# Patient Record
Sex: Female | Born: 1935 | Race: Black or African American | Hispanic: No | Marital: Married | Smoking: Former smoker
Health system: Southern US, Community
[De-identification: ages and names within clinical notes are randomized; demographics above are authoritative.]

## PROBLEM LIST (undated history)

## (undated) DIAGNOSIS — C259 Malignant neoplasm of pancreas, unspecified: Secondary | ICD-10-CM

## (undated) DIAGNOSIS — K831 Obstruction of bile duct: Secondary | ICD-10-CM

## (undated) DIAGNOSIS — E079 Disorder of thyroid, unspecified: Secondary | ICD-10-CM

## (undated) DIAGNOSIS — E785 Hyperlipidemia, unspecified: Secondary | ICD-10-CM

## (undated) DIAGNOSIS — C801 Malignant (primary) neoplasm, unspecified: Secondary | ICD-10-CM

## (undated) DIAGNOSIS — M1990 Unspecified osteoarthritis, unspecified site: Secondary | ICD-10-CM

## (undated) DIAGNOSIS — K859 Acute pancreatitis without necrosis or infection, unspecified: Secondary | ICD-10-CM

## (undated) DIAGNOSIS — R011 Cardiac murmur, unspecified: Secondary | ICD-10-CM

## (undated) DIAGNOSIS — J45909 Unspecified asthma, uncomplicated: Secondary | ICD-10-CM

## (undated) DIAGNOSIS — I10 Essential (primary) hypertension: Secondary | ICD-10-CM

## (undated) DIAGNOSIS — K219 Gastro-esophageal reflux disease without esophagitis: Secondary | ICD-10-CM

## (undated) HISTORY — DX: Cardiac murmur, unspecified: R01.1

## (undated) HISTORY — DX: Essential (primary) hypertension: I10

## (undated) HISTORY — DX: Unspecified osteoarthritis, unspecified site: M19.90

## (undated) HISTORY — PX: CATARACT EXTRACTION: SUR2

## (undated) HISTORY — DX: Gastro-esophageal reflux disease without esophagitis: K21.9

## (undated) HISTORY — DX: Disorder of thyroid, unspecified: E07.9

## (undated) HISTORY — PX: OTHER SURGICAL HISTORY: SHX169

## (undated) HISTORY — DX: Hyperlipidemia, unspecified: E78.5

## (undated) HISTORY — DX: Malignant (primary) neoplasm, unspecified: C80.1

## (undated) HISTORY — PX: CHOLECYSTECTOMY: SHX55

## (undated) HISTORY — PX: ABDOMINAL HYSTERECTOMY: SHX81

## (undated) HISTORY — PX: NECK SURGERY: SHX720

---

## 1999-04-13 ENCOUNTER — Emergency Department (HOSPITAL_COMMUNITY): Admission: EM | Admit: 1999-04-13 | Discharge: 1999-04-13 | Payer: Self-pay | Admitting: Emergency Medicine

## 2000-11-04 ENCOUNTER — Ambulatory Visit (HOSPITAL_COMMUNITY): Admission: RE | Admit: 2000-11-04 | Discharge: 2000-11-04 | Payer: Self-pay | Admitting: Gastroenterology

## 2000-11-04 ENCOUNTER — Encounter: Payer: Self-pay | Admitting: Gastroenterology

## 2000-11-08 ENCOUNTER — Other Ambulatory Visit: Admission: RE | Admit: 2000-11-08 | Discharge: 2000-11-08 | Payer: Self-pay | Admitting: Gastroenterology

## 2000-11-08 ENCOUNTER — Encounter (INDEPENDENT_AMBULATORY_CARE_PROVIDER_SITE_OTHER): Payer: Self-pay

## 2000-11-09 ENCOUNTER — Encounter: Payer: Self-pay | Admitting: Gastroenterology

## 2000-11-09 ENCOUNTER — Ambulatory Visit (HOSPITAL_COMMUNITY): Admission: RE | Admit: 2000-11-09 | Discharge: 2000-11-09 | Payer: Self-pay | Admitting: Gastroenterology

## 2000-12-17 ENCOUNTER — Emergency Department (HOSPITAL_COMMUNITY): Admission: EM | Admit: 2000-12-17 | Discharge: 2000-12-17 | Payer: Self-pay | Admitting: Emergency Medicine

## 2000-12-17 ENCOUNTER — Encounter: Payer: Self-pay | Admitting: Family Medicine

## 2001-12-02 ENCOUNTER — Ambulatory Visit (HOSPITAL_COMMUNITY): Admission: RE | Admit: 2001-12-02 | Discharge: 2001-12-02 | Payer: Self-pay | Admitting: Gastroenterology

## 2001-12-09 ENCOUNTER — Ambulatory Visit (HOSPITAL_COMMUNITY): Admission: RE | Admit: 2001-12-09 | Discharge: 2001-12-09 | Payer: Self-pay | Admitting: Gastroenterology

## 2001-12-09 ENCOUNTER — Encounter: Payer: Self-pay | Admitting: Gastroenterology

## 2003-02-21 ENCOUNTER — Ambulatory Visit: Admission: RE | Admit: 2003-02-21 | Discharge: 2003-02-21 | Payer: Self-pay | Admitting: Family Medicine

## 2003-07-23 ENCOUNTER — Encounter: Payer: Self-pay | Admitting: Neurosurgery

## 2003-07-23 ENCOUNTER — Encounter: Admission: RE | Admit: 2003-07-23 | Discharge: 2003-07-23 | Payer: Self-pay | Admitting: Neurosurgery

## 2003-08-02 ENCOUNTER — Inpatient Hospital Stay (HOSPITAL_COMMUNITY): Admission: RE | Admit: 2003-08-02 | Discharge: 2003-08-04 | Payer: Self-pay | Admitting: Neurosurgery

## 2003-09-10 ENCOUNTER — Encounter (INDEPENDENT_AMBULATORY_CARE_PROVIDER_SITE_OTHER): Payer: Self-pay | Admitting: *Deleted

## 2003-09-10 ENCOUNTER — Ambulatory Visit (HOSPITAL_COMMUNITY): Admission: RE | Admit: 2003-09-10 | Discharge: 2003-09-10 | Payer: Self-pay | Admitting: Internal Medicine

## 2004-01-07 ENCOUNTER — Encounter: Admission: RE | Admit: 2004-01-07 | Discharge: 2004-01-07 | Payer: Self-pay | Admitting: Internal Medicine

## 2005-02-27 ENCOUNTER — Encounter: Admission: RE | Admit: 2005-02-27 | Discharge: 2005-02-27 | Payer: Self-pay | Admitting: Endocrinology

## 2005-09-24 ENCOUNTER — Encounter: Admission: RE | Admit: 2005-09-24 | Discharge: 2005-09-24 | Payer: Self-pay | Admitting: Internal Medicine

## 2005-11-30 ENCOUNTER — Emergency Department (HOSPITAL_COMMUNITY): Admission: EM | Admit: 2005-11-30 | Discharge: 2005-11-30 | Payer: Self-pay | Admitting: Emergency Medicine

## 2006-05-13 ENCOUNTER — Inpatient Hospital Stay (HOSPITAL_COMMUNITY): Admission: RE | Admit: 2006-05-13 | Discharge: 2006-05-17 | Payer: Self-pay | Admitting: Neurosurgery

## 2007-01-07 ENCOUNTER — Inpatient Hospital Stay (HOSPITAL_COMMUNITY): Admission: AD | Admit: 2007-01-07 | Discharge: 2007-01-10 | Payer: Self-pay | Admitting: Internal Medicine

## 2007-01-08 ENCOUNTER — Encounter (INDEPENDENT_AMBULATORY_CARE_PROVIDER_SITE_OTHER): Payer: Self-pay | Admitting: Specialist

## 2007-03-03 ENCOUNTER — Encounter: Admission: RE | Admit: 2007-03-03 | Discharge: 2007-03-03 | Payer: Self-pay | Admitting: Internal Medicine

## 2007-03-09 ENCOUNTER — Encounter: Admission: RE | Admit: 2007-03-09 | Discharge: 2007-03-09 | Payer: Self-pay | Admitting: Internal Medicine

## 2007-07-26 ENCOUNTER — Encounter: Admission: RE | Admit: 2007-07-26 | Discharge: 2007-10-24 | Payer: Self-pay | Admitting: Internal Medicine

## 2008-01-03 ENCOUNTER — Emergency Department (HOSPITAL_COMMUNITY): Admission: EM | Admit: 2008-01-03 | Discharge: 2008-01-03 | Payer: Self-pay | Admitting: Emergency Medicine

## 2008-03-25 ENCOUNTER — Encounter: Admission: RE | Admit: 2008-03-25 | Discharge: 2008-03-25 | Payer: Self-pay | Admitting: Neurosurgery

## 2008-07-05 ENCOUNTER — Encounter: Admission: RE | Admit: 2008-07-05 | Discharge: 2008-07-05 | Payer: Self-pay | Admitting: Internal Medicine

## 2008-10-05 HISTORY — PX: HAND SURGERY: SHX662

## 2009-03-07 ENCOUNTER — Ambulatory Visit: Payer: Self-pay | Admitting: Surgery

## 2009-05-16 ENCOUNTER — Ambulatory Visit: Payer: Self-pay | Admitting: Surgery

## 2009-05-26 ENCOUNTER — Encounter: Admission: RE | Admit: 2009-05-26 | Discharge: 2009-05-26 | Payer: Self-pay | Admitting: Orthopedic Surgery

## 2009-06-06 ENCOUNTER — Ambulatory Visit (HOSPITAL_BASED_OUTPATIENT_CLINIC_OR_DEPARTMENT_OTHER): Admission: RE | Admit: 2009-06-06 | Discharge: 2009-06-06 | Payer: Self-pay | Admitting: Orthopedic Surgery

## 2009-08-01 ENCOUNTER — Encounter: Admission: RE | Admit: 2009-08-01 | Discharge: 2009-08-01 | Payer: Self-pay | Admitting: Internal Medicine

## 2009-11-14 ENCOUNTER — Encounter: Admission: RE | Admit: 2009-11-14 | Discharge: 2009-11-14 | Payer: Self-pay | Admitting: Surgery

## 2009-11-14 ENCOUNTER — Other Ambulatory Visit: Admission: RE | Admit: 2009-11-14 | Discharge: 2009-11-14 | Payer: Self-pay | Admitting: Interventional Radiology

## 2010-03-06 ENCOUNTER — Emergency Department (HOSPITAL_COMMUNITY): Admission: EM | Admit: 2010-03-06 | Discharge: 2010-03-06 | Payer: Self-pay | Admitting: Emergency Medicine

## 2010-06-02 ENCOUNTER — Encounter: Admission: RE | Admit: 2010-06-02 | Discharge: 2010-06-02 | Payer: Self-pay | Admitting: Surgery

## 2010-06-13 ENCOUNTER — Encounter: Admission: RE | Admit: 2010-06-13 | Discharge: 2010-06-13 | Payer: Self-pay | Admitting: Gastroenterology

## 2010-08-04 ENCOUNTER — Encounter: Admission: RE | Admit: 2010-08-04 | Discharge: 2010-08-04 | Payer: Self-pay | Admitting: Internal Medicine

## 2010-10-26 ENCOUNTER — Encounter: Payer: Self-pay | Admitting: Internal Medicine

## 2011-01-09 LAB — POCT HEMOGLOBIN-HEMACUE: Hemoglobin: 12.5 g/dL (ref 12.0–15.0)

## 2011-01-10 LAB — BASIC METABOLIC PANEL
BUN: 17 mg/dL (ref 6–23)
CO2: 28 mEq/L (ref 19–32)
Calcium: 9.1 mg/dL (ref 8.4–10.5)
Chloride: 106 mEq/L (ref 96–112)
Creatinine, Ser: 1.04 mg/dL (ref 0.4–1.2)
GFR calc Af Amer: >60 mL/min (ref >60)
GFR calc non Af Amer: 52 mL/min — ABNORMAL LOW (ref >60)
Glucose, Bld: 99 mg/dL (ref 70–99)
Potassium: 3.6 mEq/L (ref 3.5–5.1)
Sodium: 141 mEq/L (ref 135–145)

## 2011-02-17 NOTE — Procedures (Signed)
DUPLEX DEEP VENOUS EXAM - LOWER EXTREMITY   INDICATION:  Left lower extremity pain with localized lump.   HISTORY:  Edema:  Yes.  Trauma/Surgery:  No.  Pain:  Yes.  PE:  No.  Previous DVT:  No.  Anticoagulants:  Other:   DUPLEX EXAM:                CFV   SFV   PopV  PTV    GSV                R  L  R  L  R  L  R   L  R  L  Thrombosis    0  0     0     0      0     0  Spontaneous   +  +  +  +  +  +  +   +  +  +  Phasic        +  +  +  +  +  +  +   +  +  +  Augmentation  +  +  +  +  +  +  +   +  +  +  Compressible  +  +  +  +  +  +  +   +  +  +  Competent     +  +  +  +  +  +  +   +  +  +   Legend:  + - yes  o - no  p - partial  D - decreased    IMPRESSION:  1. No evidence of DVT noted in the left leg.  2. Questionable cyst that measured 1.68 cm x 2.24 cm noted in the      posterior aspect of the left medial malleolus.  3. Notify nurse with results.       _____________________________  V. Charlena Cross, MD   MG/MEDQ  D:  03/07/2009  T:  03/07/2009  Job:  [id number]

## 2011-02-17 NOTE — Op Note (Signed)
NAME:  Carolyn [name], Carolyn [name]                ACCOUNT NO.:  [id number]   MEDICAL RECORD NO.:  [id number]          PATIENT TYPE:  AMB   LOCATION:  DSC                          FACILITY:  [facility]   PHYSICIAN:  Carolyn [name], M.D. DATE OF BIRTH:  [birth year]   DATE OF PROCEDURE:  06/06/2009  DATE OF DISCHARGE:                               OPERATIVE REPORT   PREOPERATIVE DIAGNOSES:  Chronic entrapment neuropathy, left median  nerve carpal tunnel.   POSTOPERATIVE DIAGNOSES:  Chronic entrapment neuropathy, left median  nerve carpal tunnel.   OPERATION:  Release of left transverse carpal ligament.   OPERATIONS:  Carolyn Fitch. Sypher, MD   ASSISTANT:  Carolyn Reeks Dasnoit, PA-C   ANESTHESIA:  General by LMA.   SUPERVISING ANESTHESIOLOGIST:  Carolyn Hora. Gelene Mink, MD   INDICATIONS:  Carolyn [name] is a 75 year old woman referred through the  courtesy of Dr. Creola [name] for evaluation of left arm pain, swelling,  and numbness.  Clinical examination revealed signs of a rotator cuff  syndrome, chronic edema of the arm, and probable carpal tunnel syndrome.   She has been thoroughly evaluated by Dr. Timothy [name] and has multiple medical  problems that are under his able management.  She was sent for a Doppler  study of her left upper extremity to rule out possible axillary vein  thrombosis.  This study was negative for signs of venous outlet  obstruction.  Dr. Timothy [name] and I had a lengthy discussion regarding her  continued arm swelling and today we have as yet not identified a clear  syndrome to explain this other than possible chronic carpal tunnel  syndrome with a neuropathic response leading to edema.  She was studied  by Dr. Floreen [name] with electrodiagnostic studies which did confirm  significant left carpal tunnel syndrome.   Due to failed respond to nonoperative measures, she is brought to the  operating at this time for release of her left transverse carpal  ligament.   PROCEDURE:  Carolyn [name] was brought to  the operating room and placed in  the supine position upon the operating table.   Following the induction of general anesthesia by LMA technique, the left  arm was prepped with Betadine soap and solution, sterilely draped.  A  pneumatic tourniquet was applied to proximal left brachium.  Following  exsanguination, the left arm with Esmarch bandage, arterial tourniquet  was inflated to 250 mmHg.  The procedure commenced with a short incision  in the line of the ring finger in the palm.  Subcutaneous tissues were  carefully divided revealing the palmar fascia.  This was split  longitudinally to reveal the common sensory branch of the median nerve.  These were followed back to the median nerve proper which was gently  isolated from the transverse carpal ligament with a Biochemist, clinical.  The transcarpal ligament was then released along its ulnar  border extending into the distal forearm.  This widely opened the carpal  canal.  No mass or other predicaments were noted.   Bleeding points along the margin of the released ligament were  electrocauterized with bipolar current  followed by repair of the skin  with intradermal 3-0 Prolene.   A compressive dressing was applied with a volar plaster splint  maintaining the wrist in 5 degrees of dorsiflexion.   For aftercare, Carolyn [name] is provided a prescription for Percocet 5 mg 1  p.o. q.4-6 h. p.r.n. pain 20 tablets without refill.  She will return to  see [place] in the office for followup in roughly 8 days for suture removal  and advancement to a therapy program.      Carolyn [name], M.D.  Electronically Signed     RVS/MEDQ  D:  06/06/2009  T:  06/06/2009  Job:  [id number]   cc:   Carolyn Pounds, MD

## 2011-02-17 NOTE — Procedures (Signed)
DUPLEX DEEP VENOUS EXAM - UPPER EXTREMITY   INDICATION:  Left arm pain and swelling.   HISTORY:  Edema:  Yes.  Trauma/Surgery:  No.  Pain:  Yes.  PE:  No.  Previous DVT:  No.  Anticoagulants:  None.  Other:   DUPLEX EXAM:                                             Bas/                IJV   SCV     AXV    BrachV  Ceph V                R  L  R   L   R  L   R   L   R  L  Thrombosis    0  0      0      0       0      0  Spontaneous      +      +      +       +      +  Phasic           +      +      +       +      +  Augmentation     +      +      +       +      +  Compressible     +      +      +       +      +  Competent        +      +      +       +      +  Legend:  + - yes  o - no  p - partial  D - decreased   IMPRESSION:  1. No evidence of DVT noted in the left arm.  2. Notified of Dr. Timothy Lasso with results.   ___________________________________________  V. Charlena Cross, MD   MG/MEDQ  D:  05/16/2009  T:  05/16/2009  Job:  [id number]

## 2011-02-20 NOTE — Discharge Summary (Signed)
NAME:  Carolyn [name], Carolyn [name]                ACCOUNT NO.:  [id number]   MEDICAL RECORD NO.:  [id number]          PATIENT TYPE:  INP   LOCATION:  3010                         FACILITY:  [facility]   PHYSICIAN:  Payton Doughty, M.D.      DATE OF BIRTH:  Feb 12, 1936   DATE OF ADMISSION:  05/13/2006  DATE OF DISCHARGE:  05/17/2006                                 DISCHARGE SUMMARY   ADMITTING DIAGNOSIS:  Cervical spondylosis.   DISCHARGE DIAGNOSIS:  Cervical spondylosis.   PROCEDURE:  C3 to C7 posterior cervical laminectomy and fusion.   DICTATING DOCTOR:  Dr. Channing Mutters.   HOSPITAL COURSE:  This is a 75 year old right-hand black lady whose history  and physical is recounted in the chart by Dr. Venetia Maxon.  She had an anterior  decompression done C4/5, had degenerative disease and progressive  myelopathy.  She was admitted after ascertaining normal laboratory values  and underwent a posterior cervical decompression and fusion C3 through C7.  Postoperatively,  she did reasonably well.  She had a drain placed and was  kept down in an ACU.  Her strength has been forcing a little bit of numbness  in her hands which I believe will resolve.  There is no evidence of a  Hoffman's.  The second postoperative day her drain was removed.  Third  postoperative day she was transferred to 3000.  She is up, eating and  voiding normally, independent in her activities of daily living.  She is  being discharged home in the care of her family with Percocet for pain,  Valium for spasm.  Her follow up will be in the [facility] office in a week.           ______________________________  Payton Doughty, M.D.     MWR/MEDQ  D:  05/17/2006  T:  05/17/2006  Job:  [id number]

## 2011-02-20 NOTE — Op Note (Signed)
NAME:  Carolyn [name], Carolyn [name]                ACCOUNT NO.:  [id number]   MEDICAL RECORD NO.:  [id number]          PATIENT TYPE:  INP   LOCATION:  5007                         FACILITY:  [facility]   PHYSICIAN:  John C. Madilyn Fireman, M.D.    DATE OF BIRTH:  [birth year]   DATE OF PROCEDURE:  01/08/2007  DATE OF DISCHARGE:                               OPERATIVE REPORT   PROCEDURE:  Esophagogastroduodenoscopy with biopsy.   INDICATIONS FOR PROCEDURE:  Epigastric abdominal pain, etiology unclear;  nonspecific elevation of amylase and lipase in a patient with previous  history of cholecystectomy and no abnormalities on abdominal CT scan.   PROCEDURE:  The patient was placed in the left lateral decubitus  position and placed on the pulse monitor with continuous low-flow oxygen  delivered by nasal cannula.  She was sedated 50 mcg IV fentanyl and 5 mg  IV Versed.  The Olympus video endoscope was advanced under direct vision  into the oropharynx and esophagus.  The esophagus was straight and of  normal caliber, with the squamocolumnar line at 38 cm.  There was 1 to 2-  cm small hiatal hernia without any visible inflammation, stricture, or  ring.  The stomach was entered, and a small amount of liquid secretions  were suctioned from the fundus.  Retroflexed view of the cardia  confirmed a hiatal hernia but was otherwise unremarkable.  The fundus  appeared normal.  In the proximal body along the greater curve, there  was seen an oblong, ovoid, 8-mm polypoid nodule that was cold biopsied.  The remainder of the stomach appeared normal.  The pylorus was  nondeformed and easily allowed passage of the endoscope tip in the  duodenum.  In the distal bulb there was an approximately 6-mm protruding  polypoid structure, relatively smooth-surfaced, which was biopsied and  sent in a separate specimen container.  This was felt possibly to  represent hyperplastic or adenomatous polyp or possibly ectopic pancreas  or Brunner's gland  hyperplasia.  The postbulbar duodenum appeared  normal.  The scope was then withdrawn.   The patient was returned to the recovery room in stable condition.  She  tolerated the procedure well.  There were no immediate complications.   IMPRESSION:  1. Gastric and duodenal nodule.  2. Hiatal hernia.  3. No evidence of peptic ulcer disease.   PLAN:  Will obtain a fasting lipid profile to rule out  hypertriglyceridemia as a possible cause of low-grade pancreatitis.           ______________________________  Everardo All. Madilyn Fireman, M.D.     JCH/MEDQ  D:  01/08/2007  T:  01/08/2007  Job:  [id number]   cc:   Gwen Pounds, MD

## 2011-02-20 NOTE — Consult Note (Signed)
NAME:  Carolyn [name], Carolyn [name]                ACCOUNT NO.:  [id number]   MEDICAL RECORD NO.:  [id number]          PATIENT TYPE:  INP   LOCATION:  5007                         FACILITY:  [facility]   PHYSICIAN:  John C. Madilyn Fireman, M.D.    DATE OF BIRTH:  [birth year]   DATE OF CONSULTATION:  01/07/2007  DATE OF DISCHARGE:                                 CONSULTATION   GASTROENTEROLOGY CONSULTATION   REASON FOR CONSULTATION:  Abdominal pain.   HISTORY OF ILLNESS:  The patient is a 75 year old, black female, who  presents with a three-week history of gradually worsening epigastric,  burning, abdominal pain despite being on Protonix and unrelieved by  taking more Zantac and antacids.  It is worse after eating, and she has  lost about 5 pounds of weight during this time.  She denies any nausea  or vomiting, black stools, fever, dysphagia, or heartburn, and she  denies using nonsteroidal antiinflammatory drugs.  She has a remote  history of cholecystectomy in the 1980s.  She does not drink alcohol.  She has not yet had any lab work or radiologic studies.  She describes  having an EGD done at sometime in the past by the [facility], but I  do not have the results.   PAST MEDICAL HISTORY:  1. Gastroesophageal reflux.  2. History of hypertension.  3. Cervical degenerative joint disease.  4. History of gallstones.  5. History of hyperlipidemia.  6. Multinodular goiter.  7. History of colon polyps.   MEDICATIONS:  Protonix, Vytorin, Lisinopril, Verapamil.   PAST SURGICAL HISTORY:  1. Pilonidal cyst removal.  2. Hysterectomy.  3. Cholecystectomy.  4. Cervical spinal fusion x2.   ALLERGIES:  None.   PHYSICAL EXAMINATION:  GENERAL:  A well-developed, well-nourished, black  female in no acute distress.  HEENT:  Unremarkable.  No obvious scleral icterus.  HEART:  Regular rate and rhythm without murmur.  LUNGS:  Clear.  ABDOMEN:  Soft and nondistended with normoactive bowel sounds.  No  hepatosplenomegaly, masses, or guarding.  There is mild epigastric  abdominal tenderness.   IMPRESSION:  Epigastric abdominal pain.  Unclear whether pancreatitis,  peptic ulcer disease, possible common bile duct stone, or other  hepatobiliary etiology.   PLAN:  I will begin workup with routine lab work including liver  function tests, amylase, lipase, as well as abdominal CT scan.  If these  are unrevealing, the next step would be EGD.           ______________________________  Everardo All. Madilyn Fireman, M.D.     JCH/MEDQ  D:  01/07/2007  T:  01/07/2007  Job:  [id number]

## 2011-02-20 NOTE — Discharge Summary (Signed)
NAME:  Carolyn [name], Carolyn [name] NO.:  [id number]   MEDICAL RECORD NO.:  [id number]          PATIENT TYPE:  INP   LOCATION:  5007                         FACILITY:  [facility]   PHYSICIAN:  Gwen Pounds, MD       DATE OF BIRTH:  07/23/1936   DATE OF ADMISSION:  01/07/2007  DATE OF DISCHARGE:  01/10/2007                               DISCHARGE SUMMARY   PRIMARY CARE [facility]:  Gwen Pounds, M.D.   GASTROENTEROLOGY:  Everardo All. Madilyn Fireman, M.D.   DISCHARGE DIAGNOSES:  1. Mild pancreatitis.  2. Severe abdominal pain.  3. Weight loss and anorexia.  4. Gastroesophageal reflux disease.  5. History of multinodular goiter.  6. Known carotid bruit with 41-59% left internal carotid artery      stenosis.  7. History of pilonidal cyst removal.  8. Hysterectomy secondary to fibroids.  9. History of bilateral carpal tunnel syndrome surgery.  10.Cholecystectomy in 1990 secondary to stones; this was done      laparoscopically.  11.Cervical C4-5 laminectomy in October 2004; C3-7 fusion in August      2007.  12.Hypertension.  13.Hyperlipidemia.  14.History of colon polypectomy.   DISCHARGE MEDICATION LIST:  1. Protonix 40 mg p.o. daily.  2. Vytorin 10/40 daily.  3. Lisinopril/hydrochlorothiazide 20/25 b.i.d.  4. Verapamil SR 120 daily.  5. Vicodin 5/500 one to two tablets p.o. q.6h. p.r.n. #40.   DISCHARGE DIET:  Small meals.  Can increase to three to five small meals  per day until she is feeling better.  If she gets constipated she is to  add stool softeners, MiraLax or Milk of Magnesia at this point.  Otherwise, maintain a low-fat diet, heart healthy into the future.   DISCHARGE PROCEDURES:  1. Esophagogastroduodenoscopy with biopsy revealing gastric and      duodenal nodule, hiatal hernia, and no evidence of peptic ulcer      disease.  Biopsy reports reveal hyperplastic pyloric-type mucosa      and benign fundic gland polyp.  2. Medical management and GI consultation.   HISTORY OF  PRESENT ILLNESS:  Briefly, [name] is a 75 year old  female with known history of GERD, history of polypectomy, and history  of laparoscopic cholecystectomy who presented to my office on January 07, 2007, with 3 weeks of severe epigastric and right upper quadrant pains.  When I went in to evaluate her, she was rolled up in a ball to relieve  the pain and the pressure.  The pain and pressure were so great that she  had not been sleeping, she was in tears.  She had been taking extra  doses of Protonix, Zantac, and Pepcid trying to alleviate some of the GI  issues that she had been having.  She denied any nausea, vomiting or  diarrhea but was anorexic and was not eating due to lack of hunger.  She  had the feeling that she had to belch but could not.  She did not have  any bloody bowel movements or melena.  On exam, she had 10/10 pain.  She  had lost  5 pounds in the last month to two months.  Her abdominal exam  was remarkably unremarkable.  She was soft, nontender, nondistended but  she did have some decreased bowel sounds and had some mild rebound  without any guarding.  Her laparoscopic scars were noted.   HOSPITAL COURSE:  Overall, this was 75 year old female with epigastric  and right upper quadrant pain admitted from my office to rule out  pancreatitis, choledocholithiasis, esophagitis versus gastritis versus  peptic ulcer disease versus other abdominal pathology.  It was unclear  as to what the problem was when she was in the office but it was clear  that she needed inpatient admission based on her level of pain.  The  plan from my office was to admit, give IV fluids, consult GI, get a full  set of labs, get an abdominal and pelvic CT, hold her home medications,  place her on squeezers for deep venous prophylaxis, and gain pain  control.  I consulted Dr. Madilyn Fireman from GI for he had seen her in the past  for her endoscopies.  Dr. Madilyn Fireman saw her on January 07, 2007, and felt that  her  epigastric abdominal pain was unclear.  He felt that the  differential diagnoses include pancreatitis, peptic ulcer disease,  common bile duct stone, or some other hepatobiliary etiology as I had  already said.  He agreed with checking liver function tests, amylase,  lipase, abdominal CT scan.  If these were unrevealing, the next step  would be EGD.  The following day, she was seen and evaluated by internal  medicine, one of my partners, Dr. Wylene Simmer.  Her amylase was 200, her  lipase was 56, so she was ruled in for mild pancreatitis.  Her IV fluids  were continued and nothing by mouth as far as food was also continued,  and GI had scheduled her for later in the day for an endoscopy.  Endoscopy showed a nodule.  This was biopsied and later turned out to be  nothing and she had a small hiatal hernia and nothing else that was  seen.  CT scan was eventually obtained which showed no acute findings in  the abdomen, multiple hepatic cysts, mild aneurysmal diltation of the  distal descending thoracic aorta at 2.8 cm to 3.3. cm.  There were no  acute findings in the pelvis.  On followup on January 09, 2007, her amylase  and lipase started drifting down.  Her amylase was 149, her lipase was  32, the rest of her labs were unremarkable.  Her blood pressure was  controlled and she was starting to feel a lot better.  GI notes that she  had a low-grade pancreatitis of unclear etiology and appears to be  improving, and recommended to start gradually advancing her diet and  follow up on the biopsy of her nodule.  I saw her the following morning  and reviewed everything including her negative workup including the CT  scans.  Her triglyceride was normal at 89 and endoscopy was as listed  above.  Her biopsies came back negative.  She has been walking, she was  eating, her vital signs were stable, and because she was tolerating her meals, although small, it was felt okay to discharge on narcotics with  close  followup.  GI saw her later that day and said that they agreed  with discharge if the patient continues to tolerate her diet and follow  up accordingly.  She was allowed to go home  later that afternoon.  She  did tolerate her diet and she was told to follow up with me in 1-2 weeks  as an outpatient.  She was discharged home in stable condition.      Gwen Pounds, MD  Electronically Signed     JMR/MEDQ  D:  02/15/2007  T:  02/15/2007  Job:  [id number]

## 2011-02-20 NOTE — H&P (Signed)
NAME:  Carolyn [name], Carolyn [name] NO.:  [id number]   MEDICAL RECORD NO.:  [id number]          PATIENT TYPE:  INP   LOCATION:  5007                         FACILITY:  [facility]   PHYSICIAN:  Gwen Pounds, MD       DATE OF BIRTH:  [birth year]   DATE OF ADMISSION:  01/07/2007  DATE OF DISCHARGE:                              HISTORY & PHYSICAL   CHIEF COMPLAINT:  Abdominal pain, severe x3 weeks, weight loss,  anorexia.   HISTORY OF PRESENT ILLNESS:  This is a 75 year old female with GERD,  history of polypectomy, and history of laparoscopic cholecystectomy, who  presents with 3 weeks of severe epigastric and right upper quadrant  pains.  Patient rolled up in a ball to relieve the pain and pressure.  She has not been sleeping.  She has been in tears and she has been  pushing Protonix, Zantac, and Pepcid.  She denies nausea or vomiting or  diarrhea but has not been able to eat although hungry.  She wants to  belch but cannot.  She denies any melena.  She has had some changes in  her bowel movements.  Will admit for further evaluation and treatment.   PAST MEDICAL HISTORY:  Cervical C4,5 laminectomy in October of 04, C3,7  effusion in August of 2007, hypertension, hyperlipidemia, GERD,  multinodular goiter, carotid bruit with a left ICA 41-59% stenosis,  pilonidal cyst removal, hysterectomy secondary to fibroids, bilateral  carpal tunnel syndrome, cholecystectomy around 1990, laparoscopically  secondary to stones and history of polypectomy.   ALLERGIES:  ALKA-SELTZER ALTHOUGH SHE CAN TOLERATE ASPIRIN.   MEDICATIONS:  1. Protonix 40 q. day.  2. Vytorin 10/40 q. day.  3. Lisinopril 20/25 b.i.d.  4. Verapamil SR 120 q. day.   SOCIAL HISTORY:  She is married, has 2 children, she quit tobacco in  October of 2004, does not drink.   FAMILY HISTORY:  Coronary disease, congestive heart failure,  unfortunately her mother died of burn injuries.   REVIEW OF SYSTEMS:  CONSTITUTIONAL:   She denies any fevers, chills,  headaches, sore throat.  CARDIOVASCULAR:  Chest pain, shortness of  breath, cough, weakness.  ABDOMEN:  But has been having GERD-type  symptoms without any nausea, vomiting, diarrhea, blood in her stool,  melena, hematemesis, etcetera.  She has been anorexic and has lost about  5 pounds.  She said this is some of the worst pain that she has ever  had.   PHYSICAL EXAM:  VITAL SIGNS:  Temperature 98.1, heart rate 58,  respiratory rate is 16, blood pressure 148/62 with a weight of 207 which  is down 5 pounds since February and she has 10/10 pain.  GENERAL:  She is alert and oriented x3.  She is rocking and in a ball  trying to alleviate pain, holding tissues and near tears.  EAR, NOSE, AND THROAT:  PERRL.  EOMI.  Oropharynx:  Dry.  NECK:  There is no JVD.  CARDIAC:  Regular, no murmurs.  PULMONARY:  Clear to auscultation bilaterally.  ABDOMEN EXAM:  Soft, nontender, nondistended, decreased bowel sounds,  mild right upper quadrant rebound but no guarding.  Laparoscopic scars  noted, hysterectomy scars noted.  Overall, her abdominal exam is not as  bad as I would expect based on the level of pain that she is citing.  EXTREMITIES:  mild edema.   ASSESSMENT:  A 75 year old female with epigastric and right upper  quadrant pain.  Will admit to rule out pancreatitis, rule out  choledocholithiasis, rule out esophagitis versus gastritis versus peptic  ulcer disease or other abdominal pathology.   PLAN:  1. Admit.  2. Intravenous fluids.  3. Consult gastrointestinal.  4. Check labs.  5. Check abdominal and pelvic CT with oral and IV contrast.  6. Restart hypertensive meds in the morning.  7. We will hold on statins.  8. We will place squeezers for deep vein thrombosis prophylaxis.  9. We will attempt pain control with Dilaudid.  10.Dr. Madilyn Fireman has been called for a consult and been talked to on the      phone and is aware of the patient's admission.       Gwen Pounds, MD  Electronically Signed     JMR/MEDQ  D:  01/07/2007  T:  01/09/2007  Job:  [id number]   cc:   Everardo All. Madilyn Fireman, M.D.

## 2011-02-20 NOTE — Op Note (Signed)
NAME:  Carolyn [name], Carolyn [name]                          ACCOUNT NO.:  [id number]   MEDICAL RECORD NO.:  [id number]                   PATIENT TYPE:  INP   LOCATION:  5705                                 FACILITY:  [facility]   PHYSICIAN:  Danae Orleans. Venetia Maxon, M.D.               DATE OF BIRTH:  [birth year]   DATE OF PROCEDURE:  08/02/2003  DATE OF DISCHARGE:                                 OPERATIVE REPORT   PREOPERATIVE DIAGNOSES:  Cervical stenosis with herniated cervical disk,  cervical myelopathy and spondylosis with cervical radiculopathy C4-5 level.   POSTOPERATIVE DIAGNOSES:  Cervical stenosis with herniated cervical disk,  cervical radiculopathy and spondylosis with cervical radiculopathy, C4-5  level.   PROCEDURE:  Anterior cervical decompression and fusion C4-5 with allograft  and anterior cervical plate.   SURGEON:  Danae Orleans. Venetia Maxon, M.D.   ANESTHESIA:  General endotracheal anesthesia.   ESTIMATED BLOOD LOSS:  Minimal.   COMPLICATIONS:  None.   DISPOSITION:  Recovery.   INDICATIONS FOR PROCEDURE:  Carolyn [name] is a 75 year old woman with  cervical myelopathy and severe spinal stenosis at C4-5 with cord signal in  the cervical spinal cord from 5 mm canal diameter at this level.  She has  deltoid weakness on the right.  It was elected to take her to surgery for  anterior cervical decompression and fusion at the affected level.   DESCRIPTION OF PROCEDURE:  Carolyn [name] is brought to the operating room.  Following the satisfactory and uncomplicated induction of general  endotracheal anesthesia and placement of intravenous lines, the patient was  placed in supine position on the operating table.  Her neck was placed in  neutral alignment.  Her anterior neck was then prepped and draped in the  usual sterile fashion.  The area of planned excision was infiltrated with  0.25% Marcaine and 0.5% lidocaine 1:200,000 epinephrine.  Incision was made  from the midline to the anterior border of the  sternocleidomastoid muscle  and what was felt to be the C4-5 level.  This incision was carried to the  platysmal layer.  Subplatysmal dissection was performed exposing the  anterior border of the sternocleidomastoid muscle.  Using blunt dissection  the carotid sheath was kept lateral, the trachea and esophagus kept medial  exposing the anterior cervical spine.  A bent spinal needle was placed at  what was felt to be the C4-5 level but because of the patient's large body  habitus it was not possible to visualize this level and consequently an  additional needle was placed through the C3-4 level and using pull down  technique with intraoperative x-ray it was possible to visualize this level.  The C4-5 level was then further cleared of tissue and the longus coli  muscles were taken down from the anterior cervical spine from C4-C5 levels  bilaterally using electrocautery and elevator.  A self-retaining Shadowline  retractor was placed to facilitate exposure.  The interspace was highly  degenerated.  There was a large ventral osteophyte and this was removed with  a Leksell rongeur.  The interspace was then incised with a 15 blade and disk  material was removed in a piecemeal fashion.  The disk space spreader was  placed and the end plates were decorticated and drilled down using Anspach  drill and A2 pulley #1 bur under direct microscopic visualization.  The  uncinate spurs were drilled down, and there was a central disk herniation  which was removed with pituitary rongeur.  The posterior longitudinal  ligament was then incised and removed in a piecemeal fashion resulting in  significant decompression of the central spinal cord dura.  Subsequently  after trial sizers were utilized, an 8 mm corticocancellous bone graft was  inserted, countersunk appropriately after this had been rehydrated.  A 24 mm  Trinica anterior cervical plate was then affixed to the anterior cervical  spine using 14 mm  variable angle screws, two at C4, two at C5.  All screws  had excellent purchase.  Locking mechanisms were engaged.  Final x-ray could  confirm the position of bone graft and anterior plate at this level.  It was  not possible to visualize the lower aspect of the construct again because to  the patient's large body habitus.  The wound was then copiously irrigated  with Bacitracin and saline.  Soft tissues were inspected and found to be in  good repair.  The platysmal layer was reapproximated with 3-0 Vicryl  sutures.  Subcutaneous tissues were reapproximated with 4-0 running Vicryl  subcuticular stitch.  The wound was dressed with Dermabond.  The patient was  extubated in the operating room and taken to the recovery room in stable and  satisfactory condition having tolerated the operation well.  Counts were  correct at the end of the case.                                               Danae Orleans. Venetia Maxon, M.D.    JDS/MEDQ  D:  08/02/2003  T:  08/03/2003  Job:  [id number]

## 2011-02-20 NOTE — Op Note (Signed)
NAME:  Carolyn [name], Carolyn [name]                ACCOUNT NO.:  [id number]   MEDICAL RECORD NO.:  [id number]          PATIENT TYPE:  INP   LOCATION:  3172                         FACILITY:  [facility]   PHYSICIAN:  Danae Orleans. Venetia Maxon, M.D.  DATE OF BIRTH:  21-Oct-1935   DATE OF PROCEDURE:  05/13/2006  DATE OF DISCHARGE:                                 OPERATIVE REPORT   PREOPERATIVE DIAGNOSIS:  Cervical stenosis, spondylosis, myelopathy and  herniated disk, C3 through C7 levels.   POSTOPERATIVE DIAGNOSIS:  Cervical stenosis, spondylosis, myelopathy and  herniated disk, C3 through C7 levels.   OPERATION PERFORMED:  Posterior cervical laminectomy and lateral mass  fusion, C3 through C7 levels with posterolateral arthrodesis utilizing  morcellized bone autograft and Osteocel.   SURGEON:  Danae Orleans. Venetia Maxon, M.D.   ASSISTANT:  Clydene Fake, M.D.   ANESTHESIA:  General endotracheal.   ESTIMATED BLOOD LOSS:  100 mL.   COMPLICATIONS:  None.   DISPOSITION:  Recovery.   INDICATIONS FOR PROCEDURE:  Carolyn [name] is a 75 year old woman with  cervical myelopathy.  She had previously undergone anterior cervical  decompression and fusion at the C4-5 and did well with this but had  significant degenerative disease at other levels and subsequently some time  later developed symptoms of progressive myelopathy.  It was therefore  elected to take her to surgery for posterior decompression and fusion of the  C3 through C7 levels.   DESCRIPTION OF PROCEDURE:  Carolyn [name] was brought to the operating room.  Following satisfactory and uncomplicated induction of general endotracheal  anesthesia and placement of intravenous lines, the patient was placed in 3-  pin head fixation and placed in the Aspen collar and turned carefully into  prone position and head was fixated in neutral alignment and intraoperative  x-ray demonstrated well positioned, well oriented neck.  Her shoulders were  taped down as were posterior  adipose folds to expose her upper cervical  spine.  Her posterior occiput was then shaved and her posterior occiput and  posterior neck were then prepped and draped in the usual sterile fashion.  The area of planned incision was infiltrated with 0.25% Marcaine and 0.5%  lidocaine 1:200,000 epinephrine.  Incision was made and carried through to  the C2 to C7 levels through copious adipose tissue.  Subperiosteal  dissection was performed exposing the spinous processes of C3,  C4, C5, C6  and C7 and the lateral masses of each of these levels.  Intraoperative x-ray  demonstrated marker probes at C3 and C4, subsequently lateral mass screws  were placed using Alphatec posterior screw and rod system with 10 mm screws  at C3, C4, C5, C6 and C7 bilaterally.  Screws were placed according to the  standard trajectories.  Laminectomy of C3 through C7 was then performed.  Prior to doing so, rods were lordosed and fit appropriately and anchored  with screw caps.  Prior to doing so, the facet joints were decorticated.  There appeared to be solid arthrodesis across the C4-5 facets.  The  posterior cortex was perforated along with facet joint capsules for later  bone grafting.  The midline bone which was removed for laminectomy and also  removed from spinous processes was then used for later bone grafting.  A  total laminectomy of C3 through C7 was performed with removal of  hypertrophied ligamentous tissue.  This resulted in significant  decompression of the central spinal cord dura and the spinal cord.  A  morcellized bone autograft and 5 cc of Osteocel were then placed in the  posterolateral region bilaterally, C3 through C7 levels and hemostasis was  assured.  The dura was seen to be pulsatile and significantly decompressed  with this surgery.  Both the cephalad and caudad extent of the laminectomy  demonstrated excellent decompression.  A #10 Jackson-Pratt drain was then  inserted although there  appeared to be excellent hemostasis.  The posterior  cervical fascia was then closed with 0 Vicryl sutures.  Subcutaneous tissues  were reapproximated with 2-0 Vicryl interrupted inverted sutures and skin  edges were reapproximated with interrupted 3-0 Vicryl subcuticular stitch.  The wound was dressed with benzoin and Steri-Strips, Telfa gauze and tape.  The patient was extubated in the operating room and taken to the recovery  room in stable and satisfactory condition having tolerated the operation  well.  Counts were correct at the end of the case.  She was placed in Aspen  collar at the end of the surgery.      Danae Orleans. Venetia Maxon, M.D.  Electronically Signed     JDS/MEDQ  D:  05/13/2006  T:  05/13/2006  Job:  [id number]

## 2011-03-12 ENCOUNTER — Encounter (INDEPENDENT_AMBULATORY_CARE_PROVIDER_SITE_OTHER): Payer: Self-pay | Admitting: Surgery

## 2011-05-22 ENCOUNTER — Other Ambulatory Visit: Payer: Self-pay | Admitting: Gastroenterology

## 2011-06-29 LAB — URINALYSIS, ROUTINE W REFLEX MICROSCOPIC
Glucose, UA: NEGATIVE
Nitrite: NEGATIVE
Protein, ur: NEGATIVE
Urobilinogen, UA: 1

## 2011-06-29 LAB — DIFFERENTIAL
Basophils Absolute: 0.1
Basophils Relative: 1
Monocytes Relative: 9
Neutro Abs: 6.9
Neutrophils Relative %: 55

## 2011-06-29 LAB — COMPREHENSIVE METABOLIC PANEL
Alkaline Phosphatase: 93
BUN: 7
Glucose, Bld: 84
Potassium: 3.5
Total Protein: 7.6

## 2011-06-29 LAB — CBC
HCT: 43.5
Hemoglobin: 14.3
MCHC: 32.8
MCV: 84.1
RBC: 5.18 — ABNORMAL HIGH
RDW: 14.4

## 2011-06-29 LAB — URINE CULTURE

## 2011-07-01 ENCOUNTER — Other Ambulatory Visit: Payer: Self-pay | Admitting: Internal Medicine

## 2011-07-01 DIAGNOSIS — Z1231 Encounter for screening mammogram for malignant neoplasm of breast: Secondary | ICD-10-CM

## 2011-08-06 ENCOUNTER — Ambulatory Visit
Admission: RE | Admit: 2011-08-06 | Discharge: 2011-08-06 | Disposition: A | Discharge status: Home / Self Care | Payer: Medicare Other | Source: Ambulatory Visit | Attending: Internal Medicine | Admitting: Internal Medicine

## 2011-08-06 DIAGNOSIS — Z1231 Encounter for screening mammogram for malignant neoplasm of breast: Secondary | ICD-10-CM

## 2012-04-22 ENCOUNTER — Telehealth (INDEPENDENT_AMBULATORY_CARE_PROVIDER_SITE_OTHER): Payer: Self-pay

## 2012-04-22 ENCOUNTER — Other Ambulatory Visit (INDEPENDENT_AMBULATORY_CARE_PROVIDER_SITE_OTHER): Payer: Self-pay

## 2012-04-22 ENCOUNTER — Encounter (INDEPENDENT_AMBULATORY_CARE_PROVIDER_SITE_OTHER): Payer: Self-pay

## 2012-04-22 DIAGNOSIS — E041 Nontoxic single thyroid nodule: Secondary | ICD-10-CM

## 2012-04-22 NOTE — Telephone Encounter (Signed)
Recall letter mailed to pt for ov and U/S at [facility] imaging prior to appt.

## 2012-06-14 ENCOUNTER — Ambulatory Visit
Admission: RE | Admit: 2012-06-14 | Discharge: 2012-06-14 | Disposition: A | Discharge status: Home / Self Care | Payer: Medicare Other | Source: Ambulatory Visit | Attending: Surgery | Admitting: Surgery

## 2012-06-14 DIAGNOSIS — E041 Nontoxic single thyroid nodule: Secondary | ICD-10-CM

## 2012-06-22 ENCOUNTER — Ambulatory Visit (INDEPENDENT_AMBULATORY_CARE_PROVIDER_SITE_OTHER): Payer: Medicare Other | Admitting: Surgery

## 2012-06-22 ENCOUNTER — Encounter (INDEPENDENT_AMBULATORY_CARE_PROVIDER_SITE_OTHER): Payer: Self-pay | Admitting: Surgery

## 2012-06-22 VITALS — BP 138/76 | HR 68 | Temp 97.6°F | Resp 16 | Ht 63.0 in | Wt 215.5 lb

## 2012-06-22 DIAGNOSIS — E042 Nontoxic multinodular goiter: Secondary | ICD-10-CM

## 2012-06-22 NOTE — Progress Notes (Signed)
General Surgery [facility]  Chief Complaint  Patient presents with  . Follow-up    multinodular goiter    HISTORY: Patient is a 76 year old black female followed in my practice since early 2011 with multinodular thyroid goiter. Clinically she has remained stable. She has undergone bilateral fine-needle aspiration biopsies in 2011, all of which were benign on final pathologic results. Patient was last evaluated in October 2011. At my request she underwent a followup thyroid ultrasound performed on 06/14/2012. This shows a moderate-sized thyroid goiter measuring 5.4 cm on the right and 7.1 cm on the left. There are multiple large bilateral nodules which have remained stable by ultrasound criteria over the past 2 years.  Patient denies any significant compressive symptoms. She has undergone previous anterior spine fusion and has had problems with changes in voice quality since that procedure.  She denies dysphagia. She denies any new palpable abnormalities.  Past Medical History  Diagnosis Date  . Hypertension   . Hyperlipidemia   . GERD (gastroesophageal reflux disease)      Current Outpatient Prescriptions  Medication Sig Dispense Refill  . ezetimibe-simvastatin (VYTORIN) 10-40 MG per tablet Take 1 tablet by mouth at bedtime.        . hydrochlorothiazide (HYDRODIURIL) 12.5 MG tablet Take 12.5 mg by mouth daily.        Marland Kitchen lisinopril (PRINIVIL,ZESTRIL) 40 MG tablet Take 40 mg by mouth daily.        . pantoprazole (PROTONIX) 40 MG tablet Take 40 mg by mouth daily.        . verapamil (CALAN-SR) 120 MG CR tablet Take 120 mg by mouth at bedtime.           Allergies  Allergen Reactions  . Other     ALKA     History reviewed. No pertinent family history.   History   Social History  . Marital Status: Married    Spouse Name: N/A    Number of Children: N/A  . Years of Education: N/A   Social History Main Topics  . Smoking status: Current Every Day Smoker  .  Smokeless tobacco: None   Comment: PACK A WEEK  . Alcohol Use: Yes     SOMETIMES  . Drug Use: None  . Sexually Active: None   Other Topics Concern  . None   Social History Narrative  . None     REVIEW OF SYSTEMS - PERTINENT POSITIVES ONLY: Denies tremors. Denies palpitations. Denies new masses. Denies pain.  EXAM: Filed Vitals:   06/22/12 1013  BP: 138/76  Pulse: 68  Temp: 97.6 F (36.4 C)  Resp: 16    HEENT: normocephalic; pupils equal and reactive; sclerae clear; dentition good; mucous membranes moist NECK:  Enlarged, soft thyroid gland with multiple bilateral nodules and thickening of the thyroid isthmus; relatively symmetric on extension; no palpable anterior or posterior cervical lymphadenopathy; no supraclavicular masses; no tenderness CHEST: clear to auscultation bilaterally without rales, rhonchi, or wheezes CARDIAC: regular rate and rhythm without significant murmur; peripheral pulses are full EXT:  non-tender without edema; no deformity NEURO: no gross focal deficits; no sign of tremor   LABORATORY RESULTS: See [facility] (CHL-Epic) for most recent results   RADIOLOGY RESULTS: See [facility] (CHL-Epic) for most recent results   IMPRESSION: Multinodular thyroid goiter, clinically stable  PLAN: Patient does not require surgical intervention at this time. I think it is quite safe to continue to monitor her thyroid goiter. We will repeat a thyroid ultrasound  and see her back for physical examination in 2 years. Her primary physician will continue to monitor her TSH level.  Velora Heckler, MD, FACS General & Endocrine Surgery [facility]   Visit Diagnoses: 1. Multinodular goiter (nontoxic)     Primary Care Physician: Gwen Pounds, MD

## 2012-06-22 NOTE — Patient Instructions (Signed)
Thyroid Diseases Your thyroid is a butterfly-shaped gland in your neck. It is located just above your collarbone. It is one of your endocrine glands, which make hormones. The thyroid helps set your metabolism. Metabolism is how your body gets energy from the foods you eat.  Millions of people have thyroid diseases. Women experience thyroid problems more often than men. In fact, overactive thyroid problems (hyperthyroidism) occur in 1% of all women. If you have a thyroid disease, your body may use energy more slowly or quickly than it should.  Thyroid problems also include an immune disease where your body reacts against your thyroid gland (called thyroiditis). A different problem involves lumps and bumps (called nodules) that develop in the gland. The nodules are usually, but not always, noncancerous. THE MOST COMMON THYROID PROBLEMS AND CAUSES ARE DISCUSSED BELOW There are many causes for thyroid problems. Treatment depends upon the exact diagnosis and includes trying to reset your body's metabolism to a normal rate. Hyperthyroidism Too much thyroid hormone from an overactive thyroid gland is called hyperthyroidism. In hyperthyroidism, the body's metabolism speeds up. One of the most frequent forms of hyperthyroidism is known as Graves' disease. Graves' disease tends to run in families. Although Graves' is thought to be caused by a problem with the immune system, the exact nature of the genetic problem is unknown. Hypothyroidism Too little thyroid hormone from an underactive thyroid gland is called hypothyroidism. In hypothyroidism, the body's metabolism is slowed. Several things can cause this condition. Most causes affect the thyroid gland directly and hurt its ability to make enough hormone.  Rarely, there may be a pituitary gland tumor (located near the base of the brain). The tumor can block the pituitary from producing thyroid-stimulating hormone (TSH). Your body makes TSH to stimulate the thyroid  to work properly. If the pituitary does not make enough TSH, the thyroid fails to make enough hormones needed for good health. Whether the problem is caused by thyroid conditions or by the pituitary gland, the result is that the thyroid is not making enough hormones. Hypothyroidism causes many physical and mental processes to become sluggish. The body consumes less oxygen and produces less body heat. Thyroid Nodules A thyroid nodule is a small swelling or lump in the thyroid gland. They are common. These nodules represent either a growth of thyroid tissue or a fluid-filled cyst. Both form a lump in the thyroid gland. Almost half of all people will have tiny thyroid nodules at some point in their lives. Typically, these are not noticeable until they become large and affect normal thyroid size. Larger nodules that are greater than a half inch across (about 1 centimeter) occur in about 5 percent of people. Although most nodules are not cancerous, people who have them should seek medical care to rule out cancer. Also, some thyroid nodules may produce too much thyroid hormone or become too large. Large nodules or a large gland can interfere with breathing or swallowing or may cause neck discomfort. Other problems Other thyroid problems include cancer and thyroiditis. Thyroiditis is a malfunction of the body's immune system. Normally, the immune system works to defend the body against infection and other problems. When the immune system is not working properly, it may mistakenly attack normal cells, tissues, and organs. Examples of autoimmune diseases are Hashimoto's thyroiditis (which causes low thyroid function) and Graves' disease (which causes excess thyroid function). SYMPTOMS  Symptoms vary greatly depending upon the exact type of problem with the thyroid. Hyperthyroidism-is when your thyroid is too   active and makes more thyroid hormone than your body needs. The most common cause is Graves' Disease. Too  much thyroid hormone can cause some or all of the following symptoms:  Anxiety.   Irritability.   Difficulty sleeping.   Fatigue.   A rapid or irregular heartbeat.   A fine tremor of your hands or fingers.   An increase in perspiration.   Sensitivity to heat.   Weight loss, despite normal food intake.   Brittle hair.   Enlargement of your thyroid gland (goiter).   Light menstrual periods.   Frequent bowel movements.  Graves' disease can specifically cause eye and skin problems. The skin problems involve reddening and swelling of the skin, often on your shins and on the top of your feet. Eye problems can include the following:  Excess tearing and sensation of grit or sand in either or both eyes.   Reddened or inflamed eyes.   Widening of the space between your eyelids.   Swelling of the lids and tissues around the eyes.   Light sensitivity.   Ulcers on the cornea.   Double vision.   Limited eye movements.   Blurred or reduced vision.  Hypothyroidism- is when your thyroid gland is not active enough. This is more common than hyperthyroidism. Symptoms can vary a lot depending of the severity of the hormone deficiency. Symptoms may develop over a long period of time and can include several of the following:  Fatigue.   Sluggishness.   Increased sensitivity to cold.   Constipation.   Pale, dry skin.   A puffy face.   Hoarse voice.   High blood cholesterol level.   Unexplained weight gain.   Muscle aches, tenderness and stiffness.   Pain, stiffness or swelling in your joints.   Muscle weakness.   Heavier than normal menstrual periods.   Brittle fingernails and hair.   Depression.  Thyroid Nodules - most do not cause signs or symptoms. Occasionally, some may become so large that you can feel or even see the swelling at the base of your neck. You may realize a lump or swelling is there when you are shaving or putting on makeup. Men might become  aware of a nodule when shirt collars suddenly feel too tight. Some nodules produce too much thyroid hormone. This can produce the same symptoms as hyperthyroidism (see above). Thyroid nodules are seldom cancerous. However, a nodule is more likely to be malignant (cancerous) if it:  Grows quickly or feels hard.   Causes you to become hoarse or to have trouble swallowing or breathing.   Causes enlarged lymph nodes under your jaw or in your neck.  DIAGNOSIS  Because there are so many possible thyroid conditions, your caregiver may ask for a number of tests. They will do this in order to narrow down the exact diagnosis. These tests can include:  Blood and antibody tests.   Special thyroid scans using small, safe amounts of radioactive iodine.   Ultrasound of the thyroid gland (particularly if there is a nodule or lump).   Biopsy. This is usually done with a special needle. A needle biopsy is a procedure to obtain a sample of cells from the thyroid. The tissue will be tested in a lab and examined under a microscope.  TREATMENT  Treatment depends on the exact diagnosis. Hyperthyroidism  Beta-blockers help relieve many of the symptoms.   Anti-thyroid medications prevent the thyroid from making excess hormones.   Radioactive iodine treatment can destroy overactive thyroid   cells. The iodine can permanently decrease the amount of hormone produced.   Surgery to remove the thyroid gland.   Treatments for eye problems that come from Graves' disease also include medications and special eye surgery, if felt to be appropriate.  Hypothyroidism Thyroid replacement with levothyroxine is the mainstay of treatment. Treatment with thyroid replacement is usually lifelong and will require monitoring and adjustment from time to time. Thyroid Nodules  Watchful waiting. If a small nodule causes no symptoms or signs of cancer on biopsy, then no treatment may be chosen at first. Re-exam and re-checking blood  tests would be the recommended follow-up.   Anti-thyroid medications or radioactive iodine treatment may be recommended if the nodules produce too much thyroid hormone (see Treatment for Hyperthyroidism above).   Alcohol ablation. Injections of small amounts of ethyl alcohol (ethanol) can cause a non-cancerous nodule to shrink in size.   Surgery (see Treatment for Hyperthyroidism above).  HOME CARE INSTRUCTIONS   Take medications as instructed.   Follow through on recommended testing.  SEEK MEDICAL CARE IF:   You feel that you are developing symptoms of Hyperthyroidism or Hypothyroidism as described above.   You develop a new lump/nodule in the neck/thyroid area that you had not noticed before.   You feel that you are having side effects from medicines prescribed.   You develop trouble breathing or swallowing.  SEEK IMMEDIATE MEDICAL CARE IF:   You develop a fever of 102 F (38.9 C) or higher.   You develop severe sweating.   You develop palpitations and/or rapid heart beat.   You develop shortness of breath.   You develop nausea and vomiting.   You develop extreme shakiness.   You develop agitation.   You develop lightheadedness or have a fainting episode.  Document Released: 07/19/2007 Document Revised: 09/10/2011 Document Reviewed: 07/19/2007 [employer] Patient Information 2012 [employer]. 

## 2012-07-11 ENCOUNTER — Other Ambulatory Visit: Payer: Self-pay | Admitting: Internal Medicine

## 2012-07-11 DIAGNOSIS — Z1231 Encounter for screening mammogram for malignant neoplasm of breast: Secondary | ICD-10-CM

## 2012-08-08 ENCOUNTER — Ambulatory Visit
Admission: RE | Admit: 2012-08-08 | Discharge: 2012-08-08 | Disposition: A | Discharge status: Home / Self Care | Payer: Medicare Other | Source: Ambulatory Visit | Attending: Internal Medicine | Admitting: Internal Medicine

## 2012-08-08 DIAGNOSIS — Z1231 Encounter for screening mammogram for malignant neoplasm of breast: Secondary | ICD-10-CM

## 2013-03-30 ENCOUNTER — Other Ambulatory Visit (HOSPITAL_COMMUNITY): Payer: Self-pay | Admitting: Podiatry

## 2013-03-30 DIAGNOSIS — R0989 Other specified symptoms and signs involving the circulatory and respiratory systems: Secondary | ICD-10-CM

## 2013-04-05 ENCOUNTER — Ambulatory Visit (HOSPITAL_COMMUNITY)
Admission: RE | Admit: 2013-04-05 | Discharge: 2013-04-05 | Disposition: A | Discharge status: Home / Self Care | Payer: Medicare Other | Source: Ambulatory Visit | Attending: Internal Medicine | Admitting: Internal Medicine

## 2013-04-05 DIAGNOSIS — I739 Peripheral vascular disease, unspecified: Secondary | ICD-10-CM

## 2013-04-05 DIAGNOSIS — R0989 Other specified symptoms and signs involving the circulatory and respiratory systems: Secondary | ICD-10-CM

## 2013-04-05 NOTE — Progress Notes (Signed)
Arterial Duplex Lower Ext. Completed. Suhas Estis, RDMS, RVT  

## 2013-05-04 ENCOUNTER — Encounter: Payer: Self-pay | Admitting: Cardiovascular Disease

## 2013-05-04 ENCOUNTER — Ambulatory Visit (INDEPENDENT_AMBULATORY_CARE_PROVIDER_SITE_OTHER): Payer: Medicare Other | Admitting: Cardiovascular Disease

## 2013-05-04 VITALS — BP 126/66 | HR 60 | Ht 63.0 in | Wt 215.0 lb

## 2013-05-04 DIAGNOSIS — R011 Cardiac murmur, unspecified: Secondary | ICD-10-CM

## 2013-05-04 DIAGNOSIS — I10 Essential (primary) hypertension: Secondary | ICD-10-CM

## 2013-05-04 DIAGNOSIS — E785 Hyperlipidemia, unspecified: Secondary | ICD-10-CM | POA: Insufficient documentation

## 2013-05-04 DIAGNOSIS — R0989 Other specified symptoms and signs involving the circulatory and respiratory systems: Secondary | ICD-10-CM

## 2013-05-04 NOTE — Assessment & Plan Note (Signed)
Under good control on current medications 

## 2013-05-04 NOTE — Assessment & Plan Note (Signed)
On statin therapy followed by her PCP 

## 2013-05-04 NOTE — Progress Notes (Signed)
   05/04/2013 Carolyn [name]   01/26/1936  [id number]  Primary Physician Gwen Pounds, MD Primary Cardiologist: .kb   HPI:  Ms. Whinery is a 77 year old moderately overweight married African American female mother of 2, grandmother to 3 grandchildren who is a retired Psychologist, clinical in [place]. She was referred for the courtesy of Dr. Durel Salts for peripheral vascular evaluation because of poorly palpable pedal pulses prior to anticipated surgery.protractors include tobacco abuse smoking one pack per week for 25 years, 2 hypertension and hyperlipidemia. She's never had a heart attack or stroke. She denies chest pain, shortness of breath or claudication. She had worsening Dopplers performed in our office 04/05/13 which were entirely normal.   Current Outpatient Prescriptions  Medication Sig Dispense Refill  . ezetimibe-simvastatin (VYTORIN) 10-40 MG per tablet Take 1 tablet by mouth at bedtime.        . hydrochlorothiazide (HYDRODIURIL) 25 MG tablet Take 25 mg by mouth daily.      Marland Kitchen lisinopril (PRINIVIL,ZESTRIL) 40 MG tablet Take 40 mg by mouth 2 (two) times daily.       . pantoprazole (PROTONIX) 40 MG tablet Take 40 mg by mouth daily.        . verapamil (CALAN-SR) 120 MG CR tablet Take 120 mg by mouth at bedtime.         No current facility-administered medications for this visit.    Allergies  Allergen Reactions  . Other     ALKA    History   Social History  . Marital Status: Married    Spouse Name: N/A    Number of Children: N/A  . Years of Education: N/A   Occupational History  . Not on file.   Social History Main Topics  . Smoking status: Current Some Day Smoker  . Smokeless tobacco: Not on file     Comment: PACK A WEEK  . Alcohol Use: Yes     Comment: SOMETIMES  . Drug Use: Not on file  . Sexually Active: Not on file   Other Topics Concern  . Not on file   Social History Narrative  . No narrative on file     Review of Systems: General: negative for  chills, fever, night sweats or weight changes.  Cardiovascular: negative for chest pain, dyspnea on exertion, edema, orthopnea, palpitations, paroxysmal nocturnal dyspnea or shortness of breath Dermatological: negative for rash Respiratory: negative for cough or wheezing Urologic: negative for hematuria Abdominal: negative for nausea, vomiting, diarrhea, bright red blood per rectum, melena, or hematemesis Neurologic: negative for visual changes, syncope, or dizziness All other systems reviewed and are otherwise negative except as noted above.    Blood pressure 126/66, pulse 60, height 5\' 3"  (1.6 m), weight 215 lb (97.523 kg).  General appearance: alert and no distress Neck: no adenopathy, no JVD, supple, symmetrical, trachea midline, thyroid not enlarged, symmetric, no tenderness/mass/nodules and soft bruits bilaterally Lungs: clear to auscultation bilaterally Heart: soft outflow tract murmur consistent with aortic sclerosis Abdomen: soft, non-tender; bowel sounds normal; no masses,  no organomegaly Extremities: extremities normal, atraumatic, no cyanosis or edema Pulses: 2+ and symmetric  EKG not performed today  ASSESSMENT AND PLAN:   Essential hypertension Under good control on current medications  Hyperlipidemia On statin therapy followed by her PCP      Runell Gess MD [facility], [facility] 05/04/2013 10:47 AM

## 2013-05-04 NOTE — Patient Instructions (Addendum)
  We will see you back in follow up only as needed  Dr Allyson Sabal has ordered a carotid doppler and an echocardiogram.  We will call you with those test results.  Your physician has requested that you have an echocardiogram. Echocardiography is a painless test that uses sound waves to create images of your heart. It provides your doctor with information about the size and shape of your heart and how well your heart's chambers and valves are working. This procedure takes approximately one hour. There are no restrictions for this procedure.  Your physician has requested that you have a carotid duplex. This test is an ultrasound of the carotid arteries in your neck. It looks at blood flow through these arteries that supply the brain with blood. Allow one hour for this exam. There are no restrictions or special instructions.

## 2013-05-15 ENCOUNTER — Ambulatory Visit (HOSPITAL_COMMUNITY)
Admission: RE | Admit: 2013-05-15 | Discharge: 2013-05-15 | Disposition: A | Discharge status: Home / Self Care | Payer: Medicare Other | Source: Ambulatory Visit | Attending: Cardiovascular Disease | Admitting: Cardiovascular Disease

## 2013-05-15 DIAGNOSIS — R0989 Other specified symptoms and signs involving the circulatory and respiratory systems: Secondary | ICD-10-CM | POA: Insufficient documentation

## 2013-05-15 NOTE — Progress Notes (Signed)
Carotid Duplex completed. Ndrew Creason, BS, RDMS, RVT  

## 2013-05-25 ENCOUNTER — Encounter: Payer: Self-pay | Admitting: *Deleted

## 2013-05-26 ENCOUNTER — Ambulatory Visit (HOSPITAL_COMMUNITY)
Admission: RE | Admit: 2013-05-26 | Discharge: 2013-05-26 | Disposition: A | Discharge status: Home / Self Care | Payer: Medicare Other | Source: Ambulatory Visit | Attending: Cardiovascular Disease | Admitting: Cardiovascular Disease

## 2013-05-26 DIAGNOSIS — R011 Cardiac murmur, unspecified: Secondary | ICD-10-CM

## 2013-05-26 DIAGNOSIS — I10 Essential (primary) hypertension: Secondary | ICD-10-CM | POA: Insufficient documentation

## 2013-05-26 DIAGNOSIS — E785 Hyperlipidemia, unspecified: Secondary | ICD-10-CM | POA: Insufficient documentation

## 2013-05-26 DIAGNOSIS — F17200 Nicotine dependence, unspecified, uncomplicated: Secondary | ICD-10-CM | POA: Insufficient documentation

## 2013-05-26 DIAGNOSIS — I079 Rheumatic tricuspid valve disease, unspecified: Secondary | ICD-10-CM | POA: Insufficient documentation

## 2013-05-26 NOTE — Progress Notes (Signed)
[facility]   2D echo completed 05/26/2013.   Cindy Arlynn Stare, RDCS  

## 2013-05-30 ENCOUNTER — Encounter: Payer: Self-pay | Admitting: *Deleted

## 2013-06-05 DIAGNOSIS — K831 Obstruction of bile duct: Secondary | ICD-10-CM

## 2013-06-05 HISTORY — DX: Obstruction of bile duct: K83.1

## 2013-06-13 ENCOUNTER — Encounter (HOSPITAL_COMMUNITY): Payer: Self-pay | Admitting: Emergency Medicine

## 2013-06-13 ENCOUNTER — Emergency Department (HOSPITAL_COMMUNITY)
Admission: EM | Admit: 2013-06-13 | Discharge: 2013-06-14 | Disposition: A | Discharge status: Home / Self Care | Payer: Medicare Other | Attending: Emergency Medicine | Admitting: Emergency Medicine

## 2013-06-13 DIAGNOSIS — J45909 Unspecified asthma, uncomplicated: Secondary | ICD-10-CM | POA: Insufficient documentation

## 2013-06-13 DIAGNOSIS — L539 Erythematous condition, unspecified: Secondary | ICD-10-CM | POA: Insufficient documentation

## 2013-06-13 DIAGNOSIS — I10 Essential (primary) hypertension: Secondary | ICD-10-CM | POA: Insufficient documentation

## 2013-06-13 DIAGNOSIS — K219 Gastro-esophageal reflux disease without esophagitis: Secondary | ICD-10-CM | POA: Insufficient documentation

## 2013-06-13 DIAGNOSIS — IMO0002 Reserved for concepts with insufficient information to code with codable children: Secondary | ICD-10-CM | POA: Insufficient documentation

## 2013-06-13 DIAGNOSIS — Z79899 Other long term (current) drug therapy: Secondary | ICD-10-CM | POA: Insufficient documentation

## 2013-06-13 DIAGNOSIS — F17200 Nicotine dependence, unspecified, uncomplicated: Secondary | ICD-10-CM | POA: Insufficient documentation

## 2013-06-13 DIAGNOSIS — E785 Hyperlipidemia, unspecified: Secondary | ICD-10-CM | POA: Insufficient documentation

## 2013-06-13 DIAGNOSIS — L259 Unspecified contact dermatitis, unspecified cause: Secondary | ICD-10-CM | POA: Insufficient documentation

## 2013-06-13 HISTORY — DX: Unspecified asthma, uncomplicated: J45.909

## 2013-06-13 MED ORDER — PREDNISONE 20 MG PO TABS
60.0000 mg | ORAL_TABLET | Freq: Once | ORAL | Status: AC
  Administered 2013-06-14: 60 mg via ORAL
  Filled 2013-06-13: qty 3

## 2013-06-13 MED ORDER — DIPHENHYDRAMINE HCL 25 MG PO CAPS
25.0000 mg | ORAL_CAPSULE | Freq: Once | ORAL | Status: AC
  Administered 2013-06-14: 25 mg via ORAL
  Filled 2013-06-13: qty 1

## 2013-06-13 NOTE — ED Notes (Signed)
Pt. reports itchy rashes at arms .upper chest and neck onset this morning . Respirations unlabored / airway intact.

## 2013-06-13 NOTE — ED Provider Notes (Signed)
CSN: [id number]     Arrival date & time 06/13/13  2040 History   First MD Initiated Contact with Patient 06/13/13 2345     Chief Complaint  Patient presents with  . Rash   (Consider location/radiation/quality/duration/timing/severity/associated sxs/prior Treatment) HPI Comments: Patient noticed this morning.  She had a gradual onset of a pruritic rash on her arms, abdomen, anterior chest, face and neck.  She has tried topical steroids, alcohol, and a bath, without relief.  She has not called her primary care physician.  She has not taken any by mouth over the counter medication. She denies use of new soaps, cosmetics, new clothing.  She, states she was gardening and putting in some plants yesterday  Patient is a 77 y.o. female presenting with rash. The history is provided by the patient.  Rash Location:  Head/neck and face (Chest and abdomen) Facial rash location:  Face, L cheek and R cheek Quality: burning, dryness, itchiness and redness   Severity:  Moderate Onset quality:  Gradual Duration:  8 hours Timing:  Constant Progression:  Worsening Chronicity:  New Context: food, plant contact and sun exposure   Context: not animal contact, not chemical exposure, not eggs, not exposure to similar rash, not hot tub use, not insect bite/sting, not medications, not new detergent/soap, not nuts, not pollen, not pregnancy and not sick contacts   Relieved by:  None tried Worsened by:  Nothing tried Ineffective treatments:  Topical steroids Associated symptoms: no abdominal pain, no fever, no headaches, no hoarse voice, no induration, no joint pain, no myalgias, no nausea, no periorbital edema, no shortness of breath, no sore throat, no throat swelling, no tongue swelling, not vomiting and not wheezing     Past Medical History  Diagnosis Date  . Hypertension   . Hyperlipidemia   . GERD (gastroesophageal reflux disease)   . Asthma    Past Surgical History  Procedure Laterality Date  . Neck  surgery  2005/2007  . Hand surgery  2010  . Cataract extraction    . Abdominal hysterectomy     No family history on file. History  Substance Use Topics  . Smoking status: Current Some Day Smoker  . Smokeless tobacco: Not on file     Comment: PACK A WEEK  . Alcohol Use: Yes     Comment: SOMETIMES   OB History   Grav Para Term Preterm Abortions TAB SAB Ect Mult Living                 Review of Systems  Constitutional: Negative for fever and chills.  HENT: Negative for congestion, sore throat, hoarse voice and rhinorrhea.   Eyes: Negative for pain and visual disturbance.  Respiratory: Negative for shortness of breath and wheezing.   Gastrointestinal: Negative for nausea, vomiting and abdominal pain.  Musculoskeletal: Negative for myalgias and arthralgias.  Skin: Positive for rash.  Neurological: Negative for dizziness, weakness, numbness and headaches.  All other systems reviewed and are negative.    Allergies  Other  Home Medications   Current Outpatient Rx  Name  Route  Sig  Dispense  Refill  . cholecalciferol (VITAMIN D) 1000 UNITS tablet   Oral   Take 1,000 Units by mouth daily.         . Esomeprazole Magnesium (NEXIUM PO)   Oral   Take 1 capsule by mouth daily.         Marland Kitchen ezetimibe-simvastatin (VYTORIN) 10-40 MG per tablet   Oral   Take 1 tablet  by mouth daily.          . hydrochlorothiazide (HYDRODIURIL) 25 MG tablet   Oral   Take 25 mg by mouth daily.         Marland Kitchen lisinopril (PRINIVIL,ZESTRIL) 40 MG tablet   Oral   Take 40 mg by mouth 2 (two) times daily.          . naproxen sodium (ANAPROX) 220 MG tablet   Oral   Take 440 mg by mouth daily as needed (pain).         Marland Kitchen OVER THE COUNTER MEDICATION   Both Eyes   Place 1 drop into both eyes at bedtime as needed (dry eyes). Over the counter eye drops for dry eyes         . verapamil (CALAN-SR) 120 MG CR tablet   Oral   Take 120 mg by mouth daily.          . predniSONE (DELTASONE) 20  MG tablet   Oral   Take 2 tablets (40 mg total) by mouth daily.   10 tablet   0    BP 143/103  Pulse 77  Temp(Src) 98.5 F (36.9 C) (Oral)  Resp 14  SpO2 98% Physical Exam  Nursing note and vitals reviewed. Constitutional: She is oriented to person, place, and time. She appears well-developed and well-nourished. She appears distressed.  HENT:  Head: Normocephalic.  Mouth/Throat: Oropharynx is clear and moist.  Eyes: Pupils are equal, round, and reactive to light.  Neck: Normal range of motion.  Cardiovascular: Normal rate and regular rhythm.   Pulmonary/Chest: Effort normal and breath sounds normal. No respiratory distress. She has no rales.  Abdominal: Soft. She exhibits no distension. There is no tenderness.  Musculoskeletal: Normal range of motion. She exhibits no edema and no tenderness.  Lymphadenopathy:    She has no cervical adenopathy.  Neurological: She is alert and oriented to person, place, and time.  Skin: Skin is warm. There is erythema.  No discrete rash noted.  The skin is red, and irritated and patient keeps scratching at it under the eye is slightly swollen, with some erythema, lips appear chapped.  There is no rash within the oral cavity    ED Course  Procedures (including critical care time) Labs Review Labs Reviewed - No data to display Imaging Review No results found.  MDM   1. Contact dermatitis     I've asked the nurse to give the patient.  Steroids, and Benadryl.  Will observe Patient exam is very emergency department, steady in her feet and awake.  She feels comfortable driving herself home   Arman Filter, NP 06/14/13 0514  Arman Filter, NP 06/14/13 1610  Arman Filter, NP 06/14/13 0515  Arman Filter, NP 06/14/13 [phone]

## 2013-06-14 MED ORDER — PREDNISONE 20 MG PO TABS: 40.0000 mg | ORAL_TABLET | Freq: Every day | ORAL | Status: AC

## 2013-06-14 NOTE — ED Notes (Signed)
Pt. Denies drowsiness at this time. States "I feel fine to drive". Pt. Alert and oriented x4. Ambulates without problem.

## 2013-06-14 NOTE — ED Notes (Signed)
Pt. Ambulated to bathroom with no problems. States "I do not feel drowsy. I'm just still itchy".

## 2013-06-14 NOTE — ED Notes (Signed)
Pt transported to room C25 via wheelchair. Awake at this time. Assisted to bed.

## 2013-06-14 NOTE — ED Provider Notes (Signed)
Medical screening examination/treatment/procedure(s) were performed by non-physician practitioner and as supervising physician I was immediately available for consultation/collaboration.   Olivia Mackie, MD 06/14/13 [phone]

## 2013-07-02 ENCOUNTER — Inpatient Hospital Stay (HOSPITAL_COMMUNITY)
Admission: EM | Admit: 2013-07-02 | Discharge: 2013-07-08 | DRG: 405 | Disposition: A | Discharge status: Home Health | Payer: Medicare Other | Attending: Internal Medicine | Admitting: Internal Medicine

## 2013-07-02 ENCOUNTER — Emergency Department (HOSPITAL_COMMUNITY): Payer: Medicare Other

## 2013-07-02 ENCOUNTER — Encounter (HOSPITAL_COMMUNITY): Payer: Self-pay | Admitting: *Deleted

## 2013-07-02 DIAGNOSIS — E785 Hyperlipidemia, unspecified: Secondary | ICD-10-CM | POA: Diagnosis present

## 2013-07-02 DIAGNOSIS — R109 Unspecified abdominal pain: Secondary | ICD-10-CM | POA: Diagnosis present

## 2013-07-02 DIAGNOSIS — R17 Unspecified jaundice: Secondary | ICD-10-CM | POA: Diagnosis present

## 2013-07-02 DIAGNOSIS — K219 Gastro-esophageal reflux disease without esophagitis: Secondary | ICD-10-CM | POA: Diagnosis present

## 2013-07-02 DIAGNOSIS — D649 Anemia, unspecified: Secondary | ICD-10-CM | POA: Diagnosis present

## 2013-07-02 DIAGNOSIS — L299 Pruritus, unspecified: Secondary | ICD-10-CM | POA: Diagnosis present

## 2013-07-02 DIAGNOSIS — C250 Malignant neoplasm of head of pancreas: Secondary | ICD-10-CM | POA: Diagnosis present

## 2013-07-02 DIAGNOSIS — C801 Malignant (primary) neoplasm, unspecified: Secondary | ICD-10-CM

## 2013-07-02 DIAGNOSIS — I2510 Atherosclerotic heart disease of native coronary artery without angina pectoris: Secondary | ICD-10-CM | POA: Diagnosis present

## 2013-07-02 DIAGNOSIS — K838 Other specified diseases of biliary tract: Secondary | ICD-10-CM | POA: Diagnosis present

## 2013-07-02 DIAGNOSIS — L259 Unspecified contact dermatitis, unspecified cause: Secondary | ICD-10-CM | POA: Diagnosis present

## 2013-07-02 DIAGNOSIS — E43 Unspecified severe protein-calorie malnutrition: Secondary | ICD-10-CM | POA: Insufficient documentation

## 2013-07-02 DIAGNOSIS — K8689 Other specified diseases of pancreas: Secondary | ICD-10-CM | POA: Diagnosis present

## 2013-07-02 DIAGNOSIS — I10 Essential (primary) hypertension: Secondary | ICD-10-CM

## 2013-07-02 DIAGNOSIS — E042 Nontoxic multinodular goiter: Secondary | ICD-10-CM | POA: Diagnosis present

## 2013-07-02 DIAGNOSIS — Z87891 Personal history of nicotine dependence: Secondary | ICD-10-CM

## 2013-07-02 DIAGNOSIS — K831 Obstruction of bile duct: Secondary | ICD-10-CM | POA: Diagnosis present

## 2013-07-02 DIAGNOSIS — K5730 Diverticulosis of large intestine without perforation or abscess without bleeding: Secondary | ICD-10-CM | POA: Diagnosis present

## 2013-07-02 DIAGNOSIS — K861 Other chronic pancreatitis: Secondary | ICD-10-CM | POA: Diagnosis present

## 2013-07-02 DIAGNOSIS — K859 Acute pancreatitis without necrosis or infection, unspecified: Principal | ICD-10-CM | POA: Diagnosis present

## 2013-07-02 DIAGNOSIS — K5900 Constipation, unspecified: Secondary | ICD-10-CM | POA: Diagnosis present

## 2013-07-02 HISTORY — DX: Acute pancreatitis without necrosis or infection, unspecified: K85.90

## 2013-07-02 HISTORY — DX: Obstruction of bile duct: K83.1

## 2013-07-02 LAB — CBC WITH DIFFERENTIAL/PLATELET
Basophils Relative: 0 % (ref 0–1)
Eosinophils Absolute: 0.2 10*3/uL (ref 0.0–0.7)
HCT: 36.8 % (ref 36.0–46.0)
Hemoglobin: 13.4 g/dL (ref 12.0–15.0)
MCH: 28.6 pg (ref 26.0–34.0)
MCHC: 36.4 g/dL — ABNORMAL HIGH (ref 30.0–36.0)
MCV: 78.5 fL (ref 78.0–100.0)
Monocytes Absolute: 1.1 10*3/uL — ABNORMAL HIGH (ref 0.1–1.0)
Monocytes Relative: 9 % (ref 3–12)

## 2013-07-02 LAB — URINE MICROSCOPIC-ADD ON

## 2013-07-02 LAB — PROTIME-INR: Prothrombin Time: 12.4 seconds (ref 11.6–15.2)

## 2013-07-02 LAB — COMPREHENSIVE METABOLIC PANEL
Albumin: 2.9 g/dL — ABNORMAL LOW (ref 3.5–5.2)
BUN: 41 mg/dL — ABNORMAL HIGH (ref 6–23)
Creatinine, Ser: 1.28 mg/dL — ABNORMAL HIGH (ref 0.50–1.10)
GFR calc Af Amer: 46 mL/min — ABNORMAL LOW (ref >90)
Glucose, Bld: 144 mg/dL — ABNORMAL HIGH (ref 70–99)
Total Bilirubin: 13.9 mg/dL — ABNORMAL HIGH (ref 0.3–1.2)
Total Protein: 6.9 g/dL (ref 6.0–8.3)

## 2013-07-02 LAB — URINALYSIS, ROUTINE W REFLEX MICROSCOPIC
Ketones, ur: 15 mg/dL — AB
Nitrite: POSITIVE — AB
Urobilinogen, UA: 1 mg/dL (ref 0.0–1.0)
pH: 5 (ref 5.0–8.0)

## 2013-07-02 LAB — LIPASE, BLOOD: Lipase: 754 U/L — ABNORMAL HIGH (ref 11–59)

## 2013-07-02 MED ORDER — SODIUM CHLORIDE 0.9 % IV SOLN
INTRAVENOUS | Status: DC
  Administered 2013-07-02 – 2013-07-07 (×6): via INTRAVENOUS

## 2013-07-02 MED ORDER — DOCUSATE SODIUM 100 MG PO CAPS
100.0000 mg | ORAL_CAPSULE | Freq: Two times a day (BID) | ORAL | Status: DC
  Administered 2013-07-02 – 2013-07-08 (×9): 100 mg via ORAL
  Filled 2013-07-02 (×10): qty 1

## 2013-07-02 MED ORDER — ONDANSETRON HCL 4 MG/2ML IJ SOLN
4.0000 mg | Freq: Four times a day (QID) | INTRAMUSCULAR | Status: DC | PRN
  Administered 2013-07-02: 4 mg via INTRAVENOUS
  Filled 2013-07-02: qty 2

## 2013-07-02 MED ORDER — IOHEXOL 300 MG/ML  SOLN
25.0000 mL | INTRAMUSCULAR | Status: AC
  Administered 2013-07-02: 25 mL via ORAL

## 2013-07-02 MED ORDER — LISINOPRIL 40 MG PO TABS
40.0000 mg | ORAL_TABLET | Freq: Two times a day (BID) | ORAL | Status: DC
  Administered 2013-07-03 – 2013-07-08 (×8): 40 mg via ORAL
  Filled 2013-07-02 (×11): qty 1

## 2013-07-02 MED ORDER — SODIUM CHLORIDE 0.9 % IV BOLUS (SEPSIS)
500.0000 mL | Freq: Once | INTRAVENOUS | Status: AC
  Administered 2013-07-02: 500 mL via INTRAVENOUS

## 2013-07-02 MED ORDER — PANTOPRAZOLE SODIUM 40 MG PO TBEC
40.0000 mg | DELAYED_RELEASE_TABLET | Freq: Every day | ORAL | Status: DC
  Administered 2013-07-03 – 2013-07-08 (×3): 40 mg via ORAL
  Filled 2013-07-02 (×3): qty 1

## 2013-07-02 MED ORDER — PANTOPRAZOLE SODIUM 40 MG PO TBEC: 40.0000 mg | DELAYED_RELEASE_TABLET | Freq: Every day | ORAL | Status: DC

## 2013-07-02 MED ORDER — VERAPAMIL HCL ER 120 MG PO TBCR
120.0000 mg | EXTENDED_RELEASE_TABLET | Freq: Every day | ORAL | Status: DC
Start: 2013-07-02 — End: 2013-07-02

## 2013-07-02 MED ORDER — ONDANSETRON HCL 4 MG PO TABS
4.0000 mg | ORAL_TABLET | Freq: Four times a day (QID) | ORAL | Status: DC | PRN
  Administered 2013-07-07: 4 mg via ORAL
  Filled 2013-07-02: qty 1

## 2013-07-02 MED ORDER — DIPHENHYDRAMINE HCL 25 MG PO CAPS
25.0000 mg | ORAL_CAPSULE | Freq: Every day | ORAL | Status: DC | PRN
  Administered 2013-07-02 – 2013-07-08 (×6): 25 mg via ORAL
  Filled 2013-07-02 (×6): qty 1

## 2013-07-02 MED ORDER — VERAPAMIL HCL ER 120 MG PO TBCR
120.0000 mg | EXTENDED_RELEASE_TABLET | Freq: Every day | ORAL | Status: DC
  Administered 2013-07-02 – 2013-07-08 (×6): 120 mg via ORAL
  Filled 2013-07-02 (×7): qty 1

## 2013-07-02 MED ORDER — POLYETHYLENE GLYCOL 3350 17 G PO PACK
17.0000 g | PACK | Freq: Every day | ORAL | Status: DC | PRN
  Administered 2013-07-07: 17 g via ORAL
  Filled 2013-07-02: qty 1

## 2013-07-02 MED ORDER — HYDROXYZINE HCL 25 MG PO TABS
25.0000 mg | ORAL_TABLET | Freq: Three times a day (TID) | ORAL | Status: DC | PRN
  Administered 2013-07-03: 25 mg via ORAL
  Filled 2013-07-02: qty 1

## 2013-07-02 MED ORDER — FENTANYL CITRATE 0.05 MG/ML IJ SOLN
50.0000 ug | Freq: Once | INTRAMUSCULAR | Status: AC
  Administered 2013-07-02: 50 ug via INTRAVENOUS
  Filled 2013-07-02: qty 2

## 2013-07-02 MED ORDER — ZOLPIDEM TARTRATE 5 MG PO TABS
5.0000 mg | ORAL_TABLET | Freq: Every evening | ORAL | Status: DC | PRN
  Administered 2013-07-02 – 2013-07-05 (×3): 5 mg via ORAL
  Filled 2013-07-02 (×4): qty 1

## 2013-07-02 MED ORDER — ALUM & MAG HYDROXIDE-SIMETH 200-200-20 MG/5ML PO SUSP: 30.0000 mL | Freq: Four times a day (QID) | ORAL | Status: DC | PRN

## 2013-07-02 MED ORDER — MORPHINE SULFATE 2 MG/ML IJ SOLN
2.0000 mg | INTRAMUSCULAR | Status: DC | PRN
  Administered 2013-07-02 – 2013-07-07 (×9): 2 mg via INTRAVENOUS
  Filled 2013-07-02 (×9): qty 1

## 2013-07-02 MED ORDER — VITAMIN D3 25 MCG (1000 UNIT) PO TABS
1000.0000 [IU] | ORAL_TABLET | Freq: Every day | ORAL | Status: DC
  Administered 2013-07-03 – 2013-07-08 (×4): 1000 [IU] via ORAL
  Filled 2013-07-02 (×6): qty 1

## 2013-07-02 MED ORDER — ONDANSETRON HCL 4 MG/2ML IJ SOLN
4.0000 mg | Freq: Once | INTRAMUSCULAR | Status: AC
  Administered 2013-07-02: 4 mg via INTRAVENOUS
  Filled 2013-07-02: qty 2

## 2013-07-02 MED ORDER — IOHEXOL 300 MG/ML  SOLN
80.0000 mL | Freq: Once | INTRAMUSCULAR | Status: AC | PRN
  Administered 2013-07-02: 80 mL via INTRAVENOUS

## 2013-07-02 NOTE — ED Notes (Signed)
Pt has multiple complaints. Reports being seen here on 9/9 for rash, reports that the rash is coming back. Now having pain to entire body, abd pain, bloating, vomiting, constipation and dark urine.

## 2013-07-02 NOTE — ED Notes (Signed)
Patient returned from X-ray 

## 2013-07-02 NOTE — ED Provider Notes (Signed)
CSN: [id number]     Arrival date & time 07/02/13  1052 History   First MD Initiated Contact with Patient 07/02/13 1109     Chief Complaint  Patient presents with  . Pain  . Emesis   (Consider location/radiation/quality/duration/timing/severity/associated sxs/prior Treatment) Patient is a 77 y.o. female presenting with vomiting. The history is provided by the patient.  Emesis Associated symptoms: abdominal pain and diarrhea   Associated symptoms: no headaches    patient is complaining of pain everywhere. She states it is worse in her abdomen. It is dull and constant. It is worse with eating. She states she's been eating less because it hurts if she eats. She states it also hurts her she does not eat. She's had some vomiting. No fevers. No cough. She states she's had some dysuria. She states she's had occasional diarrhea. She states she hurts from her head to her feet. She states her urine has changed color. Patient states she feels as if the rash he had before starting to come back. She states it is going to be on her  Past Medical History  Diagnosis Date  . Hypertension   . Hyperlipidemia   . GERD (gastroesophageal reflux disease)   . Asthma    Past Surgical History  Procedure Laterality Date  . Neck surgery  2005/2007  . Hand surgery  2010  . Cataract extraction    . Abdominal hysterectomy     History reviewed. No pertinent family history. History  Substance Use Topics  . Smoking status: Current Some Day Smoker  . Smokeless tobacco: Not on file     Comment: PACK A WEEK  . Alcohol Use: Yes     Comment: SOMETIMES   OB History   Grav Para Term Preterm Abortions TAB SAB Ect Mult Living                 Review of Systems  Constitutional: Negative for activity change and appetite change.  HENT: Negative for neck stiffness.   Eyes: Negative for pain.  Respiratory: Negative for chest tightness and shortness of breath.   Cardiovascular: Negative for chest pain and leg swelling.   Gastrointestinal: Positive for nausea, vomiting, abdominal pain and diarrhea.  Genitourinary: Positive for dysuria. Negative for flank pain.  Musculoskeletal: Negative for back pain.  Skin: Positive for rash.  Neurological: Negative for weakness, numbness and headaches.  Psychiatric/Behavioral: Negative for behavioral problems.    Allergies  Other  Home Medications   No current outpatient prescriptions on file. BP 169/45  Pulse 62  Temp(Src) 98.1 F (36.7 C) (Oral)  Resp 18  Ht 5\' 3"  (1.6 m)  Wt 187 lb (84.823 kg)  BMI 33.13 kg/m2  SpO2 94% Physical Exam  Nursing note and vitals reviewed. Constitutional: She is oriented to person, place, and time. She appears well-developed and well-nourished.  HENT:  Head: Normocephalic and atraumatic.  Eyes: EOM are normal. Pupils are equal, round, and reactive to light. Scleral icterus is present.  Neck: Normal range of motion. Neck supple. Thyromegaly present.  Cardiovascular: Normal rate, regular rhythm and normal heart sounds.   No murmur heard. Pulmonary/Chest: Effort normal and breath sounds normal. No respiratory distress. She has no wheezes. She has no rales.  Abdominal: Soft. Bowel sounds are normal. She exhibits no distension. There is tenderness. There is no rebound and no guarding.  Mild upper abdominal tenderness without rebound or guarding.  Musculoskeletal: Normal range of motion.  Neurological: She is alert and oriented to person, place, and  time. No cranial nerve deficit.  Skin: Skin is warm and dry.  Psychiatric: She has a normal mood and affect. Her speech is normal.    ED Course  Procedures (including critical care time) Labs Review Labs Reviewed  CBC WITH DIFFERENTIAL - Abnormal; Notable for the following:    WBC 12.3 (*)    MCHC 36.4 (*)    RDW 15.8 (*)    Neutro Abs 8.8 (*)    Monocytes Absolute 1.1 (*)    All other components within normal limits  COMPREHENSIVE METABOLIC PANEL - Abnormal; Notable for the  following:    Sodium 133 (*)    Glucose, Bld 144 (*)    BUN 41 (*)    Creatinine, Ser 1.28 (*)    Albumin 2.9 (*)    AST 345 (*)    ALT 533 (*)    Alkaline Phosphatase 879 (*)    Total Bilirubin 13.9 (*)    GFR calc non Af Amer 40 (*)    GFR calc Af Amer 46 (*)    All other components within normal limits  LIPASE, BLOOD - Abnormal; Notable for the following:    Lipase 754 (*)    All other components within normal limits  URINALYSIS, ROUTINE W REFLEX MICROSCOPIC - Abnormal; Notable for the following:    Color, Urine ORANGE (*)    APPearance CLOUDY (*)    Bilirubin Urine LARGE (*)    Ketones, ur 15 (*)    Protein, ur 30 (*)    Nitrite POSITIVE (*)    Leukocytes, UA SMALL (*)    All other components within normal limits  URINE MICROSCOPIC-ADD ON - Abnormal; Notable for the following:    Squamous Epithelial / LPF FEW (*)    Casts HYALINE CASTS (*)    All other components within normal limits  PROTIME-INR  CANCER ANTIGEN 19-9  COMPREHENSIVE METABOLIC PANEL  CBC   Imaging Review Dg Chest 2 View  07/02/2013   CLINICAL DATA:  Fifteen. Hypertension.  Abdominal pain.  EXAM: CHEST  2 VIEW  COMPARISON:  03/06/2010  FINDINGS: The heart size and mediastinal contours are within normal limits. Both lungs are clear. Atherosclerotic calcification and mild tortuosity of thoracic aorta are stable. The visualized skeletal structures are unremarkable.  IMPRESSION: No active cardiopulmonary disease.   Electronically Signed   By: Myles Rosenthal   On: 07/02/2013 12:13   Ct Abdomen Pelvis W Contrast  07/02/2013   CLINICAL DATA:  Jaundice. Abdominal pain and vomiting.  Dark urine.  EXAM: CT ABDOMEN AND PELVIS WITH CONTRAST  TECHNIQUE: Multidetector CT imaging of the abdomen and pelvis was performed using the standard protocol following bolus administration of intravenous contrast.  CONTRAST:  80mL OMNIPAQUE IOHEXOL 300 MG/ML  SOLN  COMPARISON:  06/13/2010  FINDINGS: Prior cholecystectomy again noted as well  as multiple small hepatic cysts, however there is diffuse biliary and pancreatic ductal dilatation which is new since previous study. A heterogeneous mass is seen involving the pancreatic head and uncinate process which measures 4.1 x 3.8 x 3.2 and is highly suspicious for pancreatic carcinoma.  No evidence of peripancreatic lymphadenopathy or inflammatory changes. No soft tissue masses or lymphadenopathy seen elsewhere within the abdomen or pelvis.  Prior hysterectomy noted. Adnexal regions are unremarkable. Diverticulosis is seen mainly involving the sigmoid colon, however there is no evidence of diverticulitis. Normal appendix is visualized. No evidence of dilated bowel loops. No suspicious bone lesions identified.  IMPRESSION: Diffuse biliary and pancreatic ductal dilatation, with  4 cm mass in the pancreatic head and uncinate process, highly suspicious for pancreatic carcinoma. Consider ERCP or MRCP for further evaluation.  No evidence of metastatic disease. Stable hepatic cysts.  Diverticulosis. No radiographic evidence of diverticulitis.   Electronically Signed   By: Myles Rosenthal   On: 07/02/2013 14:32    MDM   1. Pancreatic mass   2. Biliary obstruction due to malignant neoplasm    Patient presents with pain in her abdomen. Found to be jaundiced. Fundi 4 cm pancreatic mass, likely malignant. Has elevated bilirubin. Also has elevated lipase. Will be admitted to patient's PCP.    Juliet Rude. Rubin Payor, MD 07/02/13 2014

## 2013-07-02 NOTE — ED Notes (Signed)
Pickering MD at bedside. 

## 2013-07-02 NOTE — ED Notes (Signed)
Assisted patient to bathroom for urine specimen.

## 2013-07-02 NOTE — ED Notes (Signed)
Notified CT patient is finished with contrast

## 2013-07-02 NOTE — ED Notes (Signed)
Patient transported to CT 

## 2013-07-02 NOTE — ED Notes (Signed)
Internal Medicine, MD at bedside. 

## 2013-07-02 NOTE — ED Notes (Signed)
Patient returned from CT

## 2013-07-02 NOTE — ED Notes (Signed)
Patient transported to X-ray 

## 2013-07-02 NOTE — H&P (Signed)
PCP:   Gwen Pounds, MD   Chief Complaint:  Abdominal pain  HPI: Carolyn [name] is a 77 year old African American female with a history of hypertension, recurrent pancreatitis (4/08 and 9/11), and GERD who presented to the emergency department with the complaint of abdominal pain. Patient states over the past week she's had a significant increase in crampy abdominal pain. The pain has been severe present during the day and at night. It is worse after eating. She has had some constipation. In addition, she noted that her urine was becoming darker, orange in color yesterday. The pain is similar to pain she had 2 years ago when she was evaluated by Dr. Madilyn Fireman. At that time she had an EGD and endoscopy that was unrevealing. The pain was attributed to reflux and has been absent for the past 2 years. In addition, she has had diffuse itching that began about 3 weeks ago. She is anxious seen in the ER on 9/9 for a rash and was diagnosed with contact dermatitis. The itching was initially around her neck and lips but has now spread all over her entire body.    In the emergency department she is found to have a bilirubin of 13.9, lipase 754, AST 345, ALT 533. CT of abdomen and pelvis revealed a 4 cm mass in the head of the pancreas and uncinate process concerning for pancreatic cancer.  She will be admitted for management of abdominal pain/pancreatitis and obstructive jaundice.  Review of Systems:  Review of Systems - She has had about a 7 pound weight loss over the past week. No prior weight loss noted chronic lower extremity swelling.  Past Medical History: HTN,   GERD,   Hyperlipidemia,   Pancreatitis--mild 4/08/06/11/10,   Non Toxic Multinodular goiter/Thyromegaly - Thyroid bx--Follicular Lesion Adenomatous Polyp.,    (L) ICA stenosis,  Pilonidal cyst,  OA,  H/O Superficial Thrombophlebitis,  L Rotator Cuff Issues,  Osteopenia  Past Medical History Thyroid--Follicular Lesion Adenomatous  Polyp. Hysterectomy Cholecystectomy Polypectomy Cervical C4-5 decompression Laminectomy 08/02/03 (B) C3-7 decompression & fusion 05/13/06 (B) CTS surgery Pilonidal cyst removal L cataract Surgery    Medications: Prior to Admission medications   Medication Sig Start Date End Date Taking? Authorizing Provider  cholecalciferol (VITAMIN D) 1000 UNITS tablet Take 1,000 Units by mouth daily.   Yes Historical Provider, MD  diphenhydrAMINE (BENADRYL) 25 mg capsule Take 25 mg by mouth daily as needed for itching.   Yes Historical Provider, MD  esomeprazole (NEXIUM) 40 MG capsule Take 40 mg by mouth daily before breakfast.   Yes Historical Provider, MD  ezetimibe-simvastatin (VYTORIN) 10-40 MG per tablet Take 1 tablet by mouth daily.    Yes Historical Provider, MD  hydrochlorothiazide (HYDRODIURIL) 25 MG tablet Take 25 mg by mouth daily.   Yes Historical Provider, MD  lisinopril (PRINIVIL,ZESTRIL) 40 MG tablet Take 40 mg by mouth 2 (two) times daily.    Yes Historical Provider, MD  naproxen sodium (ANAPROX) 220 MG tablet Take 440 mg by mouth daily as needed (pain).   Yes Historical Provider, MD  verapamil (CALAN-SR) 120 MG CR tablet Take 120 mg by mouth daily.    Yes Historical Provider, MD    Allergies:   Allergies  Allergen Reactions  . Other Other (See Comments)    Alka seltzer caused severe swelling    Social History:  married with 2 children, 3 grandchildren Retired but volunteers Tobacco: quit 10/04 No Alcohol.   Family History: Father died CHF, CAD, HTN, OA Mother died  HTN, burned to death  Physical Exam: Filed Vitals:   07/02/13 1123 07/02/13 1227 07/02/13 1247 07/02/13 1344  BP: 142/89 140/68 144/57 172/62  Pulse: 72 52 52 58  Temp:      TempSrc:      Resp: 16 16 16 16   SpO2: 99% 97% 97% 100%   General appearance: alert and icteric Head: Normocephalic, without obvious abnormality, atraumatic Eyes: Sclera icterus Nose: Nares normal. Septum midline. Mucosa normal.  No drainage or sinus tenderness. Throat: lips, mucosa, and tongue normal; teeth and gums normal: Icterus under the tongue Neck: no adenopathy, no carotid bruit, no JVD and thyroid not enlarged, symmetric, no tenderness/mass/nodules Resp: clear to auscultation bilaterally Cardio: regular rate and rhythm and Grade 3/6 systolic ejection murmur heard throughout the precordium GI: soft, non-tender; bowel sounds normal; no masses,  no organomegaly Extremities: extremities normal, atraumatic, no cyanosis or edema Pulses: 2+ and symmetric Lymph nodes: Cervical adenopathy: no cervical lymphadenopathy Neurologic: Alert and oriented X 3, normal strength and tone. Normal symmetric reflexes.  Skin: Diffuse itching noted   Labs on Admission:   Recent Labs  07/02/13 1145  NA 133*  K 4.0  CL 96  CO2 23  GLUCOSE 144*  BUN 41*  CREATININE 1.28*  CALCIUM 9.2    Recent Labs  07/02/13 1145  AST 345*  ALT 533*  ALKPHOS 879*  BILITOT 13.9*  PROT 6.9  ALBUMIN 2.9*    Recent Labs  07/02/13 1145  LIPASE 754*    Recent Labs  07/02/13 1145  WBC 12.3*  NEUTROABS 8.8*  HGB 13.4  HCT 36.8  MCV 78.5  PLT 294    Radiological Exams on Admission: Dg Chest 2 View  07/02/2013   CLINICAL DATA:  Fifteen. Hypertension.  Abdominal pain.  EXAM: CHEST  2 VIEW  COMPARISON:  03/06/2010  FINDINGS: The heart size and mediastinal contours are within normal limits. Both lungs are clear. Atherosclerotic calcification and mild tortuosity of thoracic aorta are stable. The visualized skeletal structures are unremarkable.  IMPRESSION: No active cardiopulmonary disease.   Electronically Signed   By: Myles Rosenthal   On: 07/02/2013 12:13   Ct Abdomen Pelvis W Contrast  07/02/2013   CLINICAL DATA:  Jaundice. Abdominal pain and vomiting.  Dark urine.  EXAM: CT ABDOMEN AND PELVIS WITH CONTRAST  TECHNIQUE: Multidetector CT imaging of the abdomen and pelvis was performed using the standard protocol following bolus  administration of intravenous contrast.  CONTRAST:  80mL OMNIPAQUE IOHEXOL 300 MG/ML  SOLN  COMPARISON:  06/13/2010  FINDINGS: Prior cholecystectomy again noted as well as multiple small hepatic cysts, however there is diffuse biliary and pancreatic ductal dilatation which is new since previous study. A heterogeneous mass is seen involving the pancreatic head and uncinate process which measures 4.1 x 3.8 x 3.2 and is highly suspicious for pancreatic carcinoma.  No evidence of peripancreatic lymphadenopathy or inflammatory changes. No soft tissue masses or lymphadenopathy seen elsewhere within the abdomen or pelvis.  Prior hysterectomy noted. Adnexal regions are unremarkable. Diverticulosis is seen mainly involving the sigmoid colon, however there is no evidence of diverticulitis. Normal appendix is visualized. No evidence of dilated bowel loops. No suspicious bone lesions identified.  IMPRESSION: Diffuse biliary and pancreatic ductal dilatation, with 4 cm mass in the pancreatic head and uncinate process, highly suspicious for pancreatic carcinoma. Consider ERCP or MRCP for further evaluation.  No evidence of metastatic disease. Stable hepatic cysts.  Diverticulosis. No radiographic evidence of diverticulitis.   Electronically Signed  By: Myles Rosenthal   On: 07/02/2013 14:32   No orders found for this or any previous visit.  Assessment/Plan Principal Problem: 1. Obstructive jaundice  secondary to Pancreatic mass- I discussed with Dr. Ewing Schlein [facility]) who will see the patient. She will need an ERCP with stent placement to help relieve her pain and obstructive jaundice as well as for diagnosis. May also need an EUS to evaluate for local metastases. No evidence of metastatic disease seen in the abdomen/pelvis. Obtain CA 19-9.  Active Problems: 2. Acute pancreatitis/ Abdominal pain- we'll treat symptomatically with n.p.o. status, IV fluid hydration, and pain medications.   3. Pruritus- secondary to jaundice.  Treat with Atarax as needed. 4. Essential hypertension- we'll hold HCTZ and continue other home blood pressure medications. 5. Hyperlipidemia- hold statin do to elevated liver function tests. 6. Disposition- anticipate discharge to home following diagnostic procedure with outpatient followup for treatment plan formulation based on biopsy results.  Javeria Briski,W DOUGLAS 07/02/2013, 3:07 PM

## 2013-07-03 DIAGNOSIS — E43 Unspecified severe protein-calorie malnutrition: Secondary | ICD-10-CM | POA: Insufficient documentation

## 2013-07-03 LAB — COMPREHENSIVE METABOLIC PANEL
ALT: 384 U/L — ABNORMAL HIGH (ref 0–35)
Alkaline Phosphatase: 697 U/L — ABNORMAL HIGH (ref 39–117)
BUN: 34 mg/dL — ABNORMAL HIGH (ref 6–23)
CO2: 21 mEq/L (ref 19–32)
Calcium: 8.8 mg/dL (ref 8.4–10.5)
Chloride: 100 mEq/L (ref 96–112)
GFR calc Af Amer: 52 mL/min — ABNORMAL LOW (ref >90)
GFR calc non Af Amer: 45 mL/min — ABNORMAL LOW (ref >90)
Glucose, Bld: 80 mg/dL (ref 70–99)
Sodium: 136 mEq/L (ref 135–145)
Total Protein: 5.6 g/dL — ABNORMAL LOW (ref 6.0–8.3)

## 2013-07-03 LAB — CBC
HCT: 30.8 % — ABNORMAL LOW (ref 36.0–46.0)
MCV: 77.6 fL — ABNORMAL LOW (ref 78.0–100.0)
RBC: 3.97 MIL/uL (ref 3.87–5.11)
RDW: 15.6 % — ABNORMAL HIGH (ref 11.5–15.5)
WBC: 10.5 10*3/uL (ref 4.0–10.5)

## 2013-07-03 LAB — TSH: TSH: 0.16 u[IU]/mL — ABNORMAL LOW (ref 0.350–4.500)

## 2013-07-03 MED ORDER — ADULT MULTIVITAMIN W/MINERALS CH
1.0000 | ORAL_TABLET | Freq: Every day | ORAL | Status: DC
  Administered 2013-07-03 – 2013-07-08 (×4): 1 via ORAL
  Filled 2013-07-03 (×6): qty 1

## 2013-07-03 MED ORDER — HYDROCERIN EX CREA
TOPICAL_CREAM | Freq: Four times a day (QID) | CUTANEOUS | Status: DC | PRN
Start: 2013-07-03 — End: 2013-07-03

## 2013-07-03 MED ORDER — ENSURE COMPLETE PO LIQD: 237.0000 mL | Freq: Two times a day (BID) | ORAL | Status: DC | PRN

## 2013-07-03 MED ORDER — DEXTROSE 5 % IV SOLN
1.0000 g | Freq: Once | INTRAVENOUS | Status: AC
  Administered 2013-07-04: 1 g via INTRAVENOUS
  Filled 2013-07-03: qty 10

## 2013-07-03 MED ORDER — NONFORMULARY OR COMPOUNDED ITEM
1.0000 "application " | Freq: Four times a day (QID) | Status: DC | PRN
  Filled 2013-07-03: qty 1

## 2013-07-03 NOTE — Consult Note (Signed)
Referring Provider: Dr. Creola Corn Primary Care Physician:  Gwen Pounds, MD Primary Gastroenterologist:  Dr. Dorena Cookey  Reason for Consultation:  Pancreatic mass with obstructive jaundice  HPI: Carolyn [name] is a 77 y.o. female admitted to the hospital yesterday because of abdominal pain (which is now improved), pruritus, dark urine, and jaundice.  A CT scan in the emergency room showed significant biliary ductal dilatation, both intrahepatic and extrahepatic, as well as a 4 cm mass in the head of the pancreas.  Interestingly, the patient has had a couple of past episodes of upper abdominal pain attributed to pancreatitis. The more recent of these was in 2011, at which time there was a mild elevation of lipase around 84, and a CT scan with pancreatic protocol specifically showed no evidence of pancreatic mass, although slight dilatation of the pancreatic duct was noted at that time.  There has not been much in the way of prodromal GI tract symptomatology, such as any prolonged or significant weight loss or anorexia. On the contrary, the patient indicates she is very hungry today.  Interestingly, her CA 19 9 tumor antigen came back normal this morning.   Past Medical History  Diagnosis Date  . Hypertension   . Hyperlipidemia   . GERD (gastroesophageal reflux disease)   . Asthma     Past Surgical History  Procedure Laterality Date  . Neck surgery  2005/2007  . Hand surgery  2010  . Cataract extraction    . Abdominal hysterectomy      Prior to Admission medications   Medication Sig Start Date End Date Taking? Authorizing Provider  cholecalciferol (VITAMIN D) 1000 UNITS tablet Take 1,000 Units by mouth daily.   Yes Historical Provider, MD  diphenhydrAMINE (BENADRYL) 25 mg capsule Take 25 mg by mouth daily as needed for itching.   Yes Historical Provider, MD  esomeprazole (NEXIUM) 40 MG capsule Take 40 mg by mouth daily before breakfast.   Yes Historical Provider, MD   ezetimibe-simvastatin (VYTORIN) 10-40 MG per tablet Take 1 tablet by mouth daily.    Yes Historical Provider, MD  hydrochlorothiazide (HYDRODIURIL) 25 MG tablet Take 25 mg by mouth daily.   Yes Historical Provider, MD  lisinopril (PRINIVIL,ZESTRIL) 40 MG tablet Take 40 mg by mouth 2 (two) times daily.    Yes Historical Provider, MD  naproxen sodium (ANAPROX) 220 MG tablet Take 440 mg by mouth daily as needed (pain).   Yes Historical Provider, MD  verapamil (CALAN-SR) 120 MG CR tablet Take 120 mg by mouth daily.    Yes Historical Provider, MD    Current Facility-Administered Medications  Medication Dose Route Frequency Provider Last Rate Last Dose  . 0.9 %  sodium chloride infusion   Intravenous Continuous Kari Baars, MD 100 mL/hr at 07/03/13 1322    . alum & mag hydroxide-simeth (MAALOX/MYLANTA) 200-200-20 MG/5ML suspension 30 mL  30 mL Oral Q6H PRN Kari Baars, MD      . Melene Muller ON 07/04/2013] cefTRIAXone (ROCEPHIN) 1 g in dextrose 5 % 50 mL IVPB  1 g Intravenous Once Florencia Reasons, MD      . cholecalciferol (VITAMIN D) tablet 1,000 Units  1,000 Units Oral Daily Kari Baars, MD   1,000 Units at 07/03/13 1003  . diphenhydrAMINE (BENADRYL) capsule 25 mg  25 mg Oral Daily PRN Kari Baars, MD   25 mg at 07/03/13 1144  . docusate sodium (COLACE) capsule 100 mg  100 mg Oral BID Kari Baars, MD  100 mg at 07/03/13 1003  . feeding supplement (ENSURE COMPLETE) liquid 237 mL  237 mL Oral BID BM PRN Lorraine Lax, RD      . hydrOXYzine (ATARAX/VISTARIL) tablet 25 mg  25 mg Oral TID PRN Kari Baars, MD   25 mg at 07/03/13 1330  . lisinopril (PRINIVIL,ZESTRIL) tablet 40 mg  40 mg Oral BID Kari Baars, MD      . morphine 2 MG/ML injection 2 mg  2 mg Intravenous Q2H PRN Kari Baars, MD   2 mg at 07/03/13 2002  . multivitamin with minerals tablet 1 tablet  1 tablet Oral Daily Lorraine Lax, RD   1 tablet at 07/03/13 1714  . NONFORMULARY OR COMPOUNDED ITEM 1 application  1  application Topical QID PRN Florencia Reasons, MD      . ondansetron [facility]) tablet 4 mg  4 mg Oral Q6H PRN Kari Baars, MD       Or  . ondansetron [facility]) injection 4 mg  4 mg Intravenous Q6H PRN Kari Baars, MD   4 mg at 07/02/13 2113  . pantoprazole (PROTONIX) EC tablet 40 mg  40 mg Oral Daily W Buren Kos, MD   40 mg at 07/03/13 1003  . polyethylene glycol (MIRALAX / GLYCOLAX) packet 17 g  17 g Oral Daily PRN W Buren Kos, MD      . verapamil (CALAN-SR) CR tablet 120 mg  120 mg Oral Daily W Buren Kos, MD   120 mg at 07/03/13 1003  . zolpidem (AMBIEN) tablet 5 mg  5 mg Oral QHS PRN Kari Baars, MD   5 mg at 07/02/13 2214    Allergies as of 07/02/2013 - Review Complete 07/02/2013  Allergen Reaction Noted  . Other Other (See Comments) 03/12/2011    History reviewed. No pertinent family history.  History   Social History  . Marital Status: Married    Spouse Name: N/A    Number of Children: N/A  . Years of Education: N/A   Occupational History  . Not on file.   Social History Main Topics  . Smoking status: Current Some Day Smoker  . Smokeless tobacco: Not on file     Comment: PACK A WEEK  . Alcohol Use: Yes     Comment: SOMETIMES  . Drug Use: Not on file  . Sexual Activity: Not on file   Other Topics Concern  . Not on file   Social History Narrative  . No narrative on file    Review of Systems: As per history of present illness  Physical Exam: Vital signs in last 24 hours: Temp:  [97.6 F (36.4 C)-98.5 F (36.9 C)] 98.4 F (36.9 C) (09/29 2130) Pulse Rate:  [55-64] 55 (09/29 2130) Resp:  [18] 18 (09/29 2130) BP: (128-147)/(29-40) 147/38 mmHg (09/29 2130) SpO2:  [93 %-98 %] 98 % (09/29 2130) Last BM Date: 07/01/13 General:   Alert,  Well-developed, well-nourished, pleasant and cooperative in NAD, moderately overweight Head:  Normocephalic and atraumatic. Eyes: No overt scleral icterus despite significant hyperbilirubinemia Lungs:  Clear  throughout to auscultation.   No wheezes, crackles, or rhonchi. No evident respiratory distress. Heart:   Regular rate and rhythm; prominent systolic murmur, but no clicks, rubs,  or gallops. Abdomen:  Soft, nontender, nontympanitic, and nondistended but somewhat obese but. No masses, hepatosplenomegaly or ventral hernias noted.  Msk:   Symmetrical without gross deformities. Neurologic:  Alert and coherent;  grossly normal neurologically. Skin:  Intact without significant lesions or rashes. However, she is scratching because of itching. Psych:   Alert and cooperative. Normal mood and affect.  Intake/Output from previous day: 09/28 0701 - 09/29 0700 In: 1168.3 [I.V.:1168.3] Out: 200 [Urine:200] Intake/Output this shift:    Lab Results:  Recent Labs  07/02/13 1145 07/03/13 0533  WBC 12.3* 10.5  HGB 13.4 10.9*  HCT 36.8 30.8*  PLT 294 251   BMET  Recent Labs  07/02/13 1145 07/03/13 0533  NA 133* 136  K 4.0 3.8  CL 96 100  CO2 23 21  GLUCOSE 144* 80  BUN 41* 34*  CREATININE 1.28* 1.16*  CALCIUM 9.2 8.8   LFT  Recent Labs  07/03/13 0533  PROT 5.6*  ALBUMIN 2.4*  AST 282*  ALT 384*  ALKPHOS 697*  BILITOT 12.4*   PT/INR  Recent Labs  07/02/13 1830  LABPROT 12.4  INR 0.94    Studies/Results: Dg Chest 2 View  07/02/2013   CLINICAL DATA:  Fifteen. Hypertension.  Abdominal pain.  EXAM: CHEST  2 VIEW  COMPARISON:  03/06/2010  FINDINGS: The heart size and mediastinal contours are within normal limits. Both lungs are clear. Atherosclerotic calcification and mild tortuosity of thoracic aorta are stable. The visualized skeletal structures are unremarkable.  IMPRESSION: No active cardiopulmonary disease.   Electronically Signed   By: Myles Rosenthal   On: 07/02/2013 12:13   Ct Abdomen Pelvis W Contrast  07/02/2013   CLINICAL DATA:  Jaundice. Abdominal pain and vomiting.  Dark urine.  EXAM: CT ABDOMEN AND PELVIS WITH CONTRAST  TECHNIQUE: Multidetector CT imaging of the  abdomen and pelvis was performed using the standard protocol following bolus administration of intravenous contrast.  CONTRAST:  80mL OMNIPAQUE IOHEXOL 300 MG/ML  SOLN  COMPARISON:  06/13/2010  FINDINGS: Prior cholecystectomy again noted as well as multiple small hepatic cysts, however there is diffuse biliary and pancreatic ductal dilatation which is new since previous study. A heterogeneous mass is seen involving the pancreatic head and uncinate process which measures 4.1 x 3.8 x 3.2 and is highly suspicious for pancreatic carcinoma.  No evidence of peripancreatic lymphadenopathy or inflammatory changes. No soft tissue masses or lymphadenopathy seen elsewhere within the abdomen or pelvis.  Prior hysterectomy noted. Adnexal regions are unremarkable. Diverticulosis is seen mainly involving the sigmoid colon, however there is no evidence of diverticulitis. Normal appendix is visualized. No evidence of dilated bowel loops. No suspicious bone lesions identified.  IMPRESSION: Diffuse biliary and pancreatic ductal dilatation, with 4 cm mass in the pancreatic head and uncinate process, highly suspicious for pancreatic carcinoma. Consider ERCP or MRCP for further evaluation.  No evidence of metastatic disease. Stable hepatic cysts.  Diverticulosis. No radiographic evidence of diverticulitis.   Electronically Signed   By: Myles Rosenthal   On: 07/02/2013 14:32    Impression: 1. Overall picture suggestive of pancreatic cancer, including pancreatic mass, biliary dilatation, obstructive jaundice. On the other hand, the absence of significant weight loss and the normal tumor in addition would go somewhat against that diagnosis  Plan: ERCP for biliary decompression tomorrow morning. The nature, purpose, and risks of the procedure were described in detail to the patient. She understands that there is a remote chance of mortality from the procedure, and a perhaps 2-3% chance of pancreatitis, in addition to other potential  problems such as bleeding, infection, or perforation. The procedure has also been discussed in general terms with her husband.  Once biliary decompression has been accomplished, it is anticipated  that the patient will need a definitive diagnostic procedure, most likely an endoscopic ultrasound with FNA of the pancreatic mass. However, I will attempt to discuss the case with the pancreatic surgeon to see whether they would feel that such testing is necessary, instead of or prior to exploratory surgery.   LOS: 1 day   Alta Shober V  07/03/2013, 10:10 PM

## 2013-07-03 NOTE — Progress Notes (Signed)
INITIAL NUTRITION ASSESSMENT  DOCUMENTATION CODES Per approved criteria  -Severe malnutrition in the context of acute illness or injury -Obesity Unspecified  Pt meets criteria for SEVERE MALNUTRITION in the context of Acute Illness as evidenced by 11% wt loss in less than one month and energy intake <50% of estimated energy needs for >5 days.  INTERVENTION: Encourage adequate PO intake Provide Ensure Complete BID PRN Provide Multivitamin with minerals daily  NUTRITION DIAGNOSIS: Unintenitional weight loss related to pain/poor appetite as evidenced by 11% wt loss in 3 weeks per pt report.   Goal: Pt to meet >/= 90% of their estimated nutrition needs   Monitor:  PO intake Weight Diagnosis Labs  Reason for Assessment: Malnutrition Screening Tool, score of 3  77 y.o. female  Admitting Dx: Obstructive jaundice  ASSESSMENT: 77 year old African American female with a history of hypertension, recurrent pancreatitis (4/08 and 9/11), and GERD who presented to the emergency department with the complaint of abdominal pain. In the emergency department she is found to have a bilirubin of 13.9, lipase 754, AST 345, ALT 533. CT of abdomen and pelvis revealed a 4 cm mass in the head of the pancreas and uncinate process concerning for pancreatic cancer.  Pt reports that for the past 3 weeks she has primarily been eating saltine crackers; she has been in pain and unable to eat causing her to lose weight. Pt states she weighed 211 lbs 3 weeks ago. Today, she reports her appetite is fair and she ate about 50% of her meals. Encouraged pt to choose low fat foods and to eat a protein-rich food at each meal. Discussed low fat high-protein foods for pt to choose.   Nutrition Focused Physical Exam:  Subcutaneous Fat:  Orbital Region: wnl Upper Arm Region: mild wasting Thoracic and Lumbar Region: NA  Muscle:  Temple Region: wnl Clavicle Bone Region: wnl Clavicle and Acromion Bone Region:  wnl Scapular Bone Region: NA Dorsal Hand: wnl Patellar Region: wnl Anterior Thigh Region: wnl Posterior Calf Region: NA  Edema: none   Height: Ht Readings from Last 1 Encounters:  07/02/13 5\' 3"  (1.6 m)    Weight: Wt Readings from Last 1 Encounters:  07/02/13 187 lb (84.823 kg)    Ideal Body Weight: 115 lbs  % Ideal Body Weight: 163%  Wt Readings from Last 10 Encounters:  07/02/13 187 lb (84.823 kg)  05/04/13 215 lb (97.523 kg)  06/22/12 215 lb 8 oz (97.75 kg)    Usual Body Weight: 215 lbs  % Usual Body Weight: 87%  BMI:  Body mass index is 33.13 kg/(m^2).  Estimated Nutritional Needs: Kcal: 1800-2000 Protein: 100-110 grams Fluid: >/= 2 L  Skin: intact; jaundice  Diet Order: Fat Restricted  EDUCATION NEEDS: -No education needs identified at this time   Intake/Output Summary (Last 24 hours) at 07/03/13 1558 Last data filed at 07/03/13 0500  Gross per 24 hour  Intake 1168.33 ml  Output    200 ml  Net 968.33 ml    Last BM: 9/27   Labs:   Recent Labs Lab 07/02/13 1145 07/03/13 0533  NA 133* 136  K 4.0 3.8  CL 96 100  CO2 23 21  BUN 41* 34*  CREATININE 1.28* 1.16*  CALCIUM 9.2 8.8  GLUCOSE 144* 80    CBG (last 3)  No results found for this basename: GLUCAP,  in the last 72 hours  Scheduled Meds: . [START ON 07/04/2013] cefTRIAXone (ROCEPHIN)  IV  1 g Intravenous Once  . cholecalciferol  1,000 Units Oral Daily  . docusate sodium  100 mg Oral BID  . lisinopril  40 mg Oral BID  . pantoprazole  40 mg Oral Daily  . verapamil  120 mg Oral Daily    Continuous Infusions: . sodium chloride 100 mL/hr at 07/03/13 1322    Past Medical History  Diagnosis Date  . Hypertension   . Hyperlipidemia   . GERD (gastroesophageal reflux disease)   . Asthma     Past Surgical History  Procedure Laterality Date  . Neck surgery  2005/2007  . Hand surgery  2010  . Cataract extraction    . Abdominal hysterectomy      Ian Malkin RD,  LDN Inpatient Clinical Dietitian Pager: [phone] After Hours Pager: [phone]

## 2013-07-03 NOTE — Progress Notes (Signed)
Subjective: Admitted c Ab Pain, Pruritis, Elevated TBili, Elevated LFTs and new Pancreatic Mass concerning for Pancreatic CA. F/Up Obstructive Jaundice Less pain. Some itch. She is in good spirits and understands issues.  Objective: Vital signs in last 24 hours: Temp:  [97.6 F (36.4 C)-98.5 F (36.9 C)] 98.5 F (36.9 C) (09/29 1191) Pulse Rate:  [52-89] 64 (09/29 0638) Resp:  [16-22] 18 (09/29 0638) BP: (128-190)/(26-89) 134/34 mmHg (09/29 0638) SpO2:  [93 %-100 %] 93 % (09/29 4782) Weight:  [84.823 kg (187 lb)] 84.823 kg (187 lb) (09/28 1647) Weight change:  Last BM Date: 07/01/13  CBG (last 3)  No results found for this basename: GLUCAP,  in the last 72 hours  Intake/Output from previous day:  Intake/Output Summary (Last 24 hours) at 07/03/13 0721 Last data filed at 07/03/13 0500  Gross per 24 hour  Intake 1168.33 ml  Output    200 ml  Net 968.33 ml   09/28 0701 - 09/29 0700 In: 1168.3 [I.V.:1168.3] Out: 200 [Urine:200]   Physical Exam General appearance: alert and icteric  Head: Normocephalic, without obvious abnormality, atraumatic  Eyes: Sclera icterus  Neck: no adenopathy, no carotid bruit, no JVD and thyroid not enlarged, symmetric, no tenderness/mass/nodules  Resp: clear to auscultation bilaterally  Cardio: regular rate and rhythm and Grade 3/6 systolic ejection murmur  GI: soft, non-tender; bowel sounds normal; no masses, no organomegaly  Extremities: extremities normal, atraumatic, no cyanosis or edema  Neurologic: Alert and oriented X 3, normal strength and tone. Normal symmetric reflexes.  Skin: Diffuse itching noted   Lab Results:  Recent Labs  07/02/13 1145 07/03/13 0533  NA 133* 136  K 4.0 3.8  CL 96 100  CO2 23 21  GLUCOSE 144* 80  BUN 41* 34*  CREATININE 1.28* 1.16*  CALCIUM 9.2 8.8     Recent Labs  07/02/13 1145 07/03/13 0533  AST 345* 282*  ALT 533* 384*  ALKPHOS 879* 697*  BILITOT 13.9* 12.4*  PROT 6.9 5.6*  ALBUMIN  2.9* 2.4*     Recent Labs  07/02/13 1145 07/03/13 0533  WBC 12.3* 10.5  NEUTROABS 8.8*  --   HGB 13.4 10.9*  HCT 36.8 30.8*  MCV 78.5 77.6*  PLT 294 251    Lab Results  Component Value Date   INR 0.94 07/02/2013    No results found for this basename: CKTOTAL, CKMB, CKMBINDEX, TROPONINI,  in the last 72 hours  No results found for this basename: TSH, T4TOTAL, FREET3, T3FREE, THYROIDAB,  in the last 72 hours  No results found for this basename: VITAMINB12, FOLATE, FERRITIN, TIBC, IRON, RETICCTPCT,  in the last 72 hours  Micro Results: No results found for this or any previous visit (from the past 240 hour(s)).   Studies/Results: Dg Chest 2 View  07/02/2013   CLINICAL DATA:  Fifteen. Hypertension.  Abdominal pain.  EXAM: CHEST  2 VIEW  COMPARISON:  03/06/2010  FINDINGS: The heart size and mediastinal contours are within normal limits. Both lungs are clear. Atherosclerotic calcification and mild tortuosity of thoracic aorta are stable. The visualized skeletal structures are unremarkable.  IMPRESSION: No active cardiopulmonary disease.   Electronically Signed   By: Myles Rosenthal   On: 07/02/2013 12:13   Ct Abdomen Pelvis W Contrast  07/02/2013   CLINICAL DATA:  Jaundice. Abdominal pain and vomiting.  Dark urine.  EXAM: CT ABDOMEN AND PELVIS WITH CONTRAST  TECHNIQUE: Multidetector CT imaging of the abdomen and pelvis was performed using the standard protocol following  bolus administration of intravenous contrast.  CONTRAST:  80mL OMNIPAQUE IOHEXOL 300 MG/ML  SOLN  COMPARISON:  06/13/2010  FINDINGS: Prior cholecystectomy again noted as well as multiple small hepatic cysts, however there is diffuse biliary and pancreatic ductal dilatation which is new since previous study. A heterogeneous mass is seen involving the pancreatic head and uncinate process which measures 4.1 x 3.8 x 3.2 and is highly suspicious for pancreatic carcinoma.  No evidence of peripancreatic lymphadenopathy or  inflammatory changes. No soft tissue masses or lymphadenopathy seen elsewhere within the abdomen or pelvis.  Prior hysterectomy noted. Adnexal regions are unremarkable. Diverticulosis is seen mainly involving the sigmoid colon, however there is no evidence of diverticulitis. Normal appendix is visualized. No evidence of dilated bowel loops. No suspicious bone lesions identified.  IMPRESSION: Diffuse biliary and pancreatic ductal dilatation, with 4 cm mass in the pancreatic head and uncinate process, highly suspicious for pancreatic carcinoma. Consider ERCP or MRCP for further evaluation.  No evidence of metastatic disease. Stable hepatic cysts.  Diverticulosis. No radiographic evidence of diverticulitis.   Electronically Signed   By: Myles Rosenthal   On: 07/02/2013 14:32     Medications: Scheduled: . cholecalciferol  1,000 Units Oral Daily  . docusate sodium  100 mg Oral BID  . lisinopril  40 mg Oral BID  . pantoprazole  40 mg Oral Daily  . verapamil  120 mg Oral Daily   Continuous: . sodium chloride 100 mL/hr at 07/03/13 0300     Assessment/Plan: Principal Problem:   Obstructive jaundice Active Problems:   Essential hypertension   Hyperlipidemia   Pancreatic mass   Abdominal pain   Pruritus   Acute pancreatitis   Obstructive Jaundice c Ab pain and bilirubin of 13.9, lipase 754, AST 345, ALT 533. CT of abdomen and pelvis revealed a 4 cm mass in the head of the pancreas and uncinate process concerning for pancreatic cancer.  CA19-9 is only 2.4 - this is reassuring.  Await GI W/up c ERCP/stenting and Bx.  ?EUS need.  CT from 2011 did not show any tumor.  LFTs already improving c hydration but has a way to go.  WBC better.  She wants to eat.  Pruritus- secondary to jaundice. Treat with Atarax as needed.  HTN - BP currently OK.  HCT held  Recurrent pancreatitis (4/08 and 9/11) c current Lipase 754.  - IVF  GERD - PPI. Nexium at home   Hyperlipidemia- hold statin due to elevated liver  function tests.  Constipation - prns.  Mild anemia - post hydration - Follow.  DVT Prophylaxis  Dispo - Treat today and get GI procedures.  Anticipate Dc home in 24-48 hrs when #s are better and she can eat.   LOS: 1 day   Carolyn [name] M 07/03/2013, 7:21 AM

## 2013-07-04 ENCOUNTER — Encounter (HOSPITAL_COMMUNITY): Admission: EM | Disposition: A | Discharge status: Home Health | Source: Home / Self Care | Attending: Internal Medicine

## 2013-07-04 ENCOUNTER — Encounter (HOSPITAL_COMMUNITY): Payer: Self-pay | Admitting: Anesthesiology

## 2013-07-04 ENCOUNTER — Other Ambulatory Visit: Payer: Self-pay | Admitting: Gastroenterology

## 2013-07-04 ENCOUNTER — Encounter (HOSPITAL_COMMUNITY): Payer: Self-pay | Admitting: Internal Medicine

## 2013-07-04 ENCOUNTER — Inpatient Hospital Stay (HOSPITAL_COMMUNITY): Payer: Medicare Other

## 2013-07-04 ENCOUNTER — Inpatient Hospital Stay (HOSPITAL_COMMUNITY): Payer: Medicare Other | Admitting: Anesthesiology

## 2013-07-04 LAB — CBC
HCT: 30 % — ABNORMAL LOW (ref 36.0–46.0)
Hemoglobin: 10.7 g/dL — ABNORMAL LOW (ref 12.0–15.0)
MCH: 27.9 pg (ref 26.0–34.0)
MCHC: 35.7 g/dL (ref 30.0–36.0)
MCV: 78.1 fL (ref 78.0–100.0)
RDW: 16.1 % — ABNORMAL HIGH (ref 11.5–15.5)

## 2013-07-04 LAB — COMPREHENSIVE METABOLIC PANEL
ALT: 367 U/L — ABNORMAL HIGH (ref 0–35)
AST: 315 U/L — ABNORMAL HIGH (ref 0–37)
Albumin: 2.3 g/dL — ABNORMAL LOW (ref 3.5–5.2)
Alkaline Phosphatase: 716 U/L — ABNORMAL HIGH (ref 39–117)
BUN: 22 mg/dL (ref 6–23)
Chloride: 103 mEq/L (ref 96–112)
GFR calc Af Amer: 64 mL/min — ABNORMAL LOW (ref >90)
Glucose, Bld: 104 mg/dL — ABNORMAL HIGH (ref 70–99)
Potassium: 3.9 mEq/L (ref 3.5–5.1)
Sodium: 136 mEq/L (ref 135–145)
Total Protein: 5.6 g/dL — ABNORMAL LOW (ref 6.0–8.3)

## 2013-07-04 LAB — ABO/RH: ABO/RH(D): O NEG

## 2013-07-04 LAB — LIPASE, BLOOD: Lipase: 723 U/L — ABNORMAL HIGH (ref 11–59)

## 2013-07-04 SURGERY — ERCP, WITH INTERVENTION IF INDICATED
Anesthesia: General

## 2013-07-04 MED ORDER — GLYCOPYRROLATE 0.2 MG/ML IJ SOLN
INTRAMUSCULAR | Status: DC | PRN
  Administered 2013-07-04: .6 mg via INTRAVENOUS

## 2013-07-04 MED ORDER — LIDOCAINE HCL (CARDIAC) 20 MG/ML IV SOLN
INTRAVENOUS | Status: DC | PRN
  Administered 2013-07-04: 50 mg via INTRAVENOUS

## 2013-07-04 MED ORDER — GLUCAGON HCL (RDNA) 1 MG IJ SOLR
INTRAMUSCULAR | Status: DC | PRN
  Administered 2013-07-04: .5 mg via INTRAVENOUS
  Administered 2013-07-04: 1 mg via INTRAVENOUS

## 2013-07-04 MED ORDER — MIDAZOLAM HCL 5 MG/5ML IJ SOLN
INTRAMUSCULAR | Status: DC | PRN
  Administered 2013-07-04 (×2): 1 mg via INTRAVENOUS

## 2013-07-04 MED ORDER — PHENYLEPHRINE HCL 10 MG/ML IJ SOLN
INTRAMUSCULAR | Status: DC | PRN
  Administered 2013-07-04: 80 ug via INTRAVENOUS
  Administered 2013-07-04: 160 ug via INTRAVENOUS

## 2013-07-04 MED ORDER — SODIUM CHLORIDE 0.9 % IV SOLN: INTRAVENOUS | Status: DC

## 2013-07-04 MED ORDER — NEOSTIGMINE METHYLSULFATE 1 MG/ML IJ SOLN
INTRAMUSCULAR | Status: DC | PRN
  Administered 2013-07-04: 5 mg via INTRAVENOUS

## 2013-07-04 MED ORDER — SODIUM CHLORIDE 0.9 % IV SOLN
INTRAVENOUS | Status: DC | PRN
  Administered 2013-07-04: 13:00:00

## 2013-07-04 MED ORDER — ONDANSETRON HCL 4 MG/2ML IJ SOLN
INTRAMUSCULAR | Status: DC | PRN
  Administered 2013-07-04: 4 mg via INTRAVENOUS

## 2013-07-04 MED ORDER — PROPOFOL 10 MG/ML IV BOLUS
INTRAVENOUS | Status: DC | PRN
  Administered 2013-07-04: 150 mg via INTRAVENOUS

## 2013-07-04 MED ORDER — LACTATED RINGERS IV SOLN
INTRAVENOUS | Status: DC
  Administered 2013-07-04 – 2013-07-05 (×3): via INTRAVENOUS

## 2013-07-04 MED ORDER — EPHEDRINE SULFATE 50 MG/ML IJ SOLN
INTRAMUSCULAR | Status: DC | PRN
  Administered 2013-07-04: 10 mg via INTRAVENOUS

## 2013-07-04 MED ORDER — FENTANYL CITRATE 0.05 MG/ML IJ SOLN
INTRAMUSCULAR | Status: DC | PRN
  Administered 2013-07-04: 50 ug via INTRAVENOUS

## 2013-07-04 MED ORDER — PHENOL 1.4 % MT LIQD: 1.0000 | OROMUCOSAL | Status: DC | PRN

## 2013-07-04 MED ORDER — CIPROFLOXACIN IN D5W 400 MG/200ML IV SOLN
INTRAVENOUS | Status: AC
  Filled 2013-07-04: qty 200

## 2013-07-04 MED ORDER — CIPROFLOXACIN IN D5W 400 MG/200ML IV SOLN
INTRAVENOUS | Status: DC | PRN
  Administered 2013-07-04: 400 mg via INTRAVENOUS

## 2013-07-04 MED ORDER — SUCCINYLCHOLINE CHLORIDE 20 MG/ML IJ SOLN
INTRAMUSCULAR | Status: DC | PRN
  Administered 2013-07-04: 100 mg via INTRAVENOUS

## 2013-07-04 MED ORDER — GLUCAGON HCL (RDNA) 1 MG IJ SOLR
INTRAMUSCULAR | Status: AC
  Filled 2013-07-04: qty 1

## 2013-07-04 MED ORDER — ROCURONIUM BROMIDE 100 MG/10ML IV SOLN
INTRAVENOUS | Status: DC | PRN
  Administered 2013-07-04: 10 mg via INTRAVENOUS
  Administered 2013-07-04: 25 mg via INTRAVENOUS
  Administered 2013-07-04: 5 mg via INTRAVENOUS

## 2013-07-04 NOTE — Progress Notes (Signed)
Carolyn [name] 10:29 AM  Subjective: Patient currently asymptomatic and our ERCP was rediscussed including the risks and benefits and success rate And she has no other complaints or question  Objective: Vital signs stable afebrile no acute distress exam please see pre-assessment evaluation labs reviewed CT reviewed  Assessment: CBD obstruction secondary to pancreatic mass  Plan: Okay to proceed with ERCP and anesthesia  Carolyn [name] E

## 2013-07-04 NOTE — Transfer of Care (Signed)
Immediate Anesthesia Transfer of Care Note  Patient: Carolyn [name]  Procedure(s) Performed: Procedure(s) with comments: ENDOSCOPIC RETROGRADE CHOLANGIOPANCREATOGRAPHY (ERCP) (N/A) - Probable stent placement  Patient Location: PACU  Anesthesia Type:General  Level of Consciousness: sedated  Airway & Oxygen Therapy: Patient Spontanous Breathing and Patient connected to face mask oxygen  Post-op Assessment: Report given to PACU RN and Post -op Vital signs reviewed and stable  Post vital signs: Reviewed and stable  Complications: No apparent anesthesia complications

## 2013-07-04 NOTE — Progress Notes (Signed)
Pt states skin cream has helped itching a lot.  No abd pain at this time, following unsuccessful attempt at ERCP.  Plan for EUS, ?repeat attempt at ERCP for tomorrow noted.  Florencia Reasons, M.D. [phone]

## 2013-07-04 NOTE — Anesthesia Preprocedure Evaluation (Signed)
Anesthesia Evaluation  Patient identified by MRN, date of birth, ID band Patient awake    Reviewed: Allergy & Precautions, H&P , NPO status , Patient's Chart, lab work & pertinent test results  Airway Mallampati: II TM Distance: >3 FB Neck ROM: Full    Dental no notable dental hx.    Pulmonary asthma ,  breath sounds clear to auscultation  Pulmonary exam normal       Cardiovascular hypertension, Pt. on medications Rhythm:Regular Rate:Normal     Neuro/Psych negative neurological ROS  negative psych ROS   GI/Hepatic Neg liver ROS, GERD-  Medicated,  Endo/Other  negative endocrine ROS  Renal/GU negative Renal ROS  negative genitourinary   Musculoskeletal negative musculoskeletal ROS (+)   Abdominal   Peds negative pediatric ROS (+)  Hematology negative hematology ROS (+)   Anesthesia Other Findings   Reproductive/Obstetrics negative OB ROS                           Anesthesia Physical Anesthesia Plan  ASA: III  Anesthesia Plan: General   Post-op Pain Management:    Induction: Intravenous  Airway Management Planned: Oral ETT  Additional Equipment:   Intra-op Plan:   Post-operative Plan: Extubation in OR  Informed Consent: I have reviewed the patients History and Physical, chart, labs and discussed the procedure including the risks, benefits and alternatives for the proposed anesthesia with the patient or authorized representative who has indicated his/her understanding and acceptance.   Dental advisory given  Plan Discussed with: CRNA  Anesthesia Plan Comments:         Anesthesia Quick Evaluation

## 2013-07-04 NOTE — Op Note (Signed)
[facility] [address] [place], [address]   ERCP PROCEDURE REPORT  PATIENT: Carolyn [name], Carolyn [name].  MR# :[id number] BIRTHDATE: [birth year]  GENDER: Female ENDOSCOPIST: Vida Rigger, MD REFERRED BY: Kari Baars, M.D. PROCEDURE DATE:  07/04/2013 PROCEDURE:   Endoscopic catheterization of pancreatic duct, ERCP with cannulation of papilla, and ERCP with sphincterotomy/papillotomy ASA CLASS:    2 INDICATIONS: CBD obstruction pancreatic mass MEDICATIONS:    per anesthesia TOPICAL ANESTHETIC:  no  DESCRIPTION OF PROCEDURE:   After the risks benefits and alternatives of the procedure were thoroughly explained, informed consent was obtained.  The Pentax ERCP [id number]  endoscope was introduced through the mouth and advanced to the second portion of the duodenum .a normal appearing ampulla with a long intraduodenal segment was brought into view and using the triple-lumen sphincterotome loaded with the JAGwire we proceeded with multiple attempts at cannulation we did unfortunately get a minimal pancreatic injection one time and did advance the wire one time into the pancreas but were unable to maintain position and place a pancreatic stent we then switched to the smaller sphincterotome loaded with smaller wire but again were unsuccessful in obtaining CBD cannulation and unfortunately formed a false channel and despite a prolonged effort we were unable to cannulate either duct any further. We also proceeded with needle-knife sphincterotomy in the customary fashion along the proximal part of the duodenal segment and did get a trace of bile but were unable to pass the wire and after our prolonged effort we elected to stop the procedure at this point and the patient tolerated the procedure well there was no obvious immediate complication           COMPLICATIONS:  no immediate one ENDOSCOPIC IMPRESSION:1. Normal ampulla 2. 1 minimal pancreatic duct injectionandone  wire advancedment towards the pancreas 3. False channel formed 4 unable to cannulate the CBD despite attempts at needle-knife sphincterotomy RECOMMENDATIONS:observe for delayed complications if not my partner Dr. Dulce Sellar will proceed with a EUS tomorrow and possible a retry ERCP pending findings     _______________________________ eSigned:  Vida Rigger, MD 07/04/2013 12:38 PM   CC:W.  Buren Kos, MD

## 2013-07-04 NOTE — Progress Notes (Signed)
Still a little sleepy in recovery

## 2013-07-04 NOTE — Progress Notes (Signed)
RN was concerned about medicating pt with her Lisinopril 40 mg PO scheduled at 2200 with her DBP consistently running in the 30's-40's range, SBP on the other hand were in the 140's. Dr. Evlyn Kanner, MD on call from [facility] paged and informed of pt's current BP's;  MD stated to go ahead and give her medication. FYI.

## 2013-07-04 NOTE — Progress Notes (Signed)
Subjective:  F/Up Obstructive Jaundice Less pain. Tolerated some diet yesterday Some but less itch. She is in good spirits and understands issues. She is not getting much sleep here in the hospital. Appreciate Dr Buccini's input  Objective: Vital signs in last 24 hours: Temp:  [97.6 F (36.4 C)-98.4 F (36.9 C)] 98.2 F (36.8 C) (09/30 0549) Pulse Rate:  [55-60] 55 (09/30 0549) Resp:  [18] 18 (09/30 0549) BP: (133-147)/(29-43) 133/43 mmHg (09/30 0549) SpO2:  [93 %-98 %] 98 % (09/30 0549) Weight change:  Last BM Date: 07/01/13  CBG (last 3)  No results found for this basename: GLUCAP,  in the last 72 hours  Intake/Output from previous day:  Intake/Output Summary (Last 24 hours) at 07/04/13 0737 Last data filed at 07/03/13 1430  Gross per 24 hour  Intake    950 ml  Output      0 ml  Net    950 ml   09/29 0701 - 09/30 0700 In: 950 [I.V.:950] Out: -    Physical Exam General appearance: alert and less icteric  Head: Normocephalic, without obvious abnormality, atraumatic  Eyes: Less Sclera icterus  Neck: no adenopathy, no carotid bruit, no JVD and thyroid not enlarged, symmetric, no tenderness/mass/nodules  Resp: clear to auscultation bilaterally  Cardio: regular rate and rhythm and Grade 2/6 systolic ejection murmur  GI: soft, non-tender; bowel sounds normal; no masses, no organomegaly  Extremities: extremities normal, atraumatic, no cyanosis or edema  Neurologic: Alert and oriented X 3, normal strength and tone. Normal symmetric reflexes.  Skin: less itching noted   Lab Results:  Recent Labs  07/02/13 1145 07/03/13 0533  NA 133* 136  K 4.0 3.8  CL 96 100  CO2 23 21  GLUCOSE 144* 80  BUN 41* 34*  CREATININE 1.28* 1.16*  CALCIUM 9.2 8.8     Recent Labs  07/02/13 1145 07/03/13 0533  AST 345* 282*  ALT 533* 384*  ALKPHOS 879* 697*  BILITOT 13.9* 12.4*  PROT 6.9 5.6*  ALBUMIN 2.9* 2.4*     Recent Labs  07/02/13 1145 07/03/13 0533  WBC  12.3* 10.5  NEUTROABS 8.8*  --   HGB 13.4 10.9*  HCT 36.8 30.8*  MCV 78.5 77.6*  PLT 294 251    Lab Results  Component Value Date   INR 0.94 07/02/2013    No results found for this basename: CKTOTAL, CKMB, CKMBINDEX, TROPONINI,  in the last 72 hours   Recent Labs  07/02/13 1830  TSH 0.160*    No results found for this basename: VITAMINB12, FOLATE, FERRITIN, TIBC, IRON, RETICCTPCT,  in the last 72 hours  Micro Results: No results found for this or any previous visit (from the past 240 hour(s)).   Studies/Results: Dg Chest 2 View  07/02/2013   CLINICAL DATA:  Fifteen. Hypertension.  Abdominal pain.  EXAM: CHEST  2 VIEW  COMPARISON:  03/06/2010  FINDINGS: The heart size and mediastinal contours are within normal limits. Both lungs are clear. Atherosclerotic calcification and mild tortuosity of thoracic aorta are stable. The visualized skeletal structures are unremarkable.  IMPRESSION: No active cardiopulmonary disease.   Electronically Signed   By: Myles Rosenthal   On: 07/02/2013 12:13   Ct Abdomen Pelvis W Contrast  07/02/2013   CLINICAL DATA:  Jaundice. Abdominal pain and vomiting.  Dark urine.  EXAM: CT ABDOMEN AND PELVIS WITH CONTRAST  TECHNIQUE: Multidetector CT imaging of the abdomen and pelvis was performed using the standard protocol following bolus administration of intravenous  contrast.  CONTRAST:  80mL OMNIPAQUE IOHEXOL 300 MG/ML  SOLN  COMPARISON:  06/13/2010  FINDINGS: Prior cholecystectomy again noted as well as multiple small hepatic cysts, however there is diffuse biliary and pancreatic ductal dilatation which is new since previous study. A heterogeneous mass is seen involving the pancreatic head and uncinate process which measures 4.1 x 3.8 x 3.2 and is highly suspicious for pancreatic carcinoma.  No evidence of peripancreatic lymphadenopathy or inflammatory changes. No soft tissue masses or lymphadenopathy seen elsewhere within the abdomen or pelvis.  Prior hysterectomy  noted. Adnexal regions are unremarkable. Diverticulosis is seen mainly involving the sigmoid colon, however there is no evidence of diverticulitis. Normal appendix is visualized. No evidence of dilated bowel loops. No suspicious bone lesions identified.  IMPRESSION: Diffuse biliary and pancreatic ductal dilatation, with 4 cm mass in the pancreatic head and uncinate process, highly suspicious for pancreatic carcinoma. Consider ERCP or MRCP for further evaluation.  No evidence of metastatic disease. Stable hepatic cysts.  Diverticulosis. No radiographic evidence of diverticulitis.   Electronically Signed   By: Myles Rosenthal   On: 07/02/2013 14:32     Medications: Scheduled: . cholecalciferol  1,000 Units Oral Daily  . docusate sodium  100 mg Oral BID  . lisinopril  40 mg Oral BID  . multivitamin with minerals  1 tablet Oral Daily  . pantoprazole  40 mg Oral Daily  . verapamil  120 mg Oral Daily   Continuous: . sodium chloride 100 mL/hr at 07/03/13 2222     Assessment/Plan: Principal Problem:   Obstructive jaundice Active Problems:   Essential hypertension   Hyperlipidemia   Pancreatic mass   Abdominal pain   Pruritus   Acute pancreatitis   Protein-calorie malnutrition, severe   Obstructive Jaundice c Ab pain and bilirubin of 13.9, lipase 754, AST 345, ALT 533. CT of abdomen and pelvis revealed a 4 cm mass in the head of the pancreas and uncinate process concerning for pancreatic cancer.  CA19-9 is only 2.4 - this is reassuring.  For ERCP/stenting today and possible EUS Bx Wednesday or as outpt..  CT from 2011 did not show any tumor.  LFTs already improving c hydration and labs today are Pending  Pruritus- secondary to jaundice. Treat with Atarax as needed.  HTN - BP currently OK.  HCT held. DBP low but not Sxatic.  Recurrent pancreatitis (4/08 and 9/11) c last Lipase 754.  - IVF  GERD - PPI. Nexium at home   Non Toxic Multinodular goiter/Thyromegaly - Thyroid bx--Follicular Lesion   TSH 0.160 but OK- following    Hyperlipidemia- hold statin due to elevated liver function tests.  Constipation - prns.  Mild anemia - post hydration - Follow.  DVT Prophylaxis  Dispo - Treat today and get GI procedures.  Anticipate Dc home in 24-48 hrs when #s are better and she can eat.  SEVERE MALNUTRITION in the context of Acute Illness - See Nutrition notes.     LOS: 2 days   Koi Yarbro M 07/04/2013, 7:37 AM

## 2013-07-04 NOTE — Anesthesia Postprocedure Evaluation (Signed)
  Anesthesia Post-op Note  Patient: Carolyn [name]  Procedure(s) Performed: Procedure(s) (LRB): ENDOSCOPIC RETROGRADE CHOLANGIOPANCREATOGRAPHY (ERCP) (N/A)  Patient Location: PACU  Anesthesia Type: General  Level of Consciousness: awake and alert   Airway and Oxygen Therapy: Patient Spontanous Breathing  Post-op Pain: mild  Post-op Assessment: Post-op Vital signs reviewed, Patient's Cardiovascular Status Stable, Respiratory Function Stable, Patent Airway and No signs of Nausea or vomiting  Last Vitals:  Filed Vitals:   07/04/13 1417  BP:   Pulse:   Temp:   Resp: 18    Post-op Vital Signs: stable   Complications: No apparent anesthesia complications

## 2013-07-05 ENCOUNTER — Encounter (HOSPITAL_COMMUNITY): Payer: Self-pay | Admitting: Anesthesiology

## 2013-07-05 ENCOUNTER — Inpatient Hospital Stay (HOSPITAL_COMMUNITY): Payer: Medicare Other | Admitting: Anesthesiology

## 2013-07-05 ENCOUNTER — Encounter (HOSPITAL_COMMUNITY)
Admission: EM | Disposition: A | Discharge status: Home Health | Payer: Self-pay | Source: Home / Self Care | Attending: Internal Medicine

## 2013-07-05 ENCOUNTER — Encounter (HOSPITAL_COMMUNITY): Payer: Self-pay | Admitting: *Deleted

## 2013-07-05 ENCOUNTER — Inpatient Hospital Stay (HOSPITAL_COMMUNITY): Payer: Medicare Other

## 2013-07-05 DIAGNOSIS — C259 Malignant neoplasm of pancreas, unspecified: Secondary | ICD-10-CM

## 2013-07-05 HISTORY — DX: Malignant neoplasm of pancreas, unspecified: C25.9

## 2013-07-05 HISTORY — PX: ERCP: SHX5425

## 2013-07-05 HISTORY — PX: EUS: SHX5427

## 2013-07-05 LAB — CBC WITH DIFFERENTIAL/PLATELET
Basophils Relative: 1 % (ref 0–1)
Eosinophils Relative: 4 % (ref 0–5)
HCT: 29.6 % — ABNORMAL LOW (ref 36.0–46.0)
Hemoglobin: 10.1 g/dL — ABNORMAL LOW (ref 12.0–15.0)
Lymphs Abs: 2.2 10*3/uL (ref 0.7–4.0)
MCH: 27.2 pg (ref 26.0–34.0)
MCV: 79.8 fL (ref 78.0–100.0)
Monocytes Relative: 8 % (ref 3–12)
Neutro Abs: 6.8 10*3/uL (ref 1.7–7.7)
RBC: 3.71 MIL/uL — ABNORMAL LOW (ref 3.87–5.11)

## 2013-07-05 LAB — COMPREHENSIVE METABOLIC PANEL
AST: 344 U/L — ABNORMAL HIGH (ref 0–37)
Albumin: 2.2 g/dL — ABNORMAL LOW (ref 3.5–5.2)
BUN: 18 mg/dL (ref 6–23)
CO2: 24 mEq/L (ref 19–32)
Calcium: 8.8 mg/dL (ref 8.4–10.5)
Chloride: 104 mEq/L (ref 96–112)
Creatinine, Ser: 0.98 mg/dL (ref 0.50–1.10)
GFR calc Af Amer: 64 mL/min — ABNORMAL LOW (ref >90)
Potassium: 3.9 mEq/L (ref 3.5–5.1)
Sodium: 139 mEq/L (ref 135–145)
Total Protein: 5.3 g/dL — ABNORMAL LOW (ref 6.0–8.3)

## 2013-07-05 SURGERY — ESOPHAGEAL ENDOSCOPIC ULTRASOUND (EUS) RADIAL
Anesthesia: General

## 2013-07-05 MED ORDER — SUCCINYLCHOLINE CHLORIDE 20 MG/ML IJ SOLN
INTRAMUSCULAR | Status: DC | PRN
  Administered 2013-07-05: 100 mg via INTRAVENOUS

## 2013-07-05 MED ORDER — PROMETHAZINE HCL 25 MG/ML IJ SOLN: 6.2500 mg | INTRAMUSCULAR | Status: DC | PRN

## 2013-07-05 MED ORDER — ALBUTEROL SULFATE HFA 108 (90 BASE) MCG/ACT IN AERS
INHALATION_SPRAY | RESPIRATORY_TRACT | Status: DC | PRN
  Administered 2013-07-05: 5 via RESPIRATORY_TRACT

## 2013-07-05 MED ORDER — METOCLOPRAMIDE HCL 5 MG/ML IJ SOLN
INTRAMUSCULAR | Status: DC | PRN
  Administered 2013-07-05: 10 mg via INTRAVENOUS

## 2013-07-05 MED ORDER — KETAMINE HCL 10 MG/ML IJ SOLN
INTRAMUSCULAR | Status: DC | PRN
  Administered 2013-07-05 (×2): 50 mg via INTRAVENOUS

## 2013-07-05 MED ORDER — MEPERIDINE HCL 25 MG/ML IJ SOLN: 6.2500 mg | INTRAMUSCULAR | Status: DC | PRN

## 2013-07-05 MED ORDER — PROPOFOL 10 MG/ML IV BOLUS
INTRAVENOUS | Status: DC | PRN
  Administered 2013-07-05: 150 mg via INTRAVENOUS

## 2013-07-05 MED ORDER — ONDANSETRON HCL 4 MG/2ML IJ SOLN
INTRAMUSCULAR | Status: DC | PRN
  Administered 2013-07-05: 4 mg via INTRAVENOUS

## 2013-07-05 MED ORDER — MIDAZOLAM HCL 5 MG/5ML IJ SOLN
INTRAMUSCULAR | Status: DC | PRN
  Administered 2013-07-05: 2 mg via INTRAVENOUS

## 2013-07-05 MED ORDER — CIPROFLOXACIN IN D5W 400 MG/200ML IV SOLN
INTRAVENOUS | Status: AC
  Filled 2013-07-05: qty 200

## 2013-07-05 MED ORDER — GLUCAGON HCL (RDNA) 1 MG IJ SOLR
INTRAMUSCULAR | Status: AC
  Filled 2013-07-05: qty 1

## 2013-07-05 MED ORDER — CIPROFLOXACIN IN D5W 400 MG/200ML IV SOLN
400.0000 mg | Freq: Once | INTRAVENOUS | Status: AC
  Administered 2013-07-05: 400 mg via INTRAVENOUS

## 2013-07-05 MED ORDER — SODIUM CHLORIDE 0.9 % IV SOLN: INTRAVENOUS | Status: DC

## 2013-07-05 MED ORDER — FENTANYL CITRATE 0.05 MG/ML IJ SOLN
INTRAMUSCULAR | Status: DC | PRN
  Administered 2013-07-05 (×2): 25 ug via INTRAVENOUS

## 2013-07-05 MED ORDER — GLUCAGON HCL (RDNA) 1 MG IJ SOLR
INTRAMUSCULAR | Status: DC | PRN
  Administered 2013-07-05 (×2): .5 mg via INTRAVENOUS

## 2013-07-05 MED ORDER — FENTANYL CITRATE 0.05 MG/ML IJ SOLN: 25.0000 ug | INTRAMUSCULAR | Status: DC | PRN

## 2013-07-05 MED ORDER — DEXAMETHASONE SODIUM PHOSPHATE 4 MG/ML IJ SOLN
INTRAMUSCULAR | Status: DC | PRN
  Administered 2013-07-05: 10 mg via INTRAVENOUS

## 2013-07-05 NOTE — Transfer of Care (Signed)
Immediate Anesthesia Transfer of Care Note  Patient: Carolyn [name]  Procedure(s) Performed: Procedure(s): ESOPHAGEAL ENDOSCOPIC ULTRASOUND (EUS) RADIAL (N/A) ENDOSCOPIC RETROGRADE CHOLANGIOPANCREATOGRAPHY (ERCP) (N/A)  Patient Location: PACU, Endo  Anesthesia Type:General  Level of Consciousness: Patient easily awoken, sedated, comfortable, cooperative, following commands, responds to stimulation.   Airway & Oxygen Therapy: Patient spontaneously breathing, ventilating well, oxygen via simple oxygen mask.  Post-op Assessment: Report given to PACU RN, vital signs reviewed and stable, moving all extremities.   Post vital signs: Reviewed and stable.  Complications: No apparent anesthesia complications

## 2013-07-05 NOTE — Anesthesia Preprocedure Evaluation (Signed)
Anesthesia Evaluation  Patient identified by MRN, date of birth, ID band Patient awake    Reviewed: Allergy & Precautions, H&P , NPO status , Patient's Chart, lab work & pertinent test results  Airway Mallampati: II TM Distance: >3 FB Neck ROM: Full    Dental no notable dental hx.    Pulmonary asthma ,  breath sounds clear to auscultation  Pulmonary exam normal       Cardiovascular hypertension, Pt. on medications Rhythm:Regular Rate:Normal     Neuro/Psych negative neurological ROS  negative psych ROS   GI/Hepatic Neg liver ROS, GERD-  Medicated,  Endo/Other  negative endocrine ROS  Renal/GU negative Renal ROS  negative genitourinary   Musculoskeletal negative musculoskeletal ROS (+)   Abdominal   Peds negative pediatric ROS (+)  Hematology negative hematology ROS (+)   Anesthesia Other Findings   Reproductive/Obstetrics negative OB ROS                           Anesthesia Physical Anesthesia Plan  ASA: III  Anesthesia Plan: General   Post-op Pain Management:    Induction: Intravenous  Airway Management Planned: Oral ETT  Additional Equipment:   Intra-op Plan:   Post-operative Plan: Extubation in OR  Informed Consent: I have reviewed the patients History and Physical, chart, labs and discussed the procedure including the risks, benefits and alternatives for the proposed anesthesia with the patient or authorized representative who has indicated his/her understanding and acceptance.   Dental advisory given  Plan Discussed with: CRNA  Anesthesia Plan Comments:         Anesthesia Quick Evaluation  

## 2013-07-05 NOTE — Op Note (Signed)
Patient stable, VS at baseline.  Patient transported to [facility] via [facility]. Report called to Tawni Millers RN at [facility].

## 2013-07-05 NOTE — Op Note (Signed)
[facility] [address] [place], [address]   ENDOSCOPIC ULTRASOUND PROCEDURE REPORT  PATIENT: Carolyn [name], Carolyn [name].  MR#: [id number] BIRTHDATE: [birth year]  GENDER: Female ENDOSCOPIST: Willis Modena, MD REFERRED BY:  Roseanne Kaufman, M.D. PROCEDURE DATE:  07/05/2013 PROCEDURE:   Upper EUS w/FNA ASA CLASS:      Class III INDICATIONS:   1.  obstructive jaundice, pancreatic mass. MEDICATIONS: MAC sedation, administered by CRNA  DESCRIPTION OF PROCEDURE:   After the risks benefits and alternatives of the procedure were  explained, informed consent was obtained. The patient was then placed in the left, lateral, decubitus postion and IV sedation was administered. Throughout the procedure, the patients blood pressure, pulse and oxygen saturations were monitored continuously.  Under direct visualization, the     endoscope was introduced through the mouth and advanced to the bulb of duodenum .  Water was used as necessary to provide an acoustic interface.  Upon completion of the imaging, water was removed and the patient was sent to the recovery room in satisfactory condition.    FINDINGS:      Round ill-defined hypoechoic mass, about 25mm  x 25mm in size, seen in head of pancreas.  Lesion appears to closely abut, or even possibly superficially invade, the portal/SMV confluence. No neighboring adenopathy.  No involvement of the celiac artery or SMA.  Bile and pancreatic ducts dilated upstream of the mass.  Low cystic duct take-off, with non-shadowing stone or polyp within the distal portion.  Lesion biopsied x 3 with 25g needle, preliminary cytology, reviewed in my presence by Dr. Dierdre Searles, worrisome for adenocarcinoma.  IMPRESSION:     Pancreatic mass, biopsied as above.  RECOMMENDATIONS:     1.  Watch for potential complications of procedure. 2.  Await cytology results. 3.  Proceed with retry ERCP today.   _______________________________ Rosalie DoctorWillis Modena, MD  07/05/2013 11:51 AM   CC:

## 2013-07-05 NOTE — Op Note (Signed)
[facility] [address] [place], [address]   ERCP PROCEDURE REPORT  PATIENT: [name], Carolyn.  MR# :[id number] BIRTHDATE: 17-Oct-1935  GENDER: Female ENDOSCOPIST: Willis Modena, MD REFERRED BY: Roseanne Kaufman, M.D. PROCEDURE DATE:  07/05/2013 PROCEDURE:   Endoscopy (failed ERCP) ASA CLASS:    ASA-III INDICATIONS: Obstructive jaundice, pancreatic mass MEDICATIONS:    Cipro 400 mg IV, general endotracheal anesthesia  DESCRIPTION OF PROCEDURE:   After the risks benefits and alternatives of the procedure were thoroughly explained, informed consent was obtained.  The Pentax ERCP [id number]  endoscope was introduced through the mouth and advanced to the second portion of the duodenum .      FINDINGS:  Extensive periampullary edema, and evidence of prior needle knife papillotomy.  Extensive efforts were made for biliary cannulation, with patient in both left lateral and prone positions. Unfortunately, despite these efforts, biliary cannulation was not achieved.  ENDOSCOPIC IMPRESSION:  Pancreatic mass with biliary obstruction. Now with two failed ERCP attempts.  RECOMMENDATIONS: 1.  Watch for potential complications of procedure. 2.  Will need percutaneous biliary drain placement by IR, for biliary decompression. 3.  Await final cytology results. 4.  Surgical consultation. 5.  [facility] inpatient team to follow.     _______________________________ Rosalie DoctorWillis Modena, MD 07/05/2013 1:02 PM   CC:

## 2013-07-05 NOTE — Progress Notes (Signed)
Subjective:  F/Up Obstructive Jaundice Less pain. Some but less itch - creams helping. S/P failed Stenting. For EUS and retry today Biggest complaints are getting constipated but now having mild urge and poor sleep.  Objective: Vital signs in last 24 hours: Temp:  [97.7 F (36.5 C)-99.1 F (37.3 C)] 99.1 F (37.3 C) (09/30 2228) Pulse Rate:  [50-75] 74 (09/30 2228) Resp:  [14-21] 18 (09/30 2228) BP: (136-165)/(43-92) 162/60 mmHg (09/30 2228) SpO2:  [92 %-100 %] 97 % (09/30 2228) Weight change:  Last BM Date: 07/01/13  CBG (last 3)  No results found for this basename: GLUCAP,  in the last 72 hours  Intake/Output from previous day:  Intake/Output Summary (Last 24 hours) at 07/05/13 0604 Last data filed at 07/04/13 2200  Gross per 24 hour  Intake   1701 ml  Output      0 ml  Net   1701 ml   09/30 0701 - 10/01 0700 In: 1701 [I.V.:1701] Out: -    Physical Exam General appearance: alert and less icteric  Eyes: Less Sclera icterus  Resp: clear to auscultation bilaterally  Cardio: regular rate and rhythm and Grade 2/6 systolic ejection murmur  GI: soft, non-tender; bowel sounds normal; no masses, no organomegaly  Extremities: extremities normal, atraumatic, no cyanosis or edema  Neurologic: Alert and oriented X 3, normal strength and tone. Normal symmetric reflexes.  Skin: less itching noted   Lab Results:  Recent Labs  07/03/13 0533 07/04/13 0708  NA 136 136  K 3.8 3.9  CL 100 103  CO2 21 24  GLUCOSE 80 104*  BUN 34* 22  CREATININE 1.16* 0.98  CALCIUM 8.8 8.9     Recent Labs  07/03/13 0533 07/04/13 0708  AST 282* 315*  ALT 384* 367*  ALKPHOS 697* 716*  BILITOT 12.4* 12.6*  PROT 5.6* 5.6*  ALBUMIN 2.4* 2.3*     Recent Labs  07/02/13 1145 07/03/13 0533 07/04/13 0708  WBC 12.3* 10.5 10.5  NEUTROABS 8.8*  --   --   HGB 13.4 10.9* 10.7*  HCT 36.8 30.8* 30.0*  MCV 78.5 77.6* 78.1  PLT 294 251 233    Lab Results  Component Value Date   INR 0.94 07/02/2013    No results found for this basename: CKTOTAL, CKMB, CKMBINDEX, TROPONINI,  in the last 72 hours   Recent Labs  07/02/13 1830  TSH 0.160*    No results found for this basename: VITAMINB12, FOLATE, FERRITIN, TIBC, IRON, RETICCTPCT,  in the last 72 hours  Micro Results: No results found for this or any previous visit (from the past 240 hour(s)).   Studies/Results: Dg C-arm 61-120 Min-no Report  07/04/2013   CLINICAL DATA: pancreatic mass   C-ARM 61-120 MINUTES  Fluoroscopy was utilized by the requesting physician.  No radiographic  interpretation.      Medications: Scheduled: . cholecalciferol  1,000 Units Oral Daily  . docusate sodium  100 mg Oral BID  . lisinopril  40 mg Oral BID  . multivitamin with minerals  1 tablet Oral Daily  . pantoprazole  40 mg Oral Daily  . verapamil  120 mg Oral Daily   Continuous: . sodium chloride Stopped (07/04/13 0733)  . lactated ringers       Assessment/Plan: Principal Problem:   Obstructive jaundice Active Problems:   Essential hypertension   Hyperlipidemia   Pancreatic mass   Abdominal pain   Pruritus   Acute pancreatitis   Protein-calorie malnutrition, severe   Obstructive Jaundice c  Ab pain and admitted c bilirubin of 13.9, lipase 754, AST 345, ALT 533. CT of abdomen and pelvis revealed a 4 cm mass in the head of the pancreas and uncinate process concerning for pancreatic cancer.  CA19-9 is only 2.4 - this is reassuring.  S/p multiple failed attempts yesterday for ERCP/stenting - aborted due to pancreas obstruction was too sig.  Re-attempt today c possible EUS Bx.  If this fails what are are options - Surgery? Tertiary care center? Percutaneous Drainage? CT from 2011 did not show any tumor.  LFTs improving slowly c hydration as AST was 315 and ALT was 367 yesterday c T Bili 12.6.  Anticipate better improvement after stent is in.  Todays labs are Pending.  Pancreatitis c Lipase still 723 but not in pain.  Recurrent pancreatitis (4/08 and 9/11).  ?enzyme leak with the obstruction.  Continue IVF.  After stenting this # should decrease.  Pruritus- secondary to jaundice. Treat with Atarax and creams as needed.  HTN - BP currently OK to a little high.  HCT held. DBP low but not Sxatic.  GERD - PPI. Nexium at home   Non Toxic Multinodular goiter/Thyromegaly - Thyroid bx--Follicular Lesion  TSH 0.160 but OK- following    Hyperlipidemia- hold statin due to elevated liver function tests.  Constipation - prns.  Mild anemia - post hydration - Follow. 13.4 - 10.9 - 10.7  DVT Prophylaxis  Dispo remains the same - Treat today and get GI procedures.  Anticipate Dc home in 24-48 hrs when #s are better and she can eat.  SEVERE MALNUTRITION in the context of Acute Illness - See Nutrition notes.     LOS: 3 days   Casmer Yepiz M 07/05/2013, 6:04 AM

## 2013-07-05 NOTE — Preoperative (Signed)
Beta Blockers   Reason not to administer Beta Blockers:Not Applicable, not on home BB 

## 2013-07-05 NOTE — H&P (View-Only) (Signed)
Subjective:  F/Up Obstructive Jaundice Less pain. Some but less itch - creams helping. S/P failed Stenting. For EUS and retry today Biggest complaints are getting constipated but now having mild urge and poor sleep.  Objective: Vital signs in last 24 hours: Temp:  [97.7 F (36.5 C)-99.1 F (37.3 C)] 99.1 F (37.3 C) (09/30 2228) Pulse Rate:  [50-75] 74 (09/30 2228) Resp:  [14-21] 18 (09/30 2228) BP: (136-165)/(43-92) 162/60 mmHg (09/30 2228) SpO2:  [92 %-100 %] 97 % (09/30 2228) Weight change:  Last BM Date: 07/01/13  CBG (last 3)  No results found for this basename: GLUCAP,  in the last 72 hours  Intake/Output from previous day:  Intake/Output Summary (Last 24 hours) at 07/05/13 0604 Last data filed at 07/04/13 2200  Gross per 24 hour  Intake   1701 ml  Output      0 ml  Net   1701 ml   09/30 0701 - 10/01 0700 In: 1701 [I.V.:1701] Out: -    Physical Exam General appearance: alert and less icteric  Eyes: Less Sclera icterus  Resp: clear to auscultation bilaterally  Cardio: regular rate and rhythm and Grade 2/6 systolic ejection murmur  GI: soft, non-tender; bowel sounds normal; no masses, no organomegaly  Extremities: extremities normal, atraumatic, no cyanosis or edema  Neurologic: Alert and oriented X 3, normal strength and tone. Normal symmetric reflexes.  Skin: less itching noted   Lab Results:  Recent Labs  07/03/13 0533 07/04/13 0708  NA 136 136  K 3.8 3.9  CL 100 103  CO2 21 24  GLUCOSE 80 104*  BUN 34* 22  CREATININE 1.16* 0.98  CALCIUM 8.8 8.9     Recent Labs  07/03/13 0533 07/04/13 0708  AST 282* 315*  ALT 384* 367*  ALKPHOS 697* 716*  BILITOT 12.4* 12.6*  PROT 5.6* 5.6*  ALBUMIN 2.4* 2.3*     Recent Labs  07/02/13 1145 07/03/13 0533 07/04/13 0708  WBC 12.3* 10.5 10.5  NEUTROABS 8.8*  --   --   HGB 13.4 10.9* 10.7*  HCT 36.8 30.8* 30.0*  MCV 78.5 77.6* 78.1  PLT 294 251 233    Lab Results  Component Value Date   INR 0.94 07/02/2013    No results found for this basename: CKTOTAL, CKMB, CKMBINDEX, TROPONINI,  in the last 72 hours   Recent Labs  07/02/13 1830  TSH 0.160*    No results found for this basename: VITAMINB12, FOLATE, FERRITIN, TIBC, IRON, RETICCTPCT,  in the last 72 hours  Micro Results: No results found for this or any previous visit (from the past 240 hour(s)).   Studies/Results: Dg C-arm 61-120 Min-no Report  07/04/2013   CLINICAL DATA: pancreatic mass   C-ARM 61-120 MINUTES  Fluoroscopy was utilized by the requesting physician.  No radiographic  interpretation.      Medications: Scheduled: . cholecalciferol  1,000 Units Oral Daily  . docusate sodium  100 mg Oral BID  . lisinopril  40 mg Oral BID  . multivitamin with minerals  1 tablet Oral Daily  . pantoprazole  40 mg Oral Daily  . verapamil  120 mg Oral Daily   Continuous: . sodium chloride Stopped (07/04/13 0733)  . lactated ringers       Assessment/Plan: Principal Problem:   Obstructive jaundice Active Problems:   Essential hypertension   Hyperlipidemia   Pancreatic mass   Abdominal pain   Pruritus   Acute pancreatitis   Protein-calorie malnutrition, severe   Obstructive Jaundice c   Ab pain and admitted c bilirubin of 13.9, lipase 754, AST 345, ALT 533. CT of abdomen and pelvis revealed a 4 cm mass in the head of the pancreas and uncinate process concerning for pancreatic cancer.  CA19-9 is only 2.4 - this is reassuring.  S/p multiple failed attempts yesterday for ERCP/stenting - aborted due to pancreas obstruction was too sig.  Re-attempt today c possible EUS Bx.  If this fails what are are options - Surgery? Tertiary care center? Percutaneous Drainage? CT from 2011 did not show any tumor.  LFTs improving slowly c hydration as AST was 315 and ALT was 367 yesterday c T Bili 12.6.  Anticipate better improvement after stent is in.  Todays labs are Pending.  Pancreatitis c Lipase still 723 but not in pain.  Recurrent pancreatitis (4/08 and 9/11).  ?enzyme leak with the obstruction.  Continue IVF.  After stenting this # should decrease.  Pruritus- secondary to jaundice. Treat with Atarax and creams as needed.  HTN - BP currently OK to a little high.  HCT held. DBP low but not Sxatic.  GERD - PPI. Nexium at home   Non Toxic Multinodular goiter/Thyromegaly - Thyroid bx--Follicular Lesion  TSH 0.160 but OK- following    Hyperlipidemia- hold statin due to elevated liver function tests.  Constipation - prns.  Mild anemia - post hydration - Follow. 13.4 - 10.9 - 10.7  DVT Prophylaxis  Dispo remains the same - Treat today and get GI procedures.  Anticipate Dc home in 24-48 hrs when #s are better and she can eat.  SEVERE MALNUTRITION in the context of Acute Illness - See Nutrition notes.     LOS: 3 days   Carolyn [name] M 07/05/2013, 6:04 AM    

## 2013-07-05 NOTE — Interval H&P Note (Signed)
History and Physical Interval Note:  07/05/2013 10:13 AM  Carolyn [name]  has presented today for surgery, with the diagnosis of .pancreatic maqss/jaundice  The various methods of treatment have been discussed with the patient and family. After consideration of risks, benefits and other options for treatment, the patient has consented to  Procedure(s): ESOPHAGEAL ENDOSCOPIC ULTRASOUND (EUS) RADIAL (N/A) ENDOSCOPIC RETROGRADE CHOLANGIOPANCREATOGRAPHY (ERCP) (N/A) as a surgical intervention .  The patient's history has been reviewed, patient examined, no change in status, stable for surgery.  I have reviewed the patient's chart and labs.  Questions were answered to the patient's satisfaction.     Marlowe Lawes M  Assessment: 1.  Pancreatic mass with obstructive jaundice.  Failed ERCP attempt yesterday.  Plan: 1.  Endoscopic ultrasound with possible fine needle aspiration. 2.  Risks (bleeding, infection, bowel perforation that could require surgery, sedation-related changes in cardiopulmonary systems), benefits (identification and possible treatment of source of symptoms, exclusion of certain causes of symptoms), and alternatives (watchful waiting, radiographic imaging studies, empiric medical treatment) of upper endoscopy with ultrasound and possible fine needle aspiration (EUS +/- FNA) were explained to patient/family in detail and patient wishes to proceed. 3.  Endoscopic retrograde cholangiopancreatography with possible biliary stenting. 4.  Risks (up to and including bleeding, infection, perforation, pancreatitis that can be complicated by infected necrosis and death), benefits (removal of stones, alleviating blockage, decreasing risk of cholangitis or choledocholithiasis-related pancreatitis), and alternatives (watchful waiting, percutaneous transhepatic cholangiography) of ERCP were explained to patient/family in detail and patient elects to proceed. 5.  If ERCP is again unsuccessful, patient  will need Interventional Radiology consultation for consideration of percutaneous biliary decompression (PTC).

## 2013-07-05 NOTE — Addendum Note (Signed)
Addended by: Willis Modena on: 07/05/2013 01:05 PM   Modules accepted: Orders

## 2013-07-06 ENCOUNTER — Inpatient Hospital Stay (HOSPITAL_COMMUNITY): Payer: Medicare Other

## 2013-07-06 ENCOUNTER — Encounter (HOSPITAL_COMMUNITY): Payer: Self-pay | Admitting: Gastroenterology

## 2013-07-06 LAB — COMPREHENSIVE METABOLIC PANEL
ALT: 377 U/L — ABNORMAL HIGH (ref 0–35)
AST: 352 U/L — ABNORMAL HIGH (ref 0–37)
Alkaline Phosphatase: 761 U/L — ABNORMAL HIGH (ref 39–117)
BUN: 16 mg/dL (ref 6–23)
CO2: 23 mEq/L (ref 19–32)
Chloride: 105 mEq/L (ref 96–112)
GFR calc non Af Amer: 60 mL/min — ABNORMAL LOW (ref >90)
Glucose, Bld: 122 mg/dL — ABNORMAL HIGH (ref 70–99)
Potassium: 3.9 mEq/L (ref 3.5–5.1)
Sodium: 137 mEq/L (ref 135–145)
Total Bilirubin: 14.5 mg/dL — ABNORMAL HIGH (ref 0.3–1.2)
Total Protein: 5.2 g/dL — ABNORMAL LOW (ref 6.0–8.3)

## 2013-07-06 LAB — LIPASE, BLOOD: Lipase: 81 U/L — ABNORMAL HIGH (ref 11–59)

## 2013-07-06 LAB — CBC
Hemoglobin: 10.3 g/dL — ABNORMAL LOW (ref 12.0–15.0)
MCH: 27.8 pg (ref 26.0–34.0)
MCHC: 34.8 g/dL (ref 30.0–36.0)
RDW: 17.4 % — ABNORMAL HIGH (ref 11.5–15.5)
WBC: 10.6 10*3/uL — ABNORMAL HIGH (ref 4.0–10.5)

## 2013-07-06 MED ORDER — FENTANYL CITRATE 0.05 MG/ML IJ SOLN
INTRAMUSCULAR | Status: AC
  Filled 2013-07-06: qty 4

## 2013-07-06 MED ORDER — IOHEXOL 300 MG/ML  SOLN
50.0000 mL | Freq: Once | INTRAMUSCULAR | Status: AC | PRN
  Administered 2013-07-06: 10 mL via INTRAVENOUS

## 2013-07-06 MED ORDER — FENTANYL CITRATE 0.05 MG/ML IJ SOLN
INTRAMUSCULAR | Status: AC | PRN
  Administered 2013-07-06: 100 ug via INTRAVENOUS
  Administered 2013-07-06 (×2): 50 ug via INTRAVENOUS

## 2013-07-06 MED ORDER — PIPERACILLIN-TAZOBACTAM 3.375 G IVPB
3.3750 g | Freq: Once | INTRAVENOUS | Status: AC
  Administered 2013-07-06: 3.375 g via INTRAVENOUS
  Filled 2013-07-06: qty 50

## 2013-07-06 MED ORDER — MIDAZOLAM HCL 2 MG/2ML IJ SOLN
INTRAMUSCULAR | Status: AC
  Filled 2013-07-06: qty 6

## 2013-07-06 MED ORDER — MIDAZOLAM HCL 2 MG/2ML IJ SOLN
INTRAMUSCULAR | Status: AC | PRN
  Administered 2013-07-06: 2 mg via INTRAVENOUS
  Administered 2013-07-06 (×2): 1 mg via INTRAVENOUS

## 2013-07-06 NOTE — ED Notes (Signed)
Patient denies pain and is resting comfortably.  

## 2013-07-06 NOTE — Procedures (Signed)
L 10 Fr int/ext biliary drain No comp

## 2013-07-06 NOTE — Progress Notes (Signed)
Subjective:  F/Up Pancreatic Mass c Obstructive Jaundice ERCP stent fail x 2  Endo [place] bx performed 10/1  Less pain. Some but less itch - creams helping.  Some frustration  Objective: Vital signs in last 24 hours: Temp:  [97.9 F (36.6 C)-98.6 F (37 C)] 98.1 F (36.7 C) (10/02 0603) Pulse Rate:  [70-82] 75 (10/02 0603) Resp:  [14-20] 18 (10/02 0603) BP: (139-194)/(41-68) 154/53 mmHg (10/02 0603) SpO2:  [94 %-100 %] 97 % (10/02 0603) Weight change:  Last BM Date: 07/01/13  CBG (last 3)  No results found for this basename: GLUCAP,  in the last 72 hours  Intake/Output from previous day:  Intake/Output Summary (Last 24 hours) at 07/06/13 0710 Last data filed at 07/06/13 0600  Gross per 24 hour  Intake   3117 ml  Output      0 ml  Net   3117 ml   10/01 0701 - 10/02 0700 In: 3117 [I.V.:3117] Out: -    Physical Exam General appearance: alert and less icteric  Eyes: Less Sclera icterus  Resp: clear to auscultation bilaterally  Cardio: regular rate and rhythm and Grade 2/6 systolic ejection murmur  GI: soft, non-tender; bowel sounds normal; no masses, no organomegaly  Extremities: extremities normal, atraumatic, no cyanosis or edema  Neurologic: Alert and oriented X 3, normal strength and tone. Normal symmetric reflexes.  Skin: less itching noted   Lab Results:  Recent Labs  07/05/13 0529 07/06/13 0601  NA 139 137  K 3.9 3.9  CL 104 105  CO2 24 23  GLUCOSE 105* 122*  BUN 18 16  CREATININE 0.98 0.91  CALCIUM 8.8 8.7     Recent Labs  07/05/13 0529 07/06/13 0601  AST 344* 352*  ALT 370* 377*  ALKPHOS 737* 761*  BILITOT 13.8* 14.5*  PROT 5.3* 5.2*  ALBUMIN 2.2* 2.1*     Recent Labs  07/05/13 0529 07/06/13 0601  WBC 10.3 10.6*  NEUTROABS 6.8  --   HGB 10.1* 10.3*  HCT 29.6* 29.6*  MCV 79.8 79.8  PLT 228 238    Lab Results  Component Value Date   INR 0.94 07/02/2013    No results found for this basename: CKTOTAL, CKMB, CKMBINDEX,  TROPONINI,  in the last 72 hours  No results found for this basename: TSH, T4TOTAL, FREET3, T3FREE, THYROIDAB,  in the last 72 hours  No results found for this basename: VITAMINB12, FOLATE, FERRITIN, TIBC, IRON, RETICCTPCT,  in the last 72 hours  Micro Results: No results found for this or any previous visit (from the past 240 hour(s)).   Studies/Results: Dg C-arm 1-60 Min-no Report  07/05/2013   CLINICAL DATA: Bile duct obstrustion, pancreatic ca   C-ARM 1-60 MINUTES  Fluoroscopy was utilized by the requesting physician.  No radiographic  interpretation.    Dg C-arm 61-120 Min-no Report  07/04/2013   CLINICAL DATA: pancreatic mass   C-ARM 61-120 MINUTES  Fluoroscopy was utilized by the requesting physician.  No radiographic  interpretation.      Medications: Scheduled: . cholecalciferol  1,000 Units Oral Daily  . docusate sodium  100 mg Oral BID  . lisinopril  40 mg Oral BID  . multivitamin with minerals  1 tablet Oral Daily  . pantoprazole  40 mg Oral Daily  . verapamil  120 mg Oral Daily   Continuous: . sodium chloride 75 mL/hr at 07/06/13 0050  . lactated ringers       Assessment/Plan: Principal Problem:   Obstructive jaundice Active  Problems:   Essential hypertension   Hyperlipidemia   Pancreatic mass   Abdominal pain   Pruritus   Acute pancreatitis   Protein-calorie malnutrition, severe   Obstructive Jaundice c Ab pain and admitted c bilirubin of 13.9, lipase 754, AST 345, ALT 533. CT of abdomen and pelvis revealed a 4 cm mass in the head of the pancreas and uncinate process concerning for pancreatic cancer.  CA19-9 is only 2.4 - this is reassuring.  S/p multiple failed attempts 9/30 for ERCP/stenting - aborted due to pancreas obstruction was too sig.  Re-attempt 10/1 unsuccessfully but EUS Bx x 3 done worrisome for adenoCA.  For IR Percutaneous Drainage today.  Carolyn [name] asked me to call daughter Carolyn [name] and update her - # given was [phone].  First attempt was  wrong #.  No info provided.  Need to get correct # or try again.  LFTs now not budging as Alk Phos 737, AST 344 and ALT 370 yesterday c T Bili 13.8.  Anticipate better improvement after drained.  Todays labs are Alk Phos 761, AST 352 and ALT 377 c T Bili 14.5.  Pancreatitis but Lipase dropped from 723 to 81. Recurrent pancreatitis (4/08 and 9/11).    Pruritus- secondary to jaundice. Treat with Atarax and creams as needed/ordered.  HTN - BP currently OK to a high.  HCT held.   GERD - PPI. Nexium at home   Non Toxic Multinodular goiter/Thyromegaly - Thyroid bx--Follicular Lesion  TSH 0.160 but OK- following   Hyperlipidemia- hold statin due to elevated liver function tests.  Constipation - prns.  Mild anemia - post hydration - Follow. 13.4 - 10.9 - 10.7 - 10.1 - 10.3 = Stable.  DVT Prophylaxis  Dispo remains the same - Treat today and get IR procedures.  Anticipate Dc home in 24-48 hrs when #s are better and she can eat.  If adenocarcinoma - will need to see Onc and ? Gen Surgery but her prognosis would be terrible and hospice would need to be discussed.  SEVERE MALNUTRITION in the context of Acute Illness - See Nutrition notes.     LOS: 4 days   Carolyn [name] M 07/06/2013, 7:10 AM

## 2013-07-06 NOTE — ED Notes (Signed)
Family updated as to patient's status per MD 

## 2013-07-06 NOTE — Progress Notes (Addendum)
Patient ID: Carolyn [name], female   DOB: [birth year], 77 y.o.   MRN: [id number]   IR aware of possible biliary stent placement.  Pt with Panc mass and obstructive jaundice ERCP stent fail x 2 Endo [place] bx performed 10/1  Will ask IR Rad to review films and appropriateness of stent placement.  We will need official request in Epic if need [place] to move forward.

## 2013-07-06 NOTE — Progress Notes (Signed)
Patient remains comfortable following yesterday's EUS with FNA, and attempted ERCP. No evident complications.  Afebrile, although white count slightly up.  Final pathology report pending from yesterday's procedure, but I advised the patient that the preliminary impression was adenocarcinoma.  I have discussed her case with Dr. Almond Lint of surgery. Her recommendation was to proceed with percutaneous drainage of the bile duct, since her bilirubin is greater than 8. This is in the process of being arranged. I have spoken with interventional radiology and put in an order.  Meanwhile, she has recommended a CT of the chest, which I have ordered for tomorrow.  The current plan would be for the patient to go home in the next couple of days, with outpatient followup to be arranged with Dr. Donell Beers, at which time the pros and cons of surgical management could be discussed.  Florencia Reasons, M.D. [phone]

## 2013-07-06 NOTE — Consult Note (Signed)
HPI: Carolyn [name] is an 77 y.o. female found to have obstructive jaundice from pancreatic mass. She has undergone ERCP twice with unsuccessful attempts to get into CBD. Bili continues to trend up. IR is requested to place PTC drain. PMHx and meds reviewed. Pt actually feels pretty good this am. Denies abd pain, N/V, CP, SOB, fevers.   Past Medical History:  Past Medical History  Diagnosis Date  . Hypertension   . Hyperlipidemia   . GERD (gastroesophageal reflux disease)   . Asthma   . Obstructive jaundice 06/2013  . Pancreatitis     Past Surgical History:  Past Surgical History  Procedure Laterality Date  . Neck surgery  2005/2007  . Hand surgery  2010  . Cataract extraction    . Abdominal hysterectomy    . Eus N/A 07/05/2013    Procedure: ESOPHAGEAL ENDOSCOPIC ULTRASOUND (EUS) RADIAL;  Surgeon: Willis Modena, MD;  Location: [facility];  Service: Endoscopy;  Laterality: N/A;  . Ercp N/A 07/05/2013    Procedure: ENDOSCOPIC RETROGRADE CHOLANGIOPANCREATOGRAPHY (ERCP);  Surgeon: Willis Modena, MD;  Location: [facility];  Service: Endoscopy;  Laterality: N/A;    Family History: History reviewed. No pertinent family history.  Social History:  reports that she has been smoking.  She does not have any smokeless tobacco history on file. She reports that she drinks about 0.6 ounces of alcohol per week. She reports that she does not use illicit drugs.  Allergies:  Allergies  Allergen Reactions  . Other Other (See Comments)    Alka seltzer caused severe swelling    Medications:   Medication List    ASK your doctor about these medications       cholecalciferol 1000 UNITS tablet  Commonly known as:  VITAMIN D  Take 1,000 Units by mouth daily.     diphenhydrAMINE 25 mg capsule  Commonly known as:  BENADRYL  Take 25 mg by mouth daily as needed for itching.     esomeprazole 40 MG capsule  Commonly known as:  NEXIUM  Take 40 mg by mouth daily before breakfast.     ezetimibe-simvastatin 10-40 MG per tablet  Commonly known as:  VYTORIN  Take 1 tablet by mouth daily.     hydrochlorothiazide 25 MG tablet  Commonly known as:  HYDRODIURIL  Take 25 mg by mouth daily.     lisinopril 40 MG tablet  Commonly known as:  PRINIVIL,ZESTRIL  Take 40 mg by mouth 2 (two) times daily.     naproxen sodium 220 MG tablet  Commonly known as:  ANAPROX  Take 440 mg by mouth daily as needed (pain).     verapamil 120 MG CR tablet  Commonly known as:  CALAN-SR  Take 120 mg by mouth daily.        Please HPI for pertinent positives, otherwise complete 10 system ROS negative.  Physical Exam: BP 154/53  Pulse 75  Temp(Src) 98.1 F (36.7 C) (Oral)  Resp 18  Ht 5\' 3"  (1.6 m)  Wt 187 lb (84.823 kg)  BMI 33.13 kg/m2  SpO2 97% Body mass index is 33.13 kg/(m^2).   General Appearance:  Alert, cooperative, no distress, appears stated age  Head:  Normocephalic, without obvious abnormality, atraumatic  ENT: Scleral icterus  Neck: Supple, symmetrical, trachea midline  Lungs:   Clear to auscultation bilaterally, no w/r/r  Chest Wall:  No tenderness or deformity  Heart:  Regular rate and rhythm, S1, S2 normal, no murmur, rub or gallop.  Abdomen:   Soft,  non-tender, non distended.  Extremities: Extremities normal, atraumatic, no cyanosis or edema  Pulses: 2+ and symmetric  Neurologic: Normal affect, no gross deficits.   Results for orders placed during the hospital encounter of 07/02/13 (from the past 48 hour(s))  COMPREHENSIVE METABOLIC PANEL     Status: Abnormal   Collection Time    07/05/13  5:29 AM      Result Value Range   Sodium 139  135 - 145 mEq/L   Potassium 3.9  3.5 - 5.1 mEq/L   Chloride 104  96 - 112 mEq/L   CO2 24  19 - 32 mEq/L   Glucose, Bld 105 (*) 70 - 99 mg/dL   BUN 18  6 - 23 mg/dL   Creatinine, Ser 4.54  0.50 - 1.10 mg/dL   Comment: ICTERUS AT THIS LEVEL MAY AFFECT RESULT   Calcium 8.8  8.4 - 10.5 mg/dL   Total Protein 5.3 (*) 6.0 - 8.3  g/dL   Albumin 2.2 (*) 3.5 - 5.2 g/dL   AST 098 (*) 0 - 37 U/L   ALT 370 (*) 0 - 35 U/L   Alkaline Phosphatase 737 (*) 39 - 117 U/L   Total Bilirubin 13.8 (*) 0.3 - 1.2 mg/dL   GFR calc non Af Amer 55 (*) >90 mL/min   GFR calc Af Amer 64 (*) >90 mL/min   Comment: (NOTE)     The eGFR has been calculated using the CKD EPI equation.     This calculation has not been validated in all clinical situations.     eGFR's persistently <90 mL/min signify possible Chronic Kidney     Disease.  CBC WITH DIFFERENTIAL     Status: Abnormal   Collection Time    07/05/13  5:29 AM      Result Value Range   WBC 10.3  4.0 - 10.5 K/uL   Comment: WHITE COUNT CONFIRMED ON SMEAR   RBC 3.71 (*) 3.87 - 5.11 MIL/uL   Hemoglobin 10.1 (*) 12.0 - 15.0 g/dL   HCT 11.9 (*) 14.7 - 82.9 %   MCV 79.8  78.0 - 100.0 fL   MCH 27.2  26.0 - 34.0 pg   MCHC 34.1  30.0 - 36.0 g/dL   RDW 56.2 (*) 13.0 - 86.5 %   Platelets 228  150 - 400 K/uL   Neutrophils Relative % 66  43 - 77 %   Lymphocytes Relative 21  12 - 46 %   Monocytes Relative 8  3 - 12 %   Eosinophils Relative 4  0 - 5 %   Basophils Relative 1  0 - 1 %   RBC Morphology TARGET CELLS     Comment: RARE NRBCs   Neutro Abs 6.8  1.7 - 7.7 K/uL   Lymphs Abs 2.2  0.7 - 4.0 K/uL   Monocytes Absolute 0.8  0.1 - 1.0 K/uL   Eosinophils Absolute 0.4  0.0 - 0.7 K/uL   Basophils Absolute 0.1  0.0 - 0.1 K/uL  COMPREHENSIVE METABOLIC PANEL     Status: Abnormal   Collection Time    07/06/13  6:01 AM      Result Value Range   Sodium 137  135 - 145 mEq/L   Potassium 3.9  3.5 - 5.1 mEq/L   Chloride 105  96 - 112 mEq/L   CO2 23  19 - 32 mEq/L   Glucose, Bld 122 (*) 70 - 99 mg/dL   BUN 16  6 - 23 mg/dL  Creatinine, Ser 0.91  0.50 - 1.10 mg/dL   Comment: ICTERUS AT THIS LEVEL MAY AFFECT RESULT   Calcium 8.7  8.4 - 10.5 mg/dL   Total Protein 5.2 (*) 6.0 - 8.3 g/dL   Albumin 2.1 (*) 3.5 - 5.2 g/dL   AST 409 (*) 0 - 37 U/L   ALT 377 (*) 0 - 35 U/L   Alkaline Phosphatase  761 (*) 39 - 117 U/L   Total Bilirubin 14.5 (*) 0.3 - 1.2 mg/dL   GFR calc non Af Amer 60 (*) >90 mL/min   GFR calc Af Amer 70 (*) >90 mL/min   Comment: (NOTE)     The eGFR has been calculated using the CKD EPI equation.     This calculation has not been validated in all clinical situations.     eGFR's persistently <90 mL/min signify possible Chronic Kidney     Disease.  CBC     Status: Abnormal   Collection Time    07/06/13  6:01 AM      Result Value Range   WBC 10.6 (*) 4.0 - 10.5 K/uL   RBC 3.71 (*) 3.87 - 5.11 MIL/uL   Hemoglobin 10.3 (*) 12.0 - 15.0 g/dL   HCT 81.1 (*) 91.4 - 78.2 %   MCV 79.8  78.0 - 100.0 fL   MCH 27.8  26.0 - 34.0 pg   MCHC 34.8  30.0 - 36.0 g/dL   RDW 95.6 (*) 21.3 - 08.6 %   Platelets 238  150 - 400 K/uL  LIPASE, BLOOD     Status: Abnormal   Collection Time    07/06/13  6:01 AM      Result Value Range   Lipase 81 (*) 11 - 59 U/L   Dg C-arm 1-60 Min-no Report  07/05/2013   CLINICAL DATA: Bile duct obstrustion, pancreatic ca   C-ARM 1-60 MINUTES  Fluoroscopy was utilized by the requesting physician.  No radiographic  interpretation.    Dg C-arm 61-120 Min-no Report  07/04/2013   CLINICAL DATA: pancreatic mass   C-ARM 61-120 MINUTES  Fluoroscopy was utilized by the requesting physician.  No radiographic  interpretation.     Assessment/Plan Pancreatic mass with obstructive jaundice Discussed placement of PTC drain, possibly int/ext but more than likely external only. Explained procedure in detail, risks, complications, use of sedation. Labs reviewed. Consent signed in chart  Brayton El PA-C 07/06/2013, 9:21 AM

## 2013-07-06 NOTE — Anesthesia Postprocedure Evaluation (Signed)
  Anesthesia Post-op Note  Patient: Carolyn [name]  Procedure(s) Performed: Procedure(s) (LRB): ESOPHAGEAL ENDOSCOPIC ULTRASOUND (EUS) RADIAL (N/A) ENDOSCOPIC RETROGRADE CHOLANGIOPANCREATOGRAPHY (ERCP) (N/A)  Patient Location: PACU  Anesthesia Type: General  Level of Consciousness: awake and alert   Airway and Oxygen Therapy: Patient Spontanous Breathing  Post-op Pain: mild  Post-op Assessment: Post-op Vital signs reviewed, Patient's Cardiovascular Status Stable, Respiratory Function Stable, Patent Airway and No signs of Nausea or vomiting  Last Vitals:  Filed Vitals:   07/06/13 0603  BP: 154/53  Pulse: 75  Temp: 36.7 C  Resp: 18    Post-op Vital Signs: stable   Complications: No apparent anesthesia complications

## 2013-07-07 ENCOUNTER — Encounter (HOSPITAL_COMMUNITY): Payer: Self-pay | Admitting: Radiology

## 2013-07-07 ENCOUNTER — Inpatient Hospital Stay (HOSPITAL_COMMUNITY): Payer: Medicare Other

## 2013-07-07 DIAGNOSIS — K869 Disease of pancreas, unspecified: Secondary | ICD-10-CM

## 2013-07-07 LAB — CBC
HCT: 32.9 % — ABNORMAL LOW (ref 36.0–46.0)
MCH: 27.1 pg (ref 26.0–34.0)
MCHC: 32.5 g/dL (ref 30.0–36.0)
Platelets: 254 10*3/uL (ref 150–400)
RBC: 3.95 MIL/uL (ref 3.87–5.11)

## 2013-07-07 LAB — COMPREHENSIVE METABOLIC PANEL
AST: 193 U/L — ABNORMAL HIGH (ref 0–37)
BUN: 18 mg/dL (ref 6–23)
CO2: 23 mEq/L (ref 19–32)
Chloride: 104 mEq/L (ref 96–112)
Creatinine, Ser: 0.98 mg/dL (ref 0.50–1.10)
GFR calc Af Amer: 64 mL/min — ABNORMAL LOW (ref >90)
GFR calc non Af Amer: 55 mL/min — ABNORMAL LOW (ref >90)
Glucose, Bld: 98 mg/dL (ref 70–99)
Total Bilirubin: 7 mg/dL — ABNORMAL HIGH (ref 0.3–1.2)
Total Protein: 6.5 g/dL (ref 6.0–8.3)

## 2013-07-07 MED ORDER — IOHEXOL 300 MG/ML  SOLN
80.0000 mL | Freq: Once | INTRAMUSCULAR | Status: AC | PRN
  Administered 2013-07-07: 80 mL via INTRAVENOUS

## 2013-07-07 MED ORDER — HYDROCODONE-ACETAMINOPHEN 5-325 MG PO TABS: 1.0000 | ORAL_TABLET | Freq: Four times a day (QID) | ORAL | Status: DC | PRN

## 2013-07-07 MED ORDER — HYDROCODONE-ACETAMINOPHEN 5-325 MG PO TABS
1.0000 | ORAL_TABLET | Freq: Four times a day (QID) | ORAL | Status: DC | PRN
  Administered 2013-07-07 – 2013-07-08 (×4): 1 via ORAL
  Filled 2013-07-07 (×4): qty 1

## 2013-07-07 MED ORDER — HYDROXYZINE HCL 25 MG PO TABS
25.0000 mg | ORAL_TABLET | Freq: Three times a day (TID) | ORAL | Status: DC | PRN
Start: 2013-07-07 — End: 2013-07-18

## 2013-07-07 MED ORDER — ENOXAPARIN SODIUM 40 MG/0.4ML ~~LOC~~ SOLN
40.0000 mg | SUBCUTANEOUS | Status: DC
  Administered 2013-07-07: 40 mg via SUBCUTANEOUS
  Filled 2013-07-07 (×2): qty 0.4

## 2013-07-07 MED ORDER — NONFORMULARY OR COMPOUNDED ITEM: 1.0000 "application " | Freq: Four times a day (QID) | Status: DC | PRN

## 2013-07-07 MED ORDER — BISACODYL 10 MG RE SUPP
10.0000 mg | Freq: Every day | RECTAL | Status: DC | PRN
  Administered 2013-07-07: 10 mg via RECTAL
  Filled 2013-07-07 (×2): qty 1

## 2013-07-07 MED ORDER — ENSURE COMPLETE PO LIQD: 237.0000 mL | Freq: Two times a day (BID) | ORAL | Status: DC | PRN

## 2013-07-07 MED ORDER — POLYETHYLENE GLYCOL 3350 17 G PO PACK: 17.0000 g | PACK | Freq: Every day | ORAL | Status: DC | PRN

## 2013-07-07 MED ORDER — DSS 100 MG PO CAPS: 100.0000 mg | ORAL_CAPSULE | Freq: Two times a day (BID) | ORAL | Status: DC

## 2013-07-07 MED ORDER — ADULT MULTIVITAMIN W/MINERALS CH: 1.0000 | ORAL_TABLET | Freq: Every day | ORAL | Status: DC

## 2013-07-07 MED ORDER — FUROSEMIDE 20 MG PO TABS
20.0000 mg | ORAL_TABLET | Freq: Once | ORAL | Status: AC
  Administered 2013-07-07: 20 mg via ORAL
  Filled 2013-07-07: qty 1

## 2013-07-07 NOTE — Progress Notes (Signed)
2 Days Post-Op  Subjective: Biliary drain placed 10/2 Pt feels so much better today Draining well EUS bx panc mass 10/1 c/w adenocarcinoma   Objective: Vital signs in last 24 hours: Temp:  [98 F (36.7 C)-99.1 F (37.3 C)] 98.3 F (36.8 C) (10/03 1000) Pulse Rate:  [57-84] 84 (10/03 1000) Resp:  [13-20] 18 (10/03 1000) BP: (145-180)/(38-77) 164/77 mmHg (10/03 1000) SpO2:  [92 %-100 %] 95 % (10/03 1000) Last BM Date: 07/01/13  Intake/Output from previous day: 10/02 0701 - 10/03 0700 In: 1393 [I.V.:1393] Out: 875 [Drains:875] Intake/Output this shift: Total I/O In: -  Out: 260 [Drains:260]  PE:  Afeb; vss Site of Bili drain intact; NT Clean and dry Output 875 cc yesterday; 260 today ( 150 cc in bag) Output bilious LFTs decreased T Bili: 7 (14.5) Wbc 14 (10.6)   Lab Results:   Recent Labs  07/06/13 0601 07/07/13 0715  WBC 10.6* 14.0*  HGB 10.3* 10.7*  HCT 29.6* 32.9*  PLT 238 254   BMET  Recent Labs  07/06/13 0601 07/07/13 0715  NA 137 137  K 3.9 4.0  CL 105 104  CO2 23 23  GLUCOSE 122* 98  BUN 16 18  CREATININE 0.91 0.98  CALCIUM 8.7 9.3   PT/INR No results found for this basename: LABPROT, INR,  in the last 72 hours ABG No results found for this basename: PHART, PCO2, PO2, HCO3,  in the last 72 hours  Studies/Results: Ct Chest W Contrast  07/07/2013   CLINICAL DATA:  Weakness, shortness of breath. Recently diagnosed pancreatic mass.  EXAM: CT CHEST WITH CONTRAST  TECHNIQUE: Multidetector CT imaging of the chest was performed during intravenous contrast administration.  CONTRAST:  80mL OMNIPAQUE IOHEXOL 300 MG/ML  SOLN  COMPARISON:  Chest radiograph dated 07/02/2013  FINDINGS: Trace right pleural effusion with associated mild dependent atelectasis at the right lung base.  Minimal subpleural nodularity in the anterior right upper lobe measuring up to 4 mm (series 3/ images 18 and 22). These findings are not worrisome for metastatic disease. Mild  centrilobular emphysematous changes. No pneumothorax.  Thyroid is enlarged/nodular, better evaluated on prior thyroid ultrasound.  The heart is top-normal in size. No pericardial effusion. Coronary atherosclerosis. Atherosclerotic calcifications of the aortic arch. Ectasia of the descending thoracic aorta measuring up to 3.2 cm (series 2/ image 44).  No suspicious mediastinal, hilar, or axillary lymphadenopathy.  Visualized upper abdomen is notable for a percutaneous biliary drain but is otherwise unchanged from recent CT.  Degenerative changes of the visualized thoracolumbar spine.  IMPRESSION: No findings suspicious for metastatic disease in the chest.  Minimal subpleural nodularity in the anterior right upper lobe measuring up to 4 mm, likely benign. Given high risk for primary bronchogenic neoplasm, a single follow-up CT chest is suggested in 12 months.  Trace right pleural effusion with associated mild dependent atelectasis.  This recommendation follows the consensus statement: Guidelines for Management of Small Pulmonary Nodules Detected on CT Scans: A Statement from the Fleischner Society as published in Radiology 2005; 237:395-400.   Electronically Signed   By: Charline Bills M.D.   On: 07/07/2013 10:14   Ir Biliary Drain Catheter Placement  07/06/2013   CLINICAL DATA:  Pancreatic head mass  EXAM: PERCUTANEOUS TRANSHEPATIC CHOLANGIOGRAM; IR ULTRASOUND GUIDANCE TISSUE ABLATION; NEPHROSTOMY REMOVAL  MEDICATIONS AND MEDICAL HISTORY: Versed 4.0 mg, Fentanyl 2 1 mcg.  Additional Medications: Zosyn.  ANESTHESIA/SEDATION: Moderate sedation time: 20 minutes  CONTRAST:  50 cc Omnipaque 300  FLUOROSCOPY TIME:  5 min and 56 seconds.  PROCEDURE: The procedure, risks, benefits, and alternatives were explained to the patient. Questions regarding the procedure were encouraged and answered. The patient understands and consents to the procedure.  The epigastrium was prepped with Betadine in a sterile fashion, and a  sterile drape was applied covering the operative field. A sterile gown and sterile gloves were used for the procedure.  Under sonographic guidance, a 22 gauge Chiba needle was inserted into a peripheral biliary duct within the lateral segment of the left lobe. After aspirating bile, contrast was injected opacifying the biliary tree. The needle was removed over a 018 wire which was upsized to the 3 J included in the Accustick set. A copy catheter was advanced over the J-wire was exchanged for a Glidewire. The Kumpe be was advanced over the glidewire into the duodenum across the common bile duct obstruction. A copy was exchanged for a 10 dilator then a 10 [place] biliary drain. This was looped in the duodenum, string fixed common and sewn to the skin. Contrast was injected.  COMPLICATIONS: None  FINDINGS: Imaging documents needle access into the biliary tree within the lateral segment of the left lobe of the liver. Subsequent images demonstrate placement of a left internal external biliary drain with its tip coiled in the duodenum.  IMPRESSION: Successful left 10 French internal external biliary drainage. This will be capped after 24-48 hr. She will follow-up with general surgery regarding consultation of resection.   Electronically Signed   By: Maryclare Bean M.D.   On: 07/06/2013 14:15   Ir Ptc  07/06/2013   CLINICAL DATA:  Pancreatic head mass  EXAM: PERCUTANEOUS TRANSHEPATIC CHOLANGIOGRAM; IR ULTRASOUND GUIDANCE TISSUE ABLATION; NEPHROSTOMY REMOVAL  MEDICATIONS AND MEDICAL HISTORY: Versed 4.0 mg, Fentanyl 2 1 mcg.  Additional Medications: Zosyn.  ANESTHESIA/SEDATION: Moderate sedation time: 20 minutes  CONTRAST:  50 cc Omnipaque 300  FLUOROSCOPY TIME:  5 min and 56 seconds.  PROCEDURE: The procedure, risks, benefits, and alternatives were explained to the patient. Questions regarding the procedure were encouraged and answered. The patient understands and consents to the procedure.  The epigastrium was prepped with  Betadine in a sterile fashion, and a sterile drape was applied covering the operative field. A sterile gown and sterile gloves were used for the procedure.  Under sonographic guidance, a 22 gauge Chiba needle was inserted into a peripheral biliary duct within the lateral segment of the left lobe. After aspirating bile, contrast was injected opacifying the biliary tree. The needle was removed over a 018 wire which was upsized to the 3 J included in the Accustick set. A copy catheter was advanced over the J-wire was exchanged for a Glidewire. The Kumpe be was advanced over the glidewire into the duodenum across the common bile duct obstruction. A copy was exchanged for a 10 dilator then a 10 [place] biliary drain. This was looped in the duodenum, string fixed common and sewn to the skin. Contrast was injected.  COMPLICATIONS: None  FINDINGS: Imaging documents needle access into the biliary tree within the lateral segment of the left lobe of the liver. Subsequent images demonstrate placement of a left internal external biliary drain with its tip coiled in the duodenum.  IMPRESSION: Successful left 10 French internal external biliary drainage. This will be capped after 24-48 hr. She will follow-up with general surgery regarding consultation of resection.   Electronically Signed   By: Maryclare Bean M.D.   On: 07/06/2013 14:15   Ir US Guide Bx  Asp/drain  07/06/2013   CLINICAL DATA:  Pancreatic head mass  EXAM: PERCUTANEOUS TRANSHEPATIC CHOLANGIOGRAM; IR ULTRASOUND GUIDANCE TISSUE ABLATION; NEPHROSTOMY REMOVAL  MEDICATIONS AND MEDICAL HISTORY: Versed 4.0 mg, Fentanyl 2 1 mcg.  Additional Medications: Zosyn.  ANESTHESIA/SEDATION: Moderate sedation time: 20 minutes  CONTRAST:  50 cc Omnipaque 300  FLUOROSCOPY TIME:  5 min and 56 seconds.  PROCEDURE: The procedure, risks, benefits, and alternatives were explained to the patient. Questions regarding the procedure were encouraged and answered. The patient understands and consents  to the procedure.  The epigastrium was prepped with Betadine in a sterile fashion, and a sterile drape was applied covering the operative field. A sterile gown and sterile gloves were used for the procedure.  Under sonographic guidance, a 22 gauge Chiba needle was inserted into a peripheral biliary duct within the lateral segment of the left lobe. After aspirating bile, contrast was injected opacifying the biliary tree. The needle was removed over a 018 wire which was upsized to the 3 J included in the Accustick set. A copy catheter was advanced over the J-wire was exchanged for a Glidewire. The Kumpe be was advanced over the glidewire into the duodenum across the common bile duct obstruction. A copy was exchanged for a 10 dilator then a 10 [place] biliary drain. This was looped in the duodenum, string fixed common and sewn to the skin. Contrast was injected.  COMPLICATIONS: None  FINDINGS: Imaging documents needle access into the biliary tree within the lateral segment of the left lobe of the liver. Subsequent images demonstrate placement of a left internal external biliary drain with its tip coiled in the duodenum.  IMPRESSION: Successful left 10 French internal external biliary drainage. This will be capped after 24-48 hr. She will follow-up with general surgery regarding consultation of resection.   Electronically Signed   By: Maryclare Bean M.D.   On: 07/06/2013 14:15   Dg C-arm 1-60 Min-no Report  07/05/2013   CLINICAL DATA: Bile duct obstrustion, pancreatic ca   C-ARM 1-60 MINUTES  Fluoroscopy was utilized by the requesting physician.  No radiographic  interpretation.     Anti-infectives: Anti-infectives   Start     Dose/Rate Route Frequency Ordered Stop   07/06/13 1100  piperacillin-tazobactam (ZOSYN) IVPB 3.375 g    Comments:  Will be given in in/by IR   3.375 g 12.5 mL/hr over 240 Minutes Intravenous  Once 07/06/13 0927 07/06/13 1644   07/05/13 1000  ciprofloxacin (CIPRO) IVPB 400 mg     400  mg 200 mL/hr over 60 Minutes Intravenous  Once 07/05/13 0958 07/05/13 1101   07/04/13 0600  cefTRIAXone (ROCEPHIN) 1 g in dextrose 5 % 50 mL IVPB     1 g 100 mL/hr over 30 Minutes Intravenous  Once 07/03/13 1009 07/04/13 9604      Assessment/Plan: s/p Procedure(s): ESOPHAGEAL ENDOSCOPIC ULTRASOUND (EUS) RADIAL (N/A) ENDOSCOPIC RETROGRADE CHOLANGIOPANCREATOGRAPHY (ERCP) (N/A)  panc mass/ obstr jaundice Bili drain placed 10/2 Feels better- draining well Plan per Onc/CCS   LOS: 5 days    Jorden Minchey A 07/07/2013

## 2013-07-07 NOTE — Clinical Social Work Note (Signed)
Clinical Social Worker received inappropriate referral for home health needs. CSW will defer to CM. CSW is signing off, as social work intervention is not needed.   Rozetta Nunnery MSW, [employer] [phone]

## 2013-07-07 NOTE — Consult Note (Signed)
#  [id number] is the consult note.  I believe that the decision right now is whether or not she is a surgical candidate. She may not be at the present time because of the elevated bilirubin.  On the endoscopic ultrasound, I don't see anything there that were precluded her from surgery.  She may need neoadjuvant chemotherapy and radiation therapy.  Surgery, hopefully, will see her over the weekend. I  I don't think we need any further staging studies on her.  From what I can tell, I would think that she has stage I disease.  We will continue to pray about this.  Pete E.

## 2013-07-07 NOTE — Care Management Note (Signed)
  Page 1 of 1   07/07/2013     11:22:15 AM   CARE MANAGEMENT NOTE 07/07/2013  Patient:  Carolyn [name], Carolyn [name]   Account Number:  [id number]  Date Initiated:  07/07/2013  Documentation initiated by:  Ronny Flurry  Subjective/Objective Assessment:     Action/Plan:   Anticipated DC Date:  07/08/2013   Anticipated DC Plan:  HOME W HOME HEALTH SERVICES         Choice offered to / List presented to:  C-1 Patient        HH arranged  HH-1 RN      [facility] agency  [facility]   Status of service:  In process, will continue to follow Medicare Important Message given?   (If response is "NO", the following Medicare IM given date fields will be blank) Date Medicare IM given:   Date Additional Medicare IM given:    Discharge Disposition:    Per UR Regulation:    If discussed at Long Length of Stay Meetings, dates discussed:    Comments:  07-07-13 Confirmed face sheet information. Ronny Flurry RN BSN [phone]

## 2013-07-07 NOTE — Progress Notes (Addendum)
Patient ID: Carolyn [name], female   DOB: 1936-06-04, 77 y.o.   MRN: [id number] [facility] Progress Note  Carolyn [name] 77 y.o. Jul 07, 1936   Subjective: Just back from radiology for CT of chest. Feels weak. Denies abdominal pain, nausea, or vomiting. Tolerated solid foods for breakfast.  Objective: Vital signs: Filed Vitals:   07/07/13 0644  BP: 167/77  Pulse: 83  Temp: 98 F (36.7 C)  Resp: 18    Physical Exam: Gen: alert, no acute distress, elderly, obese  Abd: soft, nontender, nondistended, drains noted  Lab Results:  Recent Labs  07/06/13 0601 07/07/13 0715  NA 137 137  K 3.9 4.0  CL 105 104  CO2 23 23  GLUCOSE 122* 98  BUN 16 18  CREATININE 0.91 0.98  CALCIUM 8.7 9.3    Recent Labs  07/06/13 0601 07/07/13 0715  AST 352* 193*  ALT 377* 354*  ALKPHOS 761* 803*  BILITOT 14.5* 7.0*  PROT 5.2* 6.5  ALBUMIN 2.1* 2.6*    Recent Labs  07/05/13 0529 07/06/13 0601 07/07/13 0715  WBC 10.3 10.6* 14.0*  NEUTROABS 6.8  --   --   HGB 10.1* 10.3* 10.7*  HCT 29.6* 29.6* 32.9*  MCV 79.8 79.8 83.3  PLT 228 238 254      Assessment/Plan: Obtstructive jaundice due to pancreatic cancer - staging in progress. Agree with f/u with Dr. Donell Beers and also H/O for her pancreatic cancer to decide on surgery vs chemo vs both. Continue supportive care.    [name] C. 07/07/2013, 9:41 AM

## 2013-07-07 NOTE — Progress Notes (Addendum)
Subjective:  F/Up Pancreatic Mass c Obstructive Jaundice ERCP stent fail x 2  Endo [place] bx performed 10/1 S/P IR placed perc drain/L 10 Fr int/ext biliary drain. Less pain. Less itch Cytology of FNA c/w single and small clusters of Malignant glandular cells in the background of tumor Necrosis. C/W Adenocarcinoma. Feeling and looking lots better  Objective: Vital signs in last 24 hours: Temp:  [98 F (36.7 C)-99.1 F (37.3 C)] 98 F (36.7 C) (10/03 0644) Pulse Rate:  [57-83] 83 (10/03 0644) Resp:  [13-20] 18 (10/03 0644) BP: (145-180)/(38-77) 167/77 mmHg (10/03 0644) SpO2:  [92 %-100 %] 92 % (10/03 0644) Weight change:  Last BM Date: 07/01/13  CBG (last 3)  No results found for this basename: GLUCAP,  in the last 72 hours  Intake/Output from previous day:  Intake/Output Summary (Last 24 hours) at 07/07/13 0807 Last data filed at 07/07/13 0600  Gross per 24 hour  Intake   1393 ml  Output    875 ml  Net    518 ml   10/02 0701 - 10/03 0700 In: 1393 [I.V.:1393] Out: 875 [Drains:875]   Physical Exam General appearance: alert and less icteric  Eyes: Less Sclera icterus  Resp: clear to auscultation bilaterally  Cardio: regular rate and rhythm and Grade 2/6 systolic ejection murmur  GI: soft, non-tender; bowel sounds normal; no masses, no organomegaly. RUQ drain in place Extremities: extremities normal, atraumatic, no cyanosis or edema  Neurologic: Alert and oriented X 3, normal strength and tone. Normal symmetric reflexes.  Skin: less itching noted LE edema noted   Lab Results:  Recent Labs  07/06/13 0601 07/07/13 0715  NA 137 137  K 3.9 4.0  CL 105 104  CO2 23 23  GLUCOSE 122* 98  BUN 16 18  CREATININE 0.91 0.98  CALCIUM 8.7 9.3     Recent Labs  07/06/13 0601 07/07/13 0715  AST 352* 193*  ALT 377* 354*  ALKPHOS 761* 803*  BILITOT 14.5* 7.0*  PROT 5.2* 6.5  ALBUMIN 2.1* 2.6*     Recent Labs  07/05/13 0529 07/06/13 0601 07/07/13 0715  WBC  10.3 10.6* 14.0*  NEUTROABS 6.8  --   --   HGB 10.1* 10.3* 10.7*  HCT 29.6* 29.6* 32.9*  MCV 79.8 79.8 83.3  PLT 228 238 254    Lab Results  Component Value Date   INR 0.94 07/02/2013    No results found for this basename: CKTOTAL, CKMB, CKMBINDEX, TROPONINI,  in the last 72 hours  No results found for this basename: TSH, T4TOTAL, FREET3, T3FREE, THYROIDAB,  in the last 72 hours  No results found for this basename: VITAMINB12, FOLATE, FERRITIN, TIBC, IRON, RETICCTPCT,  in the last 72 hours  Micro Results: No results found for this or any previous visit (from the past 240 hour(s)).   Studies/Results: Ir Biliary Drain Catheter Placement  07/06/2013   CLINICAL DATA:  Pancreatic head mass  EXAM: PERCUTANEOUS TRANSHEPATIC CHOLANGIOGRAM; IR ULTRASOUND GUIDANCE TISSUE ABLATION; NEPHROSTOMY REMOVAL  MEDICATIONS AND MEDICAL HISTORY: Versed 4.0 mg, Fentanyl 2 1 mcg.  Additional Medications: Zosyn.  ANESTHESIA/SEDATION: Moderate sedation time: 20 minutes  CONTRAST:  50 cc Omnipaque 300  FLUOROSCOPY TIME:  5 min and 56 seconds.  PROCEDURE: The procedure, risks, benefits, and alternatives were explained to the patient. Questions regarding the procedure were encouraged and answered. The patient understands and consents to the procedure.  The epigastrium was prepped with Betadine in a sterile fashion, and a sterile drape was applied covering  the operative field. A sterile gown and sterile gloves were used for the procedure.  Under sonographic guidance, a 22 gauge Chiba needle was inserted into a peripheral biliary duct within the lateral segment of the left lobe. After aspirating bile, contrast was injected opacifying the biliary tree. The needle was removed over a 018 wire which was upsized to the 3 J included in the Accustick set. A copy catheter was advanced over the J-wire was exchanged for a Glidewire. The Kumpe be was advanced over the glidewire into the duodenum across the common bile duct  obstruction. A copy was exchanged for a 10 dilator then a 10 [place] biliary drain. This was looped in the duodenum, string fixed common and sewn to the skin. Contrast was injected.  COMPLICATIONS: None  FINDINGS: Imaging documents needle access into the biliary tree within the lateral segment of the left lobe of the liver. Subsequent images demonstrate placement of a left internal external biliary drain with its tip coiled in the duodenum.  IMPRESSION: Successful left 10 French internal external biliary drainage. This will be capped after 24-48 hr. She will follow-up with general surgery regarding consultation of resection.   Electronically Signed   By: Maryclare Bean M.D.   On: 07/06/2013 14:15   Ir Ptc  07/06/2013   CLINICAL DATA:  Pancreatic head mass  EXAM: PERCUTANEOUS TRANSHEPATIC CHOLANGIOGRAM; IR ULTRASOUND GUIDANCE TISSUE ABLATION; NEPHROSTOMY REMOVAL  MEDICATIONS AND MEDICAL HISTORY: Versed 4.0 mg, Fentanyl 2 1 mcg.  Additional Medications: Zosyn.  ANESTHESIA/SEDATION: Moderate sedation time: 20 minutes  CONTRAST:  50 cc Omnipaque 300  FLUOROSCOPY TIME:  5 min and 56 seconds.  PROCEDURE: The procedure, risks, benefits, and alternatives were explained to the patient. Questions regarding the procedure were encouraged and answered. The patient understands and consents to the procedure.  The epigastrium was prepped with Betadine in a sterile fashion, and a sterile drape was applied covering the operative field. A sterile gown and sterile gloves were used for the procedure.  Under sonographic guidance, a 22 gauge Chiba needle was inserted into a peripheral biliary duct within the lateral segment of the left lobe. After aspirating bile, contrast was injected opacifying the biliary tree. The needle was removed over a 018 wire which was upsized to the 3 J included in the Accustick set. A copy catheter was advanced over the J-wire was exchanged for a Glidewire. The Kumpe be was advanced over the glidewire into the  duodenum across the common bile duct obstruction. A copy was exchanged for a 10 dilator then a 10 [place] biliary drain. This was looped in the duodenum, string fixed common and sewn to the skin. Contrast was injected.  COMPLICATIONS: None  FINDINGS: Imaging documents needle access into the biliary tree within the lateral segment of the left lobe of the liver. Subsequent images demonstrate placement of a left internal external biliary drain with its tip coiled in the duodenum.  IMPRESSION: Successful left 10 French internal external biliary drainage. This will be capped after 24-48 hr. She will follow-up with general surgery regarding consultation of resection.   Electronically Signed   By: Maryclare Bean M.D.   On: 07/06/2013 14:15   Ir US Guide Bx Asp/drain  07/06/2013   CLINICAL DATA:  Pancreatic head mass  EXAM: PERCUTANEOUS TRANSHEPATIC CHOLANGIOGRAM; IR ULTRASOUND GUIDANCE TISSUE ABLATION; NEPHROSTOMY REMOVAL  MEDICATIONS AND MEDICAL HISTORY: Versed 4.0 mg, Fentanyl 2 1 mcg.  Additional Medications: Zosyn.  ANESTHESIA/SEDATION: Moderate sedation time: 20 minutes  CONTRAST:  50 cc Omnipaque  300  FLUOROSCOPY TIME:  5 min and 56 seconds.  PROCEDURE: The procedure, risks, benefits, and alternatives were explained to the patient. Questions regarding the procedure were encouraged and answered. The patient understands and consents to the procedure.  The epigastrium was prepped with Betadine in a sterile fashion, and a sterile drape was applied covering the operative field. A sterile gown and sterile gloves were used for the procedure.  Under sonographic guidance, a 22 gauge Chiba needle was inserted into a peripheral biliary duct within the lateral segment of the left lobe. After aspirating bile, contrast was injected opacifying the biliary tree. The needle was removed over a 018 wire which was upsized to the 3 J included in the Accustick set. A copy catheter was advanced over the J-wire was exchanged for a Glidewire.  The Kumpe be was advanced over the glidewire into the duodenum across the common bile duct obstruction. A copy was exchanged for a 10 dilator then a 10 [place] biliary drain. This was looped in the duodenum, string fixed common and sewn to the skin. Contrast was injected.  COMPLICATIONS: None  FINDINGS: Imaging documents needle access into the biliary tree within the lateral segment of the left lobe of the liver. Subsequent images demonstrate placement of a left internal external biliary drain with its tip coiled in the duodenum.  IMPRESSION: Successful left 10 French internal external biliary drainage. This will be capped after 24-48 hr. She will follow-up with general surgery regarding consultation of resection.   Electronically Signed   By: Maryclare Bean M.D.   On: 07/06/2013 14:15   Dg C-arm 1-60 Min-no Report  07/05/2013   CLINICAL DATA: Bile duct obstrustion, pancreatic ca   C-ARM 1-60 MINUTES  Fluoroscopy was utilized by the requesting physician.  No radiographic  interpretation.      Medications: Scheduled: . cholecalciferol  1,000 Units Oral Daily  . docusate sodium  100 mg Oral BID  . lisinopril  40 mg Oral BID  . multivitamin with minerals  1 tablet Oral Daily  . pantoprazole  40 mg Oral Daily  . verapamil  120 mg Oral Daily   Continuous: . sodium chloride 75 mL/hr at 07/07/13 0048  . lactated ringers       Assessment/Plan: Principal Problem:   Obstructive jaundice Active Problems:   Essential hypertension   Hyperlipidemia   Pancreatic mass   Abdominal pain   Pruritus   Acute pancreatitis   Protein-calorie malnutrition, severe   Obstructive Jaundice c Ab pain and admitted c bilirubin of 13.9, lipase 754, AST 345, ALT 533. CT of abdomen and pelvis revealed a 4 cm mass in the head of the pancreas and uncinate process concerning for pancreatic cancer.  CA19-9 is only 2.4 - this is reassuring.  S/p multiple failed attempts 9/30 for ERCP/stenting - aborted due to pancreas  obstruction was too sig.  Re-attempt 10/1 unsuccessfully but EUS Bx x 3 done worrisome for adenoCA.  S/P IR placed perc drain/L 10 Fr int/ext biliary drain. Cytology of FNA c/w single and small clusters of Malignant glandular cells in the background of tumor Necrosis. C/W Adenocarcinoma.  My understanding is that there are some more Bxs Pending?  Dr Buccini's note from yesterday stated: "Final pathology report pending from 07/05/13 procedure, but I advised the patient that the preliminary impression was adenocarcinoma. I have discussed her case with Dr. Almond Lint of surgery. Her recommendation was to proceed with percutaneous drainage of the bile duct, since her bilirubin is greater than  8. This was done 07/06/13. Meanwhile, she has recommended a CT of the chest, which is ordered for 07/07/13. The current plan would be for the patient to go home soon, with outpatient followup to be arranged with Dr. Donell Beers, at which time the pros and cons of surgical management could be discussed".  I have a call into Oncology today to see if they want a PET scan and see if they can see her prior to D/c.  Will ask SW to eval for [facility] needs.   Ms Pralle asked me to call daughter Dondra Spry and update her - # given was [phone].  I called and discussed the case last night.  LFTs 07/05/13: Alk Phos 737, AST 344 and ALT 370 yesterday c T Bili 13.8.  07/06/13  labs are Alk Phos 761, AST 352 and ALT 377 c T Bili 14.5. Anticipate better results today and tomorrow. They just came back Alk Phos 803/AST 193/ALT 354/ Total Bili 7.0.    Pancreatitis on admission but Lipase dropped from 723 to 81. Recurrent pancreatitis (4/08 and 9/11).    Pruritus- secondary to jaundice. Treat with Atarax and creams as needed/ordered.  HTN - BP currently OK to a high.  HCTZ held as she is at risk of DeHydration.  OK for slightly high BP and we can adjust as an outpatient.   GERD - PPI. Nexium at home   Non Toxic Multinodular goiter/Thyromegaly -  Thyroid bx--Follicular Lesion  TSH 0.160 but OK- following  Hyperlipidemia- hold statin due to elevated liver function tests.  Constipation - prns.  Mild anemia - post hydration - Follow. 13.4 - 10.9 - 10.7 - 10.1 - 10.3 - 10.7 = Stable.  Repeat CBC as outpatient.  Current WBC is up post procedure s evidence of infection - follow.  DVT Prophylaxis provided  Dispo remains the same - S/p IR procedure.  Anticipate Dc home in 24-48 hrs when #s are better and she can eat. Encourage to walk today. OK to shower.  If adenocarcinoma is confirmed - will need to see Onc and Gen Surgery. Surgical tumor resection vrs Biliary Bypass would need to be entertained for comfort plus treatment. If this is the only tumor and no distant mets she will have more options.  If prognosis is poor hospice/DNR may need to be entertained.  Needs one dose lasix for edema and needs to have a BM today.  SEVERE MALNUTRITION in the context of Acute Illness - See Nutrition notes. On Protein shakes.    LOS: 5 days   Christianne Zacher M 07/07/2013, 8:07 AM   I called in consult to Onc - Awaiting their report. Should be able to go home 07/08/13

## 2013-07-08 LAB — CBC
HCT: 29.1 % — ABNORMAL LOW (ref 36.0–46.0)
Hemoglobin: 9.6 g/dL — ABNORMAL LOW (ref 12.0–15.0)
MCH: 27.4 pg (ref 26.0–34.0)
MCHC: 33 g/dL (ref 30.0–36.0)
RBC: 3.51 MIL/uL — ABNORMAL LOW (ref 3.87–5.11)

## 2013-07-08 LAB — PREALBUMIN: Prealbumin: 10.4 mg/dL — ABNORMAL LOW (ref 17.0–34.0)

## 2013-07-08 LAB — CANCER ANTIGEN 19-9: CA 19-9: 3.4 U/mL — ABNORMAL LOW (ref <35.0)

## 2013-07-08 LAB — COMPREHENSIVE METABOLIC PANEL
ALT: 232 U/L — ABNORMAL HIGH (ref 0–35)
AST: 88 U/L — ABNORMAL HIGH (ref 0–37)
Albumin: 2.3 g/dL — ABNORMAL LOW (ref 3.5–5.2)
CO2: 25 mEq/L (ref 19–32)
Calcium: 8.6 mg/dL (ref 8.4–10.5)
Creatinine, Ser: 0.82 mg/dL (ref 0.50–1.10)
GFR calc Af Amer: 79 mL/min — ABNORMAL LOW (ref >90)
GFR calc non Af Amer: 68 mL/min — ABNORMAL LOW (ref >90)
Glucose, Bld: 107 mg/dL — ABNORMAL HIGH (ref 70–99)
Potassium: 3.6 mEq/L (ref 3.5–5.1)
Sodium: 141 mEq/L (ref 135–145)
Total Protein: 5.6 g/dL — ABNORMAL LOW (ref 6.0–8.3)

## 2013-07-08 NOTE — Progress Notes (Signed)
For drain care, Biliary drain needs to be flushed by [facility] with 5 cc NS daily and record drainage BID daily .  Pt's daughter stated that [facility] has already been arranged and called them this am.

## 2013-07-08 NOTE — Discharge Summary (Signed)
DISCHARGE SUMMARY  Carolyn [name]  MR#: [id number]  DOB:75-Sep-1937  Date of Admission: 07/02/2013 Date of Discharge: 07/08/2013  Attending Physician:Colman Birdwell A  Patient's BMW:UXLKG,MWNU M, MD  Consults:Treatment Team:  Gwen Pounds, MD Florencia Reasons, MD Josph Macho, MD  Discharge Diagnoses: Principal Problem:   Obstructive jaundice Active Problems:   Essential hypertension   Hyperlipidemia   Pancreatic mass   Abdominal pain   Pruritus   Acute pancreatitis   Protein-calorie malnutrition, severe   Discharge Medications:   Medication List    STOP taking these medications       ezetimibe-simvastatin 10-40 MG per tablet  Commonly known as:  VYTORIN     hydrochlorothiazide 25 MG tablet  Commonly known as:  HYDRODIURIL      TAKE these medications       cholecalciferol 1000 UNITS tablet  Commonly known as:  VITAMIN D  Take 1,000 Units by mouth daily.     diphenhydrAMINE 25 mg capsule  Commonly known as:  BENADRYL  Take 25 mg by mouth daily as needed for itching.     DSS 100 MG Caps  Take 100 mg by mouth 2 (two) times daily.     esomeprazole 40 MG capsule  Commonly known as:  NEXIUM  Take 40 mg by mouth daily before breakfast.     feeding supplement Liqd  Take 237 mLs by mouth 2 (two) times daily between meals as needed (Please offer to pt if eating <50% of meals).     HYDROcodone-acetaminophen 5-325 MG per tablet  Commonly known as:  NORCO/VICODIN  Take 1 tablet by mouth every 6 (six) hours as needed.     hydrOXYzine 25 MG tablet  Commonly known as:  ATARAX/VISTARIL  Take 1 tablet (25 mg total) by mouth 3 (three) times daily as needed for itching.     lisinopril 40 MG tablet  Commonly known as:  PRINIVIL,ZESTRIL  Take 40 mg by mouth 2 (two) times daily.     multivitamin with minerals Tabs tablet  Take 1 tablet by mouth daily.     naproxen sodium 220 MG tablet  Commonly known as:  ANAPROX  Take 440 mg by mouth daily as needed (pain).      NONFORMULARY OR COMPOUNDED ITEM  Apply 1 application topically 4 (four) times daily as needed (Itching).     polyethylene glycol packet  Commonly known as:  MIRALAX / GLYCOLAX  Take 17 g by mouth daily as needed.     verapamil 120 MG CR tablet  Commonly known as:  CALAN-SR  Take 120 mg by mouth daily.        Hospital Procedures: Dg Chest 2 View  07/02/2013   CLINICAL DATA:  Fifteen. Hypertension.  Abdominal pain.  EXAM: CHEST  2 VIEW  COMPARISON:  03/06/2010  FINDINGS: The heart size and mediastinal contours are within normal limits. Both lungs are clear. Atherosclerotic calcification and mild tortuosity of thoracic aorta are stable. The visualized skeletal structures are unremarkable.  IMPRESSION: No active cardiopulmonary disease.   Electronically Signed   By: Myles Rosenthal   On: 07/02/2013 12:13   Ct Chest W Contrast  07/07/2013   CLINICAL DATA:  Weakness, shortness of breath. Recently diagnosed pancreatic mass.  EXAM: CT CHEST WITH CONTRAST  TECHNIQUE: Multidetector CT imaging of the chest was performed during intravenous contrast administration.  CONTRAST:  80mL OMNIPAQUE IOHEXOL 300 MG/ML  SOLN  COMPARISON:  Chest radiograph dated 07/02/2013  FINDINGS: Trace right pleural effusion with  associated mild dependent atelectasis at the right lung base.  Minimal subpleural nodularity in the anterior right upper lobe measuring up to 4 mm (series 3/ images 18 and 22). These findings are not worrisome for metastatic disease. Mild centrilobular emphysematous changes. No pneumothorax.  Thyroid is enlarged/nodular, better evaluated on prior thyroid ultrasound.  The heart is top-normal in size. No pericardial effusion. Coronary atherosclerosis. Atherosclerotic calcifications of the aortic arch. Ectasia of the descending thoracic aorta measuring up to 3.2 cm (series 2/ image 44).  No suspicious mediastinal, hilar, or axillary lymphadenopathy.  Visualized upper abdomen is notable for a percutaneous  biliary drain but is otherwise unchanged from recent CT.  Degenerative changes of the visualized thoracolumbar spine.  IMPRESSION: No findings suspicious for metastatic disease in the chest.  Minimal subpleural nodularity in the anterior right upper lobe measuring up to 4 mm, likely benign. Given high risk for primary bronchogenic neoplasm, a single follow-up CT chest is suggested in 12 months.  Trace right pleural effusion with associated mild dependent atelectasis.  This recommendation follows the consensus statement: Guidelines for Management of Small Pulmonary Nodules Detected on CT Scans: A Statement from the Fleischner Society as published in Radiology 2005; 237:395-400.   Electronically Signed   By: Charline Bills M.D.   On: 07/07/2013 10:14   Ct Abdomen Pelvis W Contrast  07/02/2013   CLINICAL DATA:  Jaundice. Abdominal pain and vomiting.  Dark urine.  EXAM: CT ABDOMEN AND PELVIS WITH CONTRAST  TECHNIQUE: Multidetector CT imaging of the abdomen and pelvis was performed using the standard protocol following bolus administration of intravenous contrast.  CONTRAST:  80mL OMNIPAQUE IOHEXOL 300 MG/ML  SOLN  COMPARISON:  06/13/2010  FINDINGS: Prior cholecystectomy again noted as well as multiple small hepatic cysts, however there is diffuse biliary and pancreatic ductal dilatation which is new since previous study. A heterogeneous mass is seen involving the pancreatic head and uncinate process which measures 4.1 x 3.8 x 3.2 and is highly suspicious for pancreatic carcinoma.  No evidence of peripancreatic lymphadenopathy or inflammatory changes. No soft tissue masses or lymphadenopathy seen elsewhere within the abdomen or pelvis.  Prior hysterectomy noted. Adnexal regions are unremarkable. Diverticulosis is seen mainly involving the sigmoid colon, however there is no evidence of diverticulitis. Normal appendix is visualized. No evidence of dilated bowel loops. No suspicious bone lesions identified.   IMPRESSION: Diffuse biliary and pancreatic ductal dilatation, with 4 cm mass in the pancreatic head and uncinate process, highly suspicious for pancreatic carcinoma. Consider ERCP or MRCP for further evaluation.  No evidence of metastatic disease. Stable hepatic cysts.  Diverticulosis. No radiographic evidence of diverticulitis.   Electronically Signed   By: Myles Rosenthal   On: 07/02/2013 14:32   Ir Biliary Drain Catheter Placement  07/06/2013   CLINICAL DATA:  Pancreatic head mass  EXAM: PERCUTANEOUS TRANSHEPATIC CHOLANGIOGRAM; IR ULTRASOUND GUIDANCE TISSUE ABLATION; NEPHROSTOMY REMOVAL  MEDICATIONS AND MEDICAL HISTORY: Versed 4.0 mg, Fentanyl 2 1 mcg.  Additional Medications: Zosyn.  ANESTHESIA/SEDATION: Moderate sedation time: 20 minutes  CONTRAST:  50 cc Omnipaque 300  FLUOROSCOPY TIME:  5 min and 56 seconds.  PROCEDURE: The procedure, risks, benefits, and alternatives were explained to the patient. Questions regarding the procedure were encouraged and answered. The patient understands and consents to the procedure.  The epigastrium was prepped with Betadine in a sterile fashion, and a sterile drape was applied covering the operative field. A sterile gown and sterile gloves were used for the procedure.  Under sonographic guidance, a  22 gauge Chiba needle was inserted into a peripheral biliary duct within the lateral segment of the left lobe. After aspirating bile, contrast was injected opacifying the biliary tree. The needle was removed over a 018 wire which was upsized to the 3 J included in the Accustick set. A copy catheter was advanced over the J-wire was exchanged for a Glidewire. The Kumpe be was advanced over the glidewire into the duodenum across the common bile duct obstruction. A copy was exchanged for a 10 dilator then a 10 [place] biliary drain. This was looped in the duodenum, string fixed common and sewn to the skin. Contrast was injected.  COMPLICATIONS: None  FINDINGS: Imaging documents needle  access into the biliary tree within the lateral segment of the left lobe of the liver. Subsequent images demonstrate placement of a left internal external biliary drain with its tip coiled in the duodenum.  IMPRESSION: Successful left 10 French internal external biliary drainage. This will be capped after 24-48 hr. She will follow-up with general surgery regarding consultation of resection.   Electronically Signed   By: Maryclare Bean M.D.   On: 07/06/2013 14:15   Ir Ptc  07/06/2013   CLINICAL DATA:  Pancreatic head mass  EXAM: PERCUTANEOUS TRANSHEPATIC CHOLANGIOGRAM; IR ULTRASOUND GUIDANCE TISSUE ABLATION; NEPHROSTOMY REMOVAL  MEDICATIONS AND MEDICAL HISTORY: Versed 4.0 mg, Fentanyl 2 1 mcg.  Additional Medications: Zosyn.  ANESTHESIA/SEDATION: Moderate sedation time: 20 minutes  CONTRAST:  50 cc Omnipaque 300  FLUOROSCOPY TIME:  5 min and 56 seconds.  PROCEDURE: The procedure, risks, benefits, and alternatives were explained to the patient. Questions regarding the procedure were encouraged and answered. The patient understands and consents to the procedure.  The epigastrium was prepped with Betadine in a sterile fashion, and a sterile drape was applied covering the operative field. A sterile gown and sterile gloves were used for the procedure.  Under sonographic guidance, a 22 gauge Chiba needle was inserted into a peripheral biliary duct within the lateral segment of the left lobe. After aspirating bile, contrast was injected opacifying the biliary tree. The needle was removed over a 018 wire which was upsized to the 3 J included in the Accustick set. A copy catheter was advanced over the J-wire was exchanged for a Glidewire. The Kumpe be was advanced over the glidewire into the duodenum across the common bile duct obstruction. A copy was exchanged for a 10 dilator then a 10 [place] biliary drain. This was looped in the duodenum, string fixed common and sewn to the skin. Contrast was injected.  COMPLICATIONS: None   FINDINGS: Imaging documents needle access into the biliary tree within the lateral segment of the left lobe of the liver. Subsequent images demonstrate placement of a left internal external biliary drain with its tip coiled in the duodenum.  IMPRESSION: Successful left 10 French internal external biliary drainage. This will be capped after 24-48 hr. She will follow-up with general surgery regarding consultation of resection.   Electronically Signed   By: Maryclare Bean M.D.   On: 07/06/2013 14:15   Ir US Guide Bx Asp/drain  07/06/2013   CLINICAL DATA:  Pancreatic head mass  EXAM: PERCUTANEOUS TRANSHEPATIC CHOLANGIOGRAM; IR ULTRASOUND GUIDANCE TISSUE ABLATION; NEPHROSTOMY REMOVAL  MEDICATIONS AND MEDICAL HISTORY: Versed 4.0 mg, Fentanyl 2 1 mcg.  Additional Medications: Zosyn.  ANESTHESIA/SEDATION: Moderate sedation time: 20 minutes  CONTRAST:  50 cc Omnipaque 300  FLUOROSCOPY TIME:  5 min and 56 seconds.  PROCEDURE: The procedure, risks, benefits, and alternatives were  explained to the patient. Questions regarding the procedure were encouraged and answered. The patient understands and consents to the procedure.  The epigastrium was prepped with Betadine in a sterile fashion, and a sterile drape was applied covering the operative field. A sterile gown and sterile gloves were used for the procedure.  Under sonographic guidance, a 22 gauge Chiba needle was inserted into a peripheral biliary duct within the lateral segment of the left lobe. After aspirating bile, contrast was injected opacifying the biliary tree. The needle was removed over a 018 wire which was upsized to the 3 J included in the Accustick set. A copy catheter was advanced over the J-wire was exchanged for a Glidewire. The Kumpe be was advanced over the glidewire into the duodenum across the common bile duct obstruction. A copy was exchanged for a 10 dilator then a 10 [place] biliary drain. This was looped in the duodenum, string fixed common and sewn to  the skin. Contrast was injected.  COMPLICATIONS: None  FINDINGS: Imaging documents needle access into the biliary tree within the lateral segment of the left lobe of the liver. Subsequent images demonstrate placement of a left internal external biliary drain with its tip coiled in the duodenum.  IMPRESSION: Successful left 10 French internal external biliary drainage. This will be capped after 24-48 hr. She will follow-up with general surgery regarding consultation of resection.   Electronically Signed   By: Maryclare Bean M.D.   On: 07/06/2013 14:15   Dg C-arm 1-60 Min-no Report  07/05/2013   CLINICAL DATA: Bile duct obstrustion, pancreatic ca   C-ARM 1-60 MINUTES  Fluoroscopy was utilized by the requesting physician.  No radiographic  interpretation.    Dg C-arm 61-120 Min-no Report  07/04/2013   CLINICAL DATA: pancreatic mass   C-ARM 61-120 MINUTES  Fluoroscopy was utilized by the requesting physician.  No radiographic  interpretation.     History of Present Illness: [name] is a 77 year old African American female with a history of hypertension, recurrent pancreatitis (4/08 and 9/11), and GERD who presented to the emergency department with the complaint of abdominal pain. Patient states over the past week she's had a significant increase in crampy abdominal pain. The pain has been severe present during the day and at night. It is worse after eating. She has had some constipation. In addition, she noted that her urine was becoming darker, orange in color yesterday. The pain is similar to pain she had 2 years ago when she was evaluated by Dr. Madilyn Fireman. At that time she had an EGD and endoscopy that was unrevealing. The pain was attributed to reflux and has been absent for the past 2 years. In addition, she has had diffuse itching that began about 3 weeks ago. She is anxious seen in the ER on 9/9 for a rash and was diagnosed with contact dermatitis. The itching was initially around her neck and lips but has  now spread all over her entire body.  In the emergency department she is found to have a bilirubin of 13.9, lipase 754, AST 345, ALT 533. CT of abdomen and pelvis revealed a 4 cm mass in the head of the pancreas and uncinate process concerning for pancreatic cancer. She will be admitted for management of abdominal pain/pancreatitis and obstructive jaundice.   Hospital Course: Patient was admitted with obstructive jaundice and abdominal pain with a bilirubin axis of 13. Subsequent workup has demonstrated a 4 cm mass in the head of the pancreas and further staging  workup revealed no distant disease. Patient underwent interventional radiology drainage and has been seen by medical oncology. Pathology was consistent with adenocarcinoma ultimate surgical and eventually be considered next week in the outpatient setting. Beyond some residual pain she is doing much better tolerating by mouth's and much more comfortable. Home health has been arranged and she'll be followed up in the outpatient setting.  Day of Discharge Exam BP 161/46  Pulse 58  Temp(Src) 98.3 F (36.8 C) (Oral)  Resp 18  Ht 5\' 3"  (1.6 m)  Wt 84.823 kg (187 lb)  BMI 33.13 kg/m2  SpO2 99%  Physical Exam: General appearance: alert, cooperative and no distress Eyes: no scleral icterus Throat: oropharynx moist without erythema Resp: clear to auscultation bilaterally Cardio: regular rate and rhythm and systolic murmur: systolic ejection 2/6, crescendo at 2nd left intercostal space Extremities: no clubbing, cyanosis or trace to 1+ edema present Abdomen soft nontender right upper quadrant drain in place  Discharge Labs:  Recent Labs  07/07/13 0715 07/08/13 0643  NA 137 141  K 4.0 3.6  CL 104 105  CO2 23 25  GLUCOSE 98 107*  BUN 18 17  CREATININE 0.98 0.82  CALCIUM 9.3 8.6    Recent Labs  07/07/13 0715 07/08/13 0643  AST 193* 88*  ALT 354* 232*  ALKPHOS 803* 611*  BILITOT 7.0* 4.7*  PROT 6.5 5.6*  ALBUMIN 2.6* 2.3*     Recent Labs  07/07/13 0715 07/08/13 0643  WBC 14.0* 10.9*  HGB 10.7* 9.6*  HCT 32.9* 29.1*  MCV 83.3 82.9  PLT 254 243   No results found for this basename: CKTOTAL, CKMB, CKMBINDEX, TROPONINI,  in the last 72 hours No results found for this basename: TSH, T4TOTAL, FREET3, T3FREE, THYROIDAB,  in the last 72 hours No results found for this basename: VITAMINB12, FOLATE, FERRITIN, TIBC, IRON, RETICCTPCT,  in the last 72 hours  Discharge instructions:     Discharge Orders   Future Orders Complete By Expires   Diet - low sodium heart healthy  As directed    Increase activity slowly  As directed       Disposition: Home with daughter  Follow-up Appts: Patient to be seen by surgery in the outpatient setting as well as Dr. recent next week Condition on Discharge:   Tests Needing Follow-up: Labs next week by Dr. Timothy Lasso in the office  Signed: Perrie Ragin A 07/08/2013, 9:39 AM

## 2013-07-08 NOTE — Consult Note (Signed)
NAMEMarland Kitchen  PEACE, NOYES NO.:  [id number]  MEDICAL RECORD NO.:  [id number]  LOCATION:  6N13C                        FACILITY:  [facility]  PHYSICIAN:  Josph Macho, M.D.  DATE OF BIRTH:  [birth year]  DATE OF CONSULTATION:  07/07/2013 DATE OF DISCHARGE:                                CONSULTATION   REFERRING PHYSICIAN:  Gwen Pounds, MD  REASON FOR CONSULTATION:  Adenocarcinoma of the pancreas-likely localized.  HISTORY OF PRESENT ILLNESS:  Ms. Catala is a very charming 77 year old, African American female.  She is followed by Dr. Timothy Lasso.  She apparently has been having some problems with abdominal pain and eating over the past few weeks.  She has had some nausea, but no vomiting.  She has lost about 20 pounds she says.  She has had some back discomfort.  She began to get weaker.  She finally got to the point where she needed to come to the hospital.  When she was admitted, which was on July 02, 2013, she was found to be markedly jaundiced.  She had laboratory studies, which showed a bilirubin of 12.6.  SGPT was 367, SGOT was 315. Albumin was 2.3.  Lipase was quite high at 723.  On July 05, 2013, a bilirubin was 13.8.  Her LFTs were still elevated.  She did undergo a CT of the abdomen and pelvis on the 28th.  Shockingly, this showed a mass in the pancreatic head and uncinate process.  This measured 4.1 x 3.8 x 3.2 cm.  There is no obvious metastatic disease. There is no peripancreatic adenopathy.  She had no ascites.  She had prior hysterectomy.  She had a prior cholecystectomy.  She had diffuse biliary and pancreatic ductal dilation.  She did have a CA19-9 drawn.  This was only 2.4.  A CBC done when she was admitted showed a white cell count of 10.5, hemoglobin 10.9, hematocrit 30.8, platelet count 251.  She was seen by Dr. Willis Modena.  He went ahead and did a endoscopic ultrasound on her.  This was done, I think on the 1st.  This showed a round  ill-defined mass measuring 2.5 x 2.5 cm.  This is in the head of the pancreas.  It was abutting and possibly invading the portal/SMV confluence.  There is no adenopathy.  She had no involvement of the celiac artery or SMA.  She had a lesion biopsied 3 times.  The pathology report ([id number]) showed malignant cells consistent with Adenocarcinoma. She could not get a stent placed despite multiple attempts via EGD and IR placed a PTC.  We were asked to see her to try to help with management decisions.  Overall, I will have to say that her performance status is quite good. She really looks healthy.  She is really not lost a lot of weight considering how much she weighs.  She was very functional before she got sick.  Of note, her ProTime was 12.4 seconds prior to admission.  PAST MEDICAL HISTORY:  Remarkable for: 1. Hypertension. 2. Hyperlipidemia. 3. GERD.  ALLERGIES:  "Other."  MEDICATION ON ADMISSION: 1. Vitamin D 1000 units p.o. daily. 2. Benadryl 25 mg p.o.  daily p.r.n. 3. Nexium 40 mg p.o. every day. 4. Lisinopril 40 mg p.o. b.i.d. 5. Verapamil SR 120 mg p.o. daily. 6. Vytorin (10/40) 1 p.o. daily. 7. Hydrochlorothiazide 25 mg p.o. daily.  SOCIAL HISTORY:  Negative for tobacco or alcohol use.  She has no obvious occupational exposures.  FAMILY HISTORY:  I think unremarkable for malignancy.  REVIEW OF SYSTEMS:  As stated in the history of present illness.  PHYSICAL EXAMINATION:  GENERAL:  This is a well-developed, well- nourished [place] American female, in no obvious distress. VITAL SIGNS:  Temperature of 97.9, pulse 61, respiratory rate 18, blood pressure 153/49. HEAD AND NECK:  Normocephalic, atraumatic skull.  She has no ocular or oral lesions.  There is still some scleral icterus.  She has no adenopathy in the neck. LUNGS:  Clear bilaterally. CARDIAC:  Regular rate and rhythm with a normal S1 and S2.  There are no murmurs, rubs, or bruits.  ABDOMEN:  Soft.  She  has good bowel sounds. She has a percutaneous biliary drainage tube in the abdomen.  There may be some slight tenderness in the right upper quadrant.  I cannot palpate any hepatosplenomegaly. BACK:  No tenderness over the spine, ribs, or hips. EXTREMITIES:  No clubbing, cyanosis, or edema.  She has good range of motion of her joints. SKIN:  No rashes.  There may be some slight icterus with her skin.  She has no ecchymoses or petechia. NEUROLOGICAL:  No focal neurological deficits.  LABORATORY STUDIES:  White cell count 14, hemoglobin 10.7, hematocrit 32.9, platelet count 254.  Bilirubin 7.  SGPT 354, SGOT 193.  Albumin is 2.6.  Alkaline phosphatase 803.  BUN 18, creatinine 1.  Sodium 137, potassium 4.0.  IMPRESSION:  Ms. Eckert is a very charming 77 year old, African American female.  She actually has been quite healthy prior to this illness.  It looks like she has localized pancreatic cancer.  At least by the endoscopic ultrasound, one had to think that this would be considered stage I disease.  It did not show any adenopathy or involvment of other organs.  I think that she would be a surgical candidate.  It looks as if her Superior mesenteric artery and celiac arteries are patent without encroachment by the tumor.  The tumor does encroach upon the superior mesenteric vein and portal vein confluence.  I am not sure this would  be a problem with regards to surgery.  I think that one issue is that she is still jaundiced.  She still has biliary obstruction.  This is improving with the biliary drainage tube.  Surgical Oncology hopefully will see her this weekend.  If Surgical Oncology feels that neoadjuvant chemo and radiation therapy are indicated, we can certainly do this.  I would treat her with Xeloda along with radiation therapy.  I do not see any other tests that we need to get done on her.  Her family was with her.  I spent a long time talking to them.  I explained the  different options that we have right now.  Again, a lot will be dependent upon what surgery feels is available right now.  Again, she is 77 years old.  She is in great shape from my point of view.  She certainly has the weight on her that she could withstand therapy and surgery.  We will follow along.  We will be more than happy to help out in any way that we can.     Josph Macho,  M.D.     PRE/MEDQ  D:  07/07/2013  T:  07/08/2013  Job:  [id number]  cc:   Gwen Pounds, MD

## 2013-07-08 NOTE — Progress Notes (Signed)
3 Days Post-Op  Subjective: Biliary drain placed 10/2 Continues to feel, better. Eating reg diet, though having some pain after eating. No N/V Had BM too. Draining well, minimal pain at drain site EUS bx panc mass 10/1 c/w adenocarcinoma   Objective: Vital signs in last 24 hours: Temp:  [97.9 F (36.6 C)-98.7 F (37.1 C)] 98.3 F (36.8 C) (10/04 4098) Pulse Rate:  [58-84] 58 (10/04 0608) Resp:  [18] 18 (10/04 0608) BP: (153-164)/(40-77) 161/46 mmHg (10/04 0608) SpO2:  [95 %-99 %] 99 % (10/04 0608) Last BM Date: 07/08/13   Site of Bili drain intact; NT Clean and dry Output 1660cc yesterday Output bilious LFTs decreased T Bili: 4.7    Lab Results:   Recent Labs  07/07/13 0715 07/08/13 0643  WBC 14.0* 10.9*  HGB 10.7* 9.6*  HCT 32.9* 29.1*  PLT 254 243   BMET  Recent Labs  07/07/13 0715 07/08/13 0643  NA 137 141  K 4.0 3.6  CL 104 105  CO2 23 25  GLUCOSE 98 107*  BUN 18 17  CREATININE 0.98 0.82  CALCIUM 9.3 8.6   PT/INR No results found for this basename: LABPROT, INR,  in the last 72 hours ABG No results found for this basename: PHART, PCO2, PO2, HCO3,  in the last 72 hours  Studies/Results: Ct Chest W Contrast  07/07/2013   CLINICAL DATA:  Weakness, shortness of breath. Recently diagnosed pancreatic mass.  EXAM: CT CHEST WITH CONTRAST  TECHNIQUE: Multidetector CT imaging of the chest was performed during intravenous contrast administration.  CONTRAST:  80mL OMNIPAQUE IOHEXOL 300 MG/ML  SOLN  COMPARISON:  Chest radiograph dated 07/02/2013  FINDINGS: Trace right pleural effusion with associated mild dependent atelectasis at the right lung base.  Minimal subpleural nodularity in the anterior right upper lobe measuring up to 4 mm (series 3/ images 18 and 22). These findings are not worrisome for metastatic disease. Mild centrilobular emphysematous changes. No pneumothorax.  Thyroid is enlarged/nodular, better evaluated on prior thyroid ultrasound.  The heart  is top-normal in size. No pericardial effusion. Coronary atherosclerosis. Atherosclerotic calcifications of the aortic arch. Ectasia of the descending thoracic aorta measuring up to 3.2 cm (series 2/ image 44).  No suspicious mediastinal, hilar, or axillary lymphadenopathy.  Visualized upper abdomen is notable for a percutaneous biliary drain but is otherwise unchanged from recent CT.  Degenerative changes of the visualized thoracolumbar spine.  IMPRESSION: No findings suspicious for metastatic disease in the chest.  Minimal subpleural nodularity in the anterior right upper lobe measuring up to 4 mm, likely benign. Given high risk for primary bronchogenic neoplasm, a single follow-up CT chest is suggested in 12 months.  Trace right pleural effusion with associated mild dependent atelectasis.  This recommendation follows the consensus statement: Guidelines for Management of Small Pulmonary Nodules Detected on CT Scans: A Statement from the Fleischner Society as published in Radiology 2005; 237:395-400.   Electronically Signed   By: Charline Bills M.D.   On: 07/07/2013 10:14   Ir Biliary Drain Catheter Placement  07/06/2013   CLINICAL DATA:  Pancreatic head mass  EXAM: PERCUTANEOUS TRANSHEPATIC CHOLANGIOGRAM; IR ULTRASOUND GUIDANCE TISSUE ABLATION; NEPHROSTOMY REMOVAL  MEDICATIONS AND MEDICAL HISTORY: Versed 4.0 mg, Fentanyl 2 1 mcg.  Additional Medications: Zosyn.  ANESTHESIA/SEDATION: Moderate sedation time: 20 minutes  CONTRAST:  50 cc Omnipaque 300  FLUOROSCOPY TIME:  5 min and 56 seconds.  PROCEDURE: The procedure, risks, benefits, and alternatives were explained to the patient. Questions regarding the procedure were encouraged  and answered. The patient understands and consents to the procedure.  The epigastrium was prepped with Betadine in a sterile fashion, and a sterile drape was applied covering the operative field. A sterile gown and sterile gloves were used for the procedure.  Under sonographic  guidance, a 22 gauge Chiba needle was inserted into a peripheral biliary duct within the lateral segment of the left lobe. After aspirating bile, contrast was injected opacifying the biliary tree. The needle was removed over a 018 wire which was upsized to the 3 J included in the Accustick set. A copy catheter was advanced over the J-wire was exchanged for a Glidewire. The Kumpe be was advanced over the glidewire into the duodenum across the common bile duct obstruction. A copy was exchanged for a 10 dilator then a 10 [place] biliary drain. This was looped in the duodenum, string fixed common and sewn to the skin. Contrast was injected.  COMPLICATIONS: None  FINDINGS: Imaging documents needle access into the biliary tree within the lateral segment of the left lobe of the liver. Subsequent images demonstrate placement of a left internal external biliary drain with its tip coiled in the duodenum.  IMPRESSION: Successful left 10 French internal external biliary drainage. This will be capped after 24-48 hr. She will follow-up with general surgery regarding consultation of resection.   Electronically Signed   By: Maryclare Bean M.D.   On: 07/06/2013 14:15   Ir Ptc  07/06/2013   CLINICAL DATA:  Pancreatic head mass  EXAM: PERCUTANEOUS TRANSHEPATIC CHOLANGIOGRAM; IR ULTRASOUND GUIDANCE TISSUE ABLATION; NEPHROSTOMY REMOVAL  MEDICATIONS AND MEDICAL HISTORY: Versed 4.0 mg, Fentanyl 2 1 mcg.  Additional Medications: Zosyn.  ANESTHESIA/SEDATION: Moderate sedation time: 20 minutes  CONTRAST:  50 cc Omnipaque 300  FLUOROSCOPY TIME:  5 min and 56 seconds.  PROCEDURE: The procedure, risks, benefits, and alternatives were explained to the patient. Questions regarding the procedure were encouraged and answered. The patient understands and consents to the procedure.  The epigastrium was prepped with Betadine in a sterile fashion, and a sterile drape was applied covering the operative field. A sterile gown and sterile gloves were used for  the procedure.  Under sonographic guidance, a 22 gauge Chiba needle was inserted into a peripheral biliary duct within the lateral segment of the left lobe. After aspirating bile, contrast was injected opacifying the biliary tree. The needle was removed over a 018 wire which was upsized to the 3 J included in the Accustick set. A copy catheter was advanced over the J-wire was exchanged for a Glidewire. The Kumpe be was advanced over the glidewire into the duodenum across the common bile duct obstruction. A copy was exchanged for a 10 dilator then a 10 [place] biliary drain. This was looped in the duodenum, string fixed common and sewn to the skin. Contrast was injected.  COMPLICATIONS: None  FINDINGS: Imaging documents needle access into the biliary tree within the lateral segment of the left lobe of the liver. Subsequent images demonstrate placement of a left internal external biliary drain with its tip coiled in the duodenum.  IMPRESSION: Successful left 10 French internal external biliary drainage. This will be capped after 24-48 hr. She will follow-up with general surgery regarding consultation of resection.   Electronically Signed   By: Maryclare Bean M.D.   On: 07/06/2013 14:15   Ir US Guide Bx Asp/drain  07/06/2013   CLINICAL DATA:  Pancreatic head mass  EXAM: PERCUTANEOUS TRANSHEPATIC CHOLANGIOGRAM; IR ULTRASOUND GUIDANCE TISSUE ABLATION; NEPHROSTOMY REMOVAL  MEDICATIONS AND MEDICAL HISTORY: Versed 4.0 mg, Fentanyl 2 1 mcg.  Additional Medications: Zosyn.  ANESTHESIA/SEDATION: Moderate sedation time: 20 minutes  CONTRAST:  50 cc Omnipaque 300  FLUOROSCOPY TIME:  5 min and 56 seconds.  PROCEDURE: The procedure, risks, benefits, and alternatives were explained to the patient. Questions regarding the procedure were encouraged and answered. The patient understands and consents to the procedure.  The epigastrium was prepped with Betadine in a sterile fashion, and a sterile drape was applied covering the operative  field. A sterile gown and sterile gloves were used for the procedure.  Under sonographic guidance, a 22 gauge Chiba needle was inserted into a peripheral biliary duct within the lateral segment of the left lobe. After aspirating bile, contrast was injected opacifying the biliary tree. The needle was removed over a 018 wire which was upsized to the 3 J included in the Accustick set. A copy catheter was advanced over the J-wire was exchanged for a Glidewire. The Kumpe be was advanced over the glidewire into the duodenum across the common bile duct obstruction. A copy was exchanged for a 10 dilator then a 10 [place] biliary drain. This was looped in the duodenum, string fixed common and sewn to the skin. Contrast was injected.  COMPLICATIONS: None  FINDINGS: Imaging documents needle access into the biliary tree within the lateral segment of the left lobe of the liver. Subsequent images demonstrate placement of a left internal external biliary drain with its tip coiled in the duodenum.  IMPRESSION: Successful left 10 French internal external biliary drainage. This will be capped after 24-48 hr. She will follow-up with general surgery regarding consultation of resection.   Electronically Signed   By: Maryclare Bean M.D.   On: 07/06/2013 14:15    Anti-infectives: Anti-infectives   Start     Dose/Rate Route Frequency Ordered Stop   07/06/13 1100  piperacillin-tazobactam (ZOSYN) IVPB 3.375 g    Comments:  Will be given in in/by IR   3.375 g 12.5 mL/hr over 240 Minutes Intravenous  Once 07/06/13 0927 07/06/13 1644   07/05/13 1000  ciprofloxacin (CIPRO) IVPB 400 mg     400 mg 200 mL/hr over 60 Minutes Intravenous  Once 07/05/13 0958 07/05/13 1101   07/04/13 0600  cefTRIAXone (ROCEPHIN) 1 g in dextrose 5 % 50 mL IVPB     1 g 100 mL/hr over 30 Minutes Intravenous  Once 07/03/13 1009 07/04/13 5621      Assessment/Plan: s/p Procedure(s): ESOPHAGEAL ENDOSCOPIC ULTRASOUND (EUS) RADIAL (N/A) ENDOSCOPIC RETROGRADE  CHOLANGIOPANCREATOGRAPHY (ERCP) (N/A)  panc mass/ obstr jaundice Int/Ext Bili drain placed 10/2 Feels better- draining well Plan per Onc/CCS   LOS: 6 days    Brayton El 07/08/2013

## 2013-07-09 NOTE — Progress Notes (Signed)
07/09/2013 1420 Notified [facility] of dc home on 07/08/2013. Isidoro Donning RN CCM Case Mgmt phone [phone]

## 2013-07-12 ENCOUNTER — Ambulatory Visit (INDEPENDENT_AMBULATORY_CARE_PROVIDER_SITE_OTHER): Payer: Medicare Other | Admitting: General Surgery

## 2013-07-12 ENCOUNTER — Encounter (INDEPENDENT_AMBULATORY_CARE_PROVIDER_SITE_OTHER): Payer: Self-pay | Admitting: General Surgery

## 2013-07-12 VITALS — BP 128/42 | HR 75 | Temp 96.8°F | Ht 63.0 in | Wt 198.6 lb

## 2013-07-12 DIAGNOSIS — C259 Malignant neoplasm of pancreas, unspecified: Secondary | ICD-10-CM | POA: Insufficient documentation

## 2013-07-12 NOTE — Assessment & Plan Note (Signed)
Patient appears to have borderline resectable disease based on EUS findings of superficial portal vein invasion.    Recommend neoadjuvant chemoradiation.  Discussed with Dr. Myna Hidalgo.  I will send referral to radiation oncology as well.    Briefly reviewed surgery with patient and husband.    I discussed the rationale behind neoadjuvant therapy.    I also advised her to increase her protein intake as well as physical activity prior to surgery.    I will see back at the end of therapy to set up scan and surgery.

## 2013-07-12 NOTE — Progress Notes (Signed)
Chief Complaint  Patient presents with  . Follow-up    pancreatic mass    HISTORY: Patient is a 77 year old female who presents with a new diagnosis of pancreatic cancer. She had abdominal pain and obstructive jaundice. Gi was unable to place a stent with ERCP and so she had to have a percutaneous stent placed. This took multiple attempts to place due to the angulation with regard to the mass.  Her bilirubin has come down significantly. It is down to the 4 range. She denies diarrhea. She is not a diabetic. She does have a family history of breast cancer in her daughter.  She had extreme itching which is also improved with the biliary stenting. She has lost around 20 pounds. Some of this has been gained back since her hospital admission.   Past Medical History  Diagnosis Date  . Hypertension   . Hyperlipidemia   . GERD (gastroesophageal reflux disease)   . Asthma   . Obstructive jaundice 06/2013  . Pancreatitis   . Arthritis   . Cancer   . Heart murmur   . Thyroid disease     Past Surgical History  Procedure Laterality Date  . Neck surgery  2005/2007  . Hand surgery  2010  . Cataract extraction    . Abdominal hysterectomy    . Eus N/A 07/05/2013    Procedure: ESOPHAGEAL ENDOSCOPIC ULTRASOUND (EUS) RADIAL;  Surgeon: Willis Modena, MD;  Location: [facility]COPY;  Service: Endoscopy;  Laterality: N/A;  . Ercp N/A 07/05/2013    Procedure: ENDOSCOPIC RETROGRADE CHOLANGIOPANCREATOGRAPHY (ERCP);  Surgeon: Willis Modena, MD;  Location: [facility];  Service: Endoscopy;  Laterality: N/A;  . Buttock cyst removal    cholecystectomy  Current Outpatient Prescriptions  Medication Sig Dispense Refill  . cholecalciferol (VITAMIN D) 1000 UNITS tablet Take 1,000 Units by mouth daily.      . diphenhydrAMINE (BENADRYL) 25 mg capsule Take 25 mg by mouth daily as needed for itching.      . docusate sodium 100 MG CAPS Take 100 mg by mouth 2 (two) times daily.  10 capsule  0  . esomeprazole (NEXIUM)  40 MG capsule Take 40 mg by mouth daily before breakfast.      . feeding supplement (ENSURE COMPLETE) LIQD Take 237 mLs by mouth 2 (two) times daily between meals as needed (Please offer to pt if eating <50% of meals).      Marland Kitchen HYDROcodone-acetaminophen (NORCO/VICODIN) 5-325 MG per tablet Take 1 tablet by mouth every 6 (six) hours as needed.  30 tablet  0  . hydrOXYzine (ATARAX/VISTARIL) 25 MG tablet Take 1 tablet (25 mg total) by mouth 3 (three) times daily as needed for itching.  30 tablet  0  . lisinopril (PRINIVIL,ZESTRIL) 40 MG tablet Take 40 mg by mouth 2 (two) times daily.       . Multiple Vitamin (MULTIVITAMIN WITH MINERALS) TABS tablet Take 1 tablet by mouth daily.      . naproxen sodium (ANAPROX) 220 MG tablet Take 440 mg by mouth daily as needed (pain).      . NONFORMULARY OR COMPOUNDED ITEM Apply 1 application topically 4 (four) times daily as needed (Itching).      . polyethylene glycol (MIRALAX / GLYCOLAX) packet Take 17 g by mouth daily as needed.  14 each  0  . verapamil (CALAN-SR) 120 MG CR tablet Take 120 mg by mouth daily.        No current facility-administered medications for this visit.  Allergies  Allergen Reactions  . Other Other (See Comments)    Alka seltzer caused severe swelling     Family History  Problem Relation Age of Onset  . Cancer Daughter     Breast     History   Social History  . Marital Status: Married    Spouse Name: N/A    Number of Children: N/A  . Years of Education: N/A   Social History Main Topics  . Smoking status: Current Some Day Smoker -- 1.00 packs/day for 20 years    Types: Cigarettes  . Smokeless tobacco: None     Comment: PACK A WEEK  . Alcohol Use: 0.6 oz/week    1 Glasses of wine per week     Comment: SOMETIMES  . Drug Use: No  . Sexual Activity: None     REVIEW OF SYSTEMS - PERTINENT POSITIVES ONLY: 12 point review of systems negative other than HPI and PMH except for unexpected weight loss, voice change,  abdominal pain, constipation, joint pain, weakness.    EXAM: Filed Vitals:   07/12/13 1550  BP: 128/42  Pulse: 75  Temp: 96.8 F (36 C)   Filed Weights   07/12/13 1550  Weight: 198 lb 9.6 oz (90.084 kg)     Gen:  No acute distress.   Neurological: Alert and oriented to person, place, and time. Antalgic gait.   Head: Normocephalic and atraumatic.  Eyes: Conjunctivae are normal. Pupils are equal, round, and reactive to light. No scleral icterus.  Neck: Normal range of motion. Neck supple. No tracheal deviation or thyromegaly present.  Cardiovascular: Normal rate, regular rhythm, normal heart sounds and intact distal pulses.  Exam reveals no gallop and no friction rub.  No murmur heard. Respiratory: Effort normal.  No respiratory distress. No chest wall tenderness. Breath sounds normal.  No wheezes, rales or rhonchi.  GI: Soft. Bowel sounds are normal. The abdomen is soft and nontender.  There is no rebound and no guarding. There is perc biliary drain with transparent bile in epigastric region.   Musculoskeletal: Normal range of motion. Extremities are nontender.  Lymphadenopathy: No cervical, preauricular, postauricular or axillary adenopathy is present Skin: Skin is warm and dry. No rash noted. No diaphoresis. No erythema. No pallor. No clubbing, cyanosis, or edema.   Psychiatric: Normal mood and affect. Behavior is normal. Judgment and thought content normal.    LABORATORY RESULTS: Available labs are reviewed  Pathology Diagnosis FINE NEEDLE ASPIRATION, ENDOSCOPIC, PANCREAS (SPECIMEN 1 OF 1 COLLECTED 07-06-13): MALIGNANT CELLS CONSISTENT WITH ADENOCARCINOMA.  Ca 19-9 3.4.  T bili down to 4.7.  Albumin 2.3.     RADIOLOGY RESULTS: See E-Chart or I-Site for most recent results.  Images and reports are reviewed. CT abd/pelvis IMPRESSION:  Diffuse biliary and pancreatic ductal dilatation, with 4 cm mass in  the pancreatic head and uncinate process, highly suspicious for   pancreatic carcinoma. Consider ERCP or MRCP for further evaluation.  No evidence of metastatic disease. Stable hepatic cysts.  Diverticulosis. No radiographic evidence of diverticulitis.   CT chest IMPRESSION:  No findings suspicious for metastatic disease in the chest.  Minimal subpleural nodularity in the anterior right upper lobe  measuring up to 4 mm, likely benign. Given high risk for primary  bronchogenic neoplasm, a single follow-up CT chest is suggested in  12 months.  Trace right pleural effusion with associated mild dependent  atelectasis.  This recommendation follows the consensus statement: Guidelines for  Management of Small Pulmonary Nodules Detected on  CT Scans: A  Statement from the Fleischner Society as published in Radiology  2005; 237:395-400.    ASSESSMENT AND PLAN: Pancreatic cancer Patient appears to have borderline resectable disease based on EUS findings of superficial portal vein invasion.    Recommend neoadjuvant chemoradiation.  Discussed with Dr. Myna Hidalgo.  I will send referral to radiation oncology as well.    Briefly reviewed surgery with patient and husband.    I discussed the rationale behind neoadjuvant therapy.    I also advised her to increase her protein intake as well as physical activity prior to surgery.   Her nutritional status is currently very poor.    I will see back at the end of therapy to set up scan and surgery.        30 min spent in counseling with patient.     Maudry Diego MD Surgical Oncology, General and Endocrine Surgery [facility]      Visit Diagnoses: 1. Pancreatic cancer     Primary Care Physician: Gwen Pounds, MD

## 2013-07-12 NOTE — Patient Instructions (Signed)
I recommend chemoradiation up front due to borderline resectable status.  This means because of the potential invasion into the main vein into the liver (portal vein).  Please make sure you do your best to get as much walking in as possible to keep your strength up.    I also recommend that you see genetics due to your family history of cancer.  We will refer you to radiation oncology and the cancer center dietitian.

## 2013-07-13 ENCOUNTER — Other Ambulatory Visit: Payer: Self-pay | Admitting: Hematology & Oncology

## 2013-07-13 ENCOUNTER — Telehealth: Payer: Self-pay | Admitting: Genetic Counselor

## 2013-07-13 DIAGNOSIS — C259 Malignant neoplasm of pancreas, unspecified: Secondary | ICD-10-CM

## 2013-07-13 NOTE — Telephone Encounter (Signed)
S/w pt and gve genetic appt 01/08 @ 11 w/[employer] [employer].

## 2013-07-14 ENCOUNTER — Telehealth: Payer: Self-pay | Admitting: Hematology & Oncology

## 2013-07-14 NOTE — Telephone Encounter (Signed)
Pt aware of 10-14 appointment

## 2013-07-17 ENCOUNTER — Encounter: Payer: Self-pay | Admitting: Radiation Oncology

## 2013-07-17 ENCOUNTER — Other Ambulatory Visit (HOSPITAL_COMMUNITY): Payer: Self-pay | Admitting: Internal Medicine

## 2013-07-17 ENCOUNTER — Other Ambulatory Visit: Payer: Self-pay | Admitting: Internal Medicine

## 2013-07-17 DIAGNOSIS — C259 Malignant neoplasm of pancreas, unspecified: Secondary | ICD-10-CM

## 2013-07-17 DIAGNOSIS — R109 Unspecified abdominal pain: Secondary | ICD-10-CM

## 2013-07-17 NOTE — Progress Notes (Signed)
GI Location of Tumor / Histology: Pancreas  Patient presented abdominal pain and obstructive  jaundice,recurrent pancreatitis, ,pruitis past few weeks Biopsies of * 07/05/13: DiagnosisFINE NEEDLE ASPIRATION, ENDOSCOPIC, PANCREAS (SPECIMEN 1 OF 1 COLLECTED 07-06-13):MALIGNANT CELLS CONSISTENT WITH ADENOCARCINOMA  Past/Anticipated interventions by surgeon, if any: Percutaneous biliary  Stent, drainage tube in abdomen, 07/06/13  Author: Altamease Oiler, MD Service: Radiology Author Type: Physician   Filed: 07/06/2013 1:53 PM Note Time: 07/06/2013 1:52 PM                      Past/Anticipated interventions by medical oncology, if any: Dr.Ennever seen 10//14, [facility], if chemo/radiation he will start Xeloda,   Weight changes, if any: loss 20 lbs   Bowel/Bladder complaints, if GNF:[id number],NGEXBM docusate bid, has biliary drain right side brown liquid inside bag,  Nausea / Vomiting, if WUX:[id number] this am,  has meds rx to pick up this afternoon  Pain issues, if any: abdominal pain, joint pain, weakness, feels really bad today,  SAFETY ISSUES:  Prior radiation? no  Pacemaker/ICD? no  Possible current pregnancy? n0  Is the patient on methotrexate? no  Current Complaints / other details:Married, 2 children, 3 grandchildren,  smoker 1ppdx 20 years,Cigarettes; quit 2 weeks ago,  1 glass wine per week,  Daughter hx breast cancer, lives in [place], 5 year survivor has 14 screws in her neck, 2 bars and a brace, last surgery 2008?

## 2013-07-18 ENCOUNTER — Ambulatory Visit (HOSPITAL_BASED_OUTPATIENT_CLINIC_OR_DEPARTMENT_OTHER): Payer: Medicare Other | Admitting: Hematology & Oncology

## 2013-07-18 ENCOUNTER — Other Ambulatory Visit: Payer: Self-pay | Admitting: Oncology

## 2013-07-18 ENCOUNTER — Ambulatory Visit: Payer: Medicare Other | Admitting: Nutrition

## 2013-07-18 ENCOUNTER — Other Ambulatory Visit (HOSPITAL_BASED_OUTPATIENT_CLINIC_OR_DEPARTMENT_OTHER): Payer: Medicare Other | Admitting: Lab

## 2013-07-18 ENCOUNTER — Ambulatory Visit (HOSPITAL_BASED_OUTPATIENT_CLINIC_OR_DEPARTMENT_OTHER): Payer: Medicare Other

## 2013-07-18 ENCOUNTER — Other Ambulatory Visit: Payer: Self-pay | Admitting: *Deleted

## 2013-07-18 VITALS — BP 129/45 | HR 55 | Temp 97.7°F | Resp 14 | Ht 63.0 in | Wt 196.0 lb

## 2013-07-18 DIAGNOSIS — C259 Malignant neoplasm of pancreas, unspecified: Secondary | ICD-10-CM

## 2013-07-18 DIAGNOSIS — R64 Cachexia: Secondary | ICD-10-CM

## 2013-07-18 LAB — CBC WITH DIFFERENTIAL (CANCER CENTER ONLY)
BASO#: 0 10*3/uL (ref 0.0–0.2)
Eosinophils Absolute: 0.2 10*3/uL (ref 0.0–0.5)
HCT: 33.7 % — ABNORMAL LOW (ref 34.8–46.6)
HGB: 11.1 g/dL — ABNORMAL LOW (ref 11.6–15.9)
LYMPH#: 4.9 10*3/uL — ABNORMAL HIGH (ref 0.9–3.3)
MONO#: 1.3 10*3/uL — ABNORMAL HIGH (ref 0.1–0.9)
NEUT#: 11.3 10*3/uL — ABNORMAL HIGH (ref 1.5–6.5)
NEUT%: 63.7 % (ref 39.6–80.0)
RBC: 4.02 10*6/uL (ref 3.70–5.32)
WBC: 17.8 10*3/uL — ABNORMAL HIGH (ref 3.9–10.0)

## 2013-07-18 LAB — CMP (CANCER CENTER ONLY)
AST: 82 U/L — ABNORMAL HIGH (ref 11–38)
Albumin: 3 g/dL — ABNORMAL LOW (ref 3.3–5.5)
BUN, Bld: 73 mg/dL — ABNORMAL HIGH (ref 7–22)
CO2: 24 mEq/L (ref 18–33)
Calcium: 9.5 mg/dL (ref 8.0–10.3)
Chloride: 97 mEq/L — ABNORMAL LOW (ref 98–108)
Glucose, Bld: 112 mg/dL (ref 73–118)
Potassium: 4 mEq/L (ref 3.3–4.7)
Sodium: 132 mEq/L (ref 128–145)
Total Protein: 7.6 g/dL (ref 6.4–8.1)

## 2013-07-18 LAB — TECHNOLOGIST REVIEW CHCC SATELLITE

## 2013-07-18 MED ORDER — DRONABINOL 2.5 MG PO CAPS: ORAL_CAPSULE | ORAL | Status: DC

## 2013-07-18 MED ORDER — METOCLOPRAMIDE HCL 5 MG/ML IJ SOLN
INTRAMUSCULAR | Status: AC
  Filled 2013-07-18: qty 2

## 2013-07-18 MED ORDER — METOCLOPRAMIDE HCL 10 MG PO TABS: 10.0000 mg | ORAL_TABLET | Freq: Four times a day (QID) | ORAL | Status: DC

## 2013-07-18 MED ORDER — ONDANSETRON 8 MG/50ML IVPB (CHCC)
8.0000 mg | Freq: Once | INTRAVENOUS | Status: AC
  Administered 2013-07-18: 8 mg via INTRAVENOUS

## 2013-07-18 MED ORDER — ONDANSETRON HCL 8 MG PO TABS: 8.0000 mg | ORAL_TABLET | Freq: Three times a day (TID) | ORAL | Status: DC | PRN

## 2013-07-18 MED ORDER — METOCLOPRAMIDE HCL 5 MG/ML IJ SOLN
10.0000 mg | Freq: Once | INTRAMUSCULAR | Status: AC
  Administered 2013-07-18: 10 mg via INTRAVENOUS

## 2013-07-18 MED ORDER — ONDANSETRON 8 MG/NS 50 ML IVPB
INTRAVENOUS | Status: AC
  Filled 2013-07-18: qty 8

## 2013-07-18 MED ORDER — PROCHLORPERAZINE 25 MG RE SUPP: 25.0000 mg | Freq: Two times a day (BID) | RECTAL | Status: DC | PRN

## 2013-07-18 MED ORDER — SODIUM CHLORIDE 0.9 % IV SOLN
INTRAVENOUS | Status: DC
  Administered 2013-07-18: 10:00:00 via INTRAVENOUS

## 2013-07-18 NOTE — Progress Notes (Signed)
Ms Arntz developed nausea and mild vomiting while being evaluated by B Pen[facility]; the patient had a normal BM the day before; she is taking narcotics for pain control; I believe she is nauseated either due to her cancer or the narcotics or both. She has no antinausea meds at home.  Luanna Cole will give the pt IVF and IV ondansetronnow. I have also called in anti-nausea medications to her pharmacy. They will be seeing Dr Myna Hidalgo later today and will follow up with him regarding her diagnosis and nausea and pain control.

## 2013-07-18 NOTE — Progress Notes (Signed)
77 year old female diagnosed with pancreas cancer.  She is a patient of Dr. Myna Hidalgo and Dr. Mitzi Hansen.  Past medical history includes hypertension, hyperlipidemia, GERD, asthma, pancreatitis, and thyroid disease.  Medications include vitamin D, Nexium, multivitamin, hydrocodone, and MiraLax.  Labs include glucose of 107.  Albumin 2.3, albumin 10.4 on October 4.  Height: 63 inches. Weight: 198.6 pounds October 8. Usual body weight: 215 pounds. BMI: 35.19.  Patient complains of poor appetite and nausea.  She denies diarrhea, or constipation.  She denies chewing and swallowing problems.  She does complain of reflux and increased pain.  She reports she does take pain medication, but has not taken anything for nausea.  She states she is unable to eat.  Patient verbalizes that she thinks she is "dehydrated".  During nutrition consult, patient verbalizes "I think I'm going to be sick."  Patient proceeded to spit up into garbage can. Medical team notified.  Nutrition diagnosis: Unintended weight loss related to inadequate oral intake as evidenced by 8 percent weight loss from usual body weight.  Intervention: Patient and family were educated on strategies for eating with nausea.  Patient was encouraged to consume small, frequent meals and snacks throughout the day.  She was educated on protein sources.  Patient was encouraged to discuss nausea with her physician as well as pain control.  Also encouraged patient to tell physician about reflux.  I provided patient with fact sheets, samples of chocolate boost plus and coupons.  Contact information given.  Nursing and on-call M.D. were notified regarding patient's nausea vomiting and responded to patient's symptoms.  Monitoring, evaluation, goals: Patient will tolerate increased oral intake with improved nausea and pain control.  She will consume chocolate boost plus once nausea is controlled for goal of weight maintenance.  Next visit: Patient will contact  me for questions or concerns.

## 2013-07-18 NOTE — Patient Instructions (Signed)

## 2013-07-18 NOTE — Progress Notes (Signed)
This office note has been dictated.

## 2013-07-19 ENCOUNTER — Ambulatory Visit
Admission: RE | Admit: 2013-07-19 | Discharge: 2013-07-19 | Disposition: A | Discharge status: Home / Self Care | Payer: Medicare Other | Source: Ambulatory Visit | Attending: Radiation Oncology | Admitting: Radiation Oncology

## 2013-07-19 ENCOUNTER — Encounter: Payer: Self-pay | Admitting: Radiation Oncology

## 2013-07-19 VITALS — BP 134/69 | HR 65 | Temp 98.2°F | Resp 20 | Ht 63.0 in | Wt 196.3 lb

## 2013-07-19 DIAGNOSIS — C250 Malignant neoplasm of head of pancreas: Secondary | ICD-10-CM | POA: Insufficient documentation

## 2013-07-19 DIAGNOSIS — R918 Other nonspecific abnormal finding of lung field: Secondary | ICD-10-CM | POA: Insufficient documentation

## 2013-07-19 DIAGNOSIS — R17 Unspecified jaundice: Secondary | ICD-10-CM | POA: Insufficient documentation

## 2013-07-19 DIAGNOSIS — C259 Malignant neoplasm of pancreas, unspecified: Secondary | ICD-10-CM

## 2013-07-19 DIAGNOSIS — E785 Hyperlipidemia, unspecified: Secondary | ICD-10-CM | POA: Insufficient documentation

## 2013-07-19 DIAGNOSIS — K219 Gastro-esophageal reflux disease without esophagitis: Secondary | ICD-10-CM | POA: Insufficient documentation

## 2013-07-19 DIAGNOSIS — I700 Atherosclerosis of aorta: Secondary | ICD-10-CM | POA: Insufficient documentation

## 2013-07-19 DIAGNOSIS — R0602 Shortness of breath: Secondary | ICD-10-CM | POA: Insufficient documentation

## 2013-07-19 DIAGNOSIS — E041 Nontoxic single thyroid nodule: Secondary | ICD-10-CM | POA: Insufficient documentation

## 2013-07-19 DIAGNOSIS — R5381 Other malaise: Secondary | ICD-10-CM | POA: Insufficient documentation

## 2013-07-19 DIAGNOSIS — Z79899 Other long term (current) drug therapy: Secondary | ICD-10-CM | POA: Insufficient documentation

## 2013-07-19 DIAGNOSIS — J45909 Unspecified asthma, uncomplicated: Secondary | ICD-10-CM | POA: Insufficient documentation

## 2013-07-19 DIAGNOSIS — I10 Essential (primary) hypertension: Secondary | ICD-10-CM | POA: Insufficient documentation

## 2013-07-19 DIAGNOSIS — K831 Obstruction of bile duct: Secondary | ICD-10-CM | POA: Insufficient documentation

## 2013-07-19 DIAGNOSIS — I77810 Thoracic aortic ectasia: Secondary | ICD-10-CM | POA: Insufficient documentation

## 2013-07-19 DIAGNOSIS — R110 Nausea: Secondary | ICD-10-CM | POA: Insufficient documentation

## 2013-07-19 HISTORY — DX: Malignant neoplasm of pancreas, unspecified: C25.9

## 2013-07-19 NOTE — Progress Notes (Signed)
Please see the Nurse Progress Note in the MD Initial Consult Encounter for this patient. 

## 2013-07-19 NOTE — Progress Notes (Signed)
CC:   Carolyn Pounds, MD  DIAGNOSIS:  Borderline resectable adenocarcinoma of the pancreas.  CURRENT THERAPY:  Patient to start low-dose Xeloda with radiation therapy.  INTERIM HISTORY:  Carolyn [name] comes in for her first office visit.  I saw her in consultation back on October 3rd.  At that point in time, she had been admitted with nausea.  She had weight loss.  She has jaundiced. She was found have a pancreatic mass.  This measured 4.1 x 3.0 x 2.2 cm. This was abutting the portal vein and superior mesenteric vein.  Biopsies were done.  Biopsies showed this to be an adenocarcinoma.  She had a percutaneous biliary drainage tube placed.  She was subsequently discharged.  __________ was only 2.4.  She was seen by Surgical Oncology.  Dr. Donell Beers did not feel that she was resectable, but that she could be resectable if she had a response to neoadjuvant chemoradiation therapy.  She has no appetite.  She just does not feel like eating much.  She has had nausea.  She had some vomiting yesterday.  She got IV fluids over at the main cancer center today.  She comes in to see [place] today.  Again, she says she does not have much of an appetite.  She said that whenever she eats, she hurts in her abdomen. This could be a sign of pancreatic insufficiency.  I gave her some samples of Creon to take.  If this helps her, then we can give her a prescription of 36,000 units to take 2 or 3 times a day and then one with snacks.  I also gave her a prescription for Marinol 2.5 mg p.o. t.i.d.  She is not having diarrhea.  There is no constipation.  She is on blood pressure medications.  She is on some Nexium.  She takes Vicodin for pain.  She takes this 3 or 4 times a day.  The biliary drains get drained twice a day.  She sees Radiation/Oncology tomorrow.  PHYSICAL EXAMINATION:  General:  This is a well developed, well nourished [place] American female in no obvious distress.  Vital  signs: Temperature 97.7, pulse 55, respiratory rate 14, blood pressure 129/45, weight is 196 [name].  Head and Neck:  Normocephalic, atraumatic skull. There are no ocular or oral lesions.  There are no palpable cervical or supraclavicular lymph nodes.  Lungs:  Clear bilaterally.  Cardiac: Regular rate and rhythm with a normal S1 and S2.  There are no murmurs, rubs or bruits.  Abdomen:  Soft.  She has good bowel sounds.  There is no fluid wave.  There is no palpable abdominal mass.  There is no palpable hepatosplenomegaly.  Extremities:  Show no clubbing, cyanosis or edema.  She has good range motion of her joints.  Skin:  Shows some slightly dry skin.  Neurological:  Shows no focal neurological deficits.  LABORATORY STUDIES:  White cell count is 17.8, hemoglobin 11.1, hematocrit 33.7, platelet count 443. Bilirubin is 2.4.  IMPRESSION:  Carolyn [name] is a 77 year old African American female.  She has borderline resectable pancreatic cancer.  One would have to think that she should do fairly well is we can just get neoadjuvant chemoradiation therapy into her.  Hopefully, the antiemetics, the Marinol and Creon, will help with her appetite.  I told her granddaughter to give her Gatorade to take several times a day.  This might help a little bit.  Of note, back when I saw her on October 3rd, her  prealbumin was only 10.4.  We will go ahead and plan to get her started on treatment hopefully next week.  I had some samples of the Xeloda.  I told her to take three pills in the morning.  I realize that this is not a full dose but yet, I still think she would be able to tolerate full/regular Xeloda dose with radiation. I want to try to start her on a dose that I think she could tolerate.  We will plan to get her back in three weeks.  If we find that she is tolerating treatment well, then we can try to titrate her Xeloda dose up.  I spent about 45 minutes with she and her granddaughter.  We  got through a lot of issues.  Hopefully, we will improve her quality of life and her performance status.    ______________________________ Josph Macho, M.D. PRE/MEDQ  D:  07/18/2013  T:  07/19/2013  Job:  [id number]

## 2013-07-19 NOTE — Progress Notes (Signed)
Radiation Oncology         [phone] ________________________________  Name: Carolyn [name] MRN: [id number]  Date: 07/19/2013  DOB: 11-05-1935  WU:[id number],JYNW Judie Petit, MD  Almond Lint, MD   Arlan Organ, M.D.  REFERRING PHYSICIAN: Almond Lint, MD   DIAGNOSIS: The primary encounter diagnosis was Malignant neoplasm of head of pancreas. A diagnosis of Malignant tumor head pancreas was also pertinent to this visit.   HISTORY OF PRESENT ILLNESS::Carolyn [name] is a 77 y.o. female who is seen for an initial consultation visit. The patient has had a history of some abdominal pain as well as a 20 pound weight loss. The patient sought medical attention and was found to have jaundice as well. This led to a CT scan of the abdomen and pelvis. A 4.1 x 3.8 x 3.2 cm mass was found within the pancreatic head/uncinate process region which was highly suspicious for pancreatic cancer. Additional imaging subsequently included a CT scan of the chest which did not reveal any evidence of metastatic disease.  The patient underwent an endoscopic ultrasound which demonstrated a ill-defined hypoechoic mass measuring 25 mm in the head of the pancreas. This appeared to closely but or possibly superficially invaded the portal/S. MV confluence. No neighboring adenopathy. No involvement of the celiac artery or superior mesenteric vein. Multiple biopsies were obtained and these have returned positive for adenocarcinoma.  The patient has been evaluated by Dr. Donell Beers who felt that the patient was a potentially resectable scan and this is borderline. The patient has also seen Dr. in her for who discussed possible neoadjuvant chemoradiotherapy with the patient and he has prescribed Xeloda medication for her.   PREVIOUS RADIATION THERAPY: No   PAST MEDICAL HISTORY:  has a past medical history of Hypertension; Hyperlipidemia; GERD (gastroesophageal reflux disease); Asthma; Obstructive jaundice (06/2013); Pancreatitis; Arthritis;  Cancer; Heart murmur; Thyroid disease; and Pancreatic cancer (07/05/13).     PAST SURGICAL HISTORY: Past Surgical History  Procedure Laterality Date  . Neck surgery  2005/2007  . Hand surgery  2010  . Cataract extraction    . Abdominal hysterectomy    . Eus N/A 07/05/2013    Procedure: ESOPHAGEAL ENDOSCOPIC ULTRASOUND (EUS) RADIAL;  Surgeon: Willis Modena, MD;  Location: [facility];  Service: Endoscopy;  Laterality: N/A;  . Ercp N/A 07/05/2013    Procedure: ENDOSCOPIC RETROGRADE CHOLANGIOPANCREATOGRAPHY (ERCP);  Surgeon: Willis Modena, MD;  Location: [facility];  Service: Endoscopy;  Laterality: N/A;  . Buttock cyst removal    . Cholecystectomy       FAMILY HISTORY: family history includes Cancer in her daughter.   SOCIAL HISTORY:  reports that she has quit smoking. Her smoking use included Cigarettes. She has a 20 pack-year smoking history. She does not have any smokeless tobacco history on file. She reports that she drinks about 0.6 ounces of alcohol per week. She reports that she does not use illicit drugs.   ALLERGIES: Other   MEDICATIONS:  Current Outpatient Prescriptions  Medication Sig Dispense Refill  . cholecalciferol (VITAMIN D) 1000 UNITS tablet Take 1,000 Units by mouth daily.      . diphenhydrAMINE (BENADRYL) 25 mg capsule Take 25 mg by mouth daily as needed for itching.      . docusate sodium 100 MG CAPS Take 100 mg by mouth 2 (two) times daily.  10 capsule  0  . esomeprazole (NEXIUM) 40 MG capsule Take 40 mg by mouth daily before breakfast.      . feeding supplement (ENSURE COMPLETE) LIQD  Take 237 mLs by mouth 2 (two) times daily between meals as needed (Please offer to pt if eating <50% of meals).      Marland Kitchen HYDROcodone-acetaminophen (NORCO/VICODIN) 5-325 MG per tablet Take 1 tablet by mouth every 6 (six) hours as needed for pain.      . Multiple Vitamin (MULTIVITAMIN WITH MINERALS) TABS tablet Take 1 tablet by mouth daily.      Marland Kitchen dronabinol (MARINOL) 2.5 MG  capsule Take 1 pill 3 times a day.  60 capsule  2  . metoCLOPramide (REGLAN) 10 MG tablet Take 1 tablet (10 mg total) by mouth 4 (four) times daily.  30 tablet  0  . ondansetron (ZOFRAN) 8 MG tablet Take 1 tablet (8 mg total) by mouth every 8 (eight) hours as needed for nausea.  20 tablet  0  . polyethylene glycol (MIRALAX / GLYCOLAX) packet Take 17 g by mouth daily as needed.  14 each  0  . prochlorperazine (COMPAZINE) 25 MG suppository Place 1 suppository (25 mg total) rectally every 12 (twelve) hours as needed for nausea.  12 suppository  0  . verapamil (CALAN-SR) 120 MG CR tablet Take 120 mg by mouth daily.        No current facility-administered medications for this encounter.     REVIEW OF SYSTEMS:  A 15 point review of systems is documented in the electronic medical record. This was obtained by the nursing staff. However, I reviewed this with the patient to discuss relevant findings and make appropriate changes.  Pertinent items are noted in HPI.    PHYSICAL EXAM:  height is 5\' 3"  (1.6 m) and weight is 196 lb 4.8 oz (89.041 kg). Her oral temperature is 98.2 F (36.8 C). Her blood pressure is 134/69 and her pulse is 65. Her respiration is 20.   General: Well-developed, in no acute distress, somewhat nauseated this morning and she is picking up a prescription for nausea medicine later today. HEENT: Normocephalic, atraumatic; oral cavity clear Neck: Supple without any lymphadenopathy Cardiovascular: Regular rate and rhythm Respiratory: Clear to auscultation bilaterally GI: Soft, nontender, normal bowel sounds, drain in place Extremities: No edema present Neuro: No focal deficits     LABORATORY DATA:  Lab Results  Component Value Date   WBC 17.8* 07/18/2013   HGB 11.1* 07/18/2013   HCT 33.7* 07/18/2013   MCV 84 07/18/2013   PLT 443* 07/18/2013   Lab Results  Component Value Date   NA 132 07/18/2013   K 4.0 07/18/2013   CL 97* 07/18/2013   CO2 24 07/18/2013   Lab Results   Component Value Date   ALT 134* 07/18/2013   AST 82* 07/18/2013   ALKPHOS 223* 07/18/2013   BILITOT 2.40* 07/18/2013      RADIOGRAPHY: Dg Chest 2 View  07/02/2013   CLINICAL DATA:  Fifteen. Hypertension.  Abdominal pain.  EXAM: CHEST  2 VIEW  COMPARISON:  03/06/2010  FINDINGS: The heart size and mediastinal contours are within normal limits. Both lungs are clear. Atherosclerotic calcification and mild tortuosity of thoracic aorta are stable. The visualized skeletal structures are unremarkable.  IMPRESSION: No active cardiopulmonary disease.   Electronically Signed   By: Myles Rosenthal   On: 07/02/2013 12:13   Ct Chest W Contrast  07/07/2013   CLINICAL DATA:  Weakness, shortness of breath. Recently diagnosed pancreatic mass.  EXAM: CT CHEST WITH CONTRAST  TECHNIQUE: Multidetector CT imaging of the chest was performed during intravenous contrast administration.  CONTRAST:  80mL OMNIPAQUE IOHEXOL  300 MG/ML  SOLN  COMPARISON:  Chest radiograph dated 07/02/2013  FINDINGS: Trace right pleural effusion with associated mild dependent atelectasis at the right lung base.  Minimal subpleural nodularity in the anterior right upper lobe measuring up to 4 mm (series 3/ images 18 and 22). These findings are not worrisome for metastatic disease. Mild centrilobular emphysematous changes. No pneumothorax.  Thyroid is enlarged/nodular, better evaluated on prior thyroid ultrasound.  The heart is top-normal in size. No pericardial effusion. Coronary atherosclerosis. Atherosclerotic calcifications of the aortic arch. Ectasia of the descending thoracic aorta measuring up to 3.2 cm (series 2/ image 44).  No suspicious mediastinal, hilar, or axillary lymphadenopathy.  Visualized upper abdomen is notable for a percutaneous biliary drain but is otherwise unchanged from recent CT.  Degenerative changes of the visualized thoracolumbar spine.  IMPRESSION: No findings suspicious for metastatic disease in the chest.  Minimal subpleural  nodularity in the anterior right upper lobe measuring up to 4 mm, likely benign. Given high risk for primary bronchogenic neoplasm, a single follow-up CT chest is suggested in 12 months.  Trace right pleural effusion with associated mild dependent atelectasis.  This recommendation follows the consensus statement: Guidelines for Management of Small Pulmonary Nodules Detected on CT Scans: A Statement from the Fleischner Society as published in Radiology 2005; 237:395-400.   Electronically Signed   By: Charline Bills M.D.   On: 07/07/2013 10:14   Ct Abdomen Pelvis W Contrast  07/02/2013   CLINICAL DATA:  Jaundice. Abdominal pain and vomiting.  Dark urine.  EXAM: CT ABDOMEN AND PELVIS WITH CONTRAST  TECHNIQUE: Multidetector CT imaging of the abdomen and pelvis was performed using the standard protocol following bolus administration of intravenous contrast.  CONTRAST:  80mL OMNIPAQUE IOHEXOL 300 MG/ML  SOLN  COMPARISON:  06/13/2010  FINDINGS: Prior cholecystectomy again noted as well as multiple small hepatic cysts, however there is diffuse biliary and pancreatic ductal dilatation which is new since previous study. A heterogeneous mass is seen involving the pancreatic head and uncinate process which measures 4.1 x 3.8 x 3.2 and is highly suspicious for pancreatic carcinoma.  No evidence of peripancreatic lymphadenopathy or inflammatory changes. No soft tissue masses or lymphadenopathy seen elsewhere within the abdomen or pelvis.  Prior hysterectomy noted. Adnexal regions are unremarkable. Diverticulosis is seen mainly involving the sigmoid colon, however there is no evidence of diverticulitis. Normal appendix is visualized. No evidence of dilated bowel loops. No suspicious bone lesions identified.  IMPRESSION: Diffuse biliary and pancreatic ductal dilatation, with 4 cm mass in the pancreatic head and uncinate process, highly suspicious for pancreatic carcinoma. Consider ERCP or MRCP for further evaluation.  No  evidence of metastatic disease. Stable hepatic cysts.  Diverticulosis. No radiographic evidence of diverticulitis.   Electronically Signed   By: Myles Rosenthal   On: 07/02/2013 14:32   Ir Biliary Drain Catheter Placement  07/06/2013   CLINICAL DATA:  Pancreatic head mass  EXAM: PERCUTANEOUS TRANSHEPATIC CHOLANGIOGRAM; IR ULTRASOUND GUIDANCE TISSUE ABLATION; NEPHROSTOMY REMOVAL  MEDICATIONS AND MEDICAL HISTORY: Versed 4.0 mg, Fentanyl 2 1 mcg.  Additional Medications: Zosyn.  ANESTHESIA/SEDATION: Moderate sedation time: 20 minutes  CONTRAST:  50 cc Omnipaque 300  FLUOROSCOPY TIME:  5 min and 56 seconds.  PROCEDURE: The procedure, risks, benefits, and alternatives were explained to the patient. Questions regarding the procedure were encouraged and answered. The patient understands and consents to the procedure.  The epigastrium was prepped with Betadine in a sterile fashion, and a sterile drape was applied covering the  operative field. A sterile gown and sterile gloves were used for the procedure.  Under sonographic guidance, a 22 gauge Chiba needle was inserted into a peripheral biliary duct within the lateral segment of the left lobe. After aspirating bile, contrast was injected opacifying the biliary tree. The needle was removed over a 018 wire which was upsized to the 3 J included in the Accustick set. A copy catheter was advanced over the J-wire was exchanged for a Glidewire. The Kumpe be was advanced over the glidewire into the duodenum across the common bile duct obstruction. A copy was exchanged for a 10 dilator then a 10 [place] biliary drain. This was looped in the duodenum, string fixed common and sewn to the skin. Contrast was injected.  COMPLICATIONS: None  FINDINGS: Imaging documents needle access into the biliary tree within the lateral segment of the left lobe of the liver. Subsequent images demonstrate placement of a left internal external biliary drain with its tip coiled in the duodenum.  IMPRESSION:  Successful left 10 French internal external biliary drainage. This will be capped after 24-48 hr. She will follow-up with general surgery regarding consultation of resection.   Electronically Signed   By: Maryclare Bean M.D.   On: 07/06/2013 14:15   Ir Ptc  07/06/2013   CLINICAL DATA:  Pancreatic head mass  EXAM: PERCUTANEOUS TRANSHEPATIC CHOLANGIOGRAM; IR ULTRASOUND GUIDANCE TISSUE ABLATION; NEPHROSTOMY REMOVAL  MEDICATIONS AND MEDICAL HISTORY: Versed 4.0 mg, Fentanyl 2 1 mcg.  Additional Medications: Zosyn.  ANESTHESIA/SEDATION: Moderate sedation time: 20 minutes  CONTRAST:  50 cc Omnipaque 300  FLUOROSCOPY TIME:  5 min and 56 seconds.  PROCEDURE: The procedure, risks, benefits, and alternatives were explained to the patient. Questions regarding the procedure were encouraged and answered. The patient understands and consents to the procedure.  The epigastrium was prepped with Betadine in a sterile fashion, and a sterile drape was applied covering the operative field. A sterile gown and sterile gloves were used for the procedure.  Under sonographic guidance, a 22 gauge Chiba needle was inserted into a peripheral biliary duct within the lateral segment of the left lobe. After aspirating bile, contrast was injected opacifying the biliary tree. The needle was removed over a 018 wire which was upsized to the 3 J included in the Accustick set. A copy catheter was advanced over the J-wire was exchanged for a Glidewire. The Kumpe be was advanced over the glidewire into the duodenum across the common bile duct obstruction. A copy was exchanged for a 10 dilator then a 10 [place] biliary drain. This was looped in the duodenum, string fixed common and sewn to the skin. Contrast was injected.  COMPLICATIONS: None  FINDINGS: Imaging documents needle access into the biliary tree within the lateral segment of the left lobe of the liver. Subsequent images demonstrate placement of a left internal external biliary drain with its tip  coiled in the duodenum.  IMPRESSION: Successful left 10 French internal external biliary drainage. This will be capped after 24-48 hr. She will follow-up with general surgery regarding consultation of resection.   Electronically Signed   By: Maryclare Bean M.D.   On: 07/06/2013 14:15   Ir US Guide Bx Asp/drain  07/06/2013   CLINICAL DATA:  Pancreatic head mass  EXAM: PERCUTANEOUS TRANSHEPATIC CHOLANGIOGRAM; IR ULTRASOUND GUIDANCE TISSUE ABLATION; NEPHROSTOMY REMOVAL  MEDICATIONS AND MEDICAL HISTORY: Versed 4.0 mg, Fentanyl 2 1 mcg.  Additional Medications: Zosyn.  ANESTHESIA/SEDATION: Moderate sedation time: 20 minutes  CONTRAST:  50 cc Omnipaque 300  FLUOROSCOPY TIME:  5 min and 56 seconds.  PROCEDURE: The procedure, risks, benefits, and alternatives were explained to the patient. Questions regarding the procedure were encouraged and answered. The patient understands and consents to the procedure.  The epigastrium was prepped with Betadine in a sterile fashion, and a sterile drape was applied covering the operative field. A sterile gown and sterile gloves were used for the procedure.  Under sonographic guidance, a 22 gauge Chiba needle was inserted into a peripheral biliary duct within the lateral segment of the left lobe. After aspirating bile, contrast was injected opacifying the biliary tree. The needle was removed over a 018 wire which was upsized to the 3 J included in the Accustick set. A copy catheter was advanced over the J-wire was exchanged for a Glidewire. The Kumpe be was advanced over the glidewire into the duodenum across the common bile duct obstruction. A copy was exchanged for a 10 dilator then a 10 [place] biliary drain. This was looped in the duodenum, string fixed common and sewn to the skin. Contrast was injected.  COMPLICATIONS: None  FINDINGS: Imaging documents needle access into the biliary tree within the lateral segment of the left lobe of the liver. Subsequent images demonstrate placement of  a left internal external biliary drain with its tip coiled in the duodenum.  IMPRESSION: Successful left 10 French internal external biliary drainage. This will be capped after 24-48 hr. She will follow-up with general surgery regarding consultation of resection.   Electronically Signed   By: Maryclare Bean M.D.   On: 07/06/2013 14:15   Dg C-arm 1-60 Min-no Report  07/05/2013   CLINICAL DATA: Bile duct obstrustion, pancreatic ca   C-ARM 1-60 MINUTES  Fluoroscopy was utilized by the requesting physician.  No radiographic  interpretation.    Dg C-arm 61-120 Min-no Report  07/04/2013   CLINICAL DATA: pancreatic mass   C-ARM 61-120 MINUTES  Fluoroscopy was utilized by the requesting physician.  No radiographic  interpretation.        IMPRESSION:   The patient is an appropriate candidate for a course of neoadjuvant chemoradiotherapy for her diagnosis of pancreatic cancer. This would consist of 5-1/2 weeks of daily radiation treatment and this would be given concurrently with chemotherapy. The patient therefore we'll need to proceed with simulation such that we can proceed with treatment planning. The target for radiation would consist of the tumor in addition to any surrounding high risk nodal volumes.  I discussed this plan with the patient, including the rationale for radiotherapy in this setting for local control and to try to make surgical resection more feasible. We did discuss the possible side effects and risks of treatment including such possible issues as fatigue, nausea/decreased appetite, loose stools/ diarrhea, irritation of the local area or damage to nearby normal structures. All of the patient's questions were answered.  At the end of this discussion, the patient indicated that she wished to proceed with this treatment recommendation that was outlined for her. Therefore, she will be scheduled for a simulation and we will begin the treatment planning process.    PLAN: The patient will be  scheduled for a simulation in the near future such that we can proceed with treatment planning. He saw the nutritionist yesterday.     I spent 60 minutes face to face with the patient and more than 50% of that time was spent in counseling and/or coordination of care.    ________________________________   Radene Gunning, MD, PhD

## 2013-07-20 ENCOUNTER — Telehealth: Payer: Self-pay | Admitting: *Deleted

## 2013-07-20 ENCOUNTER — Ambulatory Visit (HOSPITAL_COMMUNITY)
Admission: RE | Admit: 2013-07-20 | Discharge: 2013-07-20 | Disposition: A | Discharge status: Home / Self Care | Payer: Medicare Other | Source: Ambulatory Visit | Attending: Internal Medicine | Admitting: Internal Medicine

## 2013-07-20 ENCOUNTER — Other Ambulatory Visit (HOSPITAL_COMMUNITY): Payer: Self-pay | Admitting: Internal Medicine

## 2013-07-20 ENCOUNTER — Other Ambulatory Visit: Payer: Self-pay | Admitting: Radiation Oncology

## 2013-07-20 DIAGNOSIS — K2289 Other specified disease of esophagus: Secondary | ICD-10-CM | POA: Insufficient documentation

## 2013-07-20 DIAGNOSIS — R633 Feeding difficulties, unspecified: Secondary | ICD-10-CM | POA: Insufficient documentation

## 2013-07-20 DIAGNOSIS — M4120 Other idiopathic scoliosis, site unspecified: Secondary | ICD-10-CM | POA: Insufficient documentation

## 2013-07-20 DIAGNOSIS — K219 Gastro-esophageal reflux disease without esophagitis: Secondary | ICD-10-CM | POA: Insufficient documentation

## 2013-07-20 DIAGNOSIS — C259 Malignant neoplasm of pancreas, unspecified: Secondary | ICD-10-CM

## 2013-07-20 DIAGNOSIS — K228 Other specified diseases of esophagus: Secondary | ICD-10-CM | POA: Insufficient documentation

## 2013-07-20 DIAGNOSIS — Z9089 Acquired absence of other organs: Secondary | ICD-10-CM | POA: Insufficient documentation

## 2013-07-20 NOTE — Telephone Encounter (Signed)
Called patient at home, to conform her ct simulation appt tomorrow at 1pm, to arrive at 1245pm, no IV start , bun/cr too high per MD, patient didn't realize she was coming tomorrow to the cancer center rad/onc dept, but will be here she stated,thnaked this Rn for the call 3:46 PM

## 2013-07-21 ENCOUNTER — Ambulatory Visit
Admission: RE | Admit: 2013-07-21 | Discharge: 2013-07-21 | Disposition: A | Discharge status: Home / Self Care | Payer: Medicare Other | Source: Ambulatory Visit | Attending: Radiation Oncology | Admitting: Radiation Oncology

## 2013-07-21 ENCOUNTER — Encounter: Payer: Self-pay | Admitting: Radiation Oncology

## 2013-07-21 ENCOUNTER — Ambulatory Visit: Admission: RE | Admit: 2013-07-21 | Payer: Medicare Other | Source: Ambulatory Visit

## 2013-07-21 ENCOUNTER — Telehealth: Payer: Self-pay | Admitting: Hematology & Oncology

## 2013-07-21 VITALS — BP 136/47 | HR 100 | Temp 98.1°F | Resp 20

## 2013-07-21 DIAGNOSIS — R5381 Other malaise: Secondary | ICD-10-CM | POA: Insufficient documentation

## 2013-07-21 DIAGNOSIS — Z510 Encounter for antineoplastic radiation therapy: Secondary | ICD-10-CM | POA: Insufficient documentation

## 2013-07-21 DIAGNOSIS — F411 Generalized anxiety disorder: Secondary | ICD-10-CM | POA: Insufficient documentation

## 2013-07-21 DIAGNOSIS — Z79899 Other long term (current) drug therapy: Secondary | ICD-10-CM | POA: Insufficient documentation

## 2013-07-21 DIAGNOSIS — C250 Malignant neoplasm of head of pancreas: Secondary | ICD-10-CM

## 2013-07-21 DIAGNOSIS — C259 Malignant neoplasm of pancreas, unspecified: Secondary | ICD-10-CM | POA: Insufficient documentation

## 2013-07-21 NOTE — Progress Notes (Signed)
Patient in ct simulation , called back, patient nauseated, holding emesis basin, patient didn't remember me calling her yesterday afternoon , to come 15 minute before her 1pm ct sim, and came early, 100% room air sats, asked if patient took her nausea medication before coming today, "yes< not diaphoertic, skin warm,dry, rr=even, unlabored, vitals 136/47,p=100,rr=20, t=98.1, gave ststus to Dr.moody, he was paged to come se patient in ct simulation 1:20 PM

## 2013-07-21 NOTE — Telephone Encounter (Signed)
PE pt - pof sent to scheduling pool 10/14 deleted. Pt already on schedule in [facility] for 11/4.

## 2013-07-21 NOTE — Progress Notes (Signed)
  Radiation Oncology         [phone] ________________________________  Name: Carolyn [name] MRN: [id number]  Date: 07/21/2013  DOB: 08-14-1936  RESPIRATORY MOTION MANAGEMENT SIMULATION  NARRATIVE:  In order to account for effect of respiratory motion on target structures and other organs in the planning and delivery of radiotherapy, this patient underwent respiratory motion management simulation.  To accomplish this, when the patient was brought to the CT simulation planning suite, 4D respiratoy motion management CT images were obtained.  The CT images were loaded into the planning software.  Then, using a variety of tools including Cine, MIP, and standard views, the target volume and planning target volumes (PTV) were delineated.  Avoidance structures were contoured.  Treatment planning then occurred.  Dose volume histograms were generated and reviewed for each of the requested structure.  The resulting plan was carefully reviewed and approved today.  ------------------------------------------------  Radene Gunning, MD, PhD

## 2013-07-21 NOTE — Progress Notes (Signed)
  Radiation Oncology         [phone] ________________________________  Name: Carolyn [name] MRN: [id number]  Date: 07/21/2013  DOB: June 02, 1936  SIMULATION AND TREATMENT PLANNING NOTE   CONSENT VERIFIED: yes   SET UP: Patient is set-up supine   IMMOBILIZATION: The following immobilization is used: alpha-cradle. This complex treatment device will be used on a daily basis during the patient's treatment.   Diagnosis: Pancreatic cancer   NARRATIVE: The patient was brought to the CT Simulation planning suite. Identity was confirmed. All relevant records and images related to the planned course of therapy were reviewed. Then, the patient was positioned in a stable reproducible clinical set-up for radiation therapy using a customized Vac-lock bag. Skin markings were placed. A planning CT scan was obtained as well as a 4D CT with respiratory monitoring. The CT images were loaded into the planning software where the target and avoidance structures were contoured.The radiation prescription was entered and confirmed.   The patient will receive 50.4 Gy in 28 fractions.  A higher dose [id number] will be treated with a simultaneous integrated boost technique also in 28 fractions.  Daily image guidance is ordered, and this will be used on a daily basis. This is necessary to ensure accurate and precise localization of the target in addition to accurate alignment of the normal tissue structures in this region.   Treatment planning then occurred.   I have requested : Intensity Modulated Radiotherapy (IMRT) is medically necessary for this case for the following reason: Dose homogeneity; the target is in close proximity to critical normal structures, including the kidneys and the liver. IMRT is thus medically to appropriately treat the patient.   Special treatment procedure  The patient will receive chemotherapy during the course of radiation treatment. The patient may experience increased or overlapping  toxicity due to this combined-modality approach and the patient will be monitored for such problems. This may include extra lab work as necessary. This therefore constitutes a special treatment procedure.    ________________________________  Radene Gunning, MD, PhD

## 2013-07-23 ENCOUNTER — Inpatient Hospital Stay (HOSPITAL_COMMUNITY): Payer: Medicare Other

## 2013-07-23 ENCOUNTER — Inpatient Hospital Stay (HOSPITAL_COMMUNITY)
Admission: EM | Admit: 2013-07-23 | Discharge: 2013-07-28 | DRG: 682 | Disposition: A | Discharge status: Home / Self Care | Payer: Medicare Other | Attending: Internal Medicine | Admitting: Internal Medicine

## 2013-07-23 ENCOUNTER — Encounter (HOSPITAL_COMMUNITY): Payer: Self-pay | Admitting: Emergency Medicine

## 2013-07-23 DIAGNOSIS — K649 Unspecified hemorrhoids: Secondary | ICD-10-CM | POA: Diagnosis present

## 2013-07-23 DIAGNOSIS — E860 Dehydration: Secondary | ICD-10-CM

## 2013-07-23 DIAGNOSIS — E872 Acidosis, unspecified: Secondary | ICD-10-CM | POA: Diagnosis present

## 2013-07-23 DIAGNOSIS — N059 Unspecified nephritic syndrome with unspecified morphologic changes: Secondary | ICD-10-CM | POA: Diagnosis present

## 2013-07-23 DIAGNOSIS — E43 Unspecified severe protein-calorie malnutrition: Secondary | ICD-10-CM | POA: Diagnosis present

## 2013-07-23 DIAGNOSIS — J45909 Unspecified asthma, uncomplicated: Secondary | ICD-10-CM | POA: Diagnosis present

## 2013-07-23 DIAGNOSIS — Z79899 Other long term (current) drug therapy: Secondary | ICD-10-CM

## 2013-07-23 DIAGNOSIS — Z9089 Acquired absence of other organs: Secondary | ICD-10-CM

## 2013-07-23 DIAGNOSIS — T50995A Adverse effect of other drugs, medicaments and biological substances, initial encounter: Secondary | ICD-10-CM | POA: Diagnosis present

## 2013-07-23 DIAGNOSIS — K831 Obstruction of bile duct: Secondary | ICD-10-CM | POA: Diagnosis present

## 2013-07-23 DIAGNOSIS — K219 Gastro-esophageal reflux disease without esophagitis: Secondary | ICD-10-CM | POA: Diagnosis present

## 2013-07-23 DIAGNOSIS — F22 Delusional disorders: Secondary | ICD-10-CM | POA: Diagnosis not present

## 2013-07-23 DIAGNOSIS — E785 Hyperlipidemia, unspecified: Secondary | ICD-10-CM | POA: Diagnosis present

## 2013-07-23 DIAGNOSIS — R627 Adult failure to thrive: Secondary | ICD-10-CM | POA: Diagnosis present

## 2013-07-23 DIAGNOSIS — R011 Cardiac murmur, unspecified: Secondary | ICD-10-CM | POA: Diagnosis present

## 2013-07-23 DIAGNOSIS — K838 Other specified diseases of biliary tract: Secondary | ICD-10-CM | POA: Diagnosis present

## 2013-07-23 DIAGNOSIS — I10 Essential (primary) hypertension: Secondary | ICD-10-CM | POA: Diagnosis present

## 2013-07-23 DIAGNOSIS — N170 Acute kidney failure with tubular necrosis: Principal | ICD-10-CM | POA: Diagnosis present

## 2013-07-23 DIAGNOSIS — R7401 Elevation of levels of liver transaminase levels: Secondary | ICD-10-CM | POA: Diagnosis present

## 2013-07-23 DIAGNOSIS — E8779 Other fluid overload: Secondary | ICD-10-CM | POA: Diagnosis present

## 2013-07-23 DIAGNOSIS — D649 Anemia, unspecified: Secondary | ICD-10-CM | POA: Diagnosis present

## 2013-07-23 DIAGNOSIS — R7402 Elevation of levels of lactic acid dehydrogenase (LDH): Secondary | ICD-10-CM | POA: Diagnosis present

## 2013-07-23 DIAGNOSIS — R630 Anorexia: Secondary | ICD-10-CM | POA: Diagnosis present

## 2013-07-23 DIAGNOSIS — C259 Malignant neoplasm of pancreas, unspecified: Secondary | ICD-10-CM | POA: Diagnosis present

## 2013-07-23 DIAGNOSIS — Z6837 Body mass index (BMI) 37.0-37.9, adult: Secondary | ICD-10-CM

## 2013-07-23 DIAGNOSIS — N179 Acute kidney failure, unspecified: Secondary | ICD-10-CM

## 2013-07-23 DIAGNOSIS — K5900 Constipation, unspecified: Secondary | ICD-10-CM | POA: Diagnosis present

## 2013-07-23 DIAGNOSIS — Z66 Do not resuscitate: Secondary | ICD-10-CM | POA: Diagnosis present

## 2013-07-23 DIAGNOSIS — K859 Acute pancreatitis without necrosis or infection, unspecified: Secondary | ICD-10-CM | POA: Diagnosis present

## 2013-07-23 DIAGNOSIS — R197 Diarrhea, unspecified: Secondary | ICD-10-CM | POA: Diagnosis present

## 2013-07-23 DIAGNOSIS — Z90710 Acquired absence of both cervix and uterus: Secondary | ICD-10-CM

## 2013-07-23 DIAGNOSIS — C250 Malignant neoplasm of head of pancreas: Secondary | ICD-10-CM | POA: Diagnosis present

## 2013-07-23 DIAGNOSIS — M129 Arthropathy, unspecified: Secondary | ICD-10-CM | POA: Diagnosis present

## 2013-07-23 DIAGNOSIS — E871 Hypo-osmolality and hyponatremia: Secondary | ICD-10-CM | POA: Diagnosis present

## 2013-07-23 DIAGNOSIS — Z87891 Personal history of nicotine dependence: Secondary | ICD-10-CM

## 2013-07-23 LAB — URINALYSIS, ROUTINE W REFLEX MICROSCOPIC
Glucose, UA: NEGATIVE mg/dL
Ketones, ur: NEGATIVE mg/dL
Leukocytes, UA: NEGATIVE
Nitrite: NEGATIVE
Protein, ur: 30 mg/dL — AB
Specific Gravity, Urine: 1.031 — ABNORMAL HIGH (ref 1.005–1.030)
pH: 5 (ref 5.0–8.0)

## 2013-07-23 LAB — COMPREHENSIVE METABOLIC PANEL
ALT: 282 U/L — ABNORMAL HIGH (ref 0–35)
Albumin: 3.5 g/dL (ref 3.5–5.2)
Alkaline Phosphatase: 252 U/L — ABNORMAL HIGH (ref 39–117)
BUN: 126 mg/dL — ABNORMAL HIGH (ref 6–23)
CO2: 13 mEq/L — ABNORMAL LOW (ref 19–32)
Calcium: 10.4 mg/dL (ref 8.4–10.5)
Chloride: 89 mEq/L — ABNORMAL LOW (ref 96–112)
Creatinine, Ser: 8.11 mg/dL — ABNORMAL HIGH (ref 0.50–1.10)
GFR calc non Af Amer: 4 mL/min — ABNORMAL LOW (ref >90)
Potassium: 5.5 mEq/L — ABNORMAL HIGH (ref 3.5–5.1)
Total Bilirubin: 2 mg/dL — ABNORMAL HIGH (ref 0.3–1.2)

## 2013-07-23 LAB — URINE MICROSCOPIC-ADD ON

## 2013-07-23 LAB — CBC WITH DIFFERENTIAL/PLATELET
Eosinophils Absolute: 0 10*3/uL (ref 0.0–0.7)
HCT: 35.7 % — ABNORMAL LOW (ref 36.0–46.0)
Hemoglobin: 12.4 g/dL (ref 12.0–15.0)
Lymphocytes Relative: 16 % (ref 12–46)
Lymphs Abs: 2.9 10*3/uL (ref 0.7–4.0)
MCV: 80 fL (ref 78.0–100.0)
Monocytes Absolute: 1.2 10*3/uL — ABNORMAL HIGH (ref 0.1–1.0)
Monocytes Relative: 6 % (ref 3–12)
Neutro Abs: 14.2 10*3/uL — ABNORMAL HIGH (ref 1.7–7.7)
Neutrophils Relative %: 77 % (ref 43–77)
RBC: 4.46 MIL/uL (ref 3.87–5.11)
WBC: 18.4 10*3/uL — ABNORMAL HIGH (ref 4.0–10.5)

## 2013-07-23 LAB — LIPASE, BLOOD: Lipase: 204 U/L — ABNORMAL HIGH (ref 11–59)

## 2013-07-23 MED ORDER — MORPHINE SULFATE 4 MG/ML IJ SOLN
4.0000 mg | Freq: Once | INTRAMUSCULAR | Status: AC
  Administered 2013-07-23: 4 mg via INTRAVENOUS
  Filled 2013-07-23: qty 1

## 2013-07-23 MED ORDER — ACETAMINOPHEN 650 MG RE SUPP: 650.0000 mg | Freq: Four times a day (QID) | RECTAL | Status: DC | PRN

## 2013-07-23 MED ORDER — SODIUM CHLORIDE 0.9 % IV BOLUS (SEPSIS)
1000.0000 mL | Freq: Once | INTRAVENOUS | Status: AC
  Administered 2013-07-23: 1000 mL via INTRAVENOUS

## 2013-07-23 MED ORDER — MORPHINE SULFATE 4 MG/ML IJ SOLN
4.0000 mg | Freq: Once | INTRAMUSCULAR | Status: DC
  Administered 2013-07-23: 4 mg via INTRAVENOUS
  Filled 2013-07-23: qty 1

## 2013-07-23 MED ORDER — MORPHINE SULFATE 4 MG/ML IJ SOLN
4.0000 mg | INTRAMUSCULAR | Status: DC | PRN
  Administered 2013-07-23 – 2013-07-25 (×6): 4 mg via INTRAVENOUS
  Filled 2013-07-23 (×7): qty 1

## 2013-07-23 MED ORDER — ACETAMINOPHEN 325 MG PO TABS
650.0000 mg | ORAL_TABLET | Freq: Four times a day (QID) | ORAL | Status: DC | PRN
  Filled 2013-07-23: qty 2

## 2013-07-23 MED ORDER — ONDANSETRON HCL 4 MG/2ML IJ SOLN
4.0000 mg | Freq: Once | INTRAMUSCULAR | Status: AC
  Administered 2013-07-23: 4 mg via INTRAVENOUS
  Filled 2013-07-23: qty 2

## 2013-07-23 MED ORDER — PANTOPRAZOLE SODIUM 40 MG IV SOLR
40.0000 mg | Freq: Every day | INTRAVENOUS | Status: DC
  Administered 2013-07-23 – 2013-07-25 (×3): 40 mg via INTRAVENOUS
  Filled 2013-07-23 (×4): qty 40

## 2013-07-23 MED ORDER — ONDANSETRON HCL 4 MG/2ML IJ SOLN: 4.0000 mg | Freq: Four times a day (QID) | INTRAMUSCULAR | Status: DC | PRN

## 2013-07-23 MED ORDER — SODIUM CHLORIDE 0.9 % IV SOLN: Freq: Once | INTRAVENOUS | Status: DC

## 2013-07-23 NOTE — ED Notes (Signed)
C/o abdominal pain and vomiting, unable to take po in 2 days

## 2013-07-23 NOTE — ED Provider Notes (Signed)
CSN: [id number]     Arrival date & time 07/23/13  1609 History   First MD Initiated Contact with Patient 07/23/13 1618     Chief Complaint  Patient presents with  . Abdominal Pain  . Emesis   (Consider location/radiation/quality/duration/timing/severity/associated sxs/prior Treatment) HPI Comments: Patient is a 77 year old female who was recently diagnosed with pancreatic cancer 3 weeks ago who presents with acutely worsening abdominal pain since this morning. The pain is located in her epigastrium and does not radiate. The pain is described as aching and severe. The pain started gradually and progressively worsened since the onset. No alleviating/aggravating factors. The patient has tried nothing for symptoms without relief. Associated symptoms include nausea and vomiting. Patient denies fever, headache, diarrhea, chest pain, SOB, dysuria, constipation.    Past Medical History  Diagnosis Date  . Hypertension   . Hyperlipidemia   . GERD (gastroesophageal reflux disease)   . Asthma   . Obstructive jaundice 06/2013  . Pancreatitis   . Arthritis   . Cancer   . Heart murmur   . Thyroid disease   . Pancreatic cancer 07/05/13    pancreatic head=adenocarcinoma   Past Surgical History  Procedure Laterality Date  . Neck surgery  2005/2007  . Hand surgery  2010  . Cataract extraction    . Abdominal hysterectomy    . Eus N/A 07/05/2013    Procedure: ESOPHAGEAL ENDOSCOPIC ULTRASOUND (EUS) RADIAL;  Surgeon: Willis Modena, MD;  Location: [facility];  Service: Endoscopy;  Laterality: N/A;  . Ercp N/A 07/05/2013    Procedure: ENDOSCOPIC RETROGRADE CHOLANGIOPANCREATOGRAPHY (ERCP);  Surgeon: Willis Modena, MD;  Location: [facility];  Service: Endoscopy;  Laterality: N/A;  . Buttock cyst removal    . Cholecystectomy     Family History  Problem Relation Age of Onset  . Cancer Daughter     Breast   History  Substance Use Topics  . Smoking status: Former Smoker -- 1.00 packs/day for 20  years    Types: Cigarettes  . Smokeless tobacco: Not on file     Comment: PACK A WEEK  . Alcohol Use: 0.6 oz/week    1 Glasses of wine per week     Comment: SOMETIMES   OB History   Grav Para Term Preterm Abortions TAB SAB Ect Mult Living                 Review of Systems  Gastrointestinal: Positive for nausea, vomiting and abdominal pain.  All other systems reviewed and are negative.    Allergies  Other  Home Medications   Current Outpatient Rx  Name  Route  Sig  Dispense  Refill  . cholecalciferol (VITAMIN D) 1000 UNITS tablet   Oral   Take 1,000 Units by mouth daily.         . diphenhydrAMINE (BENADRYL) 25 mg capsule   Oral   Take 25 mg by mouth daily as needed for itching.         . docusate sodium 100 MG CAPS   Oral   Take 100 mg by mouth 2 (two) times daily.   10 capsule   0   . dronabinol (MARINOL) 2.5 MG capsule      Take 1 pill 3 times a day.   60 capsule   2   . esomeprazole (NEXIUM) 40 MG capsule   Oral   Take 40 mg by mouth daily before breakfast.         . feeding supplement (ENSURE COMPLETE)  LIQD   Oral   Take 237 mLs by mouth 2 (two) times daily between meals as needed (Please offer to pt if eating <50% of meals).         Marland Kitchen HYDROcodone-acetaminophen (NORCO/VICODIN) 5-325 MG per tablet   Oral   Take 1 tablet by mouth every 6 (six) hours as needed for pain.         Marland Kitchen metoCLOPramide (REGLAN) 10 MG tablet   Oral   Take 1 tablet (10 mg total) by mouth 4 (four) times daily.   30 tablet   0   . Multiple Vitamin (MULTIVITAMIN WITH MINERALS) TABS tablet   Oral   Take 1 tablet by mouth daily.         . ondansetron (ZOFRAN) 8 MG tablet   Oral   Take 1 tablet (8 mg total) by mouth every 8 (eight) hours as needed for nausea.   20 tablet   0   . polyethylene glycol (MIRALAX / GLYCOLAX) packet   Oral   Take 17 g by mouth daily as needed.   14 each   0   . prochlorperazine (COMPAZINE) 25 MG suppository   Rectal   Place 1  suppository (25 mg total) rectally every 12 (twelve) hours as needed for nausea.   12 suppository   0   . verapamil (CALAN-SR) 120 MG CR tablet   Oral   Take 120 mg by mouth daily.           BP 132/39  Pulse 72  Temp(Src) 98.1 F (36.7 C) (Oral)  Resp 18  SpO2 97% Physical Exam  Nursing note and vitals reviewed. Constitutional: She is oriented to person, place, and time. She appears well-developed and well-nourished. No distress.  HENT:  Head: Normocephalic and atraumatic.  Eyes: Conjunctivae and EOM are normal. Pupils are equal, round, and reactive to light.  Neck: Normal range of motion.  Cardiovascular: Normal rate and regular rhythm.  Exam reveals no gallop and no friction rub.   No murmur heard. Pulmonary/Chest: Effort normal and breath sounds normal. She has no wheezes. She has no rales. She exhibits no tenderness.  Abdominal: Soft. She exhibits no distension. There is tenderness. There is no rebound and no guarding.  Epigastric drain intact without site infection. Clear drainage from site. Epigastric tenderness to palpation. No peritoneal signs or other focal tenderness to palpation.   Musculoskeletal: Normal range of motion.  Neurological: She is alert and oriented to person, place, and time. Coordination normal.  Speech is goal-oriented. Moves limbs without ataxia.   Skin: Skin is warm and dry.  Psychiatric: She has a normal mood and affect. Her behavior is normal.    ED Course  Procedures (including critical care time) Labs Review Labs Reviewed  CBC WITH DIFFERENTIAL - Abnormal; Notable for the following:    WBC 18.4 (*)    HCT 35.7 (*)    RDW 17.2 (*)    Platelets 503 (*)    Neutro Abs 14.2 (*)    Monocytes Absolute 1.2 (*)    All other components within normal limits  COMPREHENSIVE METABOLIC PANEL - Abnormal; Notable for the following:    Sodium 124 (*)    Potassium 5.5 (*)    Chloride 89 (*)    CO2 13 (*)    Glucose, Bld 123 (*)    BUN 126 (*)     Creatinine, Ser 8.11 (*)    Total Protein 8.5 (*)    AST 158 (*)  ALT 282 (*)    Alkaline Phosphatase 252 (*)    Total Bilirubin 2.0 (*)    GFR calc non Af Amer 4 (*)    GFR calc Af Amer 5 (*)    All other components within normal limits  LIPASE, BLOOD - Abnormal; Notable for the following:    Lipase 204 (*)    All other components within normal limits  URINE CULTURE  URINALYSIS, ROUTINE W REFLEX MICROSCOPIC   Imaging Review No results found.  EKG Interpretation     Ventricular Rate:  71 PR Interval:  142 QRS Duration: 103 QT Interval:  427 QTC Calculation: 464 R Axis:   67 Text Interpretation:  Sinus rhythm No significant change was found            MDM   1. Acute renal failure   2. Pancreatitis   3. Malignant tumor head pancreas   4. Dehydration     4:20 PM Labs and urinalysis pending. Vitals stable and patient afebrile. Patient will have fluids, morphine and zofran for symptoms.   7:05 PM Labs are grossly abnormal. WBC, lipase elevated. BUN and Creatinine elevated consistent with acute renal failure. I spoke with Dr. Timothy Lasso who will see the patient.   Emilia Beck, PA-C 07/23/13 2305

## 2013-07-23 NOTE — ED Provider Notes (Signed)
Medical screening examination/treatment/procedure(s) were performed by non-physician practitioner and as supervising physician I was immediately available for consultation/collaboration.  Evette Diclemente M Cristol Engdahl, MD 07/23/13 2312 

## 2013-07-23 NOTE — ED Notes (Signed)
She tells me she had pancreatic ca dx in Sep't. Of this year.  She is here today with c/o persistent, profound nausea with occasional emesis x 2 days.  She is in no distress; and is a bit listless.

## 2013-07-23 NOTE — H&P (Signed)
Carolyn [name] is an 77 y.o. female.   PCP:   Gwen Pounds, MD   Chief Complaint:  ARR, N/V, DeH, AFTT  HPI: 17 F c New Dx of Pancreatic Cancer - considered Early stage but 4+ cm and in a bad location.  It caused her last admission for Obstructive Jaundice requiring a Percutaneous drainage catheter.  Since D/c she saw me and I did labs - her LFTs and T Bili were better.  Her Cr started going up.  I adjusted her meds and was to bring her back quickly to follow up on the labs.  She was given IVF's by one of her other doctors.  She has been seen by Dr Cleotilde Neer, Dr Myna Hidalgo, Nutritionist and Rad Onc[facility] [place].  She is set to start Intensity Modulated Radiotherapy (IMRT) and will receive chemotherapy during the course of radiation treatment.  She had a UGI series on 10/16 and it showed No acute finding. Presbyesophagus.  Gastroesophageal reflux.  Large stool burden.  She has had poor PO Intake since D/c and much less in the last 2-3 days.  She continues to have N/V.  She has lost weight.  She has global weakness and is tired.  No Distress.  BP is surprisingly fine.  No confusion.  A little jittery.  Her perc drain had been putting out 500-750 per day and is down to 400-500 and accounts for some of her insensible losses.  She denies CP/SOB or other Sxs x being cold.  Many blankets are on.  No fever.  No Pus or drainage from the perc site.  Some Epigastric pains noted. She came to ED today and noted to be in ARF and will need admission for IVF's.  In ED she received 2 doses MSO4 4 mg Zofran and 2000 L NS bolus then 200 ccs per hr.  I made her NPO x sips/chips and held all oral meds.      Past Medical History:  Past Medical History  Diagnosis Date  . Hypertension   . Hyperlipidemia   . GERD (gastroesophageal reflux disease)   . Asthma   . Obstructive jaundice 06/2013  . Pancreatitis   . Arthritis   . Cancer   . Heart murmur   . Thyroid disease   . Pancreatic cancer 07/05/13    pancreatic  head=adenocarcinoma    Past Surgical History  Procedure Laterality Date  . Neck surgery  2005/2007  . Hand surgery  2010  . Cataract extraction    . Abdominal hysterectomy    . Eus N/A 07/05/2013    Procedure: ESOPHAGEAL ENDOSCOPIC ULTRASOUND (EUS) RADIAL;  Surgeon: Willis Modena, MD;  Location: [facility];  Service: Endoscopy;  Laterality: N/A;  . Ercp N/A 07/05/2013    Procedure: ENDOSCOPIC RETROGRADE CHOLANGIOPANCREATOGRAPHY (ERCP);  Surgeon: Willis Modena, MD;  Location: [facility];  Service: Endoscopy;  Laterality: N/A;  . Buttock cyst removal    . Cholecystectomy        Allergies:   Allergies  Allergen Reactions  . Other Other (See Comments)    Alka seltzer caused severe swelling     Medications: Prior to Admission medications   Medication Sig Start Date End Date Taking? Authorizing Provider  cholecalciferol (VITAMIN D) 1000 UNITS tablet Take 1,000 Units by mouth daily.   Yes Historical Provider, MD  diphenhydrAMINE (BENADRYL) 25 mg capsule Take 25 mg by mouth daily as needed for itching.   Yes Historical Provider, MD  docusate sodium 100 MG CAPS Take 100 mg by  mouth 2 (two) times daily. 07/07/13  Yes Gwen Pounds, MD  dronabinol (MARINOL) 2.5 MG capsule Take 1 pill 3 times a day. 07/18/13  Yes Josph Macho, MD  esomeprazole (NEXIUM) 40 MG capsule Take 40 mg by mouth daily before breakfast.   Yes Historical Provider, MD  feeding supplement (ENSURE COMPLETE) LIQD Take 237 mLs by mouth 2 (two) times daily between meals as needed (Please offer to pt if eating <50% of meals). 07/07/13  Yes Gwen Pounds, MD  HYDROcodone-acetaminophen (NORCO/VICODIN) 5-325 MG per tablet Take 1 tablet by mouth every 6 (six) hours as needed for pain.   Yes Historical Provider, MD  lipase/protease/amylase (CREON-12/PANCREASE) 12000 UNITS CPEP capsule Take 3 capsules by mouth 3 (three) times daily with meals.   Yes Historical Provider, MD  metoCLOPramide (REGLAN) 10 MG tablet Take 1 tablet (10  mg total) by mouth 4 (four) times daily. 07/18/13  Yes Lowella Dell, MD  Multiple Vitamin (MULTIVITAMIN WITH MINERALS) TABS tablet Take 1 tablet by mouth daily. 07/07/13  Yes Gwen Pounds, MD  ondansetron (ZOFRAN) 8 MG tablet Take 1 tablet (8 mg total) by mouth every 8 (eight) hours as needed for nausea. 07/18/13  Yes Lowella Dell, MD  polyethylene glycol (MIRALAX / GLYCOLAX) packet Take 17 g by mouth daily as needed. 07/07/13  Yes Gwen Pounds, MD  prochlorperazine (COMPAZINE) 25 MG suppository Place 1 suppository (25 mg total) rectally every 12 (twelve) hours as needed for nausea. 07/18/13  Yes Lowella Dell, MD      (Not in a hospital admission)   Social History:  reports that she has quit smoking. Her smoking use included Cigarettes. She has a 20 pack-year smoking history. She does not have any smokeless tobacco history on file. She reports that she drinks about 0.6 ounces of alcohol per week. She reports that she does not use illicit drugs.  Family History: Family History  Problem Relation Age of Onset  . Cancer Daughter     Breast    Review of Systems:  Review of Systems - see HPI. Full ROS done. She is ill   Physical Exam:  Blood pressure 132/39, pulse 72, temperature 98.1 F (36.7 C), temperature source Oral, resp. rate 18, SpO2 97.00%. Filed Vitals:   07/23/13 1612  BP: 132/39  Pulse: 72  Temp: 98.1 F (36.7 C)  TempSrc: Oral  Resp: 18  SpO2: 97%   General appearance: Better than anticipated.  Alert Oriented cold. Head: Normocephalic, without obvious abnormality, atraumatic Eyes: conjunctivae/corneas clear. PERRL, EOM's intact.  Nose: Nares normal. Septum midline. Mucosa normal. No drainage or sinus tenderness. TOP - DRY Neck: no adenopathy, no carotid bruit, no JVD and thyroid enlarged Resp: CTA Cardio: Reg GI: soft, non-tender; bowel sounds distant.  Perc Drainage cath RUQ CDI.  No Pus.  No Inflammation. Extremities: extremities normal,  atraumatic, no cyanosis or edema Pulses: 2+ and symmetric Lymph nodes:no cervical lymphadenopathy Neurologic: Alert and oriented X 3, global generalized weakness.   Labs on Admission:   Recent Labs  07/23/13 1700  NA 124*  K 5.5*  CL 89*  CO2 13*  GLUCOSE 123*  BUN 126*  CREATININE 8.11*  CALCIUM 10.4    Recent Labs  07/23/13 1700  AST 158*  ALT 282*  ALKPHOS 252*  BILITOT 2.0*  PROT 8.5*  ALBUMIN 3.5    Recent Labs  07/23/13 1700  LIPASE 204*    Recent Labs  07/23/13 1700  WBC 18.4*  NEUTROABS  14.2*  HGB 12.4  HCT 35.7*  MCV 80.0  PLT 503*   No results found for this basename: CKTOTAL, CKMB, CKMBINDEX, TROPONINI,  in the last 72 hours Lab Results  Component Value Date   INR 0.94 07/02/2013     LAB RESULT POCT:  Results for orders placed during the hospital encounter of 07/23/13  CBC WITH DIFFERENTIAL      Result Value Range   WBC 18.4 (*) 4.0 - 10.5 K/uL   RBC 4.46  3.87 - 5.11 MIL/uL   Hemoglobin 12.4  12.0 - 15.0 g/dL   HCT 16.1 (*) 09.6 - 04.5 %   MCV 80.0  78.0 - 100.0 fL   MCH 27.8  26.0 - 34.0 pg   MCHC 34.7  30.0 - 36.0 g/dL   RDW 40.9 (*) 81.1 - 91.4 %   Platelets 503 (*) 150 - 400 K/uL   Neutrophils Relative % 77  43 - 77 %   Neutro Abs 14.2 (*) 1.7 - 7.7 K/uL   Lymphocytes Relative 16  12 - 46 %   Lymphs Abs 2.9  0.7 - 4.0 K/uL   Monocytes Relative 6  3 - 12 %   Monocytes Absolute 1.2 (*) 0.1 - 1.0 K/uL   Eosinophils Relative 0  0 - 5 %   Eosinophils Absolute 0.0  0.0 - 0.7 K/uL   Basophils Relative 0  0 - 1 %   Basophils Absolute 0.0  0.0 - 0.1 K/uL  COMPREHENSIVE METABOLIC PANEL      Result Value Range   Sodium 124 (*) 135 - 145 mEq/L   Potassium 5.5 (*) 3.5 - 5.1 mEq/L   Chloride 89 (*) 96 - 112 mEq/L   CO2 13 (*) 19 - 32 mEq/L   Glucose, Bld 123 (*) 70 - 99 mg/dL   BUN 782 (*) 6 - 23 mg/dL   Creatinine, Ser 9.56 (*) 0.50 - 1.10 mg/dL   Calcium 21.3  8.4 - 08.6 mg/dL   Total Protein 8.5 (*) 6.0 - 8.3 g/dL   Albumin  3.5  3.5 - 5.2 g/dL   AST 578 (*) 0 - 37 U/L   ALT 282 (*) 0 - 35 U/L   Alkaline Phosphatase 252 (*) 39 - 117 U/L   Total Bilirubin 2.0 (*) 0.3 - 1.2 mg/dL   GFR calc non Af Amer 4 (*) >90 mL/min   GFR calc Af Amer 5 (*) >90 mL/min  LIPASE, BLOOD      Result Value Range   Lipase 204 (*) 11 - 59 U/L  URINALYSIS, ROUTINE W REFLEX MICROSCOPIC      Result Value Range   Color, Urine AMBER (*) YELLOW   APPearance TURBID (*) CLEAR   Specific Gravity, Urine 1.031 (*) 1.005 - 1.030   pH 5.0  5.0 - 8.0   Glucose, UA NEGATIVE  NEGATIVE mg/dL   Hgb urine dipstick MODERATE (*) NEGATIVE   Bilirubin Urine SMALL (*) NEGATIVE   Ketones, ur NEGATIVE  NEGATIVE mg/dL   Protein, ur 30 (*) NEGATIVE mg/dL   Urobilinogen, UA 0.2  0.0 - 1.0 mg/dL   Nitrite NEGATIVE  NEGATIVE   Leukocytes, UA NEGATIVE  NEGATIVE  URINE MICROSCOPIC-ADD ON      Result Value Range   Squamous Epithelial / LPF RARE  RARE   WBC, UA 0-2  <3 WBC/hpf   RBC / HPF 3-6  <3 RBC/hpf   Bacteria, UA RARE  RARE   Urine-Other AMORPHOUS URATES/PHOSPHATES  Radiological Exams on Admission: No results found.    Orders placed during the hospital encounter of 07/23/13  . EKG 12-LEAD  . EKG 12-LEAD  . EKG 12-LEAD  . EKG 12-LEAD  . EKG 12-LEAD  . EKG 12-LEAD  . EKG 12-LEAD  . EKG 12-LEAD   EKG - NSR - No issues.  Assessment/Plan Principal Problem:   Acute renal failure Active Problems:   Essential hypertension   Obstructive jaundice   Acute pancreatitis   Protein-calorie malnutrition, severe   Pancreatic cancer   Malignant tumor head pancreas  ARF - IVFs and monitor Cr and Electrolytes.  No Acute HD needs despite Acidosis/Electrolyte issues etc.  She is not Vol Overloaded and this seems pre-renal.  This was discussed with pt.  If needs arise I would be in favor of short term HD - Not long term.  K is only 5.5 currently - No EKG Changes.  Will check CXR.  Will get Dr Myna Hidalgo, gen Surgery, and IR to see pt in House.   We will let Dr Juliene Pina know of her admission.  ?if GI needed.  Hyponatremia from the renal failure.  Ob Jaundice c Perc drain in place - Will check Non-Contrast CT and get IR to look at site.  Still with excellent output Transaminitis c AST/ALT 150-280.  Follow labs. Current T Bili 2.0  Leukocytosis - Stable and no evidence of underlying infection.  Borderline resectable adenocarcinoma of the pancreas - She is set to start Intensity Modulated Radiotherapy (IMRT) and will receive chemotherapy during the course of radiation treatment.  She already started low dose Xeloda.  The ultimate goal is to get her to shrink the tumor, have her functional and then work on Resection/Whipple.  I have brought up DNR today for the first time.  She and her family were quiet so I will give them time to think about this.  If we cannot make any progress quickly I am concerned that we will have no choice but to entertain Hospice Palliative Care.  Severe Protein Calorie malnutrition - She is in desperate need of Calories.  Consider J Tube?  AFTT - Worsening.  Ab Pain/N/V - Has been using Antiemetics at home.  Will use prns here.  DVT Proph - Squeezers.  Hold Lovenox c the renal failure.  Pancreatitis - Fluids.  Dondra Spry her daughter: [phone]  HTN - BP fine - Hold all meds.  GERD - IV.  PPI. Nexium at home   Jene Oravec M 07/23/2013, 7:57 PM

## 2013-07-24 ENCOUNTER — Encounter (HOSPITAL_COMMUNITY): Payer: Self-pay | Admitting: *Deleted

## 2013-07-24 LAB — PROTIME-INR: INR: 1.24 (ref 0.00–1.49)

## 2013-07-24 LAB — URINE CULTURE
Colony Count: NO GROWTH
Culture: NO GROWTH

## 2013-07-24 LAB — TYPE AND SCREEN: ABO/RH(D): O NEG

## 2013-07-24 LAB — BASIC METABOLIC PANEL
BUN: 110 mg/dL — ABNORMAL HIGH (ref 6–23)
CO2: 13 mEq/L — ABNORMAL LOW (ref 19–32)
Chloride: 101 mEq/L (ref 96–112)
GFR calc Af Amer: 6 mL/min — ABNORMAL LOW (ref >90)
Potassium: 5.2 mEq/L — ABNORMAL HIGH (ref 3.5–5.1)

## 2013-07-24 LAB — COMPREHENSIVE METABOLIC PANEL
AST: 112 U/L — ABNORMAL HIGH (ref 0–37)
Albumin: 2.5 g/dL — ABNORMAL LOW (ref 3.5–5.2)
BUN: 124 mg/dL — ABNORMAL HIGH (ref 6–23)
CO2: 13 mEq/L — ABNORMAL LOW (ref 19–32)
Calcium: 8.7 mg/dL (ref 8.4–10.5)
Creatinine, Ser: 8.01 mg/dL — ABNORMAL HIGH (ref 0.50–1.10)
Total Protein: 6.3 g/dL (ref 6.0–8.3)

## 2013-07-24 LAB — CBC
HCT: 27.5 % — ABNORMAL LOW (ref 36.0–46.0)
HCT: 29.1 % — ABNORMAL LOW (ref 36.0–46.0)
Hemoglobin: 9.2 g/dL — ABNORMAL LOW (ref 12.0–15.0)
Hemoglobin: 9.8 g/dL — ABNORMAL LOW (ref 12.0–15.0)
MCV: 81.6 fL (ref 78.0–100.0)
Platelets: 378 10*3/uL (ref 150–400)
Platelets: 404 10*3/uL — ABNORMAL HIGH (ref 150–400)
RBC: 3.37 MIL/uL — ABNORMAL LOW (ref 3.87–5.11)
RBC: 3.53 MIL/uL — ABNORMAL LOW (ref 3.87–5.11)
RDW: 17.9 % — ABNORMAL HIGH (ref 11.5–15.5)
WBC: 15.8 10*3/uL — ABNORMAL HIGH (ref 4.0–10.5)
WBC: 15.3 10*3/uL — ABNORMAL HIGH (ref 4.0–10.5)

## 2013-07-24 LAB — LIPASE, BLOOD: Lipase: 192 U/L — ABNORMAL HIGH (ref 11–59)

## 2013-07-24 MED ORDER — BOOST PLUS PO LIQD
237.0000 mL | Freq: Two times a day (BID) | ORAL | Status: DC
  Administered 2013-07-24 – 2013-07-27 (×5): 237 mL via ORAL
  Filled 2013-07-24 (×9): qty 237

## 2013-07-24 MED ORDER — SODIUM CHLORIDE 0.9 % IV SOLN
Freq: Once | INTRAVENOUS | Status: AC
  Administered 2013-07-24: 08:00:00 via INTRAVENOUS

## 2013-07-24 MED ORDER — SODIUM CHLORIDE 0.9 % IV SOLN
INTRAVENOUS | Status: AC
  Administered 2013-07-24 – 2013-07-26 (×8): via INTRAVENOUS

## 2013-07-24 MED ORDER — ENSURE PUDDING PO PUDG
1.0000 | ORAL | Status: DC
  Filled 2013-07-24 (×2): qty 1

## 2013-07-24 NOTE — Progress Notes (Signed)
Subjective: Not hungry; no appetite. Abdominal pain has improved.  Objective: Vital signs in last 24 hours: Temp:  [97.6 F (36.4 C)-98.1 F (36.7 C)] 97.6 F (36.4 C) (10/20 1338) Pulse Rate:  [67-79] 67 (10/20 1338) Resp:  [18] 18 (10/20 1338) BP: (97-154)/(31-40) 130/34 mmHg (10/20 1338) SpO2:  [97 %-100 %] 100 % (10/20 1338) Weight:  [87.7 kg (193 lb 5.5 oz)-89.9 kg (198 lb 3.1 oz)] 89.9 kg (198 lb 3.1 oz) (10/20 0549) Weight change:  Last BM Date: 07/08/13  PE: GEN:  More debilitated-appearing than when I last saw her. ABD:  RUQ percutaneous biliary drain in place; otherwise soft, no significant tenderness; bowel sounds diminished but present  Lab Results: CBC    Component Value Date/Time   WBC 15.8* 07/24/2013 0400   WBC 17.8* 07/18/2013 1302   RBC 3.37* 07/24/2013 0400   HGB 9.2* 07/24/2013 0400   HGB 11.1* 07/18/2013 1302   HCT 27.5* 07/24/2013 0400   HCT 33.7* 07/18/2013 1302   PLT 378 07/24/2013 0400   PLT 443* 07/18/2013 1302   MCV 81.6 07/24/2013 0400   MCV 84 07/18/2013 1302   MCH 27.3 07/24/2013 0400   MCH 27.6 07/18/2013 1302   MCHC 33.5 07/24/2013 0400   MCHC 32.9 07/18/2013 1302   RDW 17.8* 07/24/2013 0400   RDW 17.9* 07/18/2013 1302   LYMPHSABS 2.9 07/23/2013 1700   LYMPHSABS 4.9* 07/18/2013 1302   MONOABS 1.2* 07/23/2013 1700   EOSABS 0.0 07/23/2013 1700   EOSABS 0.2 07/18/2013 1302   BASOSABS 0.0 07/23/2013 1700   BASOSABS 0.0 07/18/2013 1302   Assessment:  1.  Pancreatic cancer, biopsy-proven. 2.  Obstructive jaundice due to #1 above, percutaneous biliary drain in place, with improvement of LFTs. 3.  Failure to thrive with poor appetite and some nausea/vomiting.  No evidence of UGI tract obstruction, based on recent KUB with UGI series.  Biliary obstruction has improved since placement of percutaneous biliary drains.  Plan:  1.  I have had discussion of overall situation with patient.  I have counseled her that the plan is for  chemoradiative therapy with hopeful surgical resection in the future.  However, I have also counseled patient that she is not clinically well enough from nutritive standpoint to undergo much insofar as chemoradiative treatment is concerned. 2.  I have instructed patient that peroral nutrition is by far the best option.  This could be supplemented with liquid peroral hyperalimentation, such as from Boost or Ensure.  Perhaps a nutrition consult and possible calorie count would be helpful in this situation. 3.  If patient is unable to meet her metabolic needs with peroral nutrition, then would consider some sort of enteral feeding tube (PEG with jejunostomy extension, to be done by Interventional Radiology, would be best option) to meet patient's nutritional needs. 4.   Unfortunately, there is nothing else (endoscopically or otherwise) we as GI consultants have to offer in this challenging situation.  5.  Will sign-off; please call with questions.   Carolyn [name] 07/24/2013, 1:41 PM

## 2013-07-24 NOTE — Progress Notes (Signed)
Subjective: Asked to eval Perc biliary drain that was recently placed on 10/2 for obstructive jaundice. Drain is internal/external and bili was trending down with good bilious output at time of previous discharge. She has been readmitted for ARF and IR asked to assess drain to ensure proper function while here. Pt denies pain at site nor any known issues with the drain. Admission CT shows drain in adequate position with good decompression of bile ducts.  Objective: Physical Exam: BP 97/31  Pulse 73  Temp(Src) 97.8 F (36.6 C) (Oral)  Resp 18  Ht 5\' 3"  (1.6 m)  Wt 198 lb 3.1 oz (89.9 kg)  BMI 35.12 kg/m2  SpO2 99% RUQ Perc PTC site is clean, dressing dry, no tenderness. Output is golden bilious. T. Bili has continued to trend down to 1.4   Labs: CBC  Recent Labs  07/23/13 1700 07/24/13 0400  WBC 18.4* 15.8*  HGB 12.4 9.2*  HCT 35.7* 27.5*  PLT 503* 378   BMET  Recent Labs  07/23/13 1700 07/24/13 0400  NA 124* 129*  K 5.5* 4.9  CL 89* 99  CO2 13* 13*  GLUCOSE 123* 80  BUN 126* 124*  CREATININE 8.11* 8.01*  CALCIUM 10.4 8.7   LFT  Recent Labs  07/24/13 0400  PROT 6.3  ALBUMIN 2.5*  AST 112*  ALT 204*  ALKPHOS 169*  BILITOT 1.4*  LIPASE 192*   PT/INR  Recent Labs  07/24/13 0400  LABPROT 15.3*  INR 1.24     Studies/Results: Ct Abdomen Wo Contrast  07/24/2013   CLINICAL DATA:  Abdominal pain. Pancreatitis. Pancreatic carcinoma. Biliary drainage catheter. Elevated white blood count. Renal insufficiency.  EXAM: CT ABDOMEN WITHOUT CONTRAST  TECHNIQUE: Multidetector CT imaging of the abdomen was performed following the standard protocol without IV contrast.  COMPARISON:  CT abdomen 07/02/2013  FINDINGS: Interval placement of a biliary drainage catheter through the left lobe and into the common bile duct and into the duodenum. Bile ducts are now adequately decompressed. Multiple hepatic low-density lesions are stable may represent cysts. Pancreatic head  mass is better seen on the prior study. This is partially obscured by streak artifact from barium in the colon.  Negative for bowel obstruction. No adenopathy. Atherosclerotic aorta. No free fluid. No adenopathy. Lung bases are clear.  IMPRESSION: Biliary drainage catheter in satisfactory position with decompression of the bile ducts.  Multiple liver lesions compatible with cysts.  No evidence of pancreatitis. Pancreatic head mass not well seen on the current study due to artifact from barium and lack of intravenous contrast.   Electronically Signed   By: Marlan Palau M.D.   On: 07/24/2013 00:12   X-ray Chest Pa And Lateral   07/24/2013   CLINICAL DATA:  Shortness of breath. Abdominal pain. Acute pancreatitis. History of pancreatic cancer.  EXAM: CHEST  2 VIEW  COMPARISON:  07/02/2013  FINDINGS: The heart size and mediastinal contours are within normal limits. Both lungs are clear. The visualized skeletal structures are unremarkable. Displacement of the trachea towards the right consistent with thyroid goiter. Postoperative changes in the cervical spine. Calcified and tortuous aorta. Degenerative changes in the spine and shoulders. No significant change since previous study.  IMPRESSION: No active cardiopulmonary disease.   Electronically Signed   By: Burman Nieves M.D.   On: 07/24/2013 00:11    Assessment/Plan: Obstructive jaundice s/p RUQ PTC drain on 10/2 Site clean, good output, decompressed ducts on CT, Bili near normal. No immediate recs for biliary drain at this  time. Could consider capping drain to assess how well she is draining internally. IR will follow.    LOS: 1 day    Brayton El PA-C 07/24/2013 10:08 AM

## 2013-07-24 NOTE — Progress Notes (Signed)
INITIAL NUTRITION ASSESSMENT  DOCUMENTATION CODES Per approved criteria  -Severe malnutrition in the context of chronic illness  Pt meets criteria for SEVERE MALNUTRITION in the context of Chronic Illness as evidenced by 8% weight loss in less than 3 months and estimated energy intake <75% of estimated energy needs for > 1 month.   INTERVENTION: Provide Boost Plus BID Provide Ensure Pudding and Magic Cup once daily each Recommend advancing diet  NUTRITION DIAGNOSIS: Inadequate oral intake related to poor appetite as evidenced by 8% wt loss in less than 3 months  Goal: Pt to meet >/= 90% of their estimated nutrition needs    Monitor:  Diet advancement PO intake Weight Labs  Reason for Assessment: Malnutrition Screening Tool, score of 2  77 y.o. female  Admitting Dx: Acute renal failure  ASSESSMENT: 79 F c New Dx of Pancreatic Cancer - considered Early stage but 4+ cm and in a bad location. It caused her last admission for Obstructive Jaundice requiring a Percutaneous drainage catheter. She has been seen by Dr Cleotilde Neer, Dr Myna Hidalgo, Nutritionist and Rad Onc-Dr Hubbard. She is set to start Intensity Modulated Radiotherapy (IMRT) and will receive chemotherapy during the course of radiation treatment. She has had poor PO Intake since D/c and much less in the last 2-3 days. She continues to have N/V. She has lost weight. She has global weakness and is tired. She came to ED today and noted to be in ARF and will need admission for IVF's.  Pt states she has no appetite, she hasn't eaten much today, and reports eating poorly PTA. Pt complains of some nausea today but, also expresses desire to eat solid food now. Pt currently on full liquid diet. Pt seen by outpatient cancer dietitian who recommended pt eat more frequent snacks and drink a nutritional supplement such as Boost once daily to help pt maintain her weight. RD met with pt during previous admission on 07/03/13- pt had only been eating  25-50% of meals and reported eating primarily saltine crackers for 3 weeks PTA.   Height: Ht Readings from Last 1 Encounters:  07/23/13 5\' 3"  (1.6 m)    Weight: Wt Readings from Last 1 Encounters:  07/24/13 198 lb 3.1 oz (89.9 kg)    Ideal Body Weight: 115 lbs  % Ideal Body Weight: 172%  Wt Readings from Last 10 Encounters:  07/24/13 198 lb 3.1 oz (89.9 kg)  07/19/13 196 lb 4.8 oz (89.041 kg)  07/18/13 196 lb (88.905 kg)  07/12/13 198 lb 9.6 oz (90.084 kg)  07/02/13 187 lb (84.823 kg)  07/02/13 187 lb (84.823 kg)  07/02/13 187 lb (84.823 kg)  07/02/13 187 lb (84.823 kg)  05/04/13 215 lb (97.523 kg)  06/22/12 215 lb 8 oz (97.75 kg)    Usual Body Weight: 215 lbs  % Usual Body Weight: 92%  BMI:  Body mass index is 35.12 kg/(m^2).  Estimated Nutritional Needs: Kcal: 2100-2300 Protein: 100-110 grams Fluid: 2.3-2.5 L/day  Skin: +1 RLE and LLE edema; mid abdominal puncture wound  Diet Order: Full Liquid  EDUCATION NEEDS: -No education needs identified at this time   Intake/Output Summary (Last 24 hours) at 07/24/13 1509 Last data filed at 07/24/13 1404  Gross per 24 hour  Intake 2683.33 ml  Output    495 ml  Net 2188.33 ml    Last BM: PTA  Labs:   Recent Labs Lab 07/18/13 1303 07/23/13 1700 07/24/13 0400  NA 132 124* 129*  K 4.0 5.5* 4.9  CL  97* 89* 99  CO2 24 13* 13*  BUN 73* 126* 124*  CREATININE 2.4* 8.11* 8.01*  CALCIUM 9.5 10.4 8.7  GLUCOSE 112 123* 80    CBG (last 3)  No results found for this basename: GLUCAP,  in the last 72 hours  Scheduled Meds: . pantoprazole (PROTONIX) IV  40 mg Intravenous QHS    Continuous Infusions: . sodium chloride 200 mL/hr at 07/24/13 1404    Past Medical History  Diagnosis Date  . Hypertension   . Hyperlipidemia   . GERD (gastroesophageal reflux disease)   . Asthma   . Obstructive jaundice 06/2013  . Pancreatitis   . Arthritis   . Cancer   . Heart murmur   . Thyroid disease   . Pancreatic  cancer 07/05/13    pancreatic head=adenocarcinoma    Past Surgical History  Procedure Laterality Date  . Neck surgery  2005/2007  . Hand surgery  2010  . Cataract extraction    . Abdominal hysterectomy    . Eus N/A 07/05/2013    Procedure: ESOPHAGEAL ENDOSCOPIC ULTRASOUND (EUS) RADIAL;  Surgeon: Willis Modena, MD;  Location: [facility];  Service: Endoscopy;  Laterality: N/A;  . Ercp N/A 07/05/2013    Procedure: ENDOSCOPIC RETROGRADE CHOLANGIOPANCREATOGRAPHY (ERCP);  Surgeon: Willis Modena, MD;  Location: [facility];  Service: Endoscopy;  Laterality: N/A;  . Buttock cyst removal    . Cholecystectomy      Ian Malkin RD, LDN Inpatient Clinical Dietitian Pager: [phone] After Hours Pager: [phone]

## 2013-07-24 NOTE — Progress Notes (Signed)
Subjective: Admitted last night c Ab pain, ARF, Weakness, AFTT. Cr 8+ No more shivering.  No longer cold. Doing a little better. No Ab pain, CP or SOB  Objective: Vital signs in last 24 hours: Temp:  [97.7 F (36.5 C)-98.1 F (36.7 C)] 97.8 F (36.6 C) (10/20 0549) Pulse Rate:  [72-79] 73 (10/20 0549) Resp:  [18] 18 (10/20 0549) BP: (97-154)/(31-40) 97/31 mmHg (10/20 0549) SpO2:  [97 %-100 %] 99 % (10/20 0549) Weight:  [87.7 kg (193 lb 5.5 oz)-89.9 kg (198 lb 3.1 oz)] 89.9 kg (198 lb 3.1 oz) (10/20 0549) Weight change:     CBG (last 3)  No results found for this basename: GLUCAP,  in the last 72 hours  Intake/Output from previous day:  Intake/Output Summary (Last 24 hours) at 07/24/13 0719 Last data filed at 07/24/13 0300  Gross per 24 hour  Intake      0 ml  Output    345 ml  Net   -345 ml   10/19 0701 - 10/20 0700 In: -  Out: 345 [Urine:50; Drains:175]   Physical Exam  General appearance: Better than expected. A and O Eyes: no scleral icterus Throat: oropharynx moist without erythema Resp: CTA Cardio: Reg c known murmur GI: softer, non-tender; bowel sounds fine.  Perc drainage catheter in place = CDI Extremities: no clubbing, cyanosis or edema.  SCDs on.   Lab Results:  Recent Labs  07/23/13 1700 07/24/13 0400  NA 124* 129*  K 5.5* 4.9  CL 89* 99  CO2 13* 13*  GLUCOSE 123* 80  BUN 126* 124*  CREATININE 8.11* 8.01*  CALCIUM 10.4 8.7     Recent Labs  07/23/13 1700 07/24/13 0400  AST 158* 112*  ALT 282* 204*  ALKPHOS 252* 169*  BILITOT 2.0* 1.4*  PROT 8.5* 6.3  ALBUMIN 3.5 2.5*     Recent Labs  07/23/13 1700 07/24/13 0400  WBC 18.4* 15.8*  NEUTROABS 14.2*  --   HGB 12.4 9.2*  HCT 35.7* 27.5*  MCV 80.0 81.6  PLT 503* 378    Lab Results  Component Value Date   INR 1.24 07/24/2013   INR 0.94 07/02/2013    No results found for this basename: CKTOTAL, CKMB, CKMBINDEX, TROPONINI,  in the last 72 hours  No results found for  this basename: TSH, T4TOTAL, FREET3, T3FREE, THYROIDAB,  in the last 72 hours  No results found for this basename: VITAMINB12, FOLATE, FERRITIN, TIBC, IRON, RETICCTPCT,  in the last 72 hours  Micro Results: No results found for this or any previous visit (from the past 240 hour(s)).   Studies/Results: Ct Abdomen Wo Contrast  07/24/2013   CLINICAL DATA:  Abdominal pain. Pancreatitis. Pancreatic carcinoma. Biliary drainage catheter. Elevated white blood count. Renal insufficiency.  EXAM: CT ABDOMEN WITHOUT CONTRAST  TECHNIQUE: Multidetector CT imaging of the abdomen was performed following the standard protocol without IV contrast.  COMPARISON:  CT abdomen 07/02/2013  FINDINGS: Interval placement of a biliary drainage catheter through the left lobe and into the common bile duct and into the duodenum. Bile ducts are now adequately decompressed. Multiple hepatic low-density lesions are stable may represent cysts. Pancreatic head mass is better seen on the prior study. This is partially obscured by streak artifact from barium in the colon.  Negative for bowel obstruction. No adenopathy. Atherosclerotic aorta. No free fluid. No adenopathy. Lung bases are clear.  IMPRESSION: Biliary drainage catheter in satisfactory position with decompression of the bile ducts.  Multiple liver lesions compatible  with cysts.  No evidence of pancreatitis. Pancreatic head mass not well seen on the current study due to artifact from barium and lack of intravenous contrast.   Electronically Signed   By: Marlan Palau M.D.   On: 07/24/2013 00:12   X-ray Chest Pa And Lateral   07/24/2013   CLINICAL DATA:  Shortness of breath. Abdominal pain. Acute pancreatitis. History of pancreatic cancer.  EXAM: CHEST  2 VIEW  COMPARISON:  07/02/2013  FINDINGS: The heart size and mediastinal contours are within normal limits. Both lungs are clear. The visualized skeletal structures are unremarkable. Displacement of the trachea towards the right  consistent with thyroid goiter. Postoperative changes in the cervical spine. Calcified and tortuous aorta. Degenerative changes in the spine and shoulders. No significant change since previous study.  IMPRESSION: No active cardiopulmonary disease.   Electronically Signed   By: Burman Nieves M.D.   On: 07/24/2013 00:11     Medications: Scheduled: . sodium chloride   Intravenous Once  . pantoprazole (PROTONIX) IV  40 mg Intravenous QHS   Continuous:    Assessment/Plan: Principal Problem:   Acute renal failure Active Problems:   Essential hypertension   Obstructive jaundice   Acute pancreatitis   Protein-calorie malnutrition, severe   Pancreatic cancer   Malignant tumor head pancreas  ARF - The IVF was inadvertantly ordered "once" and not continuous so she has been off IVFs for a little while.  I will give a one Liter bolus followed by 200 cc per hr and recheck labs later this afternoon.  Monitoring Cr and Electrolytes.  Na better. Still acidotic. Cr minimally improved.   No Acute HD needs despite Acidosis/Electrolyte issues etc. She is not Vol Overloaded and this all seems pre-renal. This was discussed with pt. If needs arise I would be in favor of short term HD - Not long term. K is only 4.9 currently and better than 5.5 last night - No EKG Changes. Low threshold to get renal involved but does not need currently as they would just recommend fluids.  CXR. (-)  Will get Dr Myna Hidalgo, gen Surgery, and IR to see pt in House. We will let Dr Mitzi Hansen know of her admission. ?if GI needed.   Hyponatremia from the renal failure already better.   Ob Jaundice c Perc drain in place - Non-Contrast CT looked better than expected c results c/w - Biliary drainage catheter in satisfactory position with decompression of the bile ducts.  Multiple liver lesions compatible with cysts.  No evidence of pancreatitis. Pancreatic head mass not well seen on the current study due to artifact from barium and lack of  intravenous contrast. Will get IR to look at site and make any recs. Still with excellent output via drain.  Transaminitis c AST/ALT 150-280. Follow labs - a little better this am.  Current T Bili 2.0 - 1.4 Leukocytosis - Stable to a little better and no evidence of underlying infection.   Borderline resectable adenocarcinoma of the pancreas - She is set to start Intensity Modulated Radiotherapy (IMRT) and will receive chemotherapy during the course of radiation treatment. She already started low dose Xeloda. The ultimate goal is to get her to shrink the tumor, have her functional and then work on Resection/Whipple. I have brought up DNR last time for the first time. She and her family were quiet so I will give them time to think about this. If we cannot make any progress quickly I am concerned that we will have no  choice but to entertain [facility] Palliative [facility].   Severe Protein Calorie malnutrition - She is in desperate need of Calories. Consider J Tube?   AFTT - Worsening.  Worsening Anemia - Follow CBC for needs of transfusion.  No Obvious bleeding at this time.  Ab Pain/N/V - Antiemetics prn  DVT Proph - Squeezers. Hold Lovenox c the renal failure.   Pancreatitis based on Lipase - CT was (-) - Fluids.   Dondra Spry her daughter: [phone]   HTN - BP fine - Hold all meds.   GERD - IV Protonix - PPI. Nexium at home     LOS: 1 day   Esaul Dorwart M 07/24/2013, 7:19 AM

## 2013-07-25 ENCOUNTER — Encounter (INDEPENDENT_AMBULATORY_CARE_PROVIDER_SITE_OTHER): Payer: Self-pay

## 2013-07-25 DIAGNOSIS — R627 Adult failure to thrive: Secondary | ICD-10-CM

## 2013-07-25 DIAGNOSIS — R630 Anorexia: Secondary | ICD-10-CM

## 2013-07-25 DIAGNOSIS — N179 Acute kidney failure, unspecified: Secondary | ICD-10-CM

## 2013-07-25 DIAGNOSIS — K838 Other specified diseases of biliary tract: Secondary | ICD-10-CM

## 2013-07-25 DIAGNOSIS — D649 Anemia, unspecified: Secondary | ICD-10-CM

## 2013-07-25 DIAGNOSIS — C259 Malignant neoplasm of pancreas, unspecified: Secondary | ICD-10-CM

## 2013-07-25 DIAGNOSIS — E46 Unspecified protein-calorie malnutrition: Secondary | ICD-10-CM

## 2013-07-25 LAB — COMPREHENSIVE METABOLIC PANEL
ALT: 162 U/L — ABNORMAL HIGH (ref 0–35)
AST: 77 U/L — ABNORMAL HIGH (ref 0–37)
Albumin: 2.5 g/dL — ABNORMAL LOW (ref 3.5–5.2)
Alkaline Phosphatase: 184 U/L — ABNORMAL HIGH (ref 39–117)
Chloride: 104 mEq/L (ref 96–112)
Potassium: 5 mEq/L (ref 3.5–5.1)
Sodium: 131 mEq/L — ABNORMAL LOW (ref 135–145)
Total Bilirubin: 1.4 mg/dL — ABNORMAL HIGH (ref 0.3–1.2)

## 2013-07-25 LAB — CBC
HCT: 28.2 % — ABNORMAL LOW (ref 36.0–46.0)
MCHC: 33.3 g/dL (ref 30.0–36.0)
Platelets: 381 10*3/uL (ref 150–400)
RDW: 17.7 % — ABNORMAL HIGH (ref 11.5–15.5)
WBC: 14.1 10*3/uL — ABNORMAL HIGH (ref 4.0–10.5)

## 2013-07-25 MED ORDER — DIPHENHYDRAMINE HCL 50 MG/ML IJ SOLN
25.0000 mg | Freq: Four times a day (QID) | INTRAMUSCULAR | Status: DC | PRN
  Administered 2013-07-25: 50 mg via INTRAVENOUS
  Filled 2013-07-25: qty 1

## 2013-07-25 MED ORDER — DEXTROSE-NACL 5-0.9 % IV SOLN
INTRAVENOUS | Status: AC
  Administered 2013-07-25 – 2013-07-26 (×3): via INTRAVENOUS

## 2013-07-25 MED ORDER — BISACODYL 10 MG RE SUPP
10.0000 mg | Freq: Every day | RECTAL | Status: DC | PRN
  Administered 2013-07-26 – 2013-07-28 (×2): 10 mg via RECTAL
  Filled 2013-07-25 (×2): qty 1

## 2013-07-25 MED ORDER — ENSURE PUDDING PO PUDG
1.0000 | Freq: Three times a day (TID) | ORAL | Status: DC
  Administered 2013-07-25 – 2013-07-26 (×2): 1 via ORAL
  Filled 2013-07-25 (×5): qty 1

## 2013-07-25 MED ORDER — POLYETHYLENE GLYCOL 3350 17 G PO PACK
17.0000 g | PACK | Freq: Every day | ORAL | Status: DC | PRN
  Administered 2013-07-26 – 2013-07-28 (×2): 17 g via ORAL
  Filled 2013-07-25 (×2): qty 1

## 2013-07-25 MED ORDER — VITAMINS A & D EX OINT
TOPICAL_OINTMENT | CUTANEOUS | Status: AC
  Administered 2013-07-25: 1
  Filled 2013-07-25: qty 5

## 2013-07-25 MED ORDER — DIPHENHYDRAMINE HCL 25 MG PO CAPS: 25.0000 mg | ORAL_CAPSULE | Freq: Four times a day (QID) | ORAL | Status: DC | PRN

## 2013-07-25 MED ORDER — MEGESTROL ACETATE 400 MG/10ML PO SUSP
400.0000 mg | Freq: Two times a day (BID) | ORAL | Status: DC
  Administered 2013-07-25 – 2013-07-28 (×6): 400 mg via ORAL
  Filled 2013-07-25 (×7): qty 10

## 2013-07-25 MED ORDER — DOCUSATE SODIUM 100 MG PO CAPS
200.0000 mg | ORAL_CAPSULE | Freq: Every day | ORAL | Status: DC
  Administered 2013-07-25 – 2013-07-27 (×3): 200 mg via ORAL
  Filled 2013-07-25 (×4): qty 2

## 2013-07-25 NOTE — Evaluation (Signed)
Occupational Therapy Evaluation Patient Details Name: PENNEY DOMANSKI MRN: [id number] DOB: October 04, 1936 Today's Date: 07/25/2013 Time: 4098-1191 OT Time Calculation (min): 51 min  OT Assessment / Plan / Recommendation History of present illness 77 year old female who was recently diagnosed with pancreatic cancer 3 weeks ago who presents with acutely worsening abdominal pain since this morning. The pain is located in her epigastrium and does not radiate. The pain is described as aching and severe. The pain started gradually and progressively worsened since the onset. No alleviating/aggravating factors. The patient has tried nothing for symptoms without relief. Associated symptoms include nausea and vomiting. Patient denies fever, headache, diarrhea, chest pain, SOB, dysuria, constipation.   Clinical Impression   Pt admitted for above.  She presents to OT with generalized weakness, abdominal pain, and significant cognitive deficits.  Currently, she requires max A, overall for BADLs due to cognition.  Pt demonstrated difficulty initiating, and attending to self care tasks to complete them.  She requires an excessive amount of time to complete very simple tasks.  Pt is not oriented to place and time.  She will benefit from continued OT to address below listed deficits allowing her to achieve maximal level of safety and independence with BADLs. Will need 24 hour assist at home.  If pt does not progress significantly, she may need SNF    OT Assessment  Patient needs continued OT Services    Follow Up Recommendations  Home health OT;Supervision/Assistance - 24 hour (vs SNF if she fails to progress adequately )    Barriers to Discharge      Equipment Recommendations  3 in 1 bedside comode;Hospital bed    Recommendations for Other Services    Frequency  Min 2X/week    Precautions / Restrictions Precautions Precautions: Fall Restrictions Weight Bearing Restrictions: No   Pertinent Vitals/Pain      ADL  Eating/Feeding: Moderate assistance (due to attentional deficits) Where Assessed - Eating/Feeding: Bed level Grooming: Wash/dry hands;Wash/dry face;Teeth care;Brushing hair;Maximal assistance Where Assessed - Grooming: Supported sitting;Unsupported sitting Upper Body Bathing: Maximal assistance Where Assessed - Upper Body Bathing: Unsupported sitting;Supported sitting Lower Body Bathing: Maximal assistance Where Assessed - Lower Body Bathing: Supported sit to stand Upper Body Dressing: Maximal assistance Where Assessed - Upper Body Dressing: Unsupported sitting Lower Body Dressing: Maximal assistance Where Assessed - Lower Body Dressing: Supported sit to stand Toilet Transfer: Minimal assistance Toilet Transfer Method: Sit to Barista: Comfort height toilet;Grab bars Toileting - Clothing Manipulation and Hygiene: Minimal assistance Where Assessed - Glass blower/designer Manipulation and Hygiene: Standing Transfers/Ambulation Related to ADLs: min A ADL Comments: Pt requires max A due to cognitive deficits.  Takes an excessively long time to initiate activity and seldomly completes task.  It took 40 mins for pt to move to EOB, ambulate to BR, toilet, and return to bed    OT Diagnosis: Generalized weakness;Cognitive deficits;Acute pain  OT Problem List: Decreased strength;Decreased activity tolerance;Impaired balance (sitting and/or standing);Decreased cognition;Decreased safety awareness;Decreased knowledge of use of DME or AE;Pain OT Treatment Interventions: Self-care/ADL training;DME and/or AE instruction;Therapeutic activities;Cognitive remediation/compensation;Patient/family education;Balance training   OT Goals(Current goals can be found in the care plan section) Acute Rehab OT Goals Patient Stated Goal: Pt did not state OT Goal Formulation: With patient Time For Goal Achievement: 08/08/13 Potential to Achieve Goals: Fair ADL Goals Pt Will Perform  Grooming: with min guard assist;standing Pt Will Perform Upper Body Bathing: with set-up;sitting Pt Will Perform Lower Body Bathing: with min assist;sit to/from stand  Pt Will Perform Upper Body Dressing: with supervision;sitting Pt Will Perform Lower Body Dressing: with min assist;sit to/from stand Pt Will Transfer to Toilet: with min guard assist;ambulating;regular height toilet;grab bars Pt Will Perform Toileting - Clothing Manipulation and hygiene: with min guard assist;sit to/from stand  Visit Information  Last OT Received On: 07/25/13 Assistance Needed: +1 History of Present Illness: 77 year old female who was recently diagnosed with pancreatic cancer 3 weeks ago who presents with acutely worsening abdominal pain since this morning. The pain is located in her epigastrium and does not radiate. The pain is described as aching and severe. The pain started gradually and progressively worsened since the onset. No alleviating/aggravating factors. The patient has tried nothing for symptoms without relief. Associated symptoms include nausea and vomiting. Patient denies fever, headache, diarrhea, chest pain, SOB, dysuria, constipation.       Prior Functioning     Home Living Family/patient expects to be discharged to:: Private residence Living Arrangements: Spouse/significant other Available Help at Discharge: Family Type of Home: House Home Access: Stairs to enter Secretary/administrator of Steps: 5 Entrance Stairs-Rails: Right Home Layout: One level Home Equipment: None Prior Function Level of Independence: Independent Communication Communication: No difficulties         Vision/Perception Vision - History Baseline Vision: Wears glasses all the time Praxis Praxis: Impaired Praxis Impairment Details: Motor planning   Cognition  Cognition Arousal/Alertness: Awake/alert Behavior During Therapy: Flat affect Overall Cognitive Status: Impaired/Different from baseline Area of  Impairment: Attention;Orientation;Following commands;Safety/judgement;Awareness;Problem solving Orientation Level: Disoriented to;Place;Time Current Attention Level: Sustained;Focused Following Commands: Follows one step commands with increased time Safety/Judgement: Decreased awareness of safety Awareness: Intellectual Problem Solving: Slow processing;Decreased initiation;Difficulty sequencing;Requires verbal cues;Requires tactile cues    Extremity/Trunk Assessment Upper Extremity Assessment Upper Extremity Assessment: Generalized weakness Lower Extremity Assessment Lower Extremity Assessment: Defer to PT evaluation Cervical / Trunk Assessment Cervical / Trunk Assessment: Normal     Mobility Bed Mobility Bed Mobility: Supine to Sit;Sitting - Scoot to Edge of Bed;Sit to Supine Supine to Sit: 4: Min assist;With rails;HOB elevated Sitting - Scoot to Edge of Bed: 4: Min guard;With rail Sit to Supine: 4: Min assist;With rail;HOB elevated Details for Bed Mobility Assistance: Increased time.  Assist to initiate and complete task.  Verbal cues for sequencing Transfers Transfers: Sit to Stand;Stand to Sit Sit to Stand: 4: Min assist;With upper extremity assist;From bed;From toilet Stand to Sit: 4: Min assist;With upper extremity assist;To bed;To toilet Details for Transfer Assistance: increased time.  Min A to full rise and for balance/safety     Exercise     Balance Balance Balance Assessed: Yes Dynamic Standing Balance Dynamic Standing - Balance Support: No upper extremity supported Dynamic Standing - Level of Assistance: 4: Min assist   End of Session OT - End of Session Activity Tolerance: Patient tolerated treatment well Patient left: in bed;with call bell/phone within reach;with bed alarm set;with family/visitor present Nurse Communication: Mobility status  GO     Jeani Hawking M 07/25/2013, 6:52 PM

## 2013-07-25 NOTE — Progress Notes (Signed)
IP PROGRESS NOTE  Subjective:   She was recently diagnosed with "borderline "resectable pancreas cancer. I was asked by Dr. Rosemarie Beath to see her after she was admitted yesterday with failure to thrive and acute renal failure. She complains of abdominal pain this morning. No diarrhea.  Objective: Vital signs in last 24 hours: Blood pressure 90/48, pulse 79, temperature 98.1 F (36.7 C), temperature source Oral, resp. rate 18, height 5\' 3"  (1.6 m), weight 201 lb 4.5 oz (91.3 kg), SpO2 100.00%.  Intake/Output from previous day: 10/20 0701 - 10/21 0700 In: 4350 [P.O.:600; I.V.:3750] Out: 825 [Urine:575; Drains:250]  Physical Exam:  HEENT: ? Mild scleral icterus, no Lungs: Clear bilaterally Cardiac: Regular rate and rhythm Abdomen: Right upper quadrant biliary drain, the abdomen is soft, no mass Extremities: No leg edema   Lab Results:  Recent Labs  07/24/13 1455 07/25/13 0443  WBC 15.3* 14.1*  HGB 9.8* 9.4*  HCT 29.1* 28.2*  PLT 404* 381    BMET  Recent Labs  07/24/13 1455 07/25/13 0443  NA 130* 131*  K 5.2* 5.0  CL 101 104  CO2 13* 12*  GLUCOSE 105* 89  BUN 110* 108*  CREATININE 7.26* 5.51*  CALCIUM 8.5 8.7    Studies/Results: Ct Abdomen Wo Contrast  07/24/2013   CLINICAL DATA:  Abdominal pain. Pancreatitis. Pancreatic carcinoma. Biliary drainage catheter. Elevated white blood count. Renal insufficiency.  EXAM: CT ABDOMEN WITHOUT CONTRAST  TECHNIQUE: Multidetector CT imaging of the abdomen was performed following the standard protocol without IV contrast.  COMPARISON:  CT abdomen 07/02/2013  FINDINGS: Interval placement of a biliary drainage catheter through the left lobe and into the common bile duct and into the duodenum. Bile ducts are now adequately decompressed. Multiple hepatic low-density lesions are stable may represent cysts. Pancreatic head mass is better seen on the prior study. This is partially obscured by streak artifact from barium in the colon.   Negative for bowel obstruction. No adenopathy. Atherosclerotic aorta. No free fluid. No adenopathy. Lung bases are clear.  IMPRESSION: Biliary drainage catheter in satisfactory position with decompression of the bile ducts.  Multiple liver lesions compatible with cysts.  No evidence of pancreatitis. Pancreatic head mass not well seen on the current study due to artifact from barium and lack of intravenous contrast.   Electronically Signed   By: Marlan Palau M.D.   On: 07/24/2013 00:12   X-ray Chest Pa And Lateral   07/24/2013   CLINICAL DATA:  Shortness of breath. Abdominal pain. Acute pancreatitis. History of pancreatic cancer.  EXAM: CHEST  2 VIEW  COMPARISON:  07/02/2013  FINDINGS: The heart size and mediastinal contours are within normal limits. Both lungs are clear. The visualized skeletal structures are unremarkable. Displacement of the trachea towards the right consistent with thyroid goiter. Postoperative changes in the cervical spine. Calcified and tortuous aorta. Degenerative changes in the spine and shoulders. No significant change since previous study.  IMPRESSION: No active cardiopulmonary disease.   Electronically Signed   By: Burman Nieves M.D.   On: 07/24/2013 00:11    Medications: I have reviewed the patient's current medications.  Assessment/Plan:  1. Adenocarcinoma of the pancreas-borderline resectable disease with the plan to begin neoadjuvant Xeloda/radiation  2. Obstructive jaundice secondary to #1, status post placement of a biliary stent, the bilirubin is mildly elevated  3. Acute renal failure-? Secondary to ATN related to volume depletion,? Contrast nephropathy  4. Anorexia/malnutrition secondary to pancreas cancer  5. Anemia  She was admitted with failure to  thrive and acute renal failure. The renal failure is most likely related to dehydration in the setting of anorexia. She is not a candidate to begin Xeloda or radiation in her current condition. The Xeloda  cannot be given in the setting of renal failure.  Recommendations:  1. Continue management of renal failure per Dr. Timothy Lasso  2. Hold Xeloda and radiation  3. Narcotic analgesics for pain  4. Appetite stimulant/nutrition supplements  I will check on her 07/27/2013. Please call oncology as needed.   LOS: 2 days   Keagon Glascoe  07/25/2013, 2:16 PM

## 2013-07-25 NOTE — Progress Notes (Signed)
  Subjective: Pt doing fair; reports no increasing abd pain, nausea or vomiting; has eaten lunch today  Objective: Vital signs in last 24 hours: Temp:  [98.1 F (36.7 C)-98.2 F (36.8 C)] 98.1 F (36.7 C) (10/21 0620) Pulse Rate:  [74-79] 79 (10/21 0620) Resp:  [18] 18 (10/21 0620) BP: (90-140)/(38-49) 90/48 mmHg (10/21 0620) SpO2:  [100 %] 100 % (10/21 0620) Weight:  [201 lb 4.5 oz (91.3 kg)] 201 lb 4.5 oz (91.3 kg) (10/21 0620) Last BM Date: 07/08/13  Intake/Output from previous day: 10/20 0701 - 10/21 0700 In: 4350 [P.O.:600; I.V.:3750] Out: 825 [Urine:575; Drains:250] Intake/Output this shift: Total I/O In: 120 [P.O.:120] Out: -   Biliary drain intact, insertion site ok, mildly tender; drain currently capped, t bili stable at 1.4  Lab Results:   Recent Labs  07/24/13 1455 07/25/13 0443  WBC 15.3* 14.1*  HGB 9.8* 9.4*  HCT 29.1* 28.2*  PLT 404* 381   BMET  Recent Labs  07/24/13 1455 07/25/13 0443  NA 130* 131*  K 5.2* 5.0  CL 101 104  CO2 13* 12*  GLUCOSE 105* 89  BUN 110* 108*  CREATININE 7.26* 5.51*  CALCIUM 8.5 8.7   PT/INR  Recent Labs  07/24/13 0400  LABPROT 15.3*  INR 1.24   ABG No results found for this basename: PHART, PCO2, PO2, HCO3,  in the last 72 hours  Studies/Results: Ct Abdomen Wo Contrast  07/24/2013   CLINICAL DATA:  Abdominal pain. Pancreatitis. Pancreatic carcinoma. Biliary drainage catheter. Elevated white blood count. Renal insufficiency.  EXAM: CT ABDOMEN WITHOUT CONTRAST  TECHNIQUE: Multidetector CT imaging of the abdomen was performed following the standard protocol without IV contrast.  COMPARISON:  CT abdomen 07/02/2013  FINDINGS: Interval placement of a biliary drainage catheter through the left lobe and into the common bile duct and into the duodenum. Bile ducts are now adequately decompressed. Multiple hepatic low-density lesions are stable may represent cysts. Pancreatic head mass is better seen on the prior study.  This is partially obscured by streak artifact from barium in the colon.  Negative for bowel obstruction. No adenopathy. Atherosclerotic aorta. No free fluid. No adenopathy. Lung bases are clear.  IMPRESSION: Biliary drainage catheter in satisfactory position with decompression of the bile ducts.  Multiple liver lesions compatible with cysts.  No evidence of pancreatitis. Pancreatic head mass not well seen on the current study due to artifact from barium and lack of intravenous contrast.   Electronically Signed   By: Marlan Palau M.D.   On: 07/24/2013 00:12   X-ray Chest Pa And Lateral   07/24/2013   CLINICAL DATA:  Shortness of breath. Abdominal pain. Acute pancreatitis. History of pancreatic cancer.  EXAM: CHEST  2 VIEW  COMPARISON:  07/02/2013  FINDINGS: The heart size and mediastinal contours are within normal limits. Both lungs are clear. The visualized skeletal structures are unremarkable. Displacement of the trachea towards the right consistent with thyroid goiter. Postoperative changes in the cervical spine. Calcified and tortuous aorta. Degenerative changes in the spine and shoulders. No significant change since previous study.  IMPRESSION: No active cardiopulmonary disease.   Electronically Signed   By: Burman Nieves M.D.   On: 07/24/2013 00:11    Anti-infectives: Anti-infectives   None      Assessment/Plan: s/p PTC with I/E biliary drain 10/2 secondary to obst jaundice from panc ca; cont current tx, improve nutrition  LOS: 2 days    ALLRED,D [facility] 07/25/2013

## 2013-07-25 NOTE — Progress Notes (Signed)
Subjective: Pt came in dehydrated and in renal failure prior to initiation of chemoradiation for borderline resectable pancreatic cancer.  She has little to no appetite.    Objective: Vital signs in last 24 hours: Temp:  [97.6 F (36.4 C)-98.2 F (36.8 C)] 98.1 F (36.7 C) (10/21 0620) Pulse Rate:  [67-79] 79 (10/21 0620) Resp:  [18] 18 (10/21 0620) BP: (90-140)/(34-49) 90/48 mmHg (10/21 0620) SpO2:  [100 %] 100 % (10/21 0620) Weight:  [201 lb 4.5 oz (91.3 kg)] 201 lb 4.5 oz (91.3 kg) (10/21 0620) Last BM Date: 07/08/13  Intake/Output from previous day: 10/20 0701 - 10/21 0700 In: 4350 [P.O.:600; I.V.:3750] Out: 825 [Urine:575; Drains:250] Intake/Output this shift: Total I/O In: 120 [P.O.:120] Out: -   General appearance: alert and cooperative GI: soft, non distended, tender at drain site Extremities: extremities normal, atraumatic, no cyanosis or edema  Lab Results:   Recent Labs  07/24/13 1455 07/25/13 0443  WBC 15.3* 14.1*  HGB 9.8* 9.4*  HCT 29.1* 28.2*  PLT 404* 381   BMET  Recent Labs  07/24/13 1455 07/25/13 0443  NA 130* 131*  K 5.2* 5.0  CL 101 104  CO2 13* 12*  GLUCOSE 105* 89  BUN 110* 108*  CREATININE 7.26* 5.51*  CALCIUM 8.5 8.7   PT/INR  Recent Labs  07/24/13 0400  LABPROT 15.3*  INR 1.24   ABG No results found for this basename: PHART, PCO2, PO2, HCO3,  in the last 72 hours  Studies/Results: Ct Abdomen Wo Contrast  07/24/2013   CLINICAL DATA:  Abdominal pain. Pancreatitis. Pancreatic carcinoma. Biliary drainage catheter. Elevated white blood count. Renal insufficiency.  EXAM: CT ABDOMEN WITHOUT CONTRAST  TECHNIQUE: Multidetector CT imaging of the abdomen was performed following the standard protocol without IV contrast.  COMPARISON:  CT abdomen 07/02/2013  FINDINGS: Interval placement of a biliary drainage catheter through the left lobe and into the common bile duct and into the duodenum. Bile ducts are now adequately  decompressed. Multiple hepatic low-density lesions are stable may represent cysts. Pancreatic head mass is better seen on the prior study. This is partially obscured by streak artifact from barium in the colon.  Negative for bowel obstruction. No adenopathy. Atherosclerotic aorta. No free fluid. No adenopathy. Lung bases are clear.  IMPRESSION: Biliary drainage catheter in satisfactory position with decompression of the bile ducts.  Multiple liver lesions compatible with cysts.  No evidence of pancreatitis. Pancreatic head mass not well seen on the current study due to artifact from barium and lack of intravenous contrast.   Electronically Signed   By: Marlan Palau M.D.   On: 07/24/2013 00:12   X-ray Chest Pa And Lateral   07/24/2013   CLINICAL DATA:  Shortness of breath. Abdominal pain. Acute pancreatitis. History of pancreatic cancer.  EXAM: CHEST  2 VIEW  COMPARISON:  07/02/2013  FINDINGS: The heart size and mediastinal contours are within normal limits. Both lungs are clear. The visualized skeletal structures are unremarkable. Displacement of the trachea towards the right consistent with thyroid goiter. Postoperative changes in the cervical spine. Calcified and tortuous aorta. Degenerative changes in the spine and shoulders. No significant change since previous study.  IMPRESSION: No active cardiopulmonary disease.   Electronically Signed   By: Burman Nieves M.D.   On: 07/24/2013 00:11    Anti-infectives: Anti-infectives   None      Assessment/Plan: s/p * No surgery found * Fluids per Dr. Timothy Lasso for ARF Severe protein calorie malnutrition- Nutritional supplementation - get nutrition  consult, add megace for appetite stimulation.  Take supplements. Check prealbumin. May need TNA.    If she does not turn around, she may need hospice.     LOS: 2 days    [facility] 07/25/2013

## 2013-07-25 NOTE — Evaluation (Addendum)
Physical Therapy Evaluation Patient Details Name: ODETH BRY MRN: [id number] DOB: 17-Sep-1936 Today's Date: 07/25/2013 Time: 4098-1191 PT Time Calculation (min): 25 min  PT Assessment / Plan / Recommendation History of Present Illness  77 year old female who was recently diagnosed with pancreatic cancer 3 weeks ago who presents with acutely worsening abdominal pain since this morning. The pain is located in her epigastrium and does not radiate. The pain is described as aching and severe. The pain started gradually and progressively worsened since the onset. No alleviating/aggravating factors. The patient has tried nothing for symptoms without relief. Associated symptoms include nausea and vomiting. Patient denies fever, headache, diarrhea, chest pain, SOB, dysuria, constipation.  Clinical Impression  On eval, pt required Min assist for mobility-pt only willing to ambulate in room. Demonstrates general weakness, decreased activity tolerance, impaired dynamic balance. Will follow to further assess gait and balance on next session if pt will participate. Recommend HHPT and 24 hour supervision/assist vs SNF, at this time. May need RW.     PT Assessment  Patient needs continued PT services    Follow Up Recommendations  Home health PT;Supervision/Assistance - 24 hour vs SNF, depending on progress    Does the patient have the potential to tolerate intense rehabilitation      Barriers to Discharge        Equipment Recommendations  Rolling walker with 5" wheels (possibly-to be determined)    Recommendations for Other Services OT consult   Frequency Min 3X/week    Precautions / Restrictions Precautions Precautions: Fall Restrictions Weight Bearing Restrictions: No   Pertinent Vitals/Pain Pt denied pain      Mobility  Bed Mobility Bed Mobility: Supine to Sit;Sit to Supine Supine to Sit: 4: Min guard Sit to Supine: 4: Min guard Details for Bed Mobility Assistance: Increased time.  Moderate  VCS for initiation and completion of task.  Transfers Transfers: Sit to Stand;Stand to Sit Sit to Stand: 4: Min assist;From bed;From toilet Stand to Sit: 4: Min assist;To toilet;To bed Details for Transfer Assistance: Increased time. Moderate VCS for initiation and completion of task. Assist to rise, stabilize, control descent Ambulation/Gait Ambulation/Gait Assistance: 4: Min assist Ambulation Distance (Feet): 15 Feet (x 2) Assistive device: None Ambulation/Gait Assistance Details: Assist to stabilize throughout distance. Pt reaching out for objects to hold onto (bed rails, wall, sink, etc). Unsteady. Pt declined to ambulate outside of room Gait Pattern: Step-through pattern;Decreased stride length    Exercises     PT Diagnosis: Difficulty walking;Generalized weakness;Abnormality of gait  PT Problem List: Decreased strength;Decreased activity tolerance;Decreased balance;Decreased mobility;Decreased knowledge of precautions;Decreased knowledge of use of DME PT Treatment Interventions: DME instruction;Gait training;Functional mobility training;Therapeutic activities;Therapeutic exercise;Balance training;Patient/family education     PT Goals(Current goals can be found in the care plan section) Acute Rehab PT Goals Patient Stated Goal: to feel better PT Goal Formulation: With patient/family Time For Goal Achievement: 08/08/13 Potential to Achieve Goals: Good  Visit Information  Last PT Received On: 07/25/13 Assistance Needed: +1 History of Present Illness: 77 year old female who was recently diagnosed with pancreatic cancer 3 weeks ago who presents with acutely worsening abdominal pain since this morning. The pain is located in her epigastrium and does not radiate. The pain is described as aching and severe. The pain started gradually and progressively worsened since the onset. No alleviating/aggravating factors. The patient has tried nothing for symptoms without relief.  Associated symptoms include nausea and vomiting. Patient denies fever, headache, diarrhea, chest pain, SOB, dysuria, constipation.  Prior Functioning  Home Living Family/patient expects to be discharged to:: Private residence Living Arrangements: Spouse/significant other Available Help at Discharge: Family Type of Home: House Home Access: Stairs to enter Secretary/administrator of Steps: 5 Entrance Stairs-Rails: Right Home Layout: One level Home Equipment: None Prior Function Level of Independence: Independent Communication Communication: No difficulties    Cognition  Cognition Arousal/Alertness: Awake/alert Behavior During Therapy: Flat affect Overall Cognitive Status: Difficult to assess Area of Impairment: Attention;Following commands;Safety/judgement;Problem solving Current Attention Level: Focused Following Commands: Follows one step commands with increased time Safety/Judgement: Decreased awareness of safety Problem Solving: Slow processing;Decreased initiation;Requires verbal cues;Requires tactile cues    Extremity/Trunk Assessment Upper Extremity Assessment Upper Extremity Assessment: Defer to OT evaluation Lower Extremity Assessment Lower Extremity Assessment: Generalized weakness Cervical / Trunk Assessment Cervical / Trunk Assessment: Normal   Balance Balance Balance Assessed: Yes Dynamic Standing Balance Dynamic Standing - Balance Support: No upper extremity supported Dynamic Standing - Level of Assistance: 4: Min assist  End of Session PT - End of Session Activity Tolerance: Patient limited by fatigue Patient left: in bed;with call bell/phone within reach;with bed alarm set;with family/visitor present  GP     Rebeca Alert, MPT Pager: [phone]

## 2013-07-25 NOTE — Progress Notes (Signed)
Subjective: Admitted c Ab pain, ARF, Weakness, AFTT. Cr 8+ No current Ab pain, CP or SOB Yesterday I talked c GI, Onc, Rad Onc, IR and surgery to coordinate care. Perc drain is now clamped. She is a little frustrated and looks better than expected/Ought too. Eating breakfast.  Objective: Vital signs in last 24 hours: Temp:  [97.6 F (36.4 C)-98.2 F (36.8 C)] 98.1 F (36.7 C) (10/21 0620) Pulse Rate:  [67-79] 79 (10/21 0620) Resp:  [18] 18 (10/21 0620) BP: (90-140)/(34-49) 90/48 mmHg (10/21 0620) SpO2:  [100 %] 100 % (10/21 0620) Weight:  [91.3 kg (201 lb 4.5 oz)] 91.3 kg (201 lb 4.5 oz) (10/21 0620) Weight change: 3.6 kg (7 lb 15 oz) Last BM Date: 07/08/13  CBG (last 3)  No results found for this basename: GLUCAP,  in the last 72 hours  Intake/Output from previous day:  Intake/Output Summary (Last 24 hours) at 07/25/13 0729 Last data filed at 07/24/13 2308  Gross per 24 hour  Intake   4350 ml  Output    825 ml  Net   3525 ml   10/20 0701 - 10/21 0700 In: 4350 [P.O.:600; I.V.:3750] Out: 825 [Urine:575; Drains:250]   Physical Exam  General appearance: Better than expected. A and O Eyes: no scleral icterus Throat: oropharynx moist without erythema Resp: CTA Cardio: Reg c known murmur GI: softer, non-tender; bowel sounds fine.  Perc drainage catheter in place = CDI.  Now clamped. Extremities: no clubbing, cyanosis or edema.  SCDs  Lab Results:  Recent Labs  07/24/13 1455 07/25/13 0443  NA 130* 131*  K 5.2* 5.0  CL 101 104  CO2 13* 12*  GLUCOSE 105* 89  BUN 110* 108*  CREATININE 7.26* 5.51*  CALCIUM 8.5 8.7     Recent Labs  07/24/13 0400 07/25/13 0443  AST 112* 77*  ALT 204* 162*  ALKPHOS 169* 184*  BILITOT 1.4* 1.4*  PROT 6.3 6.2  ALBUMIN 2.5* 2.5*     Recent Labs  07/23/13 1700  07/24/13 1455 07/25/13 0443  WBC 18.4*  < > 15.3* 14.1*  NEUTROABS 14.2*  --   --   --   HGB 12.4  < > 9.8* 9.4*  HCT 35.7*  < > 29.1* 28.2*  MCV 80.0  <  > 82.4 81.7  PLT 503*  < > 404* 381  < > = values in this interval not displayed.  Lab Results  Component Value Date   INR 1.24 07/24/2013   INR 0.94 07/02/2013    No results found for this basename: CKTOTAL, CKMB, CKMBINDEX, TROPONINI,  in the last 72 hours  No results found for this basename: TSH, T4TOTAL, FREET3, T3FREE, THYROIDAB,  in the last 72 hours  No results found for this basename: VITAMINB12, FOLATE, FERRITIN, TIBC, IRON, RETICCTPCT,  in the last 72 hours  Micro Results: Recent Results (from the past 240 hour(s))  URINE CULTURE     Status: None   Collection Time    07/23/13  6:31 PM      Result Value Range Status   Specimen Description URINE, CATHETERIZED   Final   Special Requests Normal   Final   Culture  Setup Time     Final   Value: 07/24/2013 00:17     Performed at [facility] Count     Final   Value: NO GROWTH     Performed at [facility]   Culture     Final  Value: NO GROWTH     Performed at [facility]   Report Status 07/24/2013 FINAL   Final     Studies/Results: Ct Abdomen Wo Contrast  07/24/2013   CLINICAL DATA:  Abdominal pain. Pancreatitis. Pancreatic carcinoma. Biliary drainage catheter. Elevated white blood count. Renal insufficiency.  EXAM: CT ABDOMEN WITHOUT CONTRAST  TECHNIQUE: Multidetector CT imaging of the abdomen was performed following the standard protocol without IV contrast.  COMPARISON:  CT abdomen 07/02/2013  FINDINGS: Interval placement of a biliary drainage catheter through the left lobe and into the common bile duct and into the duodenum. Bile ducts are now adequately decompressed. Multiple hepatic low-density lesions are stable may represent cysts. Pancreatic head mass is better seen on the prior study. This is partially obscured by streak artifact from barium in the colon.  Negative for bowel obstruction. No adenopathy. Atherosclerotic aorta. No free fluid. No adenopathy. Lung bases are clear.   IMPRESSION: Biliary drainage catheter in satisfactory position with decompression of the bile ducts.  Multiple liver lesions compatible with cysts.  No evidence of pancreatitis. Pancreatic head mass not well seen on the current study due to artifact from barium and lack of intravenous contrast.   Electronically Signed   By: Marlan Palau M.D.   On: 07/24/2013 00:12   X-ray Chest Pa And Lateral   07/24/2013   CLINICAL DATA:  Shortness of breath. Abdominal pain. Acute pancreatitis. History of pancreatic cancer.  EXAM: CHEST  2 VIEW  COMPARISON:  07/02/2013  FINDINGS: The heart size and mediastinal contours are within normal limits. Both lungs are clear. The visualized skeletal structures are unremarkable. Displacement of the trachea towards the right consistent with thyroid goiter. Postoperative changes in the cervical spine. Calcified and tortuous aorta. Degenerative changes in the spine and shoulders. No significant change since previous study.  IMPRESSION: No active cardiopulmonary disease.   Electronically Signed   By: Burman Nieves M.D.   On: 07/24/2013 00:11     Medications: Scheduled: . feeding supplement (ENSURE)  1 Container Oral Q24H  . lactose free nutrition  237 mL Oral BID BM  . pantoprazole (PROTONIX) IV  40 mg Intravenous QHS   Continuous: . sodium chloride 200 mL/hr at 07/25/13 0218     Assessment/Plan: Principal Problem:   Acute renal failure Active Problems:   Essential hypertension   Obstructive jaundice   Acute pancreatitis   Protein-calorie malnutrition, severe   Pancreatic cancer   Malignant tumor head pancreas  ARF - continue IVFs as Cr has gone from 8.11 - 8.01 - 7.26 - 5.51. Continue NS @ 200 cc per hr while monitoring Cr and Electrolytes.  Na better. Still acidotic. No Acute HD needs despite Acidosis/Electrolyte issues etc. She is not Vol Overloaded and this all seems pre-renal. Low threshold to get renal involved but does not need currently as they would just  recommend fluids.  Hyponatremia from the renal failure already better.   Ob Jaundice c Perc drain in place - Non-Contrast CT looked better than expected c results c/w - Biliary drainage catheter in satisfactory position with decompression of the bile ducts.  Multiple liver lesions compatible with cysts.  No evidence of pancreatitis. Pancreatic head mass not well seen on the current study due to artifact from barium and lack of intravenous contrast. Appreciate IR, GI, Onc, and Surgery input.  We decided to clamp drain and follow T Bili.  So far looks fine.  Dr Donell Beers will see pt on Wednesday.  Transaminitis c AST/ALT 150-280.  Follow labs - again a little better this am.  Current T Bili 1.4 Leukocytosis - Stable to a little better and no evidence of underlying infection.   Borderline resectable adenocarcinoma of the pancreas - She is set to start Intensity Modulated Radiotherapy (IMRT) and will receive chemotherapy during the course of radiation treatment. She already started low dose Xeloda - not currently getting. I believe radiation was to start next week.  I talked c Dr Mitzi Hansen and if we can get her stabilized enough we will explore the possibility to start XRT while here in the hospital. The ultimate goal is to get her to shrink the tumor, have her functional and then work on Resection/Whipple. I have brought up [facility] she/family is thinking @ this.  If we cannot make any progress quickly I am concerned that we will have no choice but to entertain [facility].  I discussed TNA or PEJ tube feedings and GI wants [place] to do PEJ via IR and Gen Surg wants [place] to start TNA - Until the ARF is resolved I will be blocked in getting a PICC line to start the foods.  Will decide in next day or so.   Severe Protein Calorie malnutrition - She is in desperate need of Calories. Consider J Tube vrs TNA - see above. Because she was hungry yesterday I liberalized her diet. Nutrition saw her and  recommended: Provide Boost Plus BID  Provide Ensure Pudding and Magic Cup once daily each  Recommend advancing diet I added D5.  AFTT - Worsening. Start PT/OT/CW.  Worsening Anemia - Follow CBC for needs of transfusion.  No Obvious bleeding at this time. Hbg stable this am  Ab Pain/N/V - Antiemetics prn  DVT Proph - Squeezers. Hold Lovenox c the renal failure.   Pancreatitis based on Lipase 192-204 but no specific Acute Sxs.  Dr Myna Hidalgo started her on Creon as outpt and hard to know if it was helping. CT was (-).  Continue fluids.  Dondra Spry her daughter: [phone]   HTN - BP fine - Hold all meds.   GERD - IV Protonix - PPI. Nexium at home     LOS: 2 days   Jaszmine Navejas M 07/25/2013, 7:29 AM

## 2013-07-26 LAB — CBC
Hemoglobin: 8.9 g/dL — ABNORMAL LOW (ref 12.0–15.0)
MCHC: 34.1 g/dL (ref 30.0–36.0)
MCV: 81.3 fL (ref 78.0–100.0)
RBC: 3.21 MIL/uL — ABNORMAL LOW (ref 3.87–5.11)
WBC: 14 10*3/uL — ABNORMAL HIGH (ref 4.0–10.5)

## 2013-07-26 LAB — COMPREHENSIVE METABOLIC PANEL
ALT: 134 U/L — ABNORMAL HIGH (ref 0–35)
Albumin: 2.3 g/dL — ABNORMAL LOW (ref 3.5–5.2)
Alkaline Phosphatase: 247 U/L — ABNORMAL HIGH (ref 39–117)
BUN: 82 mg/dL — ABNORMAL HIGH (ref 6–23)
CO2: 12 mEq/L — ABNORMAL LOW (ref 19–32)
Chloride: 114 mEq/L — ABNORMAL HIGH (ref 96–112)
GFR calc Af Amer: 21 mL/min — ABNORMAL LOW (ref >90)
GFR calc non Af Amer: 18 mL/min — ABNORMAL LOW (ref >90)
Glucose, Bld: 128 mg/dL — ABNORMAL HIGH (ref 70–99)
Potassium: 4.9 mEq/L (ref 3.5–5.1)
Sodium: 139 mEq/L (ref 135–145)

## 2013-07-26 LAB — PREALBUMIN: Prealbumin: 20.2 mg/dL (ref 17.0–34.0)

## 2013-07-26 MED ORDER — ONDANSETRON HCL 4 MG PO TABS: 4.0000 mg | ORAL_TABLET | Freq: Three times a day (TID) | ORAL | Status: DC | PRN

## 2013-07-26 MED ORDER — ENSURE PUDDING PO PUDG
1.0000 | ORAL | Status: DC
  Administered 2013-07-27: 1 via ORAL
  Filled 2013-07-26 (×2): qty 1

## 2013-07-26 MED ORDER — MORPHINE SULFATE 4 MG/ML IJ SOLN
4.0000 mg | Freq: Three times a day (TID) | INTRAMUSCULAR | Status: DC | PRN
  Administered 2013-07-26 – 2013-07-28 (×2): 4 mg via INTRAVENOUS
  Filled 2013-07-26 (×2): qty 1

## 2013-07-26 MED ORDER — PANTOPRAZOLE SODIUM 40 MG PO TBEC
40.0000 mg | DELAYED_RELEASE_TABLET | Freq: Every day | ORAL | Status: DC
  Administered 2013-07-26 – 2013-07-28 (×3): 40 mg via ORAL
  Filled 2013-07-26 (×2): qty 1

## 2013-07-26 MED ORDER — BOOST / RESOURCE BREEZE PO LIQD
1.0000 | Freq: Two times a day (BID) | ORAL | Status: DC
  Administered 2013-07-27 – 2013-07-28 (×2): 1 via ORAL

## 2013-07-26 MED ORDER — HYDRALAZINE HCL 20 MG/ML IJ SOLN: 10.0000 mg | Freq: Four times a day (QID) | INTRAMUSCULAR | Status: DC | PRN

## 2013-07-26 MED ORDER — OXYCODONE HCL 5 MG PO TABS
5.0000 mg | ORAL_TABLET | Freq: Four times a day (QID) | ORAL | Status: DC | PRN
  Administered 2013-07-27 – 2013-07-28 (×3): 5 mg via ORAL
  Filled 2013-07-26 (×3): qty 1

## 2013-07-26 MED ORDER — WHITE PETROLATUM GEL
Status: DC | PRN
  Administered 2013-07-26: 1 via TOPICAL
  Filled 2013-07-26: qty 5

## 2013-07-26 MED ORDER — PANCRELIPASE (LIP-PROT-AMYL) 12000-38000 UNITS PO CPEP
1.0000 | ORAL_CAPSULE | Freq: Three times a day (TID) | ORAL | Status: DC
  Administered 2013-07-26 – 2013-07-28 (×6): 1 via ORAL
  Filled 2013-07-26 (×10): qty 1

## 2013-07-26 NOTE — Progress Notes (Signed)
  Subjective: Pt currently eating; has some mild abd soreness; no N/V  Objective: Vital signs in last 24 hours: Temp:  [98.2 F (36.8 C)-98.6 F (37 C)] 98.2 F (36.8 C) (10/22 1543) Pulse Rate:  [84-86] 86 (10/22 1543) Resp:  [18-20] 20 (10/22 1543) BP: (100-180)/(46-51) 153/51 mmHg (10/22 1543) SpO2:  [96 %-100 %] 100 % (10/22 1543) Last BM Date: 07/26/13  Intake/Output from previous day: 10/21 0701 - 10/22 0700 In: 5923.3 [P.O.:960; I.V.:4763.3] Out: -  Intake/Output this shift: Total I/O In: 1273.7 [P.O.:357; I.V.:916.7] Out: -   Biliary drain intact and capped, insertion site clean and dry, mildly tender; drain flushed easily with 10 cc's sterile NS; t bili 1.5  Lab Results:   Recent Labs  07/25/13 0443 07/26/13 0503  WBC 14.1* 14.0*  HGB 9.4* 8.9*  HCT 28.2* 26.1*  PLT 381 336   BMET  Recent Labs  07/25/13 0443 07/26/13 0503  NA 131* 139  K 5.0 4.9  CL 104 114*  CO2 12* 12*  GLUCOSE 89 128*  BUN 108* 82*  CREATININE 5.51* 2.46*  CALCIUM 8.7 8.6   PT/INR  Recent Labs  07/24/13 0400  LABPROT 15.3*  INR 1.24   ABG No results found for this basename: PHART, PCO2, PO2, HCO3,  in the last 72 hours  Studies/Results: No results found.  Anti-infectives: Anti-infectives   None      Assessment/Plan: s/p PTC with I/E biliary drain secondary to obst jaundice from panc ca 10/2; cont current tx; other plans as per [facility]  LOS: 3 days    Sulay Brymer,D [facility] 07/26/2013

## 2013-07-26 NOTE — Progress Notes (Signed)
Patient refused requested suppository to see if the colace would help first, I will offer suppository again this morning.  Ernesta Amble, RN

## 2013-07-26 NOTE — Progress Notes (Signed)
[facility]  Patient Status: Active (receiving services up to time of hospitalization)  [facility] is providing the following services: RN and HHA  If patient discharges after hours, please call [phone].   Lanae Crumbly 07/26/2013, 11:38 AM

## 2013-07-26 NOTE — Progress Notes (Signed)
Patient ID: Carolyn [name], female   DOB: May 11, 1936, 77 y.o.   MRN: [id number]    Subjective: Pt's prealbumin much higher than expected.  Pt confused.  Thought she was "in jail."  Denies being hungry.  A bit paranoid about thinking the girls think she is "being trouble."    Objective: Vital signs in last 24 hours: Temp:  [98.1 F (36.7 C)-98.6 F (37 C)] 98.3 F (36.8 C) (10/22 0422) Pulse Rate:  [75-85] 85 (10/22 0422) Resp:  [18-20] 18 (10/22 0422) BP: (100-180)/(46-54) 100/48 mmHg (10/22 0422) SpO2:  [96 %-100 %] 96 % (10/22 0422) Last BM Date:  (unable to give me a date)  Intake/Output from previous day: 10/21 0701 - 10/22 0700 In: 5923.3 [P.O.:960; I.V.:4763.3] Out: -  Intake/Output this shift:    General appearance: alert and cooperative, but confused.   GI: soft, non distended, tender at drain site Extremities: extremities normal, atraumatic, no cyanosis or edema  Lab Results:   Recent Labs  07/25/13 0443 07/26/13 0503  WBC 14.1* 14.0*  HGB 9.4* 8.9*  HCT 28.2* 26.1*  PLT 381 336   BMET  Recent Labs  07/25/13 0443 07/26/13 0503  NA 131* 139  K 5.0 4.9  CL 104 114*  CO2 12* 12*  GLUCOSE 89 128*  BUN 108* 82*  CREATININE 5.51* 2.46*  CALCIUM 8.7 8.6   PT/INR  Recent Labs  07/24/13 0400  LABPROT 15.3*  INR 1.24   ABG No results found for this basename: PHART, PCO2, PO2, HCO3,  in the last 72 hours  Studies/Results: No results found.  Anti-infectives: Anti-infectives   None      Assessment/Plan: s/p * No surgery found * Fluids per Dr. Timothy Lasso for ARF Severe protein calorie malnutrition- Nutritional supplementation , megace for appetite stimulation  May need TNA vs tube feeds.  Will see how she does.  Her ARF is rapidly improving.    If she does not turn around, she may need hospice.  She will likely start IMRT tomorrow.     LOS: 3 days    [facility] 07/26/2013

## 2013-07-26 NOTE — Progress Notes (Signed)
Subjective: Admitted c Ab pain, ARF, Weakness, AFTT. No current Ab pain, CP or SOB Perc drain is clamped. Constipated - s/p suppository and has to have BM. "nerves are shot" She is a little frustrated and looks better than expected/Ought too. Eating breakfast.  Objective: Vital signs in last 24 hours: Temp:  [98.1 F (36.7 C)-98.6 F (37 C)] 98.3 F (36.8 C) (10/22 0422) Pulse Rate:  [75-85] 85 (10/22 0422) Resp:  [18-20] 18 (10/22 0422) BP: (100-180)/(46-54) 100/48 mmHg (10/22 0422) SpO2:  [96 %-100 %] 96 % (10/22 0422) Weight change:  Last BM Date:  (unable to give me a date)  CBG (last 3)  No results found for this basename: GLUCAP,  in the last 72 hours  Intake/Output from previous day:  Intake/Output Summary (Last 24 hours) at 07/26/13 0741 Last data filed at 07/26/13 0640  Gross per 24 hour  Intake 5923.33 ml  Output      0 ml  Net 5923.33 ml   10/21 0701 - 10/22 0700 In: 5923.3 [P.O.:960; I.V.:4763.3] Out: -    Physical Exam  General appearance: Better than expected. A and O Eyes: no scleral icterus Throat: oropharynx moist without erythema Resp: CTA Cardio: Reg c known murmur GI: softer, non-tender; bowel sounds fine.  Perc drainage catheter in place = CDI.  Now clamped. Extremities: no clubbing, cyanosis or edema.  SCDs  Lab Results:  Recent Labs  07/25/13 0443 07/26/13 0503  NA 131* 139  K 5.0 4.9  CL 104 114*  CO2 12* 12*  GLUCOSE 89 128*  BUN 108* 82*  CREATININE 5.51* 2.46*  CALCIUM 8.7 8.6     Recent Labs  07/25/13 0443 07/26/13 0503  AST 77* 76*  ALT 162* 134*  ALKPHOS 184* 247*  BILITOT 1.4* 1.5*  PROT 6.2 6.1  ALBUMIN 2.5* 2.3*     Recent Labs  07/23/13 1700  07/25/13 0443 07/26/13 0503  WBC 18.4*  < > 14.1* 14.0*  NEUTROABS 14.2*  --   --   --   HGB 12.4  < > 9.4* 8.9*  HCT 35.7*  < > 28.2* 26.1*  MCV 80.0  < > 81.7 81.3  PLT 503*  < > 381 336  < > = values in this interval not displayed.  Lab Results   Component Value Date   INR 1.24 07/24/2013   INR 0.94 07/02/2013    No results found for this basename: CKTOTAL, CKMB, CKMBINDEX, TROPONINI,  in the last 72 hours  No results found for this basename: TSH, T4TOTAL, FREET3, T3FREE, THYROIDAB,  in the last 72 hours  No results found for this basename: VITAMINB12, FOLATE, FERRITIN, TIBC, IRON, RETICCTPCT,  in the last 72 hours  Micro Results: Recent Results (from the past 240 hour(s))  URINE CULTURE     Status: None   Collection Time    07/23/13  6:31 PM      Result Value Range Status   Specimen Description URINE, CATHETERIZED   Final   Special Requests Normal   Final   Culture  Setup Time     Final   Value: 07/24/2013 00:17     Performed at [facility] Count     Final   Value: NO GROWTH     Performed at [facility]   Culture     Final   Value: NO GROWTH     Performed at [facility]   Report Status 07/24/2013 FINAL   Final  Studies/Results: No results found.   Medications: Scheduled: . docusate sodium  200 mg Oral Daily  . feeding supplement (ENSURE)  1 Container Oral TID BM  . lactose free nutrition  237 mL Oral BID BM  . megestrol  400 mg Oral BID  . pantoprazole (PROTONIX) IV  40 mg Intravenous QHS   Continuous: . sodium chloride 150 mL/hr at 07/26/13 0416  . dextrose 5 % and 0.9% NaCl 50 mL/hr at 07/25/13 2353     Assessment/Plan: Principal Problem:   Acute renal failure Active Problems:   Essential hypertension   Obstructive jaundice   Acute pancreatitis   Protein-calorie malnutrition, severe   Pancreatic cancer   Malignant tumor head pancreas  ARF - continue IVFs as Cr has gone from 8.11 - 8.01 - 7.26 - 5.51 - 2.46. Continue NS but lower to @ 100 cc per hr while monitoring Cr and Electrolytes.  Na normal. Still acidotic. No Acute HD needs despite Acidosis/Electrolyte issues etc. She is not Vol Overloaded and this is all pre-renal.   Ob Jaundice c Perc drain in  place - Non-Contrast CT looked better than expected c results c/w - Biliary drainage catheter in satisfactory position with decompression of the bile ducts.  Multiple liver lesions compatible with cysts.  No evidence of pancreatitis. Pancreatic head mass not well seen on the current study due to artifact from barium and lack of intravenous contrast. Appreciate IR, GI, Onc, and Surgery input.  We decided to clamp drain and follow T Bili.  So far looks fine.  Dr Donell Beers saw pt yesterday and focused on nutritional support.  Megace added.  Transaminitis c AST/ALT 134-280. Follow labs - again a little better this am.  Current T Bili 1.5 Leukocytosis - Stable to a little better and no evidence of underlying infection.   Borderline resectable adenocarcinoma of the pancreas - She is set to start Intensity Modulated Radiotherapy (IMRT) and will receive chemotherapy during the course of radiation treatment. She already started low dose Xeloda - not currently getting. I believe radiation was to start next week.  I talked c Dr Mitzi Hansen and if we can get her stabilized enough we will explore the possibility to start XRT while here in the hospital. The ultimate goal is to get her to shrink the tumor, have her functional and then work on Resection/Whipple. I have brought up [facility] she/family is thinking @ this.  If we cannot make any progress quickly I am concerned that we will have no choice but to entertain [facility].  I discussed TNA or PEJ tube feedings and GI wants [place] to do PEJ via IR and Gen Surg wants [place] to start TNA - Until the ARF is resolved I will be blocked in getting a PICC line to start the foods.  Will decide in next day or so.   Severe Protein Calorie malnutrition - She is in desperate need of Calories. Consider J Tube vrs TNA - see above. Surprisingly her pre-albumin is 20.2 - I expected lower. Continue liberalized her diet. Nutrition saw her and recommended: Provide Boost Plus BID  Provide  Ensure Pudding and Magic Cup once daily each  Continue D5.  AFTT - Worsening. PT/OT/CW.  Worsening Anemia - Follow CBC for needs of transfusion.  No Obvious bleeding at this time. Hbg now 8.9  Ab Pain/N/V - Antiemetics prn  DVT Proph - Squeezers. Hold Lovenox c the renal failure.   Pancreatitis based on Lipase 192-204 but no specific Acute Sxs.  Dr Myna Hidalgo started her on Creon as outpt and hard to know if it was helping. CT was (-).  Continue fluids.  Dondra Spry her daughter: [phone]   HTN - BP fine - Hold all meds.   GERD - change to PO PPI. Nexium at home   Today - finish 24 more hrs of IVF and D/c tom am @ 6 am.  Watch 24 more hrs to see if she can maintain her own hydration.  Some oral meds started back.  See Med list.  If Dr Mitzi Hansen wants to start Radiation tomorrow I am OK with that.  Before PEG/TNA considered lets see how much she can force in as she is trying.   LOS: 3 days   Pryor Guettler M 07/26/2013, 7:41 AM

## 2013-07-26 NOTE — Progress Notes (Signed)
NUTRITION FOLLOW UP  Intervention:   Continue Boost BID Provide Resource Breeze BID Decrease Ensure Pudding to once daily Discontinue Magic Cup Encourage PO intake  Nutrition Dx:   Inadequate oral intake related to poor appetite as evidenced by 8% wt loss in less than 3 months; ongoing  Goal:   Pt to meet >/= 90% of their estimated nutrition needs; not met  Monitor:   Diet advancement; regular diet 10/20 PO intake; 25-50% of most meals Weight; stable Labs; low hemoglobin, high creatinine trending down, high BUN trending down, low albumin, low GFR  Assessment:   44 F c New Dx of Pancreatic Cancer - considered Early stage but 4+ cm and in a bad location. It caused her last admission for Obstructive Jaundice requiring a Percutaneous drainage catheter. She has been seen by Dr Cleotilde Neer, Dr Myna Hidalgo, Nutritionist and Rad Onc-Dr Clayton. She is set to start Intensity Modulated Radiotherapy (IMRT) and will receive chemotherapy during the course of radiation treatment. She has had poor PO Intake since D/c and much less in the last 2-3 days. She continues to have N/V. She has lost weight. She has global weakness and is tired. She came to ED today and noted to be in ARF and will need admission for IVF's.  10/20: Pt states she has no appetite, she hasn't eaten much today, and reports eating poorly PTA. Pt complains of some nausea today but, also expresses desire to eat solid food now. Pt currently on full liquid diet. Pt seen by outpatient cancer dietitian who recommended pt eat more frequent snacks and drink a nutritional supplement such as Boost once daily to help pt maintain her weight. RD met with pt during previous admission on 07/03/13- pt had only been eating 25-50% of meals and reported eating primarily saltine crackers for 3 weeks PTA.   10/22: Pt eating 25-50% of most meals but, she reports her appetite is better today. She likes the Boost supplements but, states she has not yet tired the Ensure  Pudding or [employer]. Pt tried [employer] supplement at time of visit and reports liking it a lot. Encouraged pt to consume >75% of meals.    Height: Ht Readings from Last 1 Encounters:  07/23/13 5\' 3"  (1.6 m)    Weight Status:   Wt Readings from Last 1 Encounters:  07/25/13 201 lb 4.5 oz (91.3 kg)    Re-estimated needs:  Kcal: 2100-2300  Protein: 100-110 grams  Fluid: 2.3-2.5 L/day  Skin: non-pitting RLE and LLE edema; abdominal puncture wound  Diet Order: General   Intake/Output Summary (Last 24 hours) at 07/26/13 1305 Last data filed at 07/26/13 1137  Gross per 24 hour  Intake 6040.33 ml  Output      0 ml  Net 6040.33 ml    Last BM: 10/22- constipation, straining   Labs:   Recent Labs Lab 07/24/13 1455 07/25/13 0443 07/26/13 0503  NA 130* 131* 139  K 5.2* 5.0 4.9  CL 101 104 114*  CO2 13* 12* 12*  BUN 110* 108* 82*  CREATININE 7.26* 5.51* 2.46*  CALCIUM 8.5 8.7 8.6  GLUCOSE 105* 89 128*    CBG (last 3)  No results found for this basename: GLUCAP,  in the last 72 hours  Scheduled Meds: . docusate sodium  200 mg Oral Daily  . feeding supplement (ENSURE)  1 Container Oral TID BM  . lactose free nutrition  237 mL Oral BID BM  . lipase/protease/amylase  1 capsule Oral TID AC  .  megestrol  400 mg Oral BID  . pantoprazole  40 mg Oral Daily    Continuous Infusions: . sodium chloride 50 mL/hr at 07/26/13 0741  . dextrose 5 % and 0.9% NaCl 50 mL/hr at 07/25/13 2353    Ian Malkin RD, LDN Inpatient Clinical Dietitian Pager: [phone] After Hours Pager: [phone]

## 2013-07-26 NOTE — Progress Notes (Signed)
Physical Therapy Treatment Patient Details Name: Carolyn [name] MRN: [id number] DOB: 04-11-1936 Today's Date: 07/26/2013 Time: 7829-5621 PT Time Calculation (min): 53 min  PT Assessment / Plan / Recommendation  History of Present Illness 77 year old female who was recently diagnosed with pancreatic cancer 3 weeks ago who presents with acutely worsening abdominal pain since this morning. The pain is located in her epigastrium and does not radiate. The pain is described as aching and severe. The pain started gradually and progressively worsened since the onset. No alleviating/aggravating factors. The patient has tried nothing for symptoms without relief. Associated symptoms include nausea and vomiting. Patient denies fever, headache, diarrhea, chest pain, SOB, dysuria, constipation.   PT Comments   Improved cognition and mobility this session. However pt still demonstrates some cognitive and physical impairments as well as safety issues. Home with 24 hour supervision as long as family can provide current level of care. If not, may need to consider SNF.   Follow Up Recommendations  Home health PT;Supervision/Assistance - 24 hour     Does the patient have the potential to tolerate intense rehabilitation     Barriers to Discharge        Equipment Recommendations  Rolling walker with 5" wheels    Recommendations for Other Services OT consult  Frequency Min 3X/week   Progress towards PT Goals Progress towards PT goals: Progressing toward goals  Plan Discharge plan needs to be updated    Precautions / Restrictions Precautions Precautions: Fall Restrictions Weight Bearing Restrictions: No   Pertinent Vitals/Pain No c/o pain    Mobility  Bed Mobility Bed Mobility: Sit to Supine Sit to Supine: 4: Min guard Details for Bed Mobility Assistance: No external assist given. Pt was able to get back into bed but with some difficulty and awkwardness noted Transfers Transfers: Sit to Stand;Stand  to Sit Sit to Stand: 4: Min assist;From bed Stand to Sit: To toilet;To bed;4: Min assist Details for Transfer Assistance: increased time.  Min A to full rise and for balance/safety Ambulation/Gait Ambulation/Gait Assistance: 4: Min assist Ambulation Distance (Feet): 200 Feet Assistive device: 1 person hand held assist Ambulation/Gait Assistance Details: Assist to stabilize throughout ambulation.  Gait Pattern: Decreased step length - left;Decreased step length - right;Decreased stride length    Exercises     PT Diagnosis:    PT Problem List:   PT Treatment Interventions:     PT Goals (current goals can now be found in the care plan section)    Visit Information  Last PT Received On: 07/26/13 Assistance Needed: +1 History of Present Illness: 77 year old female who was recently diagnosed with pancreatic cancer 3 weeks ago who presents with acutely worsening abdominal pain since this morning. The pain is located in her epigastrium and does not radiate. The pain is described as aching and severe. The pain started gradually and progressively worsened since the onset. No alleviating/aggravating factors. The patient has tried nothing for symptoms without relief. Associated symptoms include nausea and vomiting. Patient denies fever, headache, diarrhea, chest pain, SOB, dysuria, constipation.    Subjective Data      Cognition  Cognition Arousal/Alertness: Awake/alert Behavior During Therapy: WFL for tasks assessed/performed Overall Cognitive Status: Impaired/Different from baseline Area of Impairment: Orientation;Attention;Memory;Safety/judgement;Awareness;Problem solving Orientation Level: Time Current Attention Level: Alternating Memory: Decreased short-term memory Following Commands: Follows one step commands with increased time Safety/Judgement: Decreased awareness of safety Problem Solving: Slow processing;Difficulty sequencing;Requires verbal cues;Requires tactile cues     Balance  End of Session PT - End of Session Activity Tolerance: Patient tolerated treatment well Patient left: in bed;with call bell/phone within reach;with bed alarm set   GP     Rebeca Alert, MPT Pager: [phone]

## 2013-07-26 NOTE — Progress Notes (Signed)
Occupational Therapy Treatment Patient Details Name: Carolyn [name] MRN: [id number] DOB: Jun 04, 1936 Today's Date: 07/26/2013 Time: 0865-7846 OT Time Calculation (min): 45 min  OT Assessment / Plan / Recommendation  History of present illness 77 year old female who was recently diagnosed with pancreatic cancer 3 weeks ago who presents with acutely worsening abdominal pain since this morning. The pain is located in her epigastrium and does not radiate. The pain is described as aching and severe. The pain started gradually and progressively worsened since the onset. No alleviating/aggravating factors. The patient has tried nothing for symptoms without relief. Associated symptoms include nausea and vomiting. Patient denies fever, headache, diarrhea, chest pain, SOB, dysuria, constipation.   OT comments  Pt requires increased time to complete tasks and is tangential with conversation at times. She requires assist and verbal cues for safety. She was fatigued after transferring out of bathroom and needed a seated rest break before being able to continue with activity.    Follow Up Recommendations  Home health OT;Supervision/Assistance - 24 hour;Other (comment) (home if 24/7 available. If not, may need SNF)    Barriers to Discharge       Equipment Recommendations  3 in 1 bedside comode    Recommendations for Other Services    Frequency Min 2X/week   Progress towards OT Goals Progress towards OT goals: Progressing toward goals  Plan Discharge plan remains appropriate    Precautions / Restrictions Precautions Precautions: Fall Restrictions Weight Bearing Restrictions: No   Pertinent Vitals/Pain No complaint of    ADL  Grooming: Performed;Wash/dry hands;Minimal assistance Where Assessed - Grooming: Unsupported standing Toilet Transfer: Performed;Minimal assistance (from commode. pt onto commode with PT) Toilet Transfer Equipment: Comfort height toilet;Grab bars Toileting - Clothing  Manipulation and Hygiene: Performed;Minimal assistance Where Assessed - Toileting Clothing Manipulation and Hygiene: Standing (pt stood to wash periareas in standing. didnt dry herself and needed cues to follow through) ADL Comments: Pt on commode with assist of PT. Pt then transferred off commode and over to sink to wash periareas. Moves slowly and increased time to complete tasks. Pt didnt initiate drying herself off and needed cues but pt states "I dont need to" and pulled up pants. Pt also stating "I can do more of this later." Up with PT to ambulate and then pt transferred to EOB to doff socks with independence and apply lotion to legs. Pt able to state [employer] as location but tangential with conversation. She also states "I know I wasnt right last night."     OT Diagnosis:    OT Problem List:   OT Treatment Interventions:     OT Goals(current goals can now be found in the care plan section)    Visit Information  Last OT Received On: 07/26/13 Assistance Needed: +1 History of Present Illness: 77 year old female who was recently diagnosed with pancreatic cancer 3 weeks ago who presents with acutely worsening abdominal pain since this morning. The pain is located in her epigastrium and does not radiate. The pain is described as aching and severe. The pain started gradually and progressively worsened since the onset. No alleviating/aggravating factors. The patient has tried nothing for symptoms without relief. Associated symptoms include nausea and vomiting. Patient denies fever, headache, diarrhea, chest pain, SOB, dysuria, constipation.    Subjective Data      Prior Functioning       Cognition  Cognition Arousal/Alertness: Awake/alert Behavior During Therapy: WFL for tasks assessed/performed Overall Cognitive Status: Impaired/Different from baseline Area of  Impairment: Orientation;Memory;Safety/judgement;Awareness;Problem solving;Attention Orientation Level: Time Current Attention  Level: Alternating Memory: Decreased short-term memory Following Commands: Follows one step commands with increased time Safety/Judgement: Decreased awareness of safety Problem Solving: Slow processing;Difficulty sequencing;Requires verbal cues;Requires tactile cues    Mobility  Bed Mobility Bed Mobility: Sit to Supine Sit to Supine: 4: Min guard Details for Bed Mobility Assistance: No external assist given. Pt was able to get back into bed but with some difficulty and awkwardness noted Transfers Sit to Stand: 4: Min assist;From bed Stand to Sit: To toilet;To bed;4: Min assist;With upper extremity assist Details for Transfer Assistance: increased time.  Min A to full rise and for balance/safety    Exercises      Balance Balance Balance Assessed: Yes Dynamic Standing Balance Dynamic Standing - Balance Support: No upper extremity supported Dynamic Standing - Level of Assistance: 4: Min assist   End of Session OT - End of Session Activity Tolerance: Patient tolerated treatment well Patient left: in bed;with call bell/phone within reach;with bed alarm set  GO     Lennox Laity [phone] 07/26/2013, 11:57 AM

## 2013-07-27 ENCOUNTER — Inpatient Hospital Stay (HOSPITAL_COMMUNITY): Payer: Medicare Other

## 2013-07-27 LAB — CBC
MCH: 27.1 pg (ref 26.0–34.0)
MCHC: 33.3 g/dL (ref 30.0–36.0)
RDW: 18.4 % — ABNORMAL HIGH (ref 11.5–15.5)

## 2013-07-27 LAB — COMPREHENSIVE METABOLIC PANEL
ALT: 125 U/L — ABNORMAL HIGH (ref 0–35)
AST: 72 U/L — ABNORMAL HIGH (ref 0–37)
Albumin: 2.3 g/dL — ABNORMAL LOW (ref 3.5–5.2)
Alkaline Phosphatase: 289 U/L — ABNORMAL HIGH (ref 39–117)
BUN: 50 mg/dL — ABNORMAL HIGH (ref 6–23)
CO2: 13 mEq/L — ABNORMAL LOW (ref 19–32)
Calcium: 8.6 mg/dL (ref 8.4–10.5)
Chloride: 117 mEq/L — ABNORMAL HIGH (ref 96–112)
Creatinine, Ser: 1.39 mg/dL — ABNORMAL HIGH (ref 0.50–1.10)
GFR calc Af Amer: 42 mL/min — ABNORMAL LOW (ref >90)
GFR calc non Af Amer: 36 mL/min — ABNORMAL LOW (ref >90)
Glucose, Bld: 89 mg/dL (ref 70–99)
Potassium: 4.5 mEq/L (ref 3.5–5.1)
Sodium: 141 mEq/L (ref 135–145)
Total Bilirubin: 1.4 mg/dL — ABNORMAL HIGH (ref 0.3–1.2)
Total Protein: 5.8 g/dL — ABNORMAL LOW (ref 6.0–8.3)

## 2013-07-27 MED ORDER — FUROSEMIDE 10 MG/ML IJ SOLN
40.0000 mg | Freq: Once | INTRAMUSCULAR | Status: AC
  Administered 2013-07-27: 09:00:00 40 mg via INTRAVENOUS
  Filled 2013-07-27: qty 4

## 2013-07-27 NOTE — Progress Notes (Signed)
When viewing patient's telemetry strips, I noticed that patient's QTC was 0.50. After assessing a second strip at 1409, the QTC was found to be 0.41. I will continue to monitor and assess QTC periodically and pass on to oncoming RN.

## 2013-07-27 NOTE — Progress Notes (Signed)
IP PROGRESS NOTE  Subjective:   No specific complaint this morning. She denies nausea and abdominal pain.  Objective: Vital signs in last 24 hours: Blood pressure 160/61, pulse 87, temperature 98.4 F (36.9 C), temperature source Oral, resp. rate 18, height 5\' 3"  (1.6 m), weight 219 lb 2.2 oz (99.4 kg), SpO2 100.00%.  Intake/Output from previous day: 10/22 0701 - 10/23 0700 In: 2984.5 [P.O.:837; I.V.:2147.5] Out: -   Physical Exam:  Abdomen: Soft, right upper abdomen biliary drain with a gauze dressing Vascular:? Bilateral leg edema   Lab Results:  Recent Labs  07/26/13 0503 07/27/13 0447  WBC 14.0* 15.7*  HGB 8.9* 8.3*  HCT 26.1* 24.9*  PLT 336 339    BMET  Recent Labs  07/26/13 0503 07/27/13 0447  NA 139 141  K 4.9 4.5  CL 114* 117*  CO2 12* 13*  GLUCOSE 128* 89  BUN 82* 50*  CREATININE 2.46* 1.39*  CALCIUM 8.6 8.6    Studies/Results: Dg Chest Port 1v Same Day  07/27/2013   CLINICAL DATA:  Wheezing, short of breath  EXAM: PORTABLE CHEST - 1 VIEW SAME DAY  COMPARISON:  Prior chest x-ray 07/24/2013  FINDINGS: Minimally increased central airway thickening and peribronchial cuffing. No focal airspace consolidation, or pleural effusion. No pneumothorax. No pulmonary edema. Stable cardiac and mediastinal contours. Atherosclerotic and tortuous thoracic aorta. Oral contrast material noted in the splenic flexure of the colon. Degenerative changes noted in both acromioclavicular joints and at the insertions of the rotator cuff tendons.  IMPRESSION: Slightly increased central airway thickening/ peribronchial cuffing may represent viral respiratory infection, or an inflammatory process such as COPD or asthma.   Electronically Signed   By: Malachy Moan M.D.   On: 07/27/2013 08:09    Medications: I have reviewed the patient's current medications.  Assessment/Plan:  1. Adenocarcinoma of the pancreas-borderline resectable disease with the plan to begin neoadjuvant  Xeloda/radiation  2. Obstructive jaundice secondary to #1, status post placement of a biliary stent, the bilirubin is mildly elevated  3. Acute renal failure-improved with intravenous hydration  4. Anorexia/malnutrition secondary to pancreas cancer  5. Anemia-? Secondary to chronic disease/malnutrition and polyphlebotomy  The acute renal failure has improved. However she continues to have a poor performance status.  Recommendations:  1. Continue management of renal failure/volume per Dr. Timothy Lasso  2. Hold Xeloda and radiation until the creatinine normalizes and her performance status has improved  3. increase diet/ambulation as tolerated  Please call oncology as needed. Dr. Myna Hidalgo will return on 07/31/2013   LOS: 4 days   Wahneta Derocher  07/27/2013, 1:25 PM

## 2013-07-27 NOTE — Progress Notes (Signed)
Patient started having shortness of breath while ambulating to bathroom during the shift with expiratory wheezing.  There is an increase in her weight and I notified Dr. Clelia Croft who is on call for Dr. Timothy Lasso this morning. Will pass information to day shift nurse and will continue to monitor.  Carolyn Amble, RN

## 2013-07-27 NOTE — Progress Notes (Signed)
Subjective: BPs up last night. Increased SOB and Wheezing. Had some pain meds. The Hydralazine was not given. She has had lots of IVFs and Cr better. She has gained lots of water weight. i believe we overshot. IVF are off and Lasix ordered. Mentally better last night. S/P BMs. Appetite picked up some yesterday and doing the resource breezes.  Ate one decent meal.  Objective: Vital signs in last 24 hours: Temp:  [98.2 F (36.8 C)-98.7 F (37.1 C)] 98.4 F (36.9 C) (10/23 0538) Pulse Rate:  [83-89] 87 (10/23 0538) Resp:  [18-20] 18 (10/23 0538) BP: (153-196)/(41-62) 160/61 mmHg (10/23 0538) SpO2:  [96 %-100 %] 100 % (10/23 0538) Weight:  [99.4 kg (219 lb 2.2 oz)-99.9 kg (220 lb 3.8 oz)] 99.4 kg (219 lb 2.2 oz) (10/23 0538) Weight change:  Last BM Date: 07/26/13  CBG (last 3)  No results found for this basename: GLUCAP,  in the last 72 hours  Intake/Output from previous day:  Intake/Output Summary (Last 24 hours) at 07/27/13 0742 Last data filed at 07/27/13 0319  Gross per 24 hour  Intake 2984.5 ml  Output      0 ml  Net 2984.5 ml   10/22 0701 - 10/23 0700 In: 2984.5 [P.O.:837; I.V.:2147.5] Out: -    Physical Exam  General appearance: Better than expected. Mild tachypnea. A and O Eyes: no scleral icterus Throat: oropharynx moist without erythema Resp: CTA.  Mild tachypnea. Cardio: Reg c known murmur GI: softer, non-tender; bowel sounds fine.  Perc drainage catheter in place = CDI.  Now clamped. Extremities: no clubbing or cyanosis.  Some edema.  SCDs  Lab Results:  Recent Labs  07/26/13 0503 07/27/13 0447  NA 139 141  K 4.9 4.5  CL 114* 117*  CO2 12* 13*  GLUCOSE 128* 89  BUN 82* 50*  CREATININE 2.46* 1.39*  CALCIUM 8.6 8.6     Recent Labs  07/26/13 0503 07/27/13 0447  AST 76* 72*  ALT 134* 125*  ALKPHOS 247* 289*  BILITOT 1.5* 1.4*  PROT 6.1 5.8*  ALBUMIN 2.3* 2.3*     Recent Labs  07/26/13 0503 07/27/13 0447  WBC 14.0* 15.7*   HGB 8.9* 8.3*  HCT 26.1* 24.9*  MCV 81.3 81.4  PLT 336 339    Lab Results  Component Value Date   INR 1.24 07/24/2013   INR 0.94 07/02/2013    No results found for this basename: CKTOTAL, CKMB, CKMBINDEX, TROPONINI,  in the last 72 hours  No results found for this basename: TSH, T4TOTAL, FREET3, T3FREE, THYROIDAB,  in the last 72 hours  No results found for this basename: VITAMINB12, FOLATE, FERRITIN, TIBC, IRON, RETICCTPCT,  in the last 72 hours  Micro Results: Recent Results (from the past 240 hour(s))  URINE CULTURE     Status: None   Collection Time    07/23/13  6:31 PM      Result Value Range Status   Specimen Description URINE, CATHETERIZED   Final   Special Requests Normal   Final   Culture  Setup Time     Final   Value: 07/24/2013 00:17     Performed at [facility] Count     Final   Value: NO GROWTH     Performed at [facility]   Culture     Final   Value: NO GROWTH     Performed at [facility]   Report Status 07/24/2013 FINAL   Final  Studies/Results: No results found.   Medications: Scheduled: . docusate sodium  200 mg Oral Daily  . feeding supplement (ENSURE)  1 Container Oral Q24H  . feeding supplement (RESOURCE BREEZE)  1 Container Oral BID WC  . furosemide  40 mg Intravenous Once  . lactose free nutrition  237 mL Oral BID BM  . lipase/protease/amylase  1 capsule Oral TID AC  . megestrol  400 mg Oral BID  . pantoprazole  40 mg Oral Daily   Continuous:     Assessment/Plan: Principal Problem:   Acute renal failure Active Problems:   Essential hypertension   Obstructive jaundice   Acute pancreatitis   Protein-calorie malnutrition, severe   Pancreatic cancer   Malignant tumor head pancreas  ARF - continue IVFs as Cr has gone from 8.11 - 8.01 - 7.26 - 5.51 - 2.46 -1.39.  She now has Vol overload as we have overshot.  Lasix ordered.  IVF stopped.  Still acidotic c Bicarb 13.  Continue to follow.    Ob  Jaundice c Perc drain in place - Non-Contrast CT looked better than expected c results c/w - Biliary drainage catheter in satisfactory position with decompression of the bile ducts.  Multiple liver lesions compatible with cysts.  No evidence of pancreatitis. Pancreatic head mass not well seen on the current study due to artifact from barium and lack of intravenous contrast. Appreciate IR, GI, Onc, and Surgery input.  Drain is clamped and doing well. T Bili stable. Megace added.  Transaminitis and T Bili stable Leukocytosis - being followed c no evidence of underlying infection.   Borderline resectable adenocarcinoma of the pancreas - She is set to start Intensity Modulated Radiotherapy (IMRT) and will receive chemotherapy during the course of radiation treatment. She already started low dose Xeloda - not currently getting. I believe radiation was to start next week.  I talked c Dr Mitzi Hansen and if we can get her stabilized enough we will explore the possibility to start XRT while here in the hospital. The ultimate goal is to get her to shrink the tumor, have her functional and then work on Resection/Whipple. I have brought up [facility] she/family is thinking @ this.  If we cannot make any progress quickly I am concerned that we will have no choice but to entertain [facility].  I discussed TNA or PEJ tube feedings and GI wants [place] to do PEJ via IR and Gen Surg wants [place] to start TNA - Until the ARF is resolved I will be blocked in getting a PICC line to start the foods.  Will decide in next day or so.   Severe Protein Calorie malnutrition - She is in desperate need of Calories. Consider J Tube vrs TNA - see above. Surprisingly her pre-albumin is 20.2 - I expected lower. Continue liberalized her diet. Nutrition saw her and recommended: Provide Boost Plus BID  Provide Ensure Pudding and Magic Cup once daily each   AFTT - Worsening. PT/OT/CW.  Worsening Anemia - Follow CBC for needs of transfusion.   No Obvious bleeding at this time. Hbg now 8.3.  Some Dilutional?  Ab Pain/N/V - Antiemetics prn  DVT Proph - Squeezers. Hold Lovenox c the renal failure.   Pancreatitis based on Lipase 192-204 but no specific Acute Sxs.  Dr Myna Hidalgo started her on Creon as outpt and hard to know if it was helping. CT was (-).    Dondra Spry her daughter: [phone]   HTN - BP up and needs diuresis.Marland Kitchen  GERD - change to PO PPI. Nexium at home   Today - off diuretics.  Diurese.  Watch Cr.  Will get in contact c Dr Mitzi Hansen and see if he wants to start XRT.   She is eating and drinking enough that when stable we can d/c and follow BUN/CR and weight c Sxs as outpt to see if calorie supplementation needed.   Before PEG/TNA considered lets see how much she can force in as she is trying.   LOS: 4 days   Kalel Harty M 07/27/2013, 7:42 AM

## 2013-07-27 NOTE — Progress Notes (Signed)
  Subjective: Pt ok, denies pain.  Objective: Vital signs in last 24 hours: Temp:  [98.3 F (36.8 C)-98.7 F (37.1 C)] 98.3 F (36.8 C) (10/23 1510) Pulse Rate:  [77-89] 77 (10/23 1510) Resp:  [16-18] 16 (10/23 1510) BP: (154-196)/(41-62) 154/48 mmHg (10/23 1510) SpO2:  [96 %-100 %] 100 % (10/23 0538) Weight:  [219 lb 2.2 oz (99.4 kg)-220 lb 3.8 oz (99.9 kg)] 219 lb 2.2 oz (99.4 kg) (10/23 0538) Last BM Date: 07/27/13  Biliary drain intact and capped, site NT  Lab Results:   Recent Labs  07/26/13 0503 07/27/13 0447  WBC 14.0* 15.7*  HGB 8.9* 8.3*  HCT 26.1* 24.9*  PLT 336 339   BMET  Recent Labs  07/26/13 0503 07/27/13 0447  NA 139 141  K 4.9 4.5  CL 114* 117*  CO2 12* 13*  GLUCOSE 128* 89  BUN 82* 50*  CREATININE 2.46* 1.39*  CALCIUM 8.6 8.6   PT/INR No results found for this basename: LABPROT, INR,  in the last 72 hours ABG No results found for this basename: PHART, PCO2, PO2, HCO3,  in the last 72 hours  Studies/Results: Dg Chest Port 1v Same Day  07/27/2013   CLINICAL DATA:  Wheezing, short of breath  EXAM: PORTABLE CHEST - 1 VIEW SAME DAY  COMPARISON:  Prior chest x-ray 07/24/2013  FINDINGS: Minimally increased central airway thickening and peribronchial cuffing. No focal airspace consolidation, or pleural effusion. No pneumothorax. No pulmonary edema. Stable cardiac and mediastinal contours. Atherosclerotic and tortuous thoracic aorta. Oral contrast material noted in the splenic flexure of the colon. Degenerative changes noted in both acromioclavicular joints and at the insertions of the rotator cuff tendons.  IMPRESSION: Slightly increased central airway thickening/ peribronchial cuffing may represent viral respiratory infection, or an inflammatory process such as COPD or asthma.   Electronically Signed   By: Malachy Moan M.D.   On: 07/27/2013 08:09    Anti-infectives: Anti-infectives   None      Assessment/Plan: s/p PTC with I/E biliary  drain secondary to obst jaundice from panc ca 10/2; cont current tx; Bili stable at 1.4 WBC trend slightly up  other plans as per [facility]  LOS: 4 days    Brayton El 07/27/2013

## 2013-07-27 NOTE — Progress Notes (Signed)
Patient ID: Carolyn [name], female   DOB: Oct 18, 1935, 77 y.o.   MRN: [id number]    Subjective: Pt feeling "really bad" this AM.  Unable to tell me how she feels badly.   She did eat a little better yesterday.    Objective: Vital signs in last 24 hours: Temp:  [98.2 F (36.8 C)-98.7 F (37.1 C)] 98.4 F (36.9 C) (10/23 0538) Pulse Rate:  [83-89] 87 (10/23 0538) Resp:  [18-20] 18 (10/23 0538) BP: (153-196)/(41-62) 160/61 mmHg (10/23 0538) SpO2:  [96 %-100 %] 100 % (10/23 0538) Weight:  [219 lb 2.2 oz (99.4 kg)-220 lb 3.8 oz (99.9 kg)] 219 lb 2.2 oz (99.4 kg) (10/23 0538) Last BM Date: 07/26/13  Intake/Output from previous day: 10/22 0701 - 10/23 0700 In: 2984.5 [P.O.:837; I.V.:2147.5] Out: -  Intake/Output this shift:    General appearance: sleepy GI: soft, non distended, tender at drain site Extremities: extremities normal, atraumatic, no cyanosis or edema  Lab Results:   Recent Labs  07/26/13 0503 07/27/13 0447  WBC 14.0* 15.7*  HGB 8.9* 8.3*  HCT 26.1* 24.9*  PLT 336 339   BMET  Recent Labs  07/26/13 0503 07/27/13 0447  NA 139 141  K 4.9 4.5  CL 114* 117*  CO2 12* 13*  GLUCOSE 128* 89  BUN 82* 50*  CREATININE 2.46* 1.39*  CALCIUM 8.6 8.6   PT/INR No results found for this basename: LABPROT, INR,  in the last 72 hours ABG No results found for this basename: PHART, PCO2, PO2, HCO3,  in the last 72 hours  Studies/Results: Dg Chest Port 1v Same Day  07/27/2013   CLINICAL DATA:  Wheezing, short of breath  EXAM: PORTABLE CHEST - 1 VIEW SAME DAY  COMPARISON:  Prior chest x-ray 07/24/2013  FINDINGS: Minimally increased central airway thickening and peribronchial cuffing. No focal airspace consolidation, or pleural effusion. No pneumothorax. No pulmonary edema. Stable cardiac and mediastinal contours. Atherosclerotic and tortuous thoracic aorta. Oral contrast material noted in the splenic flexure of the colon. Degenerative changes noted in both acromioclavicular  joints and at the insertions of the rotator cuff tendons.  IMPRESSION: Slightly increased central airway thickening/ peribronchial cuffing may represent viral respiratory infection, or an inflammatory process such as COPD or asthma.   Electronically Signed   By: Malachy Moan M.D.   On: 07/27/2013 08:09    Anti-infectives: Anti-infectives   None      Assessment/Plan: s/p * No surgery found * Fluids per Dr. Timothy Lasso for ARF Severe protein calorie malnutrition- Nutritional supplementation , megace for appetite stimulation  Continues to improve acute renal failure.  Now a little SOB and wheezing.  Dr. Timothy Lasso giving lasix.    We will see if she can tolerate neoadjuvant chemotherapy.      LOS: 4 days    [facility] 07/27/2013

## 2013-07-28 LAB — CBC
HCT: 24.5 % — ABNORMAL LOW (ref 36.0–46.0)
Hemoglobin: 8.4 g/dL — ABNORMAL LOW (ref 12.0–15.0)
MCV: 81.7 fL (ref 78.0–100.0)
Platelets: 295 10*3/uL (ref 150–400)
RBC: 3 MIL/uL — ABNORMAL LOW (ref 3.87–5.11)
RDW: 18.3 % — ABNORMAL HIGH (ref 11.5–15.5)
WBC: 15.6 10*3/uL — ABNORMAL HIGH (ref 4.0–10.5)

## 2013-07-28 LAB — COMPREHENSIVE METABOLIC PANEL
Albumin: 2.2 g/dL — ABNORMAL LOW (ref 3.5–5.2)
Alkaline Phosphatase: 362 U/L — ABNORMAL HIGH (ref 39–117)
BUN: 29 mg/dL — ABNORMAL HIGH (ref 6–23)
CO2: 15 mEq/L — ABNORMAL LOW (ref 19–32)
Chloride: 111 mEq/L (ref 96–112)
Creatinine, Ser: 1.12 mg/dL — ABNORMAL HIGH (ref 0.50–1.10)
GFR calc Af Amer: 54 mL/min — ABNORMAL LOW (ref >90)
GFR calc non Af Amer: 47 mL/min — ABNORMAL LOW (ref >90)
Glucose, Bld: 108 mg/dL — ABNORMAL HIGH (ref 70–99)
Total Bilirubin: 1.4 mg/dL — ABNORMAL HIGH (ref 0.3–1.2)

## 2013-07-28 MED ORDER — MEGESTROL ACETATE 400 MG/10ML PO SUSP: 400.0000 mg | Freq: Two times a day (BID) | ORAL | Status: DC

## 2013-07-28 MED ORDER — BISACODYL 10 MG RE SUPP: 10.0000 mg | Freq: Every day | RECTAL | Status: DC | PRN

## 2013-07-28 MED ORDER — ENSURE PUDDING PO PUDG: 1.0000 | ORAL | Status: DC

## 2013-07-28 MED ORDER — ACETAMINOPHEN 325 MG PO TABS: 325.0000 mg | ORAL_TABLET | Freq: Four times a day (QID) | ORAL | Status: DC | PRN

## 2013-07-28 MED ORDER — HYDROCORTISONE 1 % EX CREA
TOPICAL_CREAM | Freq: Two times a day (BID) | CUTANEOUS | Status: DC
  Administered 2013-07-28: 11:00:00 via TOPICAL
  Filled 2013-07-28: qty 28

## 2013-07-28 MED ORDER — BOOST / RESOURCE BREEZE PO LIQD: 1.0000 | Freq: Two times a day (BID) | ORAL | Status: DC

## 2013-07-28 MED ORDER — BOOST PLUS PO LIQD: 237.0000 mL | Freq: Two times a day (BID) | ORAL | Status: DC

## 2013-07-28 MED ORDER — HYDROCORTISONE 1 % EX CREA: TOPICAL_CREAM | CUTANEOUS | Status: DC

## 2013-07-28 NOTE — Progress Notes (Signed)
Subjective: Bps up but less high. S/P IV Lasix. IVF off. QTc comment noted but she is not on much meds? Issues c Constipation/Hemorrhoids/Diarrhea noted. Appetite and fluid consumption picked.  Objective: Vital signs in last 24 hours: Temp:  [98.3 F (36.8 C)-98.6 F (37 C)] 98.6 F (37 C) (10/24 0631) Pulse Rate:  [77-79] 77 (10/24 0631) Resp:  [16] 16 (10/24 0631) BP: (150-157)/(47-50) 150/47 mmHg (10/24 0631) SpO2:  [100 %] 100 % (10/24 0631) Weight:  [95.9 kg (211 lb 6.7 oz)] 95.9 kg (211 lb 6.7 oz) (10/24 0601) Weight change: -4 kg (-8 lb 13.1 oz) Last BM Date: 07/28/13  CBG (last 3)  No results found for this basename: GLUCAP,  in the last 72 hours  Intake/Output from previous day:  Intake/Output Summary (Last 24 hours) at 07/28/13 0914 Last data filed at 07/28/13 1610  Gross per 24 hour  Intake    480 ml  Output   2000 ml  Net  -1520 ml   10/23 0701 - 10/24 0700 In: 600 [P.O.:600] Out: 2200 [Urine:2200]   Physical Exam  General appearance: Better than expected.  A and O Eyes: no scleral icterus Throat: oropharynx moist without erythema Resp: CTA.  Mild tachypnea. Cardio: Reg c known murmur GI: softer, non-tender; bowel sounds fine.  Perc drainage catheter in place = CDI.  Now clamped. Extremities: no clubbing or cyanosis.  Some edema.  SCDs  Lab Results:  Recent Labs  07/27/13 0447 07/28/13 0558  NA 141 138  K 4.5 3.9  CL 117* 111  CO2 13* 15*  GLUCOSE 89 108*  BUN 50* 29*  CREATININE 1.39* 1.12*  CALCIUM 8.6 8.7     Recent Labs  07/27/13 0447 07/28/13 0558  AST 72* 68*  ALT 125* 119*  ALKPHOS 289* 362*  BILITOT 1.4* 1.4*  PROT 5.8* 5.9*  ALBUMIN 2.3* 2.2*     Recent Labs  07/27/13 0447 07/28/13 0558  WBC 15.7* 15.6*  HGB 8.3* 8.4*  HCT 24.9* 24.5*  MCV 81.4 81.7  PLT 339 295    Lab Results  Component Value Date   INR 1.24 07/24/2013   INR 0.94 07/02/2013    No results found for this basename: CKTOTAL, CKMB,  CKMBINDEX, TROPONINI,  in the last 72 hours  No results found for this basename: TSH, T4TOTAL, FREET3, T3FREE, THYROIDAB,  in the last 72 hours  No results found for this basename: VITAMINB12, FOLATE, FERRITIN, TIBC, IRON, RETICCTPCT,  in the last 72 hours  Micro Results: Recent Results (from the past 240 hour(s))  URINE CULTURE     Status: None   Collection Time    07/23/13  6:31 PM      Result Value Range Status   Specimen Description URINE, CATHETERIZED   Final   Special Requests Normal   Final   Culture  Setup Time     Final   Value: 07/24/2013 00:17     Performed at [facility] Count     Final   Value: NO GROWTH     Performed at [facility]   Culture     Final   Value: NO GROWTH     Performed at [facility]   Report Status 07/24/2013 FINAL   Final     Studies/Results: Dg Chest Port 1v Same Day  07/27/2013   CLINICAL DATA:  Wheezing, short of breath  EXAM: PORTABLE CHEST - 1 VIEW SAME DAY  COMPARISON:  Prior chest x-ray 07/24/2013  FINDINGS: Minimally increased central airway thickening and peribronchial cuffing. No focal airspace consolidation, or pleural effusion. No pneumothorax. No pulmonary edema. Stable cardiac and mediastinal contours. Atherosclerotic and tortuous thoracic aorta. Oral contrast material noted in the splenic flexure of the colon. Degenerative changes noted in both acromioclavicular joints and at the insertions of the rotator cuff tendons.  IMPRESSION: Slightly increased central airway thickening/ peribronchial cuffing may represent viral respiratory infection, or an inflammatory process such as COPD or asthma.   Electronically Signed   By: Malachy Moan M.D.   On: 07/27/2013 08:09     Medications: Scheduled: . docusate sodium  200 mg Oral Daily  . feeding supplement (ENSURE)  1 Container Oral Q24H  . feeding supplement (RESOURCE BREEZE)  1 Container Oral BID WC  . hydrocortisone cream   Topical BID  . lactose  free nutrition  237 mL Oral BID BM  . lipase/protease/amylase  1 capsule Oral TID AC  . megestrol  400 mg Oral BID  . pantoprazole  40 mg Oral Daily   Continuous:     Assessment/Plan: Principal Problem:   Acute renal failure Active Problems:   Essential hypertension   Obstructive jaundice   Acute pancreatitis   Protein-calorie malnutrition, severe   Pancreatic cancer   Malignant tumor head pancreas  ARF - S/P IVFs as Cr has gone from 8.11 - 8.01 - 7.26 - 5.51 - 2.46 -1.39 - ? today.  Lasix given yesterday.  Still acidotic c Bicarb 13.  Continue to follow.    Ob Jaundice c Perc drain in place - Non-Contrast CT looked better than expected c results c/w - Biliary drainage catheter in satisfactory position with decompression of the bile ducts.  Multiple liver lesions compatible with cysts.  No evidence of pancreatitis. Pancreatic head mass not well seen on the current study due to artifact from barium and lack of intravenous contrast. Appreciate IR, GI, Onc, and Surgery input.  Drain is clamped and doing well. T Bili stable. Megace added.  Transaminitis and T Bili stable Leukocytosis - being followed c no evidence of underlying infection.   Borderline resectable adenocarcinoma of the pancreas - She is set to start Intensity Modulated Radiotherapy (IMRT) and will receive chemotherapy during the course of radiation treatment. She already started low dose Xeloda - not currently getting. I talked c Dr Mitzi Hansen and XRT to start Monday next week.  The ultimate goal is to get her to shrink the tumor, have her functional and then work on Resection/Whipple. I have brought up DNR and she/family is thinking @ this.  If we cannot make any progress quickly I am concerned that we will have no choice but to entertain [facility]ospice Palliative [facility].  I discussed TNA or PEJ tube feedings and GI wants [place] to do PEJ via IR and Gen Surg wants [place] to start TNA - These are on hold as we will watch and monitor caloric intake,  labs and weight.   Severe Protein Calorie malnutrition - PushCalories. Consider J Tube vrs TNA - see above. Surprisingly her pre-albumin is 20.2 - I expected lower. Continue liberalized her diet. Continue Boost BID  Provide Resource Breeze BID  Decrease Ensure Pudding to once daily  Discontinue Magic Cup  Encourage PO intake  AFTT -  PT/OT/CW.  Worsening Anemia - Follow CBC for needs of transfusion.  No Obvious bleeding at this time. Hbg now 8.3.  Some Dilutional?  Ab Pain/N/V - Antiemetics prn  DVT Proph - Squeezers. Hold Lovenox c the  renal failure.   Pancreatitis based on Lipase 192-204 but no specific Acute Sxs.  Dr Myna Hidalgo started her on Creon as outpt and hard to know if it was helping. CT was (-).  I will continue.  Dondra Spry her daughter: [phone]   HTN - BP up and S/P diuresis - can follow as outpatient.Marland Kitchen   GERD -  PO PPI. Nexium at home   Await labs and consider D/c today or tomorrow.   LOS: 5 days   Kayne Yuhas M 07/28/2013, 9:14 AM

## 2013-07-28 NOTE — Discharge Summary (Signed)
Physician Discharge Summary  DISCHARGE SUMMARY   Patient ID: CAMARYN LUMBERT MR#: [id number] DOB/AGE: 1936-03-23 77 y.o.   Attending Physician:Shadai Mcclane M  Patient's WUJ:WJXBJ,YNWG M, MD  Consults:Treatment Team:  Josph Macho, MD Willis Modena, MD Ladene Artist, MD**  Admit date: 07/23/2013 Discharge date: 07/28/2013  Discharge Diagnoses:  Principal Problem:   Acute renal failure Active Problems:   Essential hypertension   Obstructive jaundice   Acute pancreatitis   Protein-calorie malnutrition, severe   Pancreatic cancer   Malignant tumor head pancreas   Patient Active Problem List   Diagnosis Date Noted  . Acute renal failure 07/23/2013  . Malignant tumor head pancreas 07/19/2013  . Pancreatic cancer 07/12/2013  . Protein-calorie malnutrition, severe 07/03/2013  . Obstructive jaundice 07/02/2013  . Pancreatic mass 07/02/2013  . Abdominal pain 07/02/2013  . Pruritus 07/02/2013  . Acute pancreatitis 07/02/2013  . Essential hypertension 05/04/2013  . Hyperlipidemia 05/04/2013  . Multinodular goiter (nontoxic) 06/22/2012   Past Medical History  Diagnosis Date  . Hypertension   . Hyperlipidemia   . GERD (gastroesophageal reflux disease)   . Asthma   . Obstructive jaundice 06/2013  . Pancreatitis   . Arthritis   . Cancer   . Heart murmur   . Thyroid disease   . Pancreatic cancer 07/05/13    pancreatic head=adenocarcinoma    Discharged Condition: better   Discharge Medications:   Medication List    STOP taking these medications       diphenhydrAMINE 25 mg capsule  Commonly known as:  BENADRYL      TAKE these medications       acetaminophen 325 MG tablet  Commonly known as:  TYLENOL  Take 1 tablet (325 mg total) by mouth every 6 (six) hours as needed.     bisacodyl 10 MG suppository  Commonly known as:  DULCOLAX  Place 1 suppository (10 mg total) rectally daily as needed.     cholecalciferol 1000 UNITS tablet  Commonly known as:   VITAMIN D  Take 1,000 Units by mouth daily.     dronabinol 2.5 MG capsule  Commonly known as:  MARINOL  Take 1 pill 3 times a day.     DSS 100 MG Caps  Take 100 mg by mouth 2 (two) times daily.     esomeprazole 40 MG capsule  Commonly known as:  NEXIUM  Take 40 mg by mouth daily before breakfast.     feeding supplement (ENSURE COMPLETE) Liqd  Take 237 mLs by mouth 2 (two) times daily between meals as needed (Please offer to pt if eating <50% of meals).     feeding supplement (ENSURE) Pudg  Take 1 Container by mouth daily.     feeding supplement (RESOURCE BREEZE) Liqd  Take 1 Container by mouth 2 (two) times daily with breakfast and lunch.     lactose free nutrition Liqd  Take 237 mLs by mouth 2 (two) times daily between meals.     HYDROcodone-acetaminophen 5-325 MG per tablet  Commonly known as:  NORCO/VICODIN  Take 1 tablet by mouth every 6 (six) hours as needed for pain.     hydrocortisone cream 1 %  Apply topically 2 (two) times daily to hemorrhoids.     lipase/protease/amylase 95621 UNITS Cpep capsule  Commonly known as:  CREON-12/PANCREASE  Take 3 capsules by mouth 3 (three) times daily with meals.     megestrol 400 MG/10ML suspension  Commonly known as:  MEGACE  Take 10 mLs (400 mg total)  by mouth 2 (two) times daily.     metoCLOPramide 10 MG tablet  Commonly known as:  REGLAN  Take 1 tablet (10 mg total) by mouth 4 (four) times daily.     multivitamin with minerals Tabs tablet  Take 1 tablet by mouth daily.     ondansetron 8 MG tablet  Commonly known as:  ZOFRAN  Take 1 tablet (8 mg total) by mouth every 8 (eight) hours as needed for nausea.     polyethylene glycol packet  Commonly known as:  MIRALAX / GLYCOLAX  Take 17 g by mouth daily as needed.     prochlorperazine 25 MG suppository  Commonly known as:  COMPAZINE  Place 1 suppository (25 mg total) rectally every 12 (twelve) hours as needed for nausea.        Hospital Procedures: Ct Abdomen Wo  Contrast  07/24/2013   CLINICAL DATA:  Abdominal pain. Pancreatitis. Pancreatic carcinoma. Biliary drainage catheter. Elevated white blood count. Renal insufficiency.  EXAM: CT ABDOMEN WITHOUT CONTRAST  TECHNIQUE: Multidetector CT imaging of the abdomen was performed following the standard protocol without IV contrast.  COMPARISON:  CT abdomen 07/02/2013  FINDINGS: Interval placement of a biliary drainage catheter through the left lobe and into the common bile duct and into the duodenum. Bile ducts are now adequately decompressed. Multiple hepatic low-density lesions are stable may represent cysts. Pancreatic head mass is better seen on the prior study. This is partially obscured by streak artifact from barium in the colon.  Negative for bowel obstruction. No adenopathy. Atherosclerotic aorta. No free fluid. No adenopathy. Lung bases are clear.  IMPRESSION: Biliary drainage catheter in satisfactory position with decompression of the bile ducts.  Multiple liver lesions compatible with cysts.  No evidence of pancreatitis. Pancreatic head mass not well seen on the current study due to artifact from barium and lack of intravenous contrast.   Electronically Signed   By: Marlan Palau M.D.   On: 07/24/2013 00:12   X-ray Chest Pa And Lateral   07/24/2013   CLINICAL DATA:  Shortness of breath. Abdominal pain. Acute pancreatitis. History of pancreatic cancer.  EXAM: CHEST  2 VIEW  COMPARISON:  07/02/2013  FINDINGS: The heart size and mediastinal contours are within normal limits. Both lungs are clear. The visualized skeletal structures are unremarkable. Displacement of the trachea towards the right consistent with thyroid goiter. Postoperative changes in the cervical spine. Calcified and tortuous aorta. Degenerative changes in the spine and shoulders. No significant change since previous study.  IMPRESSION: No active cardiopulmonary disease.   Electronically Signed   By: Burman Nieves M.D.   On: 07/24/2013 00:11    Dg Chest 2 View  07/02/2013   CLINICAL DATA:  Fifteen. Hypertension.  Abdominal pain.  EXAM: CHEST  2 VIEW  COMPARISON:  03/06/2010  FINDINGS: The heart size and mediastinal contours are within normal limits. Both lungs are clear. Atherosclerotic calcification and mild tortuosity of thoracic aorta are stable. The visualized skeletal structures are unremarkable.  IMPRESSION: No active cardiopulmonary disease.   Electronically Signed   By: Myles Rosenthal   On: 07/02/2013 12:13   Ct Chest W Contrast  07/07/2013   CLINICAL DATA:  Weakness, shortness of breath. Recently diagnosed pancreatic mass.  EXAM: CT CHEST WITH CONTRAST  TECHNIQUE: Multidetector CT imaging of the chest was performed during intravenous contrast administration.  CONTRAST:  80mL OMNIPAQUE IOHEXOL 300 MG/ML  SOLN  COMPARISON:  Chest radiograph dated 07/02/2013  FINDINGS: Trace right pleural effusion with associated  mild dependent atelectasis at the right lung base.  Minimal subpleural nodularity in the anterior right upper lobe measuring up to 4 mm (series 3/ images 18 and 22). These findings are not worrisome for metastatic disease. Mild centrilobular emphysematous changes. No pneumothorax.  Thyroid is enlarged/nodular, better evaluated on prior thyroid ultrasound.  The heart is top-normal in size. No pericardial effusion. Coronary atherosclerosis. Atherosclerotic calcifications of the aortic arch. Ectasia of the descending thoracic aorta measuring up to 3.2 cm (series 2/ image 44).  No suspicious mediastinal, hilar, or axillary lymphadenopathy.  Visualized upper abdomen is notable for a percutaneous biliary drain but is otherwise unchanged from recent CT.  Degenerative changes of the visualized thoracolumbar spine.  IMPRESSION: No findings suspicious for metastatic disease in the chest.  Minimal subpleural nodularity in the anterior right upper lobe measuring up to 4 mm, likely benign. Given high risk for primary bronchogenic neoplasm, a single  follow-up CT chest is suggested in 12 months.  Trace right pleural effusion with associated mild dependent atelectasis.  This recommendation follows the consensus statement: Guidelines for Management of Small Pulmonary Nodules Detected on CT Scans: A Statement from the Fleischner Society as published in Radiology 2005; 237:395-400.   Electronically Signed   By: Charline Bills M.D.   On: 07/07/2013 10:14   Ct Abdomen Pelvis W Contrast  07/02/2013   CLINICAL DATA:  Jaundice. Abdominal pain and vomiting.  Dark urine.  EXAM: CT ABDOMEN AND PELVIS WITH CONTRAST  TECHNIQUE: Multidetector CT imaging of the abdomen and pelvis was performed using the standard protocol following bolus administration of intravenous contrast.  CONTRAST:  80mL OMNIPAQUE IOHEXOL 300 MG/ML  SOLN  COMPARISON:  06/13/2010  FINDINGS: Prior cholecystectomy again noted as well as multiple small hepatic cysts, however there is diffuse biliary and pancreatic ductal dilatation which is new since previous study. A heterogeneous mass is seen involving the pancreatic head and uncinate process which measures 4.1 x 3.8 x 3.2 and is highly suspicious for pancreatic carcinoma.  No evidence of peripancreatic lymphadenopathy or inflammatory changes. No soft tissue masses or lymphadenopathy seen elsewhere within the abdomen or pelvis.  Prior hysterectomy noted. Adnexal regions are unremarkable. Diverticulosis is seen mainly involving the sigmoid colon, however there is no evidence of diverticulitis. Normal appendix is visualized. No evidence of dilated bowel loops. No suspicious bone lesions identified.  IMPRESSION: Diffuse biliary and pancreatic ductal dilatation, with 4 cm mass in the pancreatic head and uncinate process, highly suspicious for pancreatic carcinoma. Consider ERCP or MRCP for further evaluation.  No evidence of metastatic disease. Stable hepatic cysts.  Diverticulosis. No radiographic evidence of diverticulitis.   Electronically Signed   By:  Myles Rosenthal   On: 07/02/2013 14:32   Ir Biliary Drain Catheter Placement  07/06/2013   CLINICAL DATA:  Pancreatic head mass  EXAM: PERCUTANEOUS TRANSHEPATIC CHOLANGIOGRAM; IR ULTRASOUND GUIDANCE TISSUE ABLATION; NEPHROSTOMY REMOVAL  MEDICATIONS AND MEDICAL HISTORY: Versed 4.0 mg, Fentanyl 2 1 mcg.  Additional Medications: Zosyn.  ANESTHESIA/SEDATION: Moderate sedation time: 20 minutes  CONTRAST:  50 cc Omnipaque 300  FLUOROSCOPY TIME:  5 min and 56 seconds.  PROCEDURE: The procedure, risks, benefits, and alternatives were explained to the patient. Questions regarding the procedure were encouraged and answered. The patient understands and consents to the procedure.  The epigastrium was prepped with Betadine in a sterile fashion, and a sterile drape was applied covering the operative field. A sterile gown and sterile gloves were used for the procedure.  Under sonographic guidance, a 22  gauge Chiba needle was inserted into a peripheral biliary duct within the lateral segment of the left lobe. After aspirating bile, contrast was injected opacifying the biliary tree. The needle was removed over a 018 wire which was upsized to the 3 J included in the Accustick set. A copy catheter was advanced over the J-wire was exchanged for a Glidewire. The Kumpe be was advanced over the glidewire into the duodenum across the common bile duct obstruction. A copy was exchanged for a 10 dilator then a 10 [place] biliary drain. This was looped in the duodenum, string fixed common and sewn to the skin. Contrast was injected.  COMPLICATIONS: None  FINDINGS: Imaging documents needle access into the biliary tree within the lateral segment of the left lobe of the liver. Subsequent images demonstrate placement of a left internal external biliary drain with its tip coiled in the duodenum.  IMPRESSION: Successful left 10 French internal external biliary drainage. This will be capped after 24-48 hr. She will follow-up with general surgery  regarding consultation of resection.   Electronically Signed   By: Maryclare Bean M.D.   On: 07/06/2013 14:15   Ir Ptc  07/06/2013   CLINICAL DATA:  Pancreatic head mass  EXAM: PERCUTANEOUS TRANSHEPATIC CHOLANGIOGRAM; IR ULTRASOUND GUIDANCE TISSUE ABLATION; NEPHROSTOMY REMOVAL  MEDICATIONS AND MEDICAL HISTORY: Versed 4.0 mg, Fentanyl 2 1 mcg.  Additional Medications: Zosyn.  ANESTHESIA/SEDATION: Moderate sedation time: 20 minutes  CONTRAST:  50 cc Omnipaque 300  FLUOROSCOPY TIME:  5 min and 56 seconds.  PROCEDURE: The procedure, risks, benefits, and alternatives were explained to the patient. Questions regarding the procedure were encouraged and answered. The patient understands and consents to the procedure.  The epigastrium was prepped with Betadine in a sterile fashion, and a sterile drape was applied covering the operative field. A sterile gown and sterile gloves were used for the procedure.  Under sonographic guidance, a 22 gauge Chiba needle was inserted into a peripheral biliary duct within the lateral segment of the left lobe. After aspirating bile, contrast was injected opacifying the biliary tree. The needle was removed over a 018 wire which was upsized to the 3 J included in the Accustick set. A copy catheter was advanced over the J-wire was exchanged for a Glidewire. The Kumpe be was advanced over the glidewire into the duodenum across the common bile duct obstruction. A copy was exchanged for a 10 dilator then a 10 [place] biliary drain. This was looped in the duodenum, string fixed common and sewn to the skin. Contrast was injected.  COMPLICATIONS: None  FINDINGS: Imaging documents needle access into the biliary tree within the lateral segment of the left lobe of the liver. Subsequent images demonstrate placement of a left internal external biliary drain with its tip coiled in the duodenum.  IMPRESSION: Successful left 10 French internal external biliary drainage. This will be capped after 24-48 hr. She  will follow-up with general surgery regarding consultation of resection.   Electronically Signed   By: Maryclare Bean M.D.   On: 07/06/2013 14:15   Ir US Guide Bx Asp/drain  07/06/2013   CLINICAL DATA:  Pancreatic head mass  EXAM: PERCUTANEOUS TRANSHEPATIC CHOLANGIOGRAM; IR ULTRASOUND GUIDANCE TISSUE ABLATION; NEPHROSTOMY REMOVAL  MEDICATIONS AND MEDICAL HISTORY: Versed 4.0 mg, Fentanyl 2 1 mcg.  Additional Medications: Zosyn.  ANESTHESIA/SEDATION: Moderate sedation time: 20 minutes  CONTRAST:  50 cc Omnipaque 300  FLUOROSCOPY TIME:  5 min and 56 seconds.  PROCEDURE: The procedure, risks, benefits, and alternatives were explained  to the patient. Questions regarding the procedure were encouraged and answered. The patient understands and consents to the procedure.  The epigastrium was prepped with Betadine in a sterile fashion, and a sterile drape was applied covering the operative field. A sterile gown and sterile gloves were used for the procedure.  Under sonographic guidance, a 22 gauge Chiba needle was inserted into a peripheral biliary duct within the lateral segment of the left lobe. After aspirating bile, contrast was injected opacifying the biliary tree. The needle was removed over a 018 wire which was upsized to the 3 J included in the Accustick set. A copy catheter was advanced over the J-wire was exchanged for a Glidewire. The Kumpe be was advanced over the glidewire into the duodenum across the common bile duct obstruction. A copy was exchanged for a 10 dilator then a 10 [place] biliary drain. This was looped in the duodenum, string fixed common and sewn to the skin. Contrast was injected.  COMPLICATIONS: None  FINDINGS: Imaging documents needle access into the biliary tree within the lateral segment of the left lobe of the liver. Subsequent images demonstrate placement of a left internal external biliary drain with its tip coiled in the duodenum.  IMPRESSION: Successful left 10 French internal external  biliary drainage. This will be capped after 24-48 hr. She will follow-up with general surgery regarding consultation of resection.   Electronically Signed   By: Maryclare Bean M.D.   On: 07/06/2013 14:15   Dg Chest Port 1v Same Day  07/27/2013   CLINICAL DATA:  Wheezing, short of breath  EXAM: PORTABLE CHEST - 1 VIEW SAME DAY  COMPARISON:  Prior chest x-ray 07/24/2013  FINDINGS: Minimally increased central airway thickening and peribronchial cuffing. No focal airspace consolidation, or pleural effusion. No pneumothorax. No pulmonary edema. Stable cardiac and mediastinal contours. Atherosclerotic and tortuous thoracic aorta. Oral contrast material noted in the splenic flexure of the colon. Degenerative changes noted in both acromioclavicular joints and at the insertions of the rotator cuff tendons.  IMPRESSION: Slightly increased central airway thickening/ peribronchial cuffing may represent viral respiratory infection, or an inflammatory process such as COPD or asthma.   Electronically Signed   By: Malachy Moan M.D.   On: 07/27/2013 08:09   Dg Kayleen Memos W/kub  07/20/2013   CLINICAL DATA:  Pancreatic adenocarcinoma.  Difficulty eating.  EXAM: UPPER GI SERIES W/ KUB  TECHNIQUE: After obtaining a scout radiograph a routine upper GI series was performed using thin barium  COMPARISON:  CT abdomen and pelvis 07/02/2013.  FLUOROSCOPY TIME:  1 min, 38 seconds.  FINDINGS: Scout image demonstrates a biliary drain in place. Cholecystectomy clips are noted. The patient has a large stool burden throughout the colon. The bowel gas pattern is nonobstructive. No focal bony lesion is seen with convex right scoliosis and lower lumbar degenerative change noted.  The esophagus is patulous with a poor primary esophageal stripping wave. There is stasis of barium within the esophagus. Gastroesophageal reflux is also visualized. No stricture or mass is seen. The stomach, duodenum bulb and sweep are unremarkable in appearance.   IMPRESSION: No acute finding.  Presbyesophagus.  Gastroesophageal reflux.  Large stool burden.   Electronically Signed   By: Drusilla Kanner M.D.   On: 07/20/2013 10:42   Dg C-arm 1-60 Min-no Report  07/05/2013   CLINICAL DATA: Bile duct obstrustion, pancreatic ca   C-ARM 1-60 MINUTES  Fluoroscopy was utilized by the requesting physician.  No radiographic  interpretation.    Dg  C-arm 61-120 Min-no Report  07/04/2013   CLINICAL DATA: pancreatic mass   C-ARM 61-120 MINUTES  Fluoroscopy was utilized by the requesting physician.  No radiographic  interpretation.     History of Present Illness:  35 F c New Dx of Pancreatic Cancer - considered Early stage but 4+ cm and in a bad location. It caused her last admission for Obstructive Jaundice requiring a Percutaneous drainage catheter. Since D/c she saw me and I did labs - her LFTs and T Bili were better. Her Cr started going up. I adjusted her meds and was to bring her back quickly to follow up on the labs. She was given IVF's by one of her other doctors. She has been seen by Dr Cleotilde Neer, Dr Myna Hidalgo, Nutritionist and Rad Onc-Dr [place]. She is set to start Intensity Modulated Radiotherapy (IMRT) and will receive chemotherapy during the course of radiation treatment. She had a UGI series on 10/16 and it showed No acute finding. Presbyesophagus. Gastroesophageal reflux. Large stool burden. She has had poor PO Intake since D/c and much less in the last 2-3 days prior to admit. She continued to have N/V. She has lost weight. She had global weakness and was tired. No Distress. BP surprisingly fine. No confusion. A little jittery. Her perc drain had been putting out 500-750 per day and is down to 400-500 and accounts for some of her insensible losses. She denied CP/SOB or other Sxs x being cold. Many blankets are on. No fever. No Pus or drainage from the perc site. Some Epigastric pains noted. She came to ED 07/23/13 and noted to be in ARF c cr > 8+ and admitted for  IVF's. In ED she received 2 doses MSO4 4 mg Zofran and 2000 L NS bolus then 200 ccs per hr. I made her NPO x sips/chips and held all oral meds.    Hospital Course: Admitted c ARF due to [facility] and insensible losses.  She was aggressively hydrated and her Cr has gone from 8.11 - 8.01 - 7.26 - 5.51 - 2.46 -1.39 - 1.12.  We overshot with the fluids and she got Hypertensive and mild Vol Overloaded and required one dose IV Lasix.  Her Uremia improved and Bicarb improved from 12-13 to 15.  Will need labs next week.  She is encouraged to eat well and drink well.  Ob Jaundice c Perc drain in place - Non-Contrast CT looked better than expected c results c/w - Biliary drainage catheter in satisfactory position with decompression of the bile ducts. Multiple liver lesions compatible with cysts. No evidence of pancreatitis. Pancreatic head mass not well seen on the study due to artifact from barium and lack of intravenous contrast. Appreciate IR, GI, Onc, and Surgery input. Drain is clamped and doing well. T Bili stable. Megace added. Dr Donell Beers saw pt today and recommended:  "Severe protein calorie malnutrition- Nutritional supplementation , megace for appetite stimulation. She likes resource breeze.  We will see if she can tolerate neoadjuvant chemotherapy, otherwise discussion would be feeding tube or TNA vs hospice. Keep bili drain clamped for now. Bilirubin stable."  Transaminitis and T Bili stable  Leukocytosis - being followed c no evidence of underlying infection.   Borderline resectable adenocarcinoma of the pancreas - She is set to start Intensity Modulated Radiotherapy (IMRT) on Tuesday 10/28th and will receive chemotherapy during the course of radiation treatment. She already started low dose Xeloda - not currently getting. Dr Myna Hidalgo aware of her inpt admission.  The ultimate  goal is to get her to shrink the tumor, have her functional and then work on Resection/Whipple. I have brought up DNR and she/family  is thinking @ this. If we cannot make any progress quickly I am concerned that we will have no choice but to entertain Hospice Palliative Care. I discussed TNA or PEJ tube feedings and GI wants [place] to do PEJ via IR and Gen Surg wants [place] to start TNA - These are on hold as we will watch and monitor caloric intake, labs and weight.   Severe Protein Calorie malnutrition - PushCalories. Consider J Tube vrs TNA - see above. Surprisingly her pre-albumin is 20.2 - I expected lower.  Continue liberalized her diet.  Continue Boost BID  Provide Resource Breeze BID  Decrease Ensure Pudding to once daily  Discontinue Magic Cup Encourage PO intake   AFTT - PT/OT/CW/Sw provided.   Worsening Anemia - Hbg 8.3 and will need outpt follow up. No Obvious bleeding at this time. Some Dilutional?   Ab Pain/N/V - Antiemetics prn   DVT Proph - Squeezers. Hold Lovenox c the renal failure.   Pancreatitis based on Lipase 192-204 but no specific Acute Sxs. Dr Myna Hidalgo started her on Creon as outpt and hard to know if it was helping. CT was (-). I will continue.   Dondra Spry her daughter: [phone]   HTN - BP up and S/P diuresis - can follow as outpatient.   GERD - PO PPI. Nexium at home   She is doing better functionally and mentally. She understands the plan. Issues c Constipation/Diarrhea/Hemorrhoids addressed She is stable for D/C at 2 pm on 10/24.    Day of Discharge Exam BP 150/47  Pulse 77  Temp(Src) 98.6 F (37 C) (Oral)  Resp 16  Ht 5\' 3"  (1.6 m)  Wt 95.9 kg (211 lb 6.7 oz)  BMI 37.46 kg/m2  SpO2 100%  Physical Exam:  See earlier notes and exam  Discharge Labs:  Recent Labs  07/27/13 0447 07/28/13 0558  NA 141 138  K 4.5 3.9  CL 117* 111  CO2 13* 15*  GLUCOSE 89 108*  BUN 50* 29*  CREATININE 1.39* 1.12*  CALCIUM 8.6 8.7    Recent Labs  07/27/13 0447 07/28/13 0558  AST 72* 68*  ALT 125* 119*  ALKPHOS 289* 362*  BILITOT 1.4* 1.4*  PROT 5.8* 5.9*  ALBUMIN 2.3* 2.2*     Recent Labs  07/27/13 0447 07/28/13 0558  WBC 15.7* 15.6*  HGB 8.3* 8.4*  HCT 24.9* 24.5*  MCV 81.4 81.7  PLT 339 295   No results found for this basename: CKTOTAL, CKMB, CKMBINDEX, TROPONINI,  in the last 72 hours No results found for this basename: TSH, T4TOTAL, FREET3, T3FREE, THYROIDAB,  in the last 72 hours No results found for this basename: VITAMINB12, FOLATE, FERRITIN, TIBC, IRON, RETICCTPCT,  in the last 72 hours Lab Results  Component Value Date   INR 1.24 07/24/2013   INR 0.94 07/02/2013       Discharge instructions:  Future Appointments Provider Department Dept Phone   08/01/2013 4:50 PM Jonna Coup, MD [facility] RAD[facility]COLOGY [phone]   Joint Appt [facility] [place] [facility] [place] [facility] RADIATION ONCOLOGY [phone]   08/02/2013 4:50 PM [facility] RADIATION ONCOLOGY [phone]   08/03/2013 4:30 PM [facility] [place] [facility] RADIATION ONCOLOGY [phone]   08/04/2013 4:30 PM [facility] RADIATION [facility]COL[facility]Y [phone]  08/07/2013 4:30 PM [facility] [place] [facility] RADIATION ONCOLOGY [phone]   08/08/2013 10:15 AM [facility] [place] [facility] RADIATION ONCOLOGY [phone]   08/08/2013 10:45 AM Rachael Fee [facility] [phone]   08/08/2013 11:15 AM Josph Macho, MD [facility] [phone]   08/08/2013 11:45 AM [facility] [place] [facility] [phone]   08/09/2013 4:30 PM [facility] RADIATION ONCOLOGY [phone]   08/10/2013 4:30 PM [facility] [place] [facility] RADIATION ONCOLOGY [phone]   08/11/2013 4:30 PM [facility] [place] [facility] RADIATION ONCOLOGY [phone]   08/14/2013 3:40 PM [facility] [place] RADIATION ONCOLOG[facility] [phone]   08/15/2013 3:40 PM [facility] [place] [facility] RADIATION ONCOLOGY [phone]   08/16/2013 3:40 PM [facility] [place] [facility] RADIATION ONCOLOGY [phone]   08/17/2013 3:40 PM [facility] [place] [facility] [facility] RADIATION ONCOLOGY [phone]   08/18/2013 3:40 PM [facility] [facility] RADIATION ONCOLOGY [phone]   08/21/2013 3:40 PM [facility] RADIATION ONCOLOGY [phone]   08/22/2013 3:40 PM [facility] [facility] RADIATION ONCOLOGY [phone]   08/23/2013 3:40 PM [facility] [place] [facility] RADIATION ONCOLOGY [phone]   08/24/2013 3:40 PM [facility] [place] [facility] RADIATION ONCOLOGY [phone]   08/25/2013 3:40 PM [facility] [place] [facility] RADIATION ONCOLOGY [phone]   08/28/2013 3:40 PM [facility] RADIATION ONCOLOGY [phone]   08/29/2013 3:40 PM [facility] [place] [facility] RADIATION ONCOLOGY [phone]   08/30/2013 3:40 PM [facility] [place] [facility] RADIATION ONCOLOGY [phone]   09/04/2013 3:40 PM [facility] [place] [facility] RADIATION ONCOLOGY [phone]   09/05/2013 3:40 PM [facility] [place] [facility] RADIATION ONCOLOGY [phone]   09/06/2013 3:40 PM [facility] [place] [facility] RADIATION ONCOLOGY [phone]   09/07/2013 3:40 PM [facility] RADIATION ONCOLOGY [phone]   09/08/2013 12:00 PM Almond Lint, MD [facility] [place] [phone]   09/08/2013 3:40 PM [facility] RADIATION ONCOLOGY [phone]   09/11/2013 3:40 PM [facility] RADIATION ONCOLOGY [phone]   10/12/2013 11:00 AM Baltazar Najjar [facility] MEDICAL ONCOLOGY [phone]   10/12/2013 12:00 PM Krista Blue [facility] MEDICAL ONCOLOGY [phone]     01-Home or Self Care Follow-up Information   Follow up with Gwen Pounds, MD In 5 days.   Specialty:  Internal Medicine   Contact information:   [address] [facility] [place] [address] [phone]       Follow up with Jonna Coup, MD On 07/31/2013.   Specialty:  Radiation Oncology   Contact information:   [address] [place] [address] [phone]        Disposition: home  Follow-up Appts: Follow-up with Dr. Timothy Lasso at [facility] in 1 week.  Call for appointment.  Condition on Discharge: stable/better  Tests Needing Follow-up: CBC/CMET  Time spent in discharge (includes decision making & examination of pt): 40 min  Signed: Alieu Finnigan M 07/28/2013, 12:41 PM

## 2013-07-28 NOTE — Progress Notes (Signed)
Discharge to home husband at bedside. Discharge instructions and follow up appointments done and discussed with patient in the presence of the husband. No complants of any pain  Or discomfort upon discharge.PIV removed no s/s of infiltration or swelling noted .

## 2013-07-28 NOTE — Progress Notes (Signed)
Pt selected [facility] for Home Health needs. Referral given to in house rep with [facility].

## 2013-07-28 NOTE — Progress Notes (Signed)
PT Cancellation Note  Patient Details Name: NISA DECAIRE MRN: [id number] DOB: Mar 02, 1936   Cancelled Treatment:    Reason Eval/Treat Not Completed: Patient declined, no reason specified-pt plans to d/c home this afternoon-declined participation.    Rebeca Alert, MPT Pager: [phone]

## 2013-07-28 NOTE — Progress Notes (Addendum)
Patient ID: Carolyn [name], female   DOB: Oct 15, 1935, 77 y.o.   MRN: [id number]    Subjective: Much more energetic this AM.    Objective: Vital signs in last 24 hours: Temp:  [98.3 F (36.8 C)-98.6 F (37 C)] 98.6 F (37 C) (10/24 0631) Pulse Rate:  [77-79] 77 (10/24 0631) Resp:  [16] 16 (10/24 0631) BP: (150-157)/(47-50) 150/47 mmHg (10/24 0631) SpO2:  [100 %] 100 % (10/24 0631) Weight:  [211 lb 6.7 oz (95.9 kg)] 211 lb 6.7 oz (95.9 kg) (10/24 0601) Last BM Date: 07/27/13  Intake/Output from previous day: 10/23 0701 - 10/24 0700 In: 600 [P.O.:600] Out: 2200 [Urine:2200] Intake/Output this shift:    General appearance: sitting up, eating some breakfast.   GI: soft, non distended, tender at drain site Extremities: extremities normal, atraumatic, no cyanosis or edema  Lab Results:   Recent Labs  07/27/13 0447 07/28/13 0558  WBC 15.7* 15.6*  HGB 8.3* 8.4*  HCT 24.9* 24.5*  PLT 339 295   BMET  Recent Labs  07/27/13 0447 07/28/13 0558  NA 141 138  K 4.5 3.9  CL 117* 111  CO2 13* 15*  GLUCOSE 89 108*  BUN 50* 29*  CREATININE 1.39* 1.12*  CALCIUM 8.6 8.7   PT/INR No results found for this basename: LABPROT, INR,  in the last 72 hours ABG No results found for this basename: PHART, PCO2, PO2, HCO3,  in the last 72 hours  Studies/Results: Dg Chest Port 1v Same Day  07/27/2013   CLINICAL DATA:  Wheezing, short of breath  EXAM: PORTABLE CHEST - 1 VIEW SAME DAY  COMPARISON:  Prior chest x-ray 07/24/2013  FINDINGS: Minimally increased central airway thickening and peribronchial cuffing. No focal airspace consolidation, or pleural effusion. No pneumothorax. No pulmonary edema. Stable cardiac and mediastinal contours. Atherosclerotic and tortuous thoracic aorta. Oral contrast material noted in the splenic flexure of the colon. Degenerative changes noted in both acromioclavicular joints and at the insertions of the rotator cuff tendons.  IMPRESSION: Slightly increased  central airway thickening/ peribronchial cuffing may represent viral respiratory infection, or an inflammatory process such as COPD or asthma.   Electronically Signed   By: Malachy Moan M.D.   On: 07/27/2013 08:09    Anti-infectives: Anti-infectives   None      Assessment/Plan: s/p * No surgery found * Fluids per Dr. Timothy Lasso for ARF Severe protein calorie malnutrition- Nutritional supplementation , megace for appetite stimulation.  She likes resource breeze.    We will see if she can tolerate neoadjuvant chemotherapy, otherwise discussion would be feeding tube or TNA vs hospice.    Keep bili drain clamped for now.  Bilirubin stable.     LOS: 5 days    [facility] 07/28/2013

## 2013-07-28 NOTE — Progress Notes (Signed)
Last night, patient was complaining of cramping abdomen pain and kept getting up to try to go to the bathroom. Patient stated the area around her rectum was what was hurting her the most. RN tried Miralax and a suppository last night and also got the patient up to walk. RN assessed the patient and noticed she has large hemorrhoids. Now this morning, patient is having diarrhea and she states the abdomen pain is lessening. Will continue to monitor patient.

## 2013-08-01 ENCOUNTER — Ambulatory Visit
Admission: RE | Admit: 2013-08-01 | Discharge: 2013-08-01 | Disposition: A | Discharge status: Home / Self Care | Payer: Medicare Other | Source: Ambulatory Visit | Attending: Radiation Oncology | Admitting: Radiation Oncology

## 2013-08-01 ENCOUNTER — Encounter: Payer: Self-pay | Admitting: *Deleted

## 2013-08-01 NOTE — Progress Notes (Signed)
[facility] Psychosocial Distress Screening Clinical Social Work  Clinical Social Work was referred by distress screening protocol.  The patient scored a 5 on the Psychosocial Distress Thermometer which indicates moderate distress. Clinical Social Worker contacted pt at home to assess for distress and other psychosocial needs. Pt stated she was doing "ok", but was feeling "a little nervous and anxious".  CSW validated and normalized pt's feelings.  Pt is starting radiation treatment today; which she contributed some of her nervousness to.  CSw informed pt of the support team and support services at [facility].  CSw encouraged pt to call with any additional questions or concerns.    Tamala Julian, MSW, LCSW Clinical Social Worker [facility] [phone]

## 2013-08-02 ENCOUNTER — Encounter (INDEPENDENT_AMBULATORY_CARE_PROVIDER_SITE_OTHER): Payer: Self-pay

## 2013-08-02 ENCOUNTER — Ambulatory Visit
Admission: RE | Admit: 2013-08-02 | Discharge: 2013-08-02 | Disposition: A | Discharge status: Home / Self Care | Payer: Medicare Other | Source: Ambulatory Visit | Attending: Radiation Oncology | Admitting: Radiation Oncology

## 2013-08-03 ENCOUNTER — Ambulatory Visit
Admission: RE | Admit: 2013-08-03 | Discharge: 2013-08-03 | Disposition: A | Discharge status: Home / Self Care | Payer: Medicare Other | Source: Ambulatory Visit | Attending: Radiation Oncology | Admitting: Radiation Oncology

## 2013-08-04 ENCOUNTER — Telehealth (INDEPENDENT_AMBULATORY_CARE_PROVIDER_SITE_OTHER): Payer: Self-pay

## 2013-08-04 ENCOUNTER — Encounter: Payer: Self-pay | Admitting: Radiation Oncology

## 2013-08-04 ENCOUNTER — Ambulatory Visit
Admission: RE | Admit: 2013-08-04 | Discharge: 2013-08-04 | Disposition: A | Discharge status: Home / Self Care | Payer: Medicare Other | Source: Ambulatory Visit | Attending: Radiation Oncology | Admitting: Radiation Oncology

## 2013-08-04 VITALS — BP 177/63 | HR 85 | Temp 98.1°F | Resp 20 | Wt 214.0 lb

## 2013-08-04 DIAGNOSIS — C250 Malignant neoplasm of head of pancreas: Secondary | ICD-10-CM

## 2013-08-04 NOTE — Progress Notes (Signed)
Cleansed area peg with normal sailione, removed crusty devri around peg, redressed with drain gaiuse and medipore tape, teach back given 5:14 PM

## 2013-08-04 NOTE — Progress Notes (Signed)
Department of Radiation Oncology  Phone:  [phone] Fax:        [phone]  Weekly Treatment Note    Name: Carolyn [name] Date: 08/04/2013 MRN: [id number] DOB: 05/02/1936   Current dose: 7.6 Gy  Current fraction: 4   MEDICATIONS: Current Outpatient Prescriptions  Medication Sig Dispense Refill  . acetaminophen (TYLENOL) 325 MG tablet Take 1 tablet (325 mg total) by mouth every 6 (six) hours as needed.      . bisacodyl (DULCOLAX) 10 MG suppository Place 1 suppository (10 mg total) rectally daily as needed.  12 suppository  0  . cholecalciferol (VITAMIN D) 1000 UNITS tablet Take 1,000 Units by mouth daily.      Marland Kitchen docusate sodium 100 MG CAPS Take 100 mg by mouth 2 (two) times daily.  10 capsule  0  . dronabinol (MARINOL) 2.5 MG capsule Take 1 pill 3 times a day.  60 capsule  2  . esomeprazole (NEXIUM) 40 MG capsule Take 40 mg by mouth daily before breakfast.      . feeding supplement (ENSURE COMPLETE) LIQD Take 237 mLs by mouth 2 (two) times daily between meals as needed (Please offer to pt if eating <50% of meals).      . feeding supplement, ENSURE, (ENSURE) PUDG Take 1 Container by mouth daily.    0  . feeding supplement, RESOURCE BREEZE, (RESOURCE BREEZE) LIQD Take 1 Container by mouth 2 (two) times daily with breakfast and lunch.    0  . HYDROcodone-acetaminophen (NORCO/VICODIN) 5-325 MG per tablet Take 1 tablet by mouth every 6 (six) hours as needed for pain.      . hydrocortisone cream 1 % Apply topically 2 (two) times daily to hemorrhoids.  30 g  0  . lactose free nutrition (BOOST PLUS) LIQD Take 237 mLs by mouth 2 (two) times daily between meals.    0  . lipase/protease/amylase (CREON-12/PANCREASE) 12000 UNITS CPEP capsule Take 3 capsules by mouth 3 (three) times daily with meals.      . megestrol (MEGACE) 400 MG/10ML suspension Take 10 mLs (400 mg total) by mouth 2 (two) times daily.  240 mL  0  . metoCLOPramide (REGLAN) 10 MG tablet Take 1 tablet (10 mg total) by  mouth 4 (four) times daily.  30 tablet  0  . Multiple Vitamin (MULTIVITAMIN WITH MINERALS) TABS tablet Take 1 tablet by mouth daily.      . ondansetron (ZOFRAN) 8 MG tablet Take 1 tablet (8 mg total) by mouth every 8 (eight) hours as needed for nausea.  20 tablet  0  . polyethylene glycol (MIRALAX / GLYCOLAX) packet Take 17 g by mouth daily as needed.  14 each  0  . prochlorperazine (COMPAZINE) 25 MG suppository Place 1 suppository (25 mg total) rectally every 12 (twelve) hours as needed for nausea.  12 suppository  0   No current facility-administered medications for this encounter.     ALLERGIES: Other   LABORATORY DATA:  Lab Results  Component Value Date   WBC 15.6* 07/28/2013   HGB 8.4* 07/28/2013   HCT 24.5* 07/28/2013   MCV 81.7 07/28/2013   PLT 295 07/28/2013   Lab Results  Component Value Date   NA 138 07/28/2013   K 3.9 07/28/2013   CL 111 07/28/2013   CO2 15* 07/28/2013   Lab Results  Component Value Date   ALT 119* 07/28/2013   AST 68* 07/28/2013   ALKPHOS 362* 07/28/2013   BILITOT 1.4* 07/28/2013  NARRATIVE: VALEREE LEIDY was seen today for weekly treatment management. The chart was checked and the patient's films were reviewed. The patient states that she is doing well this week overall. Her treatments have been longer than average but this has been improving. No nausea. No diarrhea.  PHYSICAL EXAMINATION: weight is 214 lb (97.07 kg). Her oral temperature is 98.1 F (36.7 C). Her blood pressure is 177/63 and her pulse is 85. Her respiration is 20.        ASSESSMENT: The patient is doing satisfactorily with treatment.  PLAN: We will continue with the patient's radiation treatment as planned.

## 2013-08-04 NOTE — Telephone Encounter (Signed)
Error.  No notes in this encounter.

## 2013-08-06 ENCOUNTER — Encounter: Payer: Self-pay | Admitting: Hematology & Oncology

## 2013-08-07 ENCOUNTER — Ambulatory Visit
Admission: RE | Admit: 2013-08-07 | Discharge: 2013-08-07 | Disposition: A | Discharge status: Home / Self Care | Payer: Medicare Other | Source: Ambulatory Visit | Attending: Radiation Oncology | Admitting: Radiation Oncology

## 2013-08-08 ENCOUNTER — Ambulatory Visit (HOSPITAL_BASED_OUTPATIENT_CLINIC_OR_DEPARTMENT_OTHER): Payer: Medicare Other | Admitting: Hematology & Oncology

## 2013-08-08 ENCOUNTER — Ambulatory Visit (HOSPITAL_BASED_OUTPATIENT_CLINIC_OR_DEPARTMENT_OTHER): Payer: Medicare Other

## 2013-08-08 ENCOUNTER — Ambulatory Visit
Admission: RE | Admit: 2013-08-08 | Discharge: 2013-08-08 | Disposition: A | Discharge status: Home / Self Care | Payer: Medicare Other | Source: Ambulatory Visit | Attending: Radiation Oncology | Admitting: Radiation Oncology

## 2013-08-08 ENCOUNTER — Other Ambulatory Visit (HOSPITAL_BASED_OUTPATIENT_CLINIC_OR_DEPARTMENT_OTHER): Payer: Medicare Other | Admitting: Lab

## 2013-08-08 VITALS — BP 152/65 | HR 87 | Temp 98.6°F | Resp 14 | Ht 63.0 in | Wt 212.0 lb

## 2013-08-08 DIAGNOSIS — F411 Generalized anxiety disorder: Secondary | ICD-10-CM

## 2013-08-08 DIAGNOSIS — F064 Anxiety disorder due to known physiological condition: Secondary | ICD-10-CM

## 2013-08-08 DIAGNOSIS — E860 Dehydration: Secondary | ICD-10-CM

## 2013-08-08 DIAGNOSIS — C259 Malignant neoplasm of pancreas, unspecified: Secondary | ICD-10-CM

## 2013-08-08 DIAGNOSIS — E876 Hypokalemia: Secondary | ICD-10-CM

## 2013-08-08 DIAGNOSIS — K859 Acute pancreatitis without necrosis or infection, unspecified: Secondary | ICD-10-CM

## 2013-08-08 LAB — CBC WITH DIFFERENTIAL (CANCER CENTER ONLY)
BASO%: 0.1 % (ref 0.0–2.0)
Eosinophils Absolute: 0.2 10*3/uL (ref 0.0–0.5)
HGB: 8.7 g/dL — ABNORMAL LOW (ref 11.6–15.9)
LYMPH#: 2.5 10*3/uL (ref 0.9–3.3)
LYMPH%: 18.2 % (ref 14.0–48.0)
MCV: 86 fL (ref 81–101)
MONO#: 1.4 10*3/uL — ABNORMAL HIGH (ref 0.1–0.9)
NEUT#: 9.6 10*3/uL — ABNORMAL HIGH (ref 1.5–6.5)
Platelets: 414 10*3/uL — ABNORMAL HIGH (ref 145–400)
RBC: 3.2 10*6/uL — ABNORMAL LOW (ref 3.70–5.32)
RDW: 17.8 % — ABNORMAL HIGH (ref 11.1–15.7)
WBC: 13.6 10*3/uL — ABNORMAL HIGH (ref 3.9–10.0)

## 2013-08-08 LAB — CMP (CANCER CENTER ONLY)
AST: 41 U/L — ABNORMAL HIGH (ref 11–38)
BUN, Bld: 8 mg/dL (ref 7–22)
CO2: 30 mEq/L (ref 18–33)
Creat: 0.8 mg/dl (ref 0.6–1.2)
Glucose, Bld: 116 mg/dL (ref 73–118)
Sodium: 138 mEq/L (ref 128–145)
Total Bilirubin: 1.1 mg/dl (ref 0.20–1.60)

## 2013-08-08 LAB — PREALBUMIN: Prealbumin: 8.9 mg/dL — ABNORMAL LOW (ref 17.0–34.0)

## 2013-08-08 MED ORDER — ALPRAZOLAM 0.25 MG PO TABS: ORAL_TABLET | ORAL | Status: DC

## 2013-08-08 MED ORDER — POTASSIUM CHLORIDE CRYS ER 20 MEQ PO TBCR: EXTENDED_RELEASE_TABLET | ORAL | Status: DC

## 2013-08-08 MED ORDER — DIAZEPAM 5 MG/ML IJ SOLN
INTRAMUSCULAR | Status: AC
  Filled 2013-08-08: qty 2

## 2013-08-08 MED ORDER — DIAZEPAM 5 MG/ML IJ SOLN
2.5000 mg | Freq: Once | INTRAMUSCULAR | Status: AC
  Administered 2013-08-08: 13:00:00 via INTRAVENOUS

## 2013-08-08 MED ORDER — SODIUM CHLORIDE 0.9 % IV SOLN
1000.0000 mL | Freq: Once | INTRAVENOUS | Status: AC
  Administered 2013-08-08: 1000 mL via INTRAVENOUS

## 2013-08-08 NOTE — Progress Notes (Signed)
This office note has been dictated.

## 2013-08-08 NOTE — Patient Instructions (Signed)
Dehydration, Adult Dehydration is when you lose more fluids from the body than you take in. Vital organs like the kidneys, brain, and heart cannot function without a proper amount of fluids and salt. Any loss of fluids from the body can cause dehydration.  CAUSES   Vomiting.  Diarrhea.  Excessive sweating.  Excessive urine output.  Fever. SYMPTOMS  Mild dehydration  Thirst.  Dry lips.  Slightly dry mouth. Moderate dehydration  Very dry mouth.  Sunken eyes.  Skin does not bounce back quickly when lightly pinched and released.  Dark urine and decreased urine production.  Decreased tear production.  Headache. Severe dehydration  Very dry mouth.  Extreme thirst.  Rapid, weak pulse (more than 100 beats per minute at rest).  Cold hands and feet.  Not able to sweat in spite of heat and temperature.  Rapid breathing.  Blue lips.  Confusion and lethargy.  Difficulty being awakened.  Minimal urine production.  No tears. DIAGNOSIS  Your caregiver will diagnose dehydration based on your symptoms and your exam. Blood and urine tests will help confirm the diagnosis. The diagnostic evaluation should also identify the cause of dehydration. TREATMENT  Treatment of mild or moderate dehydration can often be done at home by increasing the amount of fluids that you drink. It is best to drink small amounts of fluid more often. Drinking too much at one time can make vomiting worse. Refer to the home care instructions below. Severe dehydration needs to be treated at the hospital where you will probably be given intravenous (IV) fluids that contain water and electrolytes. HOME CARE INSTRUCTIONS   Ask your caregiver about specific rehydration instructions.  Drink enough fluids to keep your urine clear or pale yellow.  Drink small amounts frequently if you have nausea and vomiting.  Eat as you normally do.  Avoid:  Foods or drinks high in sugar.  Carbonated  drinks.  Juice.  Extremely hot or cold fluids.  Drinks with caffeine.  Fatty, greasy foods.  Alcohol.  Tobacco.  Overeating.  Gelatin desserts.  Wash your hands well to avoid spreading bacteria and viruses.  Only take over-the-counter or prescription medicines for pain, discomfort, or fever as directed by your caregiver.  Ask your caregiver if you should continue all prescribed and over-the-counter medicines.  Keep all follow-up appointments with your caregiver. SEEK MEDICAL CARE IF:  You have abdominal pain and it increases or stays in one area (localizes).  You have a rash, stiff neck, or severe headache.  You are irritable, sleepy, or difficult to awaken.  You are weak, dizzy, or extremely thirsty. SEEK IMMEDIATE MEDICAL CARE IF:   You are unable to keep fluids down or you get worse despite treatment.  You have frequent episodes of vomiting or diarrhea.  You have blood or green matter (bile) in your vomit.  You have blood in your stool or your stool looks black and tarry.  You have not urinated in 6 to 8 hours, or you have only urinated a small amount of very dark urine.  You have a fever.  You faint. MAKE SURE YOU:   Understand these instructions.  Will watch your condition.  Will get help right away if you are not doing well or get worse. Document Released: 09/21/2005 Document Revised: 12/14/2011 Document Reviewed: 05/11/2011 ExitCare Patient Information 2014 [employer].  

## 2013-08-09 ENCOUNTER — Ambulatory Visit
Admission: RE | Admit: 2013-08-09 | Discharge: 2013-08-09 | Disposition: A | Discharge status: Home / Self Care | Payer: Medicare Other | Source: Ambulatory Visit | Attending: Radiation Oncology | Admitting: Radiation Oncology

## 2013-08-09 VITALS — BP 183/55 | HR 88 | Temp 99.0°F | Resp 20 | Wt 213.4 lb

## 2013-08-09 DIAGNOSIS — C250 Malignant neoplasm of head of pancreas: Secondary | ICD-10-CM

## 2013-08-09 NOTE — Progress Notes (Signed)
Department of Radiation Oncology  Phone:  [phone] Fax:        [phone]  Weekly Treatment Note    Name: Carolyn [name] Date: 08/09/2013 MRN: [id number] DOB: Nov 02, 1935   Current dose: 13.3 Gy  Current fraction: 7   MEDICATIONS: Current Outpatient Prescriptions  Medication Sig Dispense Refill  . acetaminophen (TYLENOL) 325 MG tablet Take 1 tablet (325 mg total) by mouth every 6 (six) hours as needed.      . capecitabine (XELODA) 500 MG tablet Take by mouth 2 (two) times daily after a meal.      . cholecalciferol (VITAMIN D) 1000 UNITS tablet Take 1,000 Units by mouth daily.      Marland Kitchen esomeprazole (NEXIUM) 40 MG capsule Take 40 mg by mouth daily before breakfast.      . hydrocortisone cream 1 % Apply topically 2 (two) times daily to hemorrhoids.  30 g  0  . lipase/protease/amylase (CREON-12/PANCREASE) 12000 UNITS CPEP capsule Take 3 capsules by mouth 3 (three) times daily with meals.      . metoCLOPramide (REGLAN) 10 MG tablet Take 1 tablet (10 mg total) by mouth 4 (four) times daily.  30 tablet  0  . Multiple Vitamin (MULTIVITAMIN WITH MINERALS) TABS tablet Take 1 tablet by mouth daily.      . ondansetron (ZOFRAN) 8 MG tablet Take 1 tablet (8 mg total) by mouth every 8 (eight) hours as needed for nausea.  20 tablet  0  . polyethylene glycol (MIRALAX / GLYCOLAX) packet Take 17 g by mouth daily as needed.  14 each  0  . potassium chloride SA (K-DUR,KLOR-CON) 20 MEQ tablet Take 1 tablet daily.  30 tablet  2  . prochlorperazine (COMPAZINE) 25 MG suppository Place 1 suppository (25 mg total) rectally every 12 (twelve) hours as needed for nausea.  12 suppository  0  . ALPRAZolam (XANAX) 0.25 MG tablet Take 1-2 pills, IF NEEDED, every 8 hr for anxiety/sleep  60 tablet  0  . docusate sodium 100 MG CAPS Take 100 mg by mouth 2 (two) times daily.  10 capsule  0  . dronabinol (MARINOL) 2.5 MG capsule Take 1 pill 3 times a day.  60 capsule  2  . feeding supplement (ENSURE COMPLETE) LIQD  Take 237 mLs by mouth 2 (two) times daily between meals as needed (Please offer to pt if eating <50% of meals).      Marland Kitchen HYDROcodone-acetaminophen (NORCO/VICODIN) 5-325 MG per tablet Take 1 tablet by mouth every 6 (six) hours as needed for pain.      . megestrol (MEGACE) 400 MG/10ML suspension Take 10 mLs (400 mg total) by mouth 2 (two) times daily.  240 mL  0   No current facility-administered medications for this encounter.     ALLERGIES: Other   LABORATORY DATA:  Lab Results  Component Value Date   WBC 13.6* 08/08/2013   HGB 8.7* 08/08/2013   HCT 27.4* 08/08/2013   MCV 86 08/08/2013   PLT 414* 08/08/2013   Lab Results  Component Value Date   NA 138 08/08/2013   K 3.1* 08/08/2013   CL 99 08/08/2013   CO2 30 08/08/2013   Lab Results  Component Value Date   ALT 41 08/08/2013   AST 41* 08/08/2013   ALKPHOS 320* 08/08/2013   BILITOT 1.10 08/08/2013     NARRATIVE: BRYLIN STOPPER was seen today for weekly treatment management. The chart was checked and the patient's films were reviewed. The patient states that she  doesn't feel well overall. No significant nausea/diarrhea. She is having quite a bit of fatigue as well as some anxiety. She does have anxiety medication to take for this which helps.  PHYSICAL EXAMINATION: weight is 213 lb 6.4 oz (96.798 kg). Her temperature is 99 F (37.2 C). Her blood pressure is 183/55 and her pulse is 88. Her respiration is 20 and oxygen saturation is 100%.        ASSESSMENT: The patient is doing satisfactorily with treatment.  PLAN: We will continue with the patient's radiation treatment as planned.

## 2013-08-09 NOTE — Progress Notes (Signed)
CC:   Carolyn Pounds, MD  FINAL DIAGNOSIS:  Borderline resectable adenocarcinoma of the pancreas.  CURRENT THERAPY:  The patient is receiving radiation with low-dose Xeloda.  INTERIM HISTORY:  Carolyn [name] comes in for a visit.  Unfortunately, she is really having a hard time with treatment.  I am not sure exactly what the real problem is.  She was hospitalized I think about a week or so ago.  She had renal failure.  She is markedly dehydrated.  She had a lot of anxiety.  She says she cannot sleep at night time.  We will try her on some Xanax to see if this helps her a little bit.  She has had I think 4 or 5 treatments with radiation and Xeloda.  Xeloda dose is quite low.  I am not sure how much she can tolerate.  She is not having any kind of problems with diarrhea.  There has been no fever.  She has had no cough.  Again, she just is very anxious.  Hopefully, we can get this improved for her.  Currently, her performance status is ECOG stage 2.  She is on Marinol and Megace to try to help with her appetite.  It is hard to say how much this is helping her.  We also have her on pancreatic enzyme replacement.  She has had no bleeding.  She has had no leg swelling.  She had no rashes.  There is no burning in the hands or feet.  PHYSICAL EXAMINATION:  General:  Somewhat elderly-appearing African American female in no obvious distress.  Vital Signs:  Temperature of 98.6, pulse 87, respiratory rate 14, blood pressure 152/65, weight is 212 [name].  HEENT:  Head and neck exam shows a normocephalic, atraumatic skull.  There are no ocular or oral lesions.  She has no mucositis.  There is no scleral icterus.  Neck:  She has no adenopathy within the neck.  Lungs:  Clear bilaterally.  Cardiac:  Regular rate and rhythm with a normal S1, S2.  There are no murmurs, rubs, or bruits. Abdomen:  Soft.  She has good bowel sounds.  There was no fluid wave. There was no palpable hepatosplenomegaly.  She  has biliary drainage tube in, that is intact.  Extremities:  Some chronic nonpitting edema of the lower legs.  Skin:  No rashes.  Neurologic:  No focal neurological deficits.  Rectal:  Exam was done.  She did have some external hemorrhoids.  There was absolutely no stool in the rectal vault.  What I have got back was heme negative.  LABORATORY STUDIES:  White blood cell count is 13.6, hemoglobin 8.7, hematocrit 27.4, platelet count 414.  MCV is 86.  BUN is 8, creatinine 0.8.  Calcium 8.2 with an albumin of 2.3.  Total bilirubin is 1.1.  Potassium is 3.1.  IMPRESSION:  Carolyn [name] is a nice 77 year old African American female. She has borderline resectable pancreatic cancer.  We are trying to treat her and get her to surgery for resection.  It is hard to say what we will be able to accomplish.  Hopefully, the Xanax will help with her anxiety.  She has gained a little weight.  This is somewhat encouraging.  I do want to give her some IV fluids today.  We will give her a little Valium with this.  We will see if this helps her out a little bit.  Potassium is a little on the low side.  We will give  her a little potassium intake.  I think we are going to have to get her back to see [place] in about 2 weeks.  I spent good 40 minutes with her today.    ______________________________ Josph Macho, M.D. PRE/MEDQ  D:  08/08/2013  T:  08/09/2013  Job:  [id number]

## 2013-08-09 NOTE — Progress Notes (Signed)
Patient here for routine weekly assessment of radiation to pancreas.Denies pain or nausea.Overall patient states she feels bad, and jittery.Started potassium yesterday.Encouraged to take anti-anxiety medication.

## 2013-08-10 ENCOUNTER — Other Ambulatory Visit: Payer: Self-pay

## 2013-08-10 ENCOUNTER — Ambulatory Visit
Admission: RE | Admit: 2013-08-10 | Discharge: 2013-08-10 | Disposition: A | Discharge status: Home / Self Care | Payer: Medicare Other | Source: Ambulatory Visit | Attending: Radiation Oncology | Admitting: Radiation Oncology

## 2013-08-11 ENCOUNTER — Ambulatory Visit
Admission: RE | Admit: 2013-08-11 | Discharge: 2013-08-11 | Disposition: A | Discharge status: Home / Self Care | Payer: Medicare Other | Source: Ambulatory Visit | Attending: Radiation Oncology | Admitting: Radiation Oncology

## 2013-08-14 ENCOUNTER — Encounter (HOSPITAL_COMMUNITY): Payer: Self-pay | Admitting: Emergency Medicine

## 2013-08-14 ENCOUNTER — Inpatient Hospital Stay (HOSPITAL_COMMUNITY)
Admission: EM | Admit: 2013-08-14 | Discharge: 2013-08-21 | DRG: 435 | Disposition: A | Discharge status: Skilled Nursing Facility | Payer: Medicare Other | Attending: Internal Medicine | Admitting: Internal Medicine

## 2013-08-14 ENCOUNTER — Telehealth: Payer: Self-pay

## 2013-08-14 ENCOUNTER — Emergency Department (HOSPITAL_COMMUNITY): Payer: Medicare Other

## 2013-08-14 ENCOUNTER — Ambulatory Visit: Payer: Medicare Other

## 2013-08-14 DIAGNOSIS — J45909 Unspecified asthma, uncomplicated: Secondary | ICD-10-CM | POA: Diagnosis present

## 2013-08-14 DIAGNOSIS — Z79899 Other long term (current) drug therapy: Secondary | ICD-10-CM

## 2013-08-14 DIAGNOSIS — F411 Generalized anxiety disorder: Secondary | ICD-10-CM | POA: Diagnosis present

## 2013-08-14 DIAGNOSIS — D72829 Elevated white blood cell count, unspecified: Secondary | ICD-10-CM | POA: Diagnosis present

## 2013-08-14 DIAGNOSIS — E785 Hyperlipidemia, unspecified: Secondary | ICD-10-CM | POA: Diagnosis present

## 2013-08-14 DIAGNOSIS — Z923 Personal history of irradiation: Secondary | ICD-10-CM

## 2013-08-14 DIAGNOSIS — R64 Cachexia: Secondary | ICD-10-CM

## 2013-08-14 DIAGNOSIS — D649 Anemia, unspecified: Secondary | ICD-10-CM | POA: Diagnosis present

## 2013-08-14 DIAGNOSIS — E876 Hypokalemia: Secondary | ICD-10-CM | POA: Diagnosis present

## 2013-08-14 DIAGNOSIS — R627 Adult failure to thrive: Secondary | ICD-10-CM | POA: Diagnosis present

## 2013-08-14 DIAGNOSIS — Z9221 Personal history of antineoplastic chemotherapy: Secondary | ICD-10-CM

## 2013-08-14 DIAGNOSIS — R630 Anorexia: Secondary | ICD-10-CM | POA: Diagnosis present

## 2013-08-14 DIAGNOSIS — E46 Unspecified protein-calorie malnutrition: Secondary | ICD-10-CM | POA: Insufficient documentation

## 2013-08-14 DIAGNOSIS — R6251 Failure to thrive (child): Secondary | ICD-10-CM

## 2013-08-14 DIAGNOSIS — K5900 Constipation, unspecified: Secondary | ICD-10-CM | POA: Diagnosis present

## 2013-08-14 DIAGNOSIS — I10 Essential (primary) hypertension: Secondary | ICD-10-CM | POA: Diagnosis present

## 2013-08-14 DIAGNOSIS — F064 Anxiety disorder due to known physiological condition: Secondary | ICD-10-CM

## 2013-08-14 DIAGNOSIS — Z6835 Body mass index (BMI) 35.0-35.9, adult: Secondary | ICD-10-CM

## 2013-08-14 DIAGNOSIS — F329 Major depressive disorder, single episode, unspecified: Secondary | ICD-10-CM

## 2013-08-14 DIAGNOSIS — D62 Acute posthemorrhagic anemia: Secondary | ICD-10-CM | POA: Diagnosis present

## 2013-08-14 DIAGNOSIS — C259 Malignant neoplasm of pancreas, unspecified: Secondary | ICD-10-CM

## 2013-08-14 DIAGNOSIS — F3289 Other specified depressive episodes: Secondary | ICD-10-CM | POA: Diagnosis present

## 2013-08-14 DIAGNOSIS — Z66 Do not resuscitate: Secondary | ICD-10-CM | POA: Diagnosis not present

## 2013-08-14 DIAGNOSIS — G893 Neoplasm related pain (acute) (chronic): Secondary | ICD-10-CM | POA: Diagnosis present

## 2013-08-14 DIAGNOSIS — K831 Obstruction of bile duct: Secondary | ICD-10-CM | POA: Diagnosis present

## 2013-08-14 DIAGNOSIS — K219 Gastro-esophageal reflux disease without esophagitis: Secondary | ICD-10-CM | POA: Diagnosis present

## 2013-08-14 DIAGNOSIS — Z87891 Personal history of nicotine dependence: Secondary | ICD-10-CM

## 2013-08-14 DIAGNOSIS — R110 Nausea: Secondary | ICD-10-CM | POA: Diagnosis present

## 2013-08-14 DIAGNOSIS — E43 Unspecified severe protein-calorie malnutrition: Secondary | ICD-10-CM | POA: Diagnosis present

## 2013-08-14 DIAGNOSIS — K838 Other specified diseases of biliary tract: Secondary | ICD-10-CM | POA: Diagnosis present

## 2013-08-14 DIAGNOSIS — C250 Malignant neoplasm of head of pancreas: Principal | ICD-10-CM | POA: Diagnosis present

## 2013-08-14 DIAGNOSIS — R109 Unspecified abdominal pain: Secondary | ICD-10-CM | POA: Diagnosis present

## 2013-08-14 LAB — CBC
Hemoglobin: 9.3 g/dL — ABNORMAL LOW (ref 12.0–15.0)
MCH: 27 pg (ref 26.0–34.0)
MCV: 85.2 fL (ref 78.0–100.0)
Platelets: 552 10*3/uL — ABNORMAL HIGH (ref 150–400)
RBC: 3.44 MIL/uL — ABNORMAL LOW (ref 3.87–5.11)
WBC: 11.2 10*3/uL — ABNORMAL HIGH (ref 4.0–10.5)

## 2013-08-14 LAB — BASIC METABOLIC PANEL
CO2: 25 mEq/L (ref 19–32)
Calcium: 9.7 mg/dL (ref 8.4–10.5)
Chloride: 96 mEq/L (ref 96–112)
Creatinine, Ser: 0.76 mg/dL (ref 0.50–1.10)
Glucose, Bld: 91 mg/dL (ref 70–99)
Sodium: 132 mEq/L — ABNORMAL LOW (ref 135–145)

## 2013-08-14 LAB — HEPATIC FUNCTION PANEL
ALT: 17 U/L (ref 0–35)
AST: 24 U/L (ref 0–37)
Albumin: 2.5 g/dL — ABNORMAL LOW (ref 3.5–5.2)
Indirect Bilirubin: 0.4 mg/dL (ref 0.3–0.9)
Total Protein: 7.4 g/dL (ref 6.0–8.3)

## 2013-08-14 LAB — LIPASE, BLOOD: Lipase: 41 U/L (ref 11–59)

## 2013-08-14 LAB — LACTIC ACID, PLASMA: Lactic Acid, Venous: 1.1 mmol/L (ref 0.5–2.2)

## 2013-08-14 LAB — URINALYSIS, ROUTINE W REFLEX MICROSCOPIC
Bilirubin Urine: NEGATIVE
Glucose, UA: NEGATIVE mg/dL
Ketones, ur: NEGATIVE mg/dL
Leukocytes, UA: NEGATIVE
Nitrite: NEGATIVE
Specific Gravity, Urine: 1.008 (ref 1.005–1.030)
Urobilinogen, UA: 0.2 mg/dL (ref 0.0–1.0)
pH: 7 (ref 5.0–8.0)

## 2013-08-14 MED ORDER — SODIUM CHLORIDE 0.9 % IV BOLUS (SEPSIS)
1000.0000 mL | Freq: Once | INTRAVENOUS | Status: AC
  Administered 2013-08-14: 1000 mL via INTRAVENOUS

## 2013-08-14 MED ORDER — HYDROCODONE-ACETAMINOPHEN 5-325 MG PO TABS
1.0000 | ORAL_TABLET | Freq: Four times a day (QID) | ORAL | Status: DC | PRN
  Administered 2013-08-15 – 2013-08-18 (×3): 1 via ORAL
  Filled 2013-08-14 (×3): qty 1

## 2013-08-14 MED ORDER — HEPARIN SODIUM (PORCINE) 5000 UNIT/ML IJ SOLN
5000.0000 [IU] | Freq: Three times a day (TID) | INTRAMUSCULAR | Status: DC
  Administered 2013-08-15 – 2013-08-21 (×17): 5000 [IU] via SUBCUTANEOUS
  Filled 2013-08-14 (×21): qty 1

## 2013-08-14 MED ORDER — ONDANSETRON HCL 4 MG/2ML IJ SOLN
4.0000 mg | Freq: Four times a day (QID) | INTRAMUSCULAR | Status: DC | PRN
  Administered 2013-08-14 – 2013-08-18 (×8): 4 mg via INTRAVENOUS
  Filled 2013-08-14 (×8): qty 2

## 2013-08-14 MED ORDER — ONDANSETRON HCL 4 MG/2ML IJ SOLN
4.0000 mg | Freq: Once | INTRAMUSCULAR | Status: AC
  Administered 2013-08-14: 4 mg via INTRAVENOUS
  Filled 2013-08-14: qty 2

## 2013-08-14 MED ORDER — ACETAMINOPHEN 325 MG PO TABS: 325.0000 mg | ORAL_TABLET | Freq: Four times a day (QID) | ORAL | Status: DC | PRN

## 2013-08-14 MED ORDER — SODIUM CHLORIDE 0.9 % IV SOLN
INTRAVENOUS | Status: DC
  Administered 2013-08-14 – 2013-08-16 (×2): 1000 mL via INTRAVENOUS
  Administered 2013-08-17: 10 mL/h via INTRAVENOUS
  Administered 2013-08-17: 1000 mL via INTRAVENOUS
  Administered 2013-08-19: 20 mL/h via INTRAVENOUS

## 2013-08-14 MED ORDER — MORPHINE SULFATE 4 MG/ML IJ SOLN
4.0000 mg | Freq: Once | INTRAMUSCULAR | Status: AC
  Administered 2013-08-14: 4 mg via INTRAVENOUS
  Filled 2013-08-14: qty 1

## 2013-08-14 MED ORDER — ALPRAZOLAM 0.25 MG PO TABS
0.2500 mg | ORAL_TABLET | Freq: Two times a day (BID) | ORAL | Status: DC | PRN
  Administered 2013-08-15 – 2013-08-21 (×4): 0.25 mg via ORAL
  Filled 2013-08-14 (×4): qty 1

## 2013-08-14 MED ORDER — VITAMIN D3 25 MCG (1000 UNIT) PO TABS
1000.0000 [IU] | ORAL_TABLET | Freq: Every day | ORAL | Status: DC
  Administered 2013-08-15 – 2013-08-16 (×2): 1000 [IU] via ORAL
  Filled 2013-08-14 (×3): qty 1

## 2013-08-14 MED ORDER — PANTOPRAZOLE SODIUM 40 MG PO TBEC
40.0000 mg | DELAYED_RELEASE_TABLET | Freq: Every day | ORAL | Status: DC
  Administered 2013-08-15 – 2013-08-21 (×7): 40 mg via ORAL
  Filled 2013-08-14 (×7): qty 1

## 2013-08-14 MED ORDER — MORPHINE SULFATE 4 MG/ML IJ SOLN
4.0000 mg | INTRAMUSCULAR | Status: DC | PRN
  Administered 2013-08-14 – 2013-08-21 (×23): 4 mg via INTRAVENOUS
  Filled 2013-08-14 (×23): qty 1

## 2013-08-14 MED ORDER — PANCRELIPASE (LIP-PROT-AMYL) 12000-38000 UNITS PO CPEP
3.0000 | ORAL_CAPSULE | Freq: Three times a day (TID) | ORAL | Status: DC
  Administered 2013-08-15 – 2013-08-21 (×18): 3 via ORAL
  Filled 2013-08-14 (×22): qty 3

## 2013-08-14 MED ORDER — ADULT MULTIVITAMIN W/MINERALS CH
1.0000 | ORAL_TABLET | Freq: Every day | ORAL | Status: DC
  Administered 2013-08-14 – 2013-08-21 (×8): 1 via ORAL
  Filled 2013-08-14 (×8): qty 1

## 2013-08-14 MED ORDER — ENSURE COMPLETE PO LIQD: 237.0000 mL | Freq: Two times a day (BID) | ORAL | Status: DC | PRN

## 2013-08-14 MED ORDER — DOCUSATE SODIUM 100 MG PO CAPS
100.0000 mg | ORAL_CAPSULE | Freq: Two times a day (BID) | ORAL | Status: DC
  Administered 2013-08-14 – 2013-08-21 (×13): 100 mg via ORAL
  Filled 2013-08-14 (×15): qty 1

## 2013-08-14 NOTE — ED Provider Notes (Signed)
CSN: [id number]     Arrival date & time 08/14/13  1333 History   First MD Initiated Contact with Patient 08/14/13 1351     Chief Complaint  Patient presents with  . Abdominal Pain   (Consider location/radiation/quality/duration/timing/severity/associated sxs/prior Treatment) Patient is a 77 y.o. female presenting with abdominal pain. The history is provided by the patient and the spouse.  Abdominal Pain Pain location:  Generalized Pain quality: aching and sharp   Pain radiates to:  Does not radiate Pain severity:  Moderate Onset quality:  Sudden Duration:  2 days Timing:  Constant Progression:  Worsening Chronicity:  New Context: not eating, not recent illness, not recent travel and not trauma   Relieved by:  Nothing Worsened by:  Nothing tried Associated symptoms: no chest pain, no cough, no diarrhea, no fever, no nausea, no shortness of breath and no vomiting     Past Medical History  Diagnosis Date  . Hypertension   . Hyperlipidemia   . GERD (gastroesophageal reflux disease)   . Asthma   . Obstructive jaundice 06/2013  . Pancreatitis   . Arthritis   . Cancer   . Heart murmur   . Thyroid disease   . Pancreatic cancer 07/05/13    pancreatic head=adenocarcinoma   Past Surgical History  Procedure Laterality Date  . Neck surgery  2005/2007  . Hand surgery  2010  . Cataract extraction    . Abdominal hysterectomy    . Eus N/A 07/05/2013    Procedure: ESOPHAGEAL ENDOSCOPIC ULTRASOUND (EUS) RADIAL;  Surgeon: Willis Modena, MD;  Location: [facility];  Service: Endoscopy;  Laterality: N/A;  . Ercp N/A 07/05/2013    Procedure: ENDOSCOPIC RETROGRADE CHOLANGIOPANCREATOGRAPHY (ERCP);  Surgeon: Willis Modena, MD;  Location: [facility];  Service: Endoscopy;  Laterality: N/A;  . Buttock cyst removal    . Cholecystectomy     Family History  Problem Relation Age of Onset  . Cancer Daughter     Breast   History  Substance Use Topics  . Smoking status: Former Smoker --  1.00 packs/day for 20 years    Types: Cigarettes    Quit date: 06/29/2013  . Smokeless tobacco: Not on file     Comment: PACK A WEEK  . Alcohol Use: 0.6 oz/week    1 Glasses of wine per week     Comment: SOMETIMES   OB History   Grav Para Term Preterm Abortions TAB SAB Ect Mult Living                 Review of Systems  Constitutional: Negative for fever.  Respiratory: Negative for cough and shortness of breath.   Cardiovascular: Negative for chest pain.  Gastrointestinal: Positive for abdominal pain. Negative for nausea, vomiting and diarrhea.  All other systems reviewed and are negative.    Allergies  Other  Home Medications   Current Outpatient Rx  Name  Route  Sig  Dispense  Refill  . acetaminophen (TYLENOL) 325 MG tablet   Oral   Take 1 tablet (325 mg total) by mouth every 6 (six) hours as needed.         . ALPRAZolam (XANAX) 0.25 MG tablet      Take 1-2 pills, IF NEEDED, every 8 hr for anxiety/sleep   60 tablet   0   . capecitabine (XELODA) 500 MG tablet   Oral   Take by mouth 2 (two) times daily after a meal.         . cholecalciferol (VITAMIN D)  1000 UNITS tablet   Oral   Take 1,000 Units by mouth daily.         Marland Kitchen docusate sodium 100 MG CAPS   Oral   Take 100 mg by mouth 2 (two) times daily.   10 capsule   0   . dronabinol (MARINOL) 2.5 MG capsule      Take 1 pill 3 times a day.   60 capsule   2   . esomeprazole (NEXIUM) 40 MG capsule   Oral   Take 40 mg by mouth daily before breakfast.         . feeding supplement (ENSURE COMPLETE) LIQD   Oral   Take 237 mLs by mouth 2 (two) times daily between meals as needed (Please offer to pt if eating <50% of meals).         Marland Kitchen HYDROcodone-acetaminophen (NORCO/VICODIN) 5-325 MG per tablet   Oral   Take 1 tablet by mouth every 6 (six) hours as needed for pain.         . hydrocortisone cream 1 %      Apply topically 2 (two) times daily to hemorrhoids.   30 g   0   .  lipase/protease/amylase (CREON-12/PANCREASE) 12000 UNITS CPEP capsule   Oral   Take 3 capsules by mouth 3 (three) times daily with meals.         . megestrol (MEGACE) 400 MG/10ML suspension   Oral   Take 10 mLs (400 mg total) by mouth 2 (two) times daily.   240 mL   0   . metoCLOPramide (REGLAN) 10 MG tablet   Oral   Take 1 tablet (10 mg total) by mouth 4 (four) times daily.   30 tablet   0   . Multiple Vitamin (MULTIVITAMIN WITH MINERALS) TABS tablet   Oral   Take 1 tablet by mouth daily.         . ondansetron (ZOFRAN) 8 MG tablet   Oral   Take 1 tablet (8 mg total) by mouth every 8 (eight) hours as needed for nausea.   20 tablet   0   . potassium chloride SA (K-DUR,KLOR-CON) 20 MEQ tablet      Take 1 tablet daily.   30 tablet   2    BP 153/45  Pulse 84  Temp(Src) 99.2 F (37.3 C) (Oral)  Resp 20  SpO2 97% Physical Exam  Nursing note and vitals reviewed. Constitutional: She is oriented to person, place, and time. She appears well-developed and well-nourished. No distress.  HENT:  Head: Normocephalic and atraumatic.  Eyes: EOM are normal. Pupils are equal, round, and reactive to light.  Neck: Normal range of motion. Neck supple.  Cardiovascular: Normal rate and regular rhythm.  Exam reveals no friction rub.   No murmur heard. Pulmonary/Chest: Effort normal and breath sounds normal. No respiratory distress. She has no wheezes. She has no rales.  Abdominal: Soft. She exhibits no distension. There is tenderness (diffuse). There is no rebound.  Musculoskeletal: Normal range of motion. She exhibits no edema.  Neurological: She is alert and oriented to person, place, and time. No cranial nerve deficit. She exhibits normal muscle tone. Coordination normal.  Skin: She is not diaphoretic.    ED Course  Procedures (including critical care time) Labs Review Labs Reviewed  CBC - Abnormal; Notable for the following:    WBC 11.2 (*)    RBC 3.44 (*)    Hemoglobin  9.3 (*)    HCT 29.3 (*)  RDW 18.1 (*)    Platelets 552 (*)    All other components within normal limits  BASIC METABOLIC PANEL - Abnormal; Notable for the following:    Sodium 132 (*)    GFR calc non Af Amer 80 (*)    All other components within normal limits  HEPATIC FUNCTION PANEL - Abnormal; Notable for the following:    Albumin 2.5 (*)    Alkaline Phosphatase 270 (*)    All other components within normal limits  LACTIC ACID, PLASMA  LIPASE, BLOOD  URINALYSIS, ROUTINE W REFLEX MICROSCOPIC   Imaging Review No results found.  EKG Interpretation   None       MDM   1. Abdominal pain   2. Dilation of biliary tract    11F with hx of pancreatic cancer presents with abdominal pain. Has been having some on/off pain with her recent chemotherapy and is now having persistent pain for the past 1.5 days. Denies fever, N/V/D. Patient has biliary drain in place as her pancreatic cancer is in the head of her pancreas. Recent ARF necessitating admission. Here AFVSS. Patient has diffuse abdominal pain. Patient's labs within normal limits. I spoke with her PCP, Dr. Timothy Lasso, who is glad she's not in renal failure. He believes her pain is likely due to the cancer. He felt imaging would be warranted, so noncontrast CT ordered since she's had a recent ARF, I do not want to risk harming her kidneys with contrast. Fluids given.  CT shows dilation of her biliary tract, which is concerning for drain failure. Patient's lab otherwise look well. Dr. Timothy Lasso, her PCP with [facility], was consulted and he will admit.     Dagmar Hait, MD 08/15/13 [id number]

## 2013-08-14 NOTE — Plan of Care (Signed)
Problem: Phase I Progression Outcomes Goal: OOB as tolerated unless otherwise ordered Outcome: Completed/Met Date Met:  08/14/13 Up with assist

## 2013-08-14 NOTE — Telephone Encounter (Signed)
Patient in the emergency department for assessment and potential admit.Radiation treatment on hold today, will check patient status tomorrow.

## 2013-08-14 NOTE — Progress Notes (Signed)
Arrived to unit

## 2013-08-14 NOTE — ED Notes (Signed)
Pancreatic cancer pt receiving chemo treatments 5 days a week, last treatment was Friday and due today.  Pt states that she was diagnosed with cancer in September and been having abd pain ever since

## 2013-08-14 NOTE — H&P (Signed)
Carolyn [name] is an 77 y.o. female.   PCP:   Gwen Pounds, MD   Chief Complaint:  Ab Pain  HPI: 57 F c Borderline Resectable Pancreatic CA undergoing Chemo and Radiation to hopefully shrink the tumor so that Dr Donell Beers may be able to do a Whipple.  I received a call from Husband today that Carolyn [name] is having more Ab pain and is not eating or drinking and may be Dehydrated again.  She also c/o of not being able to get a deep breath.  i sent her to the [facility] ED.  Here labs were better than expected.  She was given MSO4 for pain, Zofran for Nausea, IVFs and I was called by ED.  We elected to get non IV contrasted Ab CT (recent renal issues made [place] leary of the IV Contrast) and it showed Interval development of extensive intrahepatic and extrahepatic biliary ductal dilatation when compared to the recent prior examination, raising the possibility of percutaneous biliary drainage failure. The ED called IR and put Carolyn [name] on the schedule for am procedure.  I was called for inpt admit.  I will unclamp the catheter and allow drainage overnight.    Past Medical History:  Past Medical History  Diagnosis Date  . Hypertension   . Hyperlipidemia   . GERD (gastroesophageal reflux disease)   . Asthma   . Obstructive jaundice 06/2013  . Pancreatitis   . Arthritis   . Cancer   . Heart murmur   . Thyroid disease   . Pancreatic cancer 07/05/13    pancreatic head=adenocarcinoma    Past Surgical History  Procedure Laterality Date  . Neck surgery  2005/2007  . Hand surgery  2010  . Cataract extraction    . Abdominal hysterectomy    . Eus N/A 07/05/2013    Procedure: ESOPHAGEAL ENDOSCOPIC ULTRASOUND (EUS) RADIAL;  Surgeon: Willis Modena, MD;  Location: [facility];  Service: Endoscopy;  Laterality: N/A;  . Ercp N/A 07/05/2013    Procedure: ENDOSCOPIC RETROGRADE CHOLANGIOPANCREATOGRAPHY (ERCP);  Surgeon: Willis Modena, MD;  Location: [facility];  Service: Endoscopy;  Laterality: N/A;  . Buttock cyst  removal    . Cholecystectomy        Allergies:   Allergies  Allergen Reactions  . Other Other (See Comments)    Alka seltzer caused severe swelling     Medications: Prior to Admission medications   Medication Sig Start Date End Date Taking? Authorizing Provider  acetaminophen (TYLENOL) 325 MG tablet Take 1 tablet (325 mg total) by mouth every 6 (six) hours as needed. 07/28/13  Yes Gwen Pounds, MD  ALPRAZolam Prudy Feeler) 0.25 MG tablet Take 1-2 pills, IF NEEDED, every 8 hr for anxiety/sleep 08/08/13  Yes Josph Macho, MD  capecitabine (XELODA) 500 MG tablet Take by mouth 2 (two) times daily after a meal.   Yes Historical Provider, MD  cholecalciferol (VITAMIN D) 1000 UNITS tablet Take 1,000 Units by mouth daily.   Yes Historical Provider, MD  docusate sodium 100 MG CAPS Take 100 mg by mouth 2 (two) times daily. 07/07/13  Yes Gwen Pounds, MD  dronabinol (MARINOL) 2.5 MG capsule Take 1 pill 3 times a day. 07/18/13  Yes Josph Macho, MD  esomeprazole (NEXIUM) 40 MG capsule Take 40 mg by mouth daily before breakfast.   Yes Historical Provider, MD  feeding supplement (ENSURE COMPLETE) LIQD Take 237 mLs by mouth 2 (two) times daily between meals as needed (Please offer to pt if eating <  50% of meals). 07/07/13  Yes Gwen Pounds, MD  HYDROcodone-acetaminophen (NORCO/VICODIN) 5-325 MG per tablet Take 1 tablet by mouth every 6 (six) hours as needed for pain.   Yes Historical Provider, MD  hydrocortisone cream 1 % Apply topically 2 (two) times daily to hemorrhoids. 07/28/13  Yes Gwen Pounds, MD  lipase/protease/amylase (CREON-12/PANCREASE) 12000 UNITS CPEP capsule Take 3 capsules by mouth 3 (three) times daily with meals.   Yes Historical Provider, MD  megestrol (MEGACE) 400 MG/10ML suspension Take 10 mLs (400 mg total) by mouth 2 (two) times daily. 07/28/13  Yes Gwen Pounds, MD  metoCLOPramide (REGLAN) 10 MG tablet Take 1 tablet (10 mg total) by mouth 4 (four) times daily. 07/18/13  Yes Lowella Dell, MD  Multiple Vitamin (MULTIVITAMIN WITH MINERALS) TABS tablet Take 1 tablet by mouth daily. 07/07/13  Yes Gwen Pounds, MD  ondansetron (ZOFRAN) 8 MG tablet Take 1 tablet (8 mg total) by mouth every 8 (eight) hours as needed for nausea. 07/18/13  Yes Lowella Dell, MD  potassium chloride SA (K-DUR,KLOR-CON) 20 MEQ tablet Take 1 tablet daily. 08/08/13  Yes Josph Macho, MD      (Not in a hospital admission)   Social History:  reports that she quit smoking about 6 weeks ago. Her smoking use included Cigarettes. She has a 20 pack-year smoking history. She does not have any smokeless tobacco history on file. She reports that she drinks about 0.6 ounces of alcohol per week. She reports that she does not use illicit drugs.  Family History: Family History  Problem Relation Age of Onset  . Cancer Daughter     Breast    Review of Systems:  Review of Systems - See HPI. Full ROS obtained No Fever. No N/V/D.  + Anorexia + Pain. (-) CP or SOB  Physical Exam:  Blood pressure 153/45, pulse 84, temperature 99.2 F (37.3 C), temperature source Oral, resp. rate 20, SpO2 97.00%. Filed Vitals:   08/14/13 1357  BP: 153/45  Pulse: 84  Temp: 99.2 F (37.3 C)  TempSrc: Oral  Resp: 20  SpO2: 97%   General appearance: A and O - appears uncomfortable Head: Normocephalic, without obvious abnormality, atraumatic Eyes: No Icterus Nose: Nares normal. Septum midline. Mucosa normal. No drainage or sinus tenderness. OP - dry Neck: no adenopathy, no carotid bruit, no JVD and thyroid not enlarged, symmetric, no tenderness/mass/nodules Resp: CTA Cardio: Reg GI: drain capped.  CDI. Ab Tender. (-) Reb (-) Guarding. Extremities: extremities normal, atraumatic, no cyanosis or edema Pulses: 2+ and symmetric Lymph nodes: no  lymphadenopathy Neurologic: Alert and oriented X 3, normal strength and tone. Normal symmetric reflexes.     Labs on Admission:   Recent Labs  08/14/13 1430   NA 132*  K 4.1  CL 96  CO2 25  GLUCOSE 91  BUN 7  CREATININE 0.76  CALCIUM 9.7    Recent Labs  08/14/13 1430  AST 24  ALT 17  ALKPHOS 270*  BILITOT 0.7  PROT 7.4  ALBUMIN 2.5*    Recent Labs  08/14/13 1430  LIPASE 41    Recent Labs  08/14/13 1430  WBC 11.2*  HGB 9.3*  HCT 29.3*  MCV 85.2  PLT 552*   No results found for this basename: CKTOTAL, CKMB, CKMBINDEX, TROPONINI,  in the last 72 hours Lab Results  Component Value Date   INR 1.24 07/24/2013   INR 0.94 07/02/2013     LAB RESULT POCT:  Results for orders placed during the hospital encounter of 08/14/13  CBC      Result Value Range   WBC 11.2 (*) 4.0 - 10.5 K/uL   RBC 3.44 (*) 3.87 - 5.11 MIL/uL   Hemoglobin 9.3 (*) 12.0 - 15.0 g/dL   HCT 81.1 (*) 91.4 - 78.2 %   MCV 85.2  78.0 - 100.0 fL   MCH 27.0  26.0 - 34.0 pg   MCHC 31.7  30.0 - 36.0 g/dL   RDW 95.6 (*) 21.3 - 08.6 %   Platelets 552 (*) 150 - 400 K/uL  BASIC METABOLIC PANEL      Result Value Range   Sodium 132 (*) 135 - 145 mEq/L   Potassium 4.1  3.5 - 5.1 mEq/L   Chloride 96  96 - 112 mEq/L   CO2 25  19 - 32 mEq/L   Glucose, Bld 91  70 - 99 mg/dL   BUN 7  6 - 23 mg/dL   Creatinine, Ser 5.78  0.50 - 1.10 mg/dL   Calcium 9.7  8.4 - 46.9 mg/dL   GFR calc non Af Amer 80 (*) >90 mL/min   GFR calc Af Amer >90  >90 mL/min  LACTIC ACID, PLASMA      Result Value Range   Lactic Acid, Venous 1.1  0.5 - 2.2 mmol/L  HEPATIC FUNCTION PANEL      Result Value Range   Total Protein 7.4  6.0 - 8.3 g/dL   Albumin 2.5 (*) 3.5 - 5.2 g/dL   AST 24  0 - 37 U/L   ALT 17  0 - 35 U/L   Alkaline Phosphatase 270 (*) 39 - 117 U/L   Total Bilirubin 0.7  0.3 - 1.2 mg/dL   Bilirubin, Direct 0.3  0.0 - 0.3 mg/dL   Indirect Bilirubin 0.4  0.3 - 0.9 mg/dL  LIPASE, BLOOD      Result Value Range   Lipase 41  11 - 59 U/L      Radiological Exams on Admission: Ct Abdomen Pelvis Wo Contrast  08/14/2013   CLINICAL DATA:  Generalized abdominal pain.  History of pancreatic cancer.  EXAM: CT ABDOMEN AND PELVIS WITHOUT CONTRAST  TECHNIQUE: Multidetector CT imaging of the abdomen and pelvis was performed following the standard protocol without intravenous contrast.  COMPARISON:  CT abdomen pelvis 06/24/2013; 07/23/2013.  FINDINGS: Small right pleural effusion. Minimal dependent atelectasis within the left greater than right lower lobes. Normal heart size. There is a 3.7 cm thoracic aortic aneurysm.  Lack of intravenous contrast material limits evaluation of the solid organ parenchyma.  Multiple hepatic cysts, re- demonstrated. Percutaneous biliary drainage catheter courses through the left hepatic lobe with the distal tip coiled in the duodenum. There has been interval dilation of the her intrahepatic and extrahepatic biliary ductal system. Spleen is grossly unremarkable. Re- demonstrated marked dilatation of the main pancreatic duct measuring up to 1.4 cm. Known pancreatic head mass is difficult to delineate without intravenous contrast material.  Bilateral adrenal glands are unremarkable. Kidneys are symmetric in size. No hydronephrosis. Unchanged cyst within the superior pole of the left kidney. An partially exophytic cyst within the interpolar region of the right kidney.  Extensive calcification of the normal caliber abdominal aorta. No retroperitoneal lymphadenopathy. Urinary bladder is unremarkable. Descending and sigmoid colonic diverticulosis. No evidence for acute diverticulitis.  Normal appendix. No free fluid or free intraperitoneal air.  If lower lumbar spine degenerative change. No aggressive osseous lesions.  IMPRESSION: 1. Interval development  of extensive intrahepatic and extrahepatic biliary ductal dilatation when compared to the recent prior examination, raising the possibility of percutaneous biliary drainage failure. Recommend correlation with laboratory analysis and possible ultrasound as clinically indicated as lack of intravenous contrast  material somewhat limits evaluation. 2. Pancreatic head mass is not well delineated due to lack of intravenous contrast material. Persistent marked dilation of the pancreatic duct. 3. Probable multiple hepatic cysts. 4. Small right pleural effusion. 5. Distal thoracic aortic aneurysm.   Electronically Signed   By: Annia Belt M.D.   On: 08/14/2013 16:36      Orders placed during the hospital encounter of 07/23/13  . EKG 12-LEAD  . EKG 12-LEAD  . EKG 12-LEAD  . EKG 12-LEAD  . EKG 12-LEAD  . EKG 12-LEAD  . EKG 12-LEAD  . EKG 12-LEAD  . EKG  . EKG     Assessment/Plan Principal Problem:   Pancreatic cancer Active Problems:   Protein-calorie malnutrition, severe   Anemia   Leukocytosis   Intractable abdominal pain  Borderline Resectable Pancreatic CA undergoing Chemo and Radiation to hopefully shrink the tumor so that Dr Donell Beers may be able to do a Whipple on her.  Admit for pain/nausea control and fluids. She was supposed to get Chemo infusion today - Hold and restart by Thursday?? Also XRT missed today and ? If can be resumed tomorrow post IR procedure. I will inform Drs Myna Hidalgo and Mitzi Hansen of her admit.  Ab pain - S/P non IV contrasted Ab CT showing Interval development of extensive intrahepatic and extrahepatic biliary ductal dilatation when compared to the recent prior examination, raising the possibility of percutaneous biliary drainage failure. The ED called IR and put Carolyn Padia on the schedule for am procedure.  I will unclamp the catheter and allow drainage overnight. IR will see her in am. ? Mildly worse Ob Jaundice but TBili up from 2.0 - 2.5 - not remarkable.  Anorexia c prot cal malnutrition - Nutrition to see in am.  Marinol as outpt.  Liq cal supplementation.  HTN. NTD. Follow  Leukocytosis - Stable and NTD.  Anemia - follow.   Gen Weakness and AFTT - PT/OT/SW.  Bijou Easler M 08/14/2013, 6:50 PM

## 2013-08-15 ENCOUNTER — Telehealth: Payer: Self-pay | Admitting: *Deleted

## 2013-08-15 ENCOUNTER — Ambulatory Visit
Admission: RE | Admit: 2013-08-15 | Discharge: 2013-08-15 | Disposition: A | Discharge status: Home / Self Care | Payer: Medicare Other | Source: Ambulatory Visit | Attending: Radiation Oncology | Admitting: Radiation Oncology

## 2013-08-15 DIAGNOSIS — C259 Malignant neoplasm of pancreas, unspecified: Secondary | ICD-10-CM

## 2013-08-15 DIAGNOSIS — R627 Adult failure to thrive: Secondary | ICD-10-CM

## 2013-08-15 LAB — HEPATIC FUNCTION PANEL
AST: 19 U/L (ref 0–37)
Albumin: 2.1 g/dL — ABNORMAL LOW (ref 3.5–5.2)
Alkaline Phosphatase: 216 U/L — ABNORMAL HIGH (ref 39–117)
Total Bilirubin: 0.5 mg/dL (ref 0.3–1.2)
Total Protein: 6.1 g/dL (ref 6.0–8.3)

## 2013-08-15 MED ORDER — BOOST / RESOURCE BREEZE PO LIQD
1.0000 | Freq: Two times a day (BID) | ORAL | Status: DC
  Administered 2013-08-15 – 2013-08-19 (×5): 1 via ORAL

## 2013-08-15 MED ORDER — BOOST PLUS PO LIQD
237.0000 mL | Freq: Two times a day (BID) | ORAL | Status: DC
  Administered 2013-08-19: 237 mL via ORAL
  Filled 2013-08-15 (×13): qty 237

## 2013-08-15 NOTE — Progress Notes (Signed)
INITIAL NUTRITION ASSESSMENT  Pt meets criteria for severe MALNUTRITION in the context of chronic illness as evidenced by <75% estimated energy intake for the past 2 months with 7% weight loss in the past 2 weeks.  DOCUMENTATION CODES Per approved criteria  -Severe malnutrition in the context of chronic illness -Obesity Unspecified   INTERVENTION: - Boost Plus BID and Resource Breeze BID - Noted MD had recommended possible J tube during last admission - agree with this if pt/family desire aggressive nutrition as pt with inadequate PO intake x 2 months - recommend MD address with pt - Will continue to monitor   NUTRITION DIAGNOSIS: Inadequate oral intake related to poor appetite as evidenced by <10% meal intake.   Goal: Pt to consume >90% of meals/supplements  Monitor:  Weights, labs, intake, TF  Reason for Assessment: Nutrition risk, consult  77 y.o. female  Admitting Dx: Pancreatic cancer  ASSESSMENT: Pt discussed during multidisciplinary rounds. Seen by inpatient RD during admission last month. RD noted pt with poor appetite then with nausea. Has been seen by [facility] RD. Noted pt with 25-50% meal intake during admission the end of September 2014 and reported eating primarily saltine crackers for 3 weeks PTA. Intake during admission last month was 25-50% of meals. Was d/c 07/28/13 and re-admitted yesterday for abdominal pain.   Pt with hx of borderline resectable pancreatic CA undergoing chemoradiation in hopes of tumor shrinkage for possible Whipple procedure. On Marinol PTA. Per conversation today with pt and family, pt not eating well at home. Pt reports poor intake for the past few days but has been drinking 2 Boost/day. Reports weight has been up and down. Reports abdominal pain better but is worse when eating. Denies any nausea. Only had bites of jello consumed on lunch tray. Interested in Futures trader. Pt's weight down 15 pounds unintentionally in  the past month.   - Sodium slightly low - Alk phos elevated  Height: Ht Readings from Last 1 Encounters:  08/14/13 5\' 3"  (1.6 m)    Weight: Wt Readings from Last 1 Encounters:  08/14/13 199 lb 8.3 oz (90.5 kg)    Ideal Body Weight: 115 lb   % Ideal Body Weight: 173%  Wt Readings from Last 10 Encounters:  08/14/13 199 lb 8.3 oz (90.5 kg)  08/09/13 213 lb 6.4 oz (96.798 kg)  08/08/13 212 lb (96.163 kg)  08/04/13 214 lb (97.07 kg)  07/28/13 211 lb 6.7 oz (95.9 kg)  07/19/13 196 lb 4.8 oz (89.041 kg)  07/18/13 196 lb (88.905 kg)  07/12/13 198 lb 9.6 oz (90.084 kg)  07/02/13 187 lb (84.823 kg)  07/02/13 187 lb (84.823 kg)    Usual Body Weight: 215 lb   % Usual Body Weight: 92%  BMI:  Body mass index is 35.35 kg/(m^2). Class II obesity  Estimated Nutritional Needs: Kcal: 1700-1900 Protein: 80-100g Fluid: 1.7-1.9L/day  Skin: Non-pitting RLE, LLE edema  Diet Order: General  EDUCATION NEEDS: -No education needs identified at this time   Intake/Output Summary (Last 24 hours) at 08/15/13 1329 Last data filed at 08/15/13 0900  Gross per 24 hour  Intake 1111.25 ml  Output   1800 ml  Net -688.75 ml    Last BM: 11/10  Labs:   Recent Labs Lab 08/14/13 1430  NA 132*  K 4.1  CL 96  CO2 25  BUN 7  CREATININE 0.76  CALCIUM 9.7  GLUCOSE 91    CBG (last 3)  No results found for  this basename: GLUCAP,  in the last 72 hours  Scheduled Meds: . cholecalciferol  1,000 Units Oral Daily  . docusate sodium  100 mg Oral BID  . heparin  5,000 Units Subcutaneous Q8H  . lipase/protease/amylase  3 capsule Oral TID WC  . multivitamin with minerals  1 tablet Oral Daily  . pantoprazole  40 mg Oral Daily    Continuous Infusions: . sodium chloride 50 mL/hr at 08/15/13 6295    Past Medical History  Diagnosis Date  . Hypertension   . Hyperlipidemia   . GERD (gastroesophageal reflux disease)   . Asthma   . Obstructive jaundice 06/2013  . Pancreatitis   .  Arthritis   . Cancer   . Heart murmur   . Thyroid disease   . Pancreatic cancer 07/05/13    pancreatic head=adenocarcinoma    Past Surgical History  Procedure Laterality Date  . Neck surgery  2005/2007  . Hand surgery  2010  . Cataract extraction    . Abdominal hysterectomy    . Eus N/A 07/05/2013    Procedure: ESOPHAGEAL ENDOSCOPIC ULTRASOUND (EUS) RADIAL;  Surgeon: Willis Modena, MD;  Location: [facility]OSCOPY;  Service: Endoscopy;  Laterality: N/A;  . Ercp N/A 07/05/2013    Procedure: ENDOSCOPIC RETROGRADE CHOLANGIOPANCREATOGRAPHY (ERCP);  Surgeon: Willis Modena, MD;  Location: [facility];  Service: Endoscopy;  Laterality: N/A;  . Buttock cyst removal    . Cholecystectomy      Levon Hedger MS, RD, LDN [phone] Pager [phone] After Hours Pager

## 2013-08-15 NOTE — Progress Notes (Signed)
Attempted to see pt today. Spoke with her at length about the need to get up and move around a little bit to keep some strength.  Family in agreement but pt not wanting to participate today.  Will attempt back as schedule allows. Tory Emerald, [place] [phone]

## 2013-08-15 NOTE — Progress Notes (Signed)
PT Cancellation Note  Patient Details Name: Carolyn [name] MRN: [id number] DOB: 22-Jul-1936   Cancelled Treatment:    Reason Eval/Treat Not Completed: Fatigue/lethargy limiting ability to participate;Pain limiting ability to participate. Pt stated she hasn't slept in days and isn't up to therapy right now. Will follow.   Ralene Bathe Kistler 08/15/2013, 10:18 AM [phone]

## 2013-08-15 NOTE — Telephone Encounter (Signed)
Called the floor 3 [place] spoke with patient's nurse,patient  in room 1312, asked if patient could come to radiation therapy scheduled for today at 3:40pm, , 'Yes, she should be able to come for that,only thing scheduled for IR for her biliary drain",Carolyn will keep [place] posted, called linac 4 and informed Faith, patient status 8:31 AM

## 2013-08-15 NOTE — Progress Notes (Addendum)
Subjective: Admitted last night c [name] c AFTT, decreased PO intake and CT Ab c/w Interval development of extensive intrahepatic and extrahepatic biliary ductal dilatation when compared to the recent prior examination, raising the possibility of percutaneous biliary drainage failure. I unclamped the Biliary drain and she has had 550 cc biliary output overnight and looks and feels better this am.  No Fever.  No SOB  Objective: Vital signs in last 24 hours: Temp:  [98.9 F (37.2 C)-99.2 F (37.3 C)] 98.9 F (37.2 C) (11/11 0516) Pulse Rate:  [80-88] 87 (11/11 0516) Resp:  [16-20] 16 (11/11 0516) BP: (133-158)/(38-59) 157/59 mmHg (11/11 0516) SpO2:  [94 %-98 %] 94 % (11/11 0516) Weight:  [90.5 kg (199 lb 8.3 oz)] 90.5 kg (199 lb 8.3 oz) (11/10 2014) Weight change:  Last BM Date: 08/14/13  CBG (last 3)  No results found for this basename: GLUCAP,  in the last 72 hours  Intake/Output from previous day:  Intake/Output Summary (Last 24 hours) at 08/15/13 0719 Last data filed at 08/15/13 4098  Gross per 24 hour  Intake 1111.25 ml  Output   1600 ml  Net -488.75 ml   11/10 0701 - 11/11 0700 In: 1111.3 [P.O.:340; I.V.:761.3] Out: 1600 [Urine:1075; Drains:525]   Physical Exam  General appearance: more awake and alert Eyes: no scleral icterus Throat: oropharynx moist without erythema Resp: CTA Cardio: Reg GI: Mild tender but less than last night. Bil drain CDI Extremities: no clubbing, cyanosis or edema   Lab Results:  Recent Labs  08/14/13 1430  NA 132*  K 4.1  CL 96  CO2 25  GLUCOSE 91  BUN 7  CREATININE 0.76  CALCIUM 9.7     Recent Labs  08/14/13 1430  AST 24  ALT 17  ALKPHOS 270*  BILITOT 0.7  PROT 7.4  ALBUMIN 2.5*     Recent Labs  08/14/13 1430  WBC 11.2*  HGB 9.3*  HCT 29.3*  MCV 85.2  PLT 552*    Lab Results  Component Value Date   INR 1.24 07/24/2013   INR 0.94 07/02/2013    No results found for this basename: CKTOTAL,  CKMB, CKMBINDEX, TROPONINI,  in the last 72 hours  No results found for this basename: TSH, T4TOTAL, FREET3, T3FREE, THYROIDAB,  in the last 72 hours  No results found for this basename: VITAMINB12, FOLATE, FERRITIN, TIBC, IRON, RETICCTPCT,  in the last 72 hours  Micro Results: No results found for this or any previous visit (from the past 240 hour(s)).   Studies/Results: Ct Abdomen Pelvis Wo Contrast  08/14/2013   CLINICAL DATA:  Generalized abdominal pain. History of pancreatic cancer.  EXAM: CT ABDOMEN AND PELVIS WITHOUT CONTRAST  TECHNIQUE: Multidetector CT imaging of the abdomen and pelvis was performed following the standard protocol without intravenous contrast.  COMPARISON:  CT abdomen pelvis 06/24/2013; 07/23/2013.  FINDINGS: Small right pleural effusion. Minimal dependent atelectasis within the left greater than right lower lobes. Normal heart size. There is a 3.7 cm thoracic aortic aneurysm.  Lack of intravenous contrast material limits evaluation of the solid organ parenchyma.  Multiple hepatic cysts, re- demonstrated. Percutaneous biliary drainage catheter courses through the left hepatic lobe with the distal tip coiled in the duodenum. There has been interval dilation of the her intrahepatic and extrahepatic biliary ductal system. Spleen is grossly unremarkable. Re- demonstrated marked dilatation of the main pancreatic duct measuring up to 1.4 cm. Known pancreatic head mass is difficult to delineate without intravenous contrast material.  Bilateral adrenal glands are unremarkable. Kidneys are symmetric in size. No hydronephrosis. Unchanged cyst within the superior pole of the left kidney. An partially exophytic cyst within the interpolar region of the right kidney.  Extensive calcification of the normal caliber abdominal aorta. No retroperitoneal lymphadenopathy. Urinary bladder is unremarkable. Descending and sigmoid colonic diverticulosis. No evidence for acute diverticulitis.  Normal  appendix. No free fluid or free intraperitoneal air.  If lower lumbar spine degenerative change. No aggressive osseous lesions.  IMPRESSION: 1. Interval development of extensive intrahepatic and extrahepatic biliary ductal dilatation when compared to the recent prior examination, raising the possibility of percutaneous biliary drainage failure. Recommend correlation with laboratory analysis and possible ultrasound as clinically indicated as lack of intravenous contrast material somewhat limits evaluation. 2. Pancreatic head mass is not well delineated due to lack of intravenous contrast material. Persistent marked dilation of the pancreatic duct. 3. Probable multiple hepatic cysts. 4. Small right pleural effusion. 5. Distal thoracic aortic aneurysm.   Electronically Signed   By: Annia Belt M.D.   On: 08/14/2013 16:36     Medications: Scheduled: . cholecalciferol  1,000 Units Oral Daily  . docusate sodium  100 mg Oral BID  . heparin  5,000 Units Subcutaneous Q8H  . lipase/protease/amylase  3 capsule Oral TID WC  . multivitamin with minerals  1 tablet Oral Daily  . pantoprazole  40 mg Oral Daily   Continuous: . sodium chloride 1,000 mL (08/14/13 2024)     Assessment/Plan: Principal Problem:   Pancreatic cancer Active Problems:   Protein-calorie malnutrition, severe   Anemia   Leukocytosis   Intractable abdominal pain  Borderline Resectable Pancreatic CA undergoing Chemo and Radiation to hopefully shrink the tumor so that Dr Donell Beers may be able to do a Whipple on her. Pain/nausea controlled and on IV fluids.  She was supposed to get Chemo infusion 11/10 - Held and restart by Thursday??  Also XRT missed today and ? If can be resumed today/tomorrow post IR procedure.  I cced my H and P to Drs Myna Hidalgo and Mitzi Hansen and put Ms Acrey on Dr Tama Gander rounding list (I know he may not be making rounds this week - I just want him to be informed of admission and plan). Ab pain - S/P non IV contrasted Ab  CT showing Interval development of extensive intrahepatic and extrahepatic biliary ductal dilatation when compared to the recent prior examination, raising the possibility of percutaneous biliary drainage failure. The ED called IR and put Ms Coggeshall on the schedule for am procedure. I unclamped her catheter and allow drainage overnight.She had 500-550 ccs drainage and feels better this am.  IR will see her in today. ? Mildly worse Ob Jaundice but TBili up from 2.0 - 2.5 - not remarkable. I went down and discussed case c IR. We decided not to change out the catheter as She had good Output last night and feels better.  We decided to re-check LFTs now and repeat labs in am.  We decided to clamp the catheter again and follow for Sxs return.  IR will see pt today and leave recs.  We may need to open up the drainage catheter every so often and allow drainage to alleviate her Sxs.  Leaving it open to the bag causes lots of insensible losses and became responsible for last hospital stay c Severe pre-renal Azotemia/ARF. Anorexia c prot cal malnutrition - Nutrition to see in am. Marinol as outpt. Liq cal supplementation.  HTN. NTD. Follow  Leukocytosis -  Stable and NTD.  Anemia - follow.  Gen Weakness and AFTT - PT/OT/SW.  Will get IR to see her.  Try and get her eating.  Work on function.  Hopefully home tomorrow?  DVT Prophylaxis - SQ hep post procedure.  SCDs.     LOS: 1 day   Pinchus Weckwerth M 08/15/2013, 7:19 AM

## 2013-08-15 NOTE — Progress Notes (Signed)
About 1230 patient stated pain was not relieved by pain medications and that near her biliary drain she was very itchy. I left a message with Timothy Lasso MD's nurse and Pattricia Boss PA. After my discussion with Pattricia Boss PA she recommended that I unclamp patient's biliary drain and re-attach her to gravity bag. Orders that came from Otho MD stated to do so as well.  Patient has been attached to gravity bag since then. Do not see an order to re-clamp patient. Will continue to monitor patient and report this off to night shift.

## 2013-08-15 NOTE — Progress Notes (Signed)
[facility] Radiation Oncology Dept Therapy Treatment Record Phone [phone]   Radiation Therapy was administered to Carolyn [name] on: 08/15/2013  4:01 PM and was treatment # *10** out of a planned course of **28* treatments.

## 2013-08-15 NOTE — Progress Notes (Signed)
Patient with PMHx of pancreatic cancer undergoing chemotherapy and radiation s/p 7F internal external biliary drain placement 07/06/13. Patient presented to ED 07/23/13 with pre-renal ARF after having drain open, it was decided at that time to cap the biliary drain and assess how well she was draining internally.  The patient most recently presented with abdominal pain on 11/10, CT of the abdomen/pelvis reveal intrahepatic and extrahepatic biliary ductal dilatation with compared to previous, however total bilirubin 0.7  The biliary drain is noted to be in appropriate position.  The patient's biliary drain was unclamped and drained 525cc overnight and 100cc so far today. She is feeling much better today and states her abdominal pain is minimal now. Given that the drain is well positioned and the total bilirubin is wnl, we would recommend no intervention at this time. May consider uncapping the biliary drain every so often when symptoms arise, however caution with prolonged external drainage given history of pre-renal ARF. This has been d/w Dr. Grace Isaac today.  Pattricia Boss PA-C Interventional Radiology  08/15/13  8:54 AM

## 2013-08-15 NOTE — Consult Note (Signed)
#   [id number] is consult note.  I'm going to stop the Xeloda on her. It's possible this might be causing some of the problems.. I know there is a component of anxiety. She might need to be more than Xanax to try to help with this.  I don't think she's septic.  Her CT scan certainly didn't show anything that was progressive with her cancer.  I talked to her at length this morning. I told her that I just do not feel that she was ever going to be a candidate for surgery. Without surgery, she knows that she will not be curative. She understands this. She just wants to feel better.  We will follow along closely. I would try to continue the radiation if possible.  Pete E.

## 2013-08-16 ENCOUNTER — Ambulatory Visit
Admission: RE | Admit: 2013-08-16 | Discharge: 2013-08-16 | Disposition: A | Discharge status: Home / Self Care | Payer: Medicare Other | Source: Ambulatory Visit | Attending: Radiation Oncology | Admitting: Radiation Oncology

## 2013-08-16 LAB — CBC
HCT: 28.1 % — ABNORMAL LOW (ref 36.0–46.0)
Hemoglobin: 8.8 g/dL — ABNORMAL LOW (ref 12.0–15.0)
MCH: 26.7 pg (ref 26.0–34.0)
MCHC: 31.3 g/dL (ref 30.0–36.0)
Platelets: 491 10*3/uL — ABNORMAL HIGH (ref 150–400)
RDW: 18 % — ABNORMAL HIGH (ref 11.5–15.5)
WBC: 13 10*3/uL — ABNORMAL HIGH (ref 4.0–10.5)

## 2013-08-16 LAB — COMPREHENSIVE METABOLIC PANEL
ALT: 15 U/L (ref 0–35)
Alkaline Phosphatase: 223 U/L — ABNORMAL HIGH (ref 39–117)
BUN: 7 mg/dL (ref 6–23)
CO2: 23 mEq/L (ref 19–32)
Calcium: 9.2 mg/dL (ref 8.4–10.5)
Chloride: 100 mEq/L (ref 96–112)
GFR calc Af Amer: >90 mL/min (ref >90)
GFR calc non Af Amer: 80 mL/min — ABNORMAL LOW (ref >90)
Glucose, Bld: 99 mg/dL (ref 70–99)
Potassium: 3.4 mEq/L — ABNORMAL LOW (ref 3.5–5.1)
Sodium: 134 mEq/L — ABNORMAL LOW (ref 135–145)
Total Bilirubin: 0.6 mg/dL (ref 0.3–1.2)
Total Protein: 7 g/dL (ref 6.0–8.3)

## 2013-08-16 MED ORDER — ONDANSETRON HCL 4 MG PO TABS
4.0000 mg | ORAL_TABLET | Freq: Three times a day (TID) | ORAL | Status: DC | PRN
Start: 2013-08-16 — End: 2013-08-21

## 2013-08-16 MED ORDER — FENTANYL 25 MCG/HR TD PT72
25.0000 ug | MEDICATED_PATCH | TRANSDERMAL | Status: DC
  Administered 2013-08-16: 25 ug via TRANSDERMAL
  Filled 2013-08-16: qty 1

## 2013-08-16 MED ORDER — POTASSIUM CHLORIDE CRYS ER 20 MEQ PO TBCR
40.0000 meq | EXTENDED_RELEASE_TABLET | Freq: Two times a day (BID) | ORAL | Status: DC
  Administered 2013-08-16 – 2013-08-21 (×11): 40 meq via ORAL
  Filled 2013-08-16 (×12): qty 2

## 2013-08-16 MED ORDER — METOCLOPRAMIDE HCL 5 MG PO TABS
5.0000 mg | ORAL_TABLET | Freq: Three times a day (TID) | ORAL | Status: DC
  Administered 2013-08-16 – 2013-08-21 (×16): 5 mg via ORAL
  Filled 2013-08-16 (×18): qty 1

## 2013-08-16 NOTE — Progress Notes (Signed)
[facility] Radiation Oncology Dept Therapy Treatment Record Phone [phone]   Radiation Therapy was administered to Carolyn [name] on: 08/16/2013  4:22 PM and was treatment #11 out of a planned course of 28 treatments.

## 2013-08-16 NOTE — Progress Notes (Signed)
PT Cancellation Note  Patient Details Name: Carolyn [name] MRN: [id number] DOB: December 09, 1935   Cancelled Treatment:    Reason Eval/Treat Not Completed: Fatigue/lethargy limiting ability to participate (reports just got back from her treatment.)  PT eval in AM   Rada Hay 08/16/2013, 4:14 PM Blanchard Kelch PT [phone]

## 2013-08-16 NOTE — Progress Notes (Addendum)
Subjective: Admitted c [name], AFTT, decreased PO intake and CT Ab c/w Interval development of extensive intrahepatic and extrahepatic biliary ductal dilatation when compared to the recent prior examination, raising the possibility of percutaneous biliary drainage failure. I unclamped the Biliary drain and she  had 550 cc biliary output overnight and looked and felt better yesterday.  She was seen by Onc and by IR yesterday.  We re-clamped the tube and pain got worse.  She was unclamped again and drained another 500-600 ccs out.  Hard to know if the pain is related or not.  We had a long discussion @ QOL, pain, end of life and treatment goals. We will make her a DNR Now.  Dr Myna Hidalgo stopped the Chemo.  She can still do XRT.  We discussed that the chances she makes it to surgery are slim.  We brought up [facility]/Palliative care.  The only reason we do a PEJ is to by time for an eventual surgery, but if she will not make it to surgery a PEJ may prolong suffering.  I will try and get her more comfortable today.  No Fever.  No SOB  She has been getting IV MSO4 for pain She is not sleeping. She is eating poorly  Objective: Vital signs in last 24 hours: Temp:  [98.7 F (37.1 C)-99.2 F (37.3 C)] 98.7 F (37.1 C) (11/12 0525) Pulse Rate:  [80-103] 85 (11/12 0525) Resp:  [16-20] 20 (11/12 0525) BP: (173-178)/(43-53) 178/53 mmHg (11/11 2259) SpO2:  [95 %-98 %] 95 % (11/12 0525) Weight change:  Last BM Date: 08/14/13  CBG (last 3)  No results found for this basename: GLUCAP,  in the last 72 hours  Intake/Output from previous day:  Intake/Output Summary (Last 24 hours) at 08/16/13 0745 Last data filed at 08/16/13 0031  Gross per 24 hour  Intake 648.33 ml  Output   1445 ml  Net -796.67 ml   11/11 0701 - 11/12 0700 In: 648.3 [I.V.:648.3] Out: 1445 [Urine:1100; Drains:345]   Physical Exam  General appearance: more awake and alert but still as she is in pain. Eyes: no  scleral icterus Throat: oropharynx moist without erythema Resp: CTA Cardio: Reg GI: Mild tender but less.  Biliary drain is CDI - open and draining. Extremities: no clubbing, cyanosis or edema    Lab Results:  Recent Labs  08/14/13 1430 08/16/13 0350  NA 132* 134*  K 4.1 3.4*  CL 96 100  CO2 25 23  GLUCOSE 91 99  BUN 7 7  CREATININE 0.76 0.78  CALCIUM 9.7 9.2     Recent Labs  08/15/13 0828 08/16/13 0350  AST 19 21  ALT 14 15  ALKPHOS 216* 223*  BILITOT 0.5 0.6  PROT 6.1 7.0  ALBUMIN 2.1* 2.3*     Recent Labs  08/14/13 1430 08/16/13 0350  WBC 11.2* 13.0*  HGB 9.3* 8.8*  HCT 29.3* 28.1*  MCV 85.2 85.2  PLT 552* 491*    Lab Results  Component Value Date   INR 1.24 07/24/2013   INR 0.94 07/02/2013    No results found for this basename: CKTOTAL, CKMB, CKMBINDEX, TROPONINI,  in the last 72 hours  No results found for this basename: TSH, T4TOTAL, FREET3, T3FREE, THYROIDAB,  in the last 72 hours  No results found for this basename: VITAMINB12, FOLATE, FERRITIN, TIBC, IRON, RETICCTPCT,  in the last 72 hours  Micro Results: No results found for this or any previous visit (from the past 240 hour(s)).  Studies/Results: Ct Abdomen Pelvis Wo Contrast  08/14/2013   CLINICAL DATA:  Generalized abdominal pain. History of pancreatic cancer.  EXAM: CT ABDOMEN AND PELVIS WITHOUT CONTRAST  TECHNIQUE: Multidetector CT imaging of the abdomen and pelvis was performed following the standard protocol without intravenous contrast.  COMPARISON:  CT abdomen pelvis 06/24/2013; 07/23/2013.  FINDINGS: Small right pleural effusion. Minimal dependent atelectasis within the left greater than right lower lobes. Normal heart size. There is a 3.7 cm thoracic aortic aneurysm.  Lack of intravenous contrast material limits evaluation of the solid organ parenchyma.  Multiple hepatic cysts, re- demonstrated. Percutaneous biliary drainage catheter courses through the left hepatic lobe with  the distal tip coiled in the duodenum. There has been interval dilation of the her intrahepatic and extrahepatic biliary ductal system. Spleen is grossly unremarkable. Re- demonstrated marked dilatation of the main pancreatic duct measuring up to 1.4 cm. Known pancreatic head mass is difficult to delineate without intravenous contrast material.  Bilateral adrenal glands are unremarkable. Kidneys are symmetric in size. No hydronephrosis. Unchanged cyst within the superior pole of the left kidney. An partially exophytic cyst within the interpolar region of the right kidney.  Extensive calcification of the normal caliber abdominal aorta. No retroperitoneal lymphadenopathy. Urinary bladder is unremarkable. Descending and sigmoid colonic diverticulosis. No evidence for acute diverticulitis.  Normal appendix. No free fluid or free intraperitoneal air.  If lower lumbar spine degenerative change. No aggressive osseous lesions.  IMPRESSION: 1. Interval development of extensive intrahepatic and extrahepatic biliary ductal dilatation when compared to the recent prior examination, raising the possibility of percutaneous biliary drainage failure. Recommend correlation with laboratory analysis and possible ultrasound as clinically indicated as lack of intravenous contrast material somewhat limits evaluation. 2. Pancreatic head mass is not well delineated due to lack of intravenous contrast material. Persistent marked dilation of the pancreatic duct. 3. Probable multiple hepatic cysts. 4. Small right pleural effusion. 5. Distal thoracic aortic aneurysm.   Electronically Signed   By: Annia Belt M.D.   On: 08/14/2013 16:36     Medications: Scheduled: . cholecalciferol  1,000 Units Oral Daily  . docusate sodium  100 mg Oral BID  . feeding supplement (RESOURCE BREEZE)  1 Container Oral BID BM  . heparin  5,000 Units Subcutaneous Q8H  . lactose free nutrition  237 mL Oral BID BM  . lipase/protease/amylase  3 capsule Oral TID  WC  . multivitamin with minerals  1 tablet Oral Daily  . pantoprazole  40 mg Oral Daily   Continuous: . sodium chloride 50 mL/hr at 08/15/13 1610     Assessment/Plan: Principal Problem:   Pancreatic cancer Active Problems:   Protein-calorie malnutrition, severe   Anemia   Leukocytosis   Intractable abdominal pain  Borderline Resectable Pancreatic CA - Attempt better Pain/nausea control. IVF for now. S/P 10/28 XRT yesterday Chemo on Hold and may be d/ced per Onc - Dr Myna Hidalgo. DNR  Ab pain - Will clamp/Unclamp catheter as needed to allow drainage if felt that Biliary fluid pressure contributing to pain. Appreciate IR Input Continue IV MSO4 - add Duragesic patch for pain control c prn PO Hydrocodone c the MSO4 IV for prn use. I Brought up with her as to whether [facility]ospice Palliative Care is more appropriate given her condition - she will think @ it and we will continue our conversation tomorrow.  Anorexia c prot cal malnutrition - Nutrition to see in am. Marinol as outpt. Liq cal supplementation. I brought back up the PEJ.  The  only reason we would do a PEJ is to buy time for an eventual surgery, but if she will not make it to surgery a PEJ may prolong suffering.  HTN. NTD. Follow  Leukocytosis - Stable and NTD.  Anemia c Hbg 8.8 - follow.  Hypokalemia - Replete Gen Weakness and AFTT - PT/OT/SW.  Nausea - Reglan standing c IV and oral Zofran prn.  Work on function - solidify plan.  Hopefully home tomorrow?  DVT Prophylaxis - SQ hep post procedure.  SCDs.  I tried to call Drs Mitzi Hansen and Dr Donell Beers but they were both in meetings so i will cc them this note and try to get in touch c them later [phone] is my cell).  I did get in touch c Dr Myna Hidalgo and we will finish the XRT @2 -3 weeks left.  We can decide on hospice or aggressive treatments at that time unless she further declares herself.  CA19-9 orded.  Dr Myna Hidalgo stated he will call Dr Dulce Sellar to see @ "Block" to reduce pain.    LOS: 2 days   Carolyn [name] M 08/16/2013, 7:45 AM

## 2013-08-16 NOTE — Progress Notes (Signed)
CSW received referral that pt may require more assistance at discharge.  CSW reviewed chart and noted that MD has been discussing potential for hospice/palliative care with pt and further discussions pending.  CSW awaiting PT/OT evaluations to determine how to assist with disposition planning.  Jacklynn Lewis, MSW, LCSWA  Clinical Social Work [phone]

## 2013-08-16 NOTE — Evaluation (Signed)
Occupational Therapy Evaluation Patient Details Name: TEDDI BADALAMENTI MRN: [id number] DOB: [birth year] Today's Date: 08/16/2013 Time: 3086-5784 OT Time Calculation (min): 19 min  OT Assessment / Plan / Recommendation History of present illness Admitted c [name], AFTT, decreased PO intake and CT Ab c/w Interval development of extensive intrahepatic and extrahepatic biliary ductal dilatation when compared to the recent prior examination, raising the possibility of percutaneous biliary drainage failure. I unclamped the Biliary drain and she  had 550 cc biliary output overnight and looked and felt better yesterday.  She was seen by Onc and by IR yesterday.  We re-clamped the tube and pain got worse.  She was unclamped again and drained another 500-600 ccs out.  Hard to know if the pain is related or not.   Clinical Impression   Pt will benefit from skilled OT to increase I with ADL activity and return to PLOF due to problems listed below    OT Assessment  Patient needs continued OT Services    Follow Up Recommendations  Home health OT;Supervision/Assistance - 24 hour;SNF;Other (comment) (depending on families ability to A)       Equipment Recommendations  None recommended by OT       Frequency  Min 2X/week    Precautions / Restrictions Precautions Precautions: Fall       ADL  Eating/Feeding: Moderate assistance (due to attentional deficits) Where Assessed - Eating/Feeding: Bed level Grooming: Wash/dry hands;Wash/dry face;Teeth care;Brushing hair;Maximal assistance Where Assessed - Grooming: Supported sitting;Unsupported sitting Upper Body Bathing: Maximal assistance Where Assessed - Upper Body Bathing: Unsupported sitting;Supported sitting Lower Body Bathing: Maximal assistance Where Assessed - Lower Body Bathing: Supported sit to stand Where Assessed - Upper Body Dressing: Unsupported sitting Lower Body Dressing: Maximal assistance Where Assessed - Lower Body  Dressing: Supported sit to stand Toilet Transfer: Minimal assistance Toilet Transfer Method: Sit to Barista: Comfort height toilet;Grab bars Toileting - Clothing Manipulation and Hygiene: Minimal assistance Where Assessed - Glass blower/designer Manipulation and Hygiene: Standing Transfers/Ambulation Related to ADLs: min A ADL Comments: Pt requires max A due to cognitive deficits.  Takes an excessively long time to initiate activity and seldomly completes task.  It took 40 mins for pt to move to EOB, ambulate to BR, toilet, and return to bed    OT Diagnosis: Generalized weakness;Cognitive deficits;Acute pain  OT Problem List: Decreased strength;Decreased activity tolerance;Impaired balance (sitting and/or standing);Decreased cognition;Decreased safety awareness;Decreased knowledge of use of DME or AE;Pain OT Treatment Interventions: Self-care/ADL training;DME and/or AE instruction;Therapeutic activities;Cognitive remediation/compensation;Patient/family education;Balance training   OT Goals(Current goals can be found in the care plan section) Acute Rehab OT Goals Patient Stated Goal: feel better  Visit Information  Last OT Received On: 08/16/13 History of Present Illness: Admitted c [name], AFTT, decreased PO intake and CT Ab c/w Interval development of extensive intrahepatic and extrahepatic biliary ductal dilatation when compared to the recent prior examination, raising the possibility of percutaneous biliary drainage failure. I unclamped the Biliary drain and she  had 550 cc biliary output overnight and looked and felt better yesterday.  She was seen by Onc and by IR yesterday.  We re-clamped the tube and pain got worse.  She was unclamped again and drained another 500-600 ccs out.  Hard to know if the pain is related or not.       Prior Functioning     Home Living Family/patient expects to be discharged to:: Private residence Living Arrangements:  Spouse/significant other Available Help  at Discharge: Family Type of Home: House Home Access: Stairs to enter Secretary/administrator of Steps: 5 Entrance Stairs-Rails: Right Home Layout: One level Home Equipment: None Prior Function Level of Independence: Independent Communication Communication: No difficulties         Vision/Perception Vision - History Patient Visual Report: No change from baseline   Cognition  Cognition Arousal/Alertness: Awake/alert Behavior During Therapy: Flat affect Overall Cognitive Status: Within Functional Limits for tasks assessed       Mobility Bed Mobility Bed Mobility: Sit to Supine Sit to Supine: 4: Min guard Details for Bed Mobility Assistance: No external assist given. Pt was able to get back into bed but with some difficulty and awkwardness noted Transfers Transfers: Sit to Stand;Stand to Sit Sit to Stand: 4: Min assist;From bed;From chair/3-in-1 Stand to Sit: To toilet;To bed;4: Min assist;With upper extremity assist Details for Transfer Assistance: increased time.  Min A to full rise and for balance/safety           End of Session OT - End of Session Activity Tolerance: Treatment limited secondary to medical complications (Comment);Other (comment) (nausea) Patient left: in bed;with call bell/phone within reach;with bed alarm set;with family/visitor present Nurse Communication: Mobility status  GO     Alba Cory 08/16/2013, 11:37 AM

## 2013-08-17 ENCOUNTER — Ambulatory Visit
Admission: RE | Admit: 2013-08-17 | Discharge: 2013-08-17 | Disposition: A | Discharge status: Home / Self Care | Payer: Medicare Other | Source: Ambulatory Visit | Attending: Radiation Oncology | Admitting: Radiation Oncology

## 2013-08-17 LAB — PREALBUMIN: Prealbumin: 6.1 mg/dL — ABNORMAL LOW (ref 17.0–34.0)

## 2013-08-17 LAB — CANCER ANTIGEN 19-9: CA 19-9: <1.2 U/mL — ABNORMAL LOW (ref <35.0)

## 2013-08-17 MED ORDER — MIRTAZAPINE 30 MG PO TABS
30.0000 mg | ORAL_TABLET | Freq: Every day | ORAL | Status: DC
  Administered 2013-08-17 – 2013-08-20 (×4): 30 mg via ORAL
  Filled 2013-08-17 (×5): qty 1

## 2013-08-17 NOTE — Care Management Note (Signed)
Cm spoke with patient at the bedside concerning discharge planning. Pt recommends HHPT/24 hr supervision. Per pt resides home with spouse whom will assist with home care. Pt provided with choice list for [facility]. Per pt previously active with [facility]. Pt states having access to RW. Pt to discuss further dc plans with spouse. Pt will require MD orders for HHPT upon discharge.    Carolyn [name] Otilio Groleau,RN,MSN [phone]

## 2013-08-17 NOTE — Progress Notes (Signed)
Carolyn [name] is feeling a little better today. He did have radiation yesterday.  Her abdominal pain has is doing better. This may have some to do with her biliary drain been repositioned. Maybe she will not need a upper endoscopy with celiac block. I do not think that she's taking much in the way of pain medicine.  There is clearly an element of depression. I'll put her on Remeron which hopefully will help with this and also her appetite.  Physical therapy is very key. Hopefully she will try this again today.  I had a long talk with her daughter Carolyn [name] he was up in [place]. She is coming down this weekend. I told her that we just I have a very difficult time with getting her mom her therapy. I told I just do not feel that she would ever be a surgical candidate. I told that we're stopping the oral chemotherapy and that she will try to continue radiation.  Carolyn [name] does feel that depression is a problem and does feel she needs to be on a antidepressant. She also agrees with physical therapy.  Carolyn [name] vital signs looked pretty good. Her blood pressure is up to 181/60. No labs are back yet.  I find no changes on her physical exam. Her abdomen is soft. I really cannot elicit much tenderness to palpation. Bowel sounds are present, may be slightly decreased. She has no fluid wave. Cardiac exam is regular and rhythm. Lungs are clear.  Hopefully, Carolyn [name] is turning the corner a little bit. She does seem to be a little better today.  Of note is DO NOT RESUSCITATE status. I certainly cannot argue with this.  I think the real "crossroads" will be if she can finish radiation without delays and see what her follow scan shows.  We will see her lab work looks like.  Max Fickle 1:37

## 2013-08-17 NOTE — Evaluation (Signed)
Physical Therapy Evaluation Patient Details Name: Carolyn [name] MRN: [id number] DOB: 07-May-1936 Today's Date: 08/17/2013 Time: 6213-0865 PT Time Calculation (min): 30 min  PT Assessment / Plan / Recommendation History of Present Illness  Admitted c [name] on 08/14/13,  decreased PO intake and CT Ab c/w Interval development of extensive intrahepatic and extrahepatic biliary ductal dilatation   Clinical Impression  Pt moving much better today as compared to OT eval yesterday. Pt will benefit from PT while in acute care to improve functional mobility. Pt did not c/o dizziness while ambulating, just weak.    PT Assessment  Patient needs continued PT services    Follow Up Recommendations  Home health PT;Supervision/Assistance - 24 hour    Does the patient have the potential to tolerate intense rehabilitation      Barriers to Discharge        Equipment Recommendations  Rolling walker with 5" wheels    Recommendations for Other Services     Frequency Min 3X/week    Precautions / Restrictions Precautions Precautions: Fall Precaution Comments: R drain,chemo precautions   Pertinent Vitals/Pain       Mobility  Bed Mobility Supine to Sit: 5: Supervision;HOB flat Sitting - Scoot to Edge of Bed: 7: Independent Sit to Supine: 7: Independent Details for Bed Mobility Assistance: No external assist given. Pt was able to get back into bed but with some difficulty and awkwardness noted Transfers Sit to Stand: 4: Min guard;From bed;From toilet;With upper extremity assist Stand to Sit: 5: Supervision;To toilet;To bed;With upper extremity assist Details for Transfer Assistance: increased time.  Pt stops for breaks.  Ambulation/Gait Ambulation/Gait Assistance: 4: Min guard;4: Min Environmental consultant (Feet): 175 Feet Assistive device:  (IV pole and rail) Ambulation/Gait Assistance Details: Pt held onto rail and IV pole. gait much faster today. Gait Pattern: Decreased  step length - left;Decreased step length - right;Decreased stride length Gait velocity: decreased.    Exercises     PT Diagnosis: Generalized weakness;Difficulty walking  PT Problem List: Decreased strength;Decreased activity tolerance;Decreased balance;Decreased mobility;Decreased knowledge of precautions;Decreased knowledge of use of DME PT Treatment Interventions: DME instruction;Gait training;Functional mobility training;Therapeutic activities;Therapeutic exercise;Balance training;Patient/family education     PT Goals(Current goals can be found in the care plan section) Acute Rehab PT Goals Patient Stated Goal: I will walk more. PT Goal Formulation: With patient/family Time For Goal Achievement: 08/31/13 Potential to Achieve Goals: Good  Visit Information  Last PT Received On: 08/17/13 Assistance Needed: +1 History of Present Illness: Admitted c [name] on 08/14/13,  decreased PO intake and CT Ab c/w Interval development of extensive intrahepatic and extrahepatic biliary ductal dilatation        Prior Functioning  Home Living Family/patient expects to be discharged to:: Private residence Living Arrangements: Spouse/significant other Available Help at Discharge: Family Type of Home: House Home Access: Stairs to enter Secretary/administrator of Steps: 5 Entrance Stairs-Rails: Right Home Layout: One level Home Equipment: None Prior Function Level of Independence: Independent Communication Communication: No difficulties    Cognition  Cognition Arousal/Alertness: Awake/alert Behavior During Therapy: WFL for tasks assessed/performed Overall Cognitive Status: Within Functional Limits for tasks assessed Area of Impairment: Orientation;Memory;Safety/judgement;Awareness;Problem solving;Attention Orientation Level: Time Current Attention Level: Alternating Memory: Decreased short-term memory Following Commands: Follows one step commands with increased  time Safety/Judgement: Decreased awareness of safety    Extremity/Trunk Assessment Upper Extremity Assessment Upper Extremity Assessment: Defer to OT evaluation Lower Extremity Assessment Lower Extremity Assessment: Generalized weakness Cervical / Trunk Assessment  Cervical / Trunk Assessment: Normal   Balance Dynamic Standing Balance Dynamic Standing - Balance Support: No upper extremity supported Dynamic Standing - Level of Assistance: 4: Min assist  End of Session PT - End of Session Activity Tolerance: Patient tolerated treatment well;Patient limited by fatigue Patient left: in bed;with call bell/phone within reach;with bed alarm set;with family/visitor present Nurse Communication: Mobility status  GP     Rada Hay 08/17/2013, 1:10 PM  Blanchard Kelch PT [phone]

## 2013-08-17 NOTE — Progress Notes (Signed)
CSW received notification from [facility] that PT recommendation for HHPT/24 hr supervision. RNCM spoke with pt and pt resides at home with spouse and pt plans to return home with spouse and [facility] services upon discharge.  No further social work needs identified at this time.  CSW signing off.   Please re-consult if further social work needs arise.   Jacklynn Lewis, MSW, LCSWA  Clinical Social Work [phone]

## 2013-08-17 NOTE — Progress Notes (Signed)
Subjective: Less pain than admission. Fentanyl patch on. Eating very little. Had XRT yesterday again. Dr Myna Hidalgo saw pt this am and discussed getting Dr Dulce Sellar to see her regarding Celiac Block. Biliary Drain is Open.   Objective: Vital signs in last 24 hours: Temp:  [98.6 F (37 C)-98.8 F (37.1 C)] 98.7 F (37.1 C) (11/13 0631) Pulse Rate:  [84-99] 84 (11/13 0631) Resp:  [17-18] 17 (11/13 0631) BP: (107-188)/(53-76) 181/60 mmHg (11/13 0631) SpO2:  [91 %-97 %] 91 % (11/13 0631) Weight change:  Last BM Date: 08/16/13 (per patient)  CBG (last 3)  No results found for this basename: GLUCAP,  in the last 72 hours  Intake/Output from previous day:  Intake/Output Summary (Last 24 hours) at 08/17/13 0809 Last data filed at 08/17/13 0420  Gross per 24 hour  Intake 1436.67 ml  Output   1470 ml  Net -33.33 ml   11/12 0701 - 11/13 0700 In: 1436.7 [P.O.:720; I.V.:716.7] Out: 1470 [Urine:525; Drains:945]   Physical Exam  General appearance: flat affect and in pain - matter of fact. Eyes: no scleral icterus Throat: oropharynx moist without erythema Resp: CTA Cardio: Reg GI:  Biliary drain is CDI - open and draining. No palpable pain. Extremities: no clubbing, cyanosis or edema    Lab Results:  Recent Labs  08/14/13 1430 08/16/13 0350  NA 132* 134*  K 4.1 3.4*  CL 96 100  CO2 25 23  GLUCOSE 91 99  BUN 7 7  CREATININE 0.76 0.78  CALCIUM 9.7 9.2     Recent Labs  08/15/13 0828 08/16/13 0350  AST 19 21  ALT 14 15  ALKPHOS 216* 223*  BILITOT 0.5 0.6  PROT 6.1 7.0  ALBUMIN 2.1* 2.3*     Recent Labs  08/14/13 1430 08/16/13 0350  WBC 11.2* 13.0*  HGB 9.3* 8.8*  HCT 29.3* 28.1*  MCV 85.2 85.2  PLT 552* 491*    Lab Results  Component Value Date   INR 1.24 07/24/2013   INR 0.94 07/02/2013    No results found for this basename: CKTOTAL, CKMB, CKMBINDEX, TROPONINI,  in the last 72 hours  No results found for this basename: TSH, T4TOTAL, FREET3,  T3FREE, THYROIDAB,  in the last 72 hours  No results found for this basename: VITAMINB12, FOLATE, FERRITIN, TIBC, IRON, RETICCTPCT,  in the last 72 hours  Micro Results: No results found for this or any previous visit (from the past 240 hour(s)).   Studies/Results: No results found.   Medications: Scheduled: . docusate sodium  100 mg Oral BID  . feeding supplement (RESOURCE BREEZE)  1 Container Oral BID BM  . fentaNYL  25 mcg Transdermal Q72H  . heparin  5,000 Units Subcutaneous Q8H  . lactose free nutrition  237 mL Oral BID BM  . lipase/protease/amylase  3 capsule Oral TID WC  . metoCLOPramide  5 mg Oral TID AC  . mirtazapine  30 mg Oral QHS  . multivitamin with minerals  1 tablet Oral Daily  . pantoprazole  40 mg Oral Daily  . potassium chloride  40 mEq Oral BID   Continuous: . sodium chloride 1,000 mL (08/17/13 6045)     Assessment/Plan: Principal Problem:   Pancreatic cancer Active Problems:   Protein-calorie malnutrition, severe   Anemia   Leukocytosis   Intractable abdominal pain  Borderline Resectable Pancreatic CA - Attempt better Pain/nausea control.  CA 19-9 remains very low <1.2. Decrease IVF in anticipation that she will need to be off for an  eventual d/c S/P 11/28 XRT yesterday Chemo on Hold and d/ced per  Dr Myna Hidalgo. Now DNR  Ab pain - Will clamp/Unclamp catheter as needed to allow drainage if felt that Biliary fluid pressure contributing to pain. Appreciate IR Input Duragesic patch for pain control c prn PO Hydrocodone and the MSO4 IV for prn use. After talking c Dr Myna Hidalgo and Dr Mitzi Hansen yesterday our current goals is to get pain controlled, get her more functional, get her eating some more and figure out short term needs ie SNF vrs home.  We will finish out the XRT and see where we end up.  However if she gets worse or declares herself more we will have no other option but Hospice palliative care only.  Anorexia c prot cal malnutrition - I encouraged PO  Intake. Marinol as outpt. Liq cal supplementation. The only reason we would do a PEJ is to buy time for an eventual surgery, but if she will not make it to surgery a PEJ may prolong suffering.  HTN. NTD. Follow  Leukocytosis - Stable and NTD.  Anemia c Hbg 8.8 - follow.  Hypokalemia - Repleted yesterday. Await todays labs Gen Weakness and AFTT - PT/OT/SW.  Nausea - Reglan standing c IV and oral Zofran prn.  Work on function - solidify plan.  We discussed keeping her through tomorrow nights XRT.  I believe GI will see her today aand see if she is amenable to a celiac block. DVT Prophylaxis - SQ hep post procedure.  SCDs.   LOS: 3 days   Giomar Gusler M 08/17/2013, 8:09 AM

## 2013-08-17 NOTE — Consult Note (Signed)
Referring Provider: Dr. Timothy [name] Primary Care Physician:  Carolyn Pounds, MD Primary Gastroenterologist:  Dr. Dulce [name]  Reason for Consultation:  Pancreatic Cancer; Abdominal pain  HPI: Carolyn [name] is a 77 y.o. female with pancreatic cancer who has been on chemo and radiation prior to admit and has an external biliary drain who was admitted on 08/14/13 for worsening abdominal pain, anorexia, and nausea. UGIS on 07/20/13 negative for obstruction. The chemo has since been stopped.  She has been on pain meds with minimal relief. She has upper quadrant pain that varies in intensity and quality. Reports sometimes it is around her drain site and sometimes it is where her radiation is done but other times it is located elsewhere. Unable to sleep due to the pain. Reports no appetite at all. Increased intrahepatic and extrahepatic biliary duct dilation on recent CT. Reports recurrence of severe pruritus like she had prior to the biliary drain being placed. ALP 270 on admit with other LFTs normal. Her good friend and her brother are in the room.       Past Medical History  Diagnosis Date  . Hypertension   . Hyperlipidemia   . GERD (gastroesophageal reflux disease)   . Asthma   . Obstructive jaundice 06/2013  . Pancreatitis   . Arthritis   . Cancer   . Heart murmur   . Thyroid disease   . Pancreatic cancer 07/05/13    pancreatic head=adenocarcinoma    Past Surgical History  Procedure Laterality Date  . Neck surgery  2005/2007  . Hand surgery  2010  . Cataract extraction    . Abdominal hysterectomy    . Eus N/A 07/05/2013    Procedure: ESOPHAGEAL ENDOSCOPIC ULTRASOUND (EUS) RADIAL;  Surgeon: Willis Modena, MD;  Location: [facility];  Service: Endoscopy;  Laterality: N/A;  . Ercp N/A 07/05/2013    Procedure: ENDOSCOPIC RETROGRADE CHOLANGIOPANCREATOGRAPHY (ERCP);  Surgeon: Willis Modena, MD;  Location: [facility];  Service: Endoscopy;  Laterality: N/A;  . Buttock cyst removal    .  Cholecystectomy      Prior to Admission medications   Medication Sig Start Date End Date Taking? Authorizing Provider  acetaminophen (TYLENOL) 325 MG tablet Take 1 tablet (325 mg total) by mouth every 6 (six) hours as needed. 07/28/13  Yes Carolyn Pounds, MD  ALPRAZolam Prudy Feeler) 0.25 MG tablet Take 1-2 pills, IF NEEDED, every 8 hr for anxiety/sleep 08/08/13  Yes Josph Macho, MD  capecitabine (XELODA) 500 MG tablet Take by mouth 2 (two) times daily after a meal.   Yes Historical Provider, MD  cholecalciferol (VITAMIN D) 1000 UNITS tablet Take 1,000 Units by mouth daily.   Yes Historical Provider, MD  docusate sodium 100 MG CAPS Take 100 mg by mouth 2 (two) times daily. 07/07/13  Yes Carolyn Pounds, MD  dronabinol (MARINOL) 2.5 MG capsule Take 1 pill 3 times a day. 07/18/13  Yes Josph Macho, MD  esomeprazole (NEXIUM) 40 MG capsule Take 40 mg by mouth daily before breakfast.   Yes Historical Provider, MD  feeding supplement (ENSURE COMPLETE) LIQD Take 237 mLs by mouth 2 (two) times daily between meals as needed (Please offer to pt if eating <50% of meals). 07/07/13  Yes Carolyn Pounds, MD  HYDROcodone-acetaminophen (NORCO/VICODIN) 5-325 MG per tablet Take 1 tablet by mouth every 6 (six) hours as needed for pain.   Yes Historical Provider, MD  hydrocortisone cream 1 % Apply topically 2 (two) times daily to hemorrhoids. 07/28/13  Yes Margit Banda  Carolyn Lasso, MD  lipase/protease/amylase (CREON-12/PANCREASE) 12000 UNITS CPEP capsule Take 3 capsules by mouth 3 (three) times daily with meals.   Yes Historical Provider, MD  megestrol (MEGACE) 400 MG/10ML suspension Take 10 mLs (400 mg total) by mouth 2 (two) times daily. 07/28/13  Yes Carolyn Pounds, MD  metoCLOPramide (REGLAN) 10 MG tablet Take 1 tablet (10 mg total) by mouth 4 (four) times daily. 07/18/13  Yes Lowella Dell, MD  Multiple Vitamin (MULTIVITAMIN WITH MINERALS) TABS tablet Take 1 tablet by mouth daily. 07/07/13  Yes Carolyn Pounds, MD  ondansetron (ZOFRAN)  8 MG tablet Take 1 tablet (8 mg total) by mouth every 8 (eight) hours as needed for nausea. 07/18/13  Yes Lowella Dell, MD  potassium chloride SA (K-DUR,KLOR-CON) 20 MEQ tablet Take 1 tablet daily. 08/08/13  Yes Josph Macho, MD    Scheduled Meds: . docusate sodium  100 mg Oral BID  . feeding supplement (RESOURCE BREEZE)  1 Container Oral BID BM  . fentaNYL  25 mcg Transdermal Q72H  . heparin  5,000 Units Subcutaneous Q8H  . lactose free nutrition  237 mL Oral BID BM  . lipase/protease/amylase  3 capsule Oral TID WC  . metoCLOPramide  5 mg Oral TID AC  . mirtazapine  30 mg Oral QHS  . multivitamin with minerals  1 tablet Oral Daily  . pantoprazole  40 mg Oral Daily  . potassium chloride  40 mEq Oral BID   Continuous Infusions: . sodium chloride 10 mL/hr (08/17/13 0815)   PRN Meds:.acetaminophen, ALPRAZolam, HYDROcodone-acetaminophen, morphine injection, ondansetron (ZOFRAN) IV, ondansetron  Allergies as of 08/14/2013 - Review Complete 08/14/2013  Allergen Reaction Noted  . Other Other (See Comments) 03/12/2011    Family History  Problem Relation Age of Onset  . Cancer Daughter     Breast    History   Social History  . Marital Status: Married    Spouse Name: N/A    Number of Children: N/A  . Years of Education: N/A   Occupational History  . Not on file.   Social History Main Topics  . Smoking status: Former Smoker -- 1.00 packs/day for 20 years    Types: Cigarettes    Quit date: 06/29/2013  . Smokeless tobacco: Never Used     Comment: PACK A WEEK  . Alcohol Use: 0.6 oz/week    1 Glasses of wine per week     Comment: SOMETIMES  . Drug Use: No     Comment: quit with dx of cancer 2 weeks ago  . Sexual Activity: No   Other Topics Concern  . Not on file   Social History Narrative  . No narrative on file    Review of Systems: All negative except as stated above in HPI.  Physical Exam: Vital signs: Filed Vitals:   08/17/13 0631  BP: 181/60  Pulse:  84  Temp: 98.7 F (37.1 C)  Resp: 17   Last BM Date: 08/16/13 General:   Alert,  Well-developed, well-nourished, pleasant and cooperative in NAD HEENT: anicteric Lungs:  Clear throughout to auscultation.   No wheezes, crackles, or rhonchi. No acute distress. Heart:  Regular rate and rhythm; no murmurs, clicks, rubs,  or gallops. Abdomen: LUQ and epigastric tenderness with guarding, soft, nondistended, +BS, biliary drain intact, nontender at site  Rectal:  Deferred Ext: no edema  GI:  Lab Results:  Recent Labs  08/14/13 1430 08/16/13 0350  WBC 11.2* 13.0*  HGB 9.3* 8.8*  HCT 29.3*  28.1*  PLT 552* 491*   BMET  Recent Labs  08/14/13 1430 08/16/13 0350  NA 132* 134*  K 4.1 3.4*  CL 96 100  CO2 25 23  GLUCOSE 91 99  BUN 7 7  CREATININE 0.76 0.78  CALCIUM 9.7 9.2   LFT  Recent Labs  08/15/13 0828 08/16/13 0350  PROT 6.1 7.0  ALBUMIN 2.1* 2.3*  AST 19 21  ALT 14 15  ALKPHOS 216* 223*  BILITOT 0.5 0.6  BILIDIR 0.2  --   IBILI 0.3  --    PT/INR No results found for this basename: LABPROT, INR,  in the last 72 hours   Studies/Results: No results found.  Impression/Plan: 77 yo pleasant woman with pancreatic cancer that was considered borderline resectable and has been on chemo/radiation, who was admitted for worsening abdominal pain and failure-to-thrive. Role of celiac plexus block questioned by Oncology and I have recommended that for her since her pain has been resistant to pain meds and is likely contributing to her inability/lack of desire to eat. No gastric or duodenal obstruction seen on UGIS last month. Will discuss with Dr. Dulce [name] to see if celiac plexus block can be done next week. Continue supportive care in meantime. Follow LFTs.      LOS: 3 days   Carolyn [name] C.  08/17/2013, 1:00 PM

## 2013-08-18 ENCOUNTER — Ambulatory Visit
Admission: RE | Admit: 2013-08-18 | Discharge: 2013-08-18 | Disposition: A | Discharge status: Home / Self Care | Payer: Medicare Other | Source: Ambulatory Visit | Attending: Radiation Oncology | Admitting: Radiation Oncology

## 2013-08-18 DIAGNOSIS — C250 Malignant neoplasm of head of pancreas: Secondary | ICD-10-CM

## 2013-08-18 DIAGNOSIS — E46 Unspecified protein-calorie malnutrition: Secondary | ICD-10-CM

## 2013-08-18 LAB — COMPREHENSIVE METABOLIC PANEL
ALT: 14 U/L (ref 0–35)
AST: 24 U/L (ref 0–37)
Albumin: 2.2 g/dL — ABNORMAL LOW (ref 3.5–5.2)
Alkaline Phosphatase: 212 U/L — ABNORMAL HIGH (ref 39–117)
Calcium: 9.2 mg/dL (ref 8.4–10.5)
Chloride: 105 mEq/L (ref 96–112)
Creatinine, Ser: 0.79 mg/dL (ref 0.50–1.10)
GFR calc non Af Amer: 79 mL/min — ABNORMAL LOW (ref >90)
Sodium: 138 mEq/L (ref 135–145)
Total Bilirubin: 0.7 mg/dL (ref 0.3–1.2)
Total Protein: 6.6 g/dL (ref 6.0–8.3)

## 2013-08-18 LAB — CBC
HCT: 29 % — ABNORMAL LOW (ref 36.0–46.0)
Hemoglobin: 9.2 g/dL — ABNORMAL LOW (ref 12.0–15.0)
MCH: 27.1 pg (ref 26.0–34.0)
MCHC: 31.7 g/dL (ref 30.0–36.0)
MCV: 85.3 fL (ref 78.0–100.0)
RBC: 3.4 MIL/uL — ABNORMAL LOW (ref 3.87–5.11)
RDW: 18 % — ABNORMAL HIGH (ref 11.5–15.5)

## 2013-08-18 MED ORDER — POLYETHYLENE GLYCOL 3350 17 G PO PACK
17.0000 g | PACK | Freq: Every day | ORAL | Status: DC
  Administered 2013-08-19 – 2013-08-21 (×3): 17 g via ORAL
  Filled 2013-08-18 (×4): qty 1

## 2013-08-18 MED ORDER — FENTANYL 50 MCG/HR TD PT72
50.0000 ug | MEDICATED_PATCH | TRANSDERMAL | Status: DC
  Administered 2013-08-19: 50 ug via TRANSDERMAL
  Filled 2013-08-18: qty 1

## 2013-08-18 NOTE — Progress Notes (Signed)
[facility]  Patient Status: Active (receiving services up to time of hospitalization)  [facility] is providing the following services: RN and HHA  If patient discharges after hours, please call [phone].   Lanae Crumbly 08/18/2013, 1:32 PM

## 2013-08-18 NOTE — Progress Notes (Signed)
Patient [name]      DOB: 02-06-1936      AVW:[id number]  Carolyn [name] was kind enough to allow me to adjust our time to meet to 11 am tomorrow.   Aston Lawhorn L. Ladona Ridgel, MD MBA The Palliative Medicine Team at [facility] Phone: [phone] Pager: [phone]

## 2013-08-18 NOTE — Progress Notes (Signed)
OT Cancellation Note  Patient Details Name: Carolyn [name] MRN: [id number] DOB: 10/14/1935   Cancelled Treatment:    Reason Eval/Treat Not Completed: Fatigue/lethargy limiting ability to participate Will recheck at schedule allows  Alba Cory 08/18/2013, 10:18 AM

## 2013-08-18 NOTE — Progress Notes (Signed)
Biliary drain bag leaking, changed by IR , drain left unclamped, draining brownish fluid. Patient c/o abdominal pain.Medicated.

## 2013-08-18 NOTE — Consult Note (Signed)
NAMEMarland Kitchen  Carolyn [name], Carolyn [name] NO.:  [id number]  MEDICAL RECORD NO.:  [id number]  LOCATION:  1312                         FACILITY:  [facility]  PHYSICIAN:  Josph Macho, M.D.  DATE OF BIRTH:  August 25, 1936  DATE OF CONSULTATION:  08/15/2013 DATE OF DISCHARGE:                                CONSULTATION   REFERRING PHYSICIAN:  Gwen Pounds, MD.  REASON FOR CONSULTATION: 1. Locally advanced pancreatic cancer. 2. Failure to thrive.  HISTORY OF PRESENT ILLNESS:  Carolyn [name] is a nice 77 year old African American female.  She initially presented back in early September with borderline pancreatic cancer.  We have tried her on radiation chemotherapy.  The patient began very low-dose Xeloda with radiation.  I think she has only had maybe about 1 full week of therapy.  She had previously been hospitalized with renal failure and marked dehydration.  We have seen her in the office last week.  We made some adjustments to her medications.  She just has a lot of anxiety.  She has difficulty trying to eating drink.  We have her on pancreatic replacement enzymes. We gave her IV fluids in the office.  She again comes back into the hospital.  She was admitted on the 10th. Again, there was some element of I guess dehydration.  When she came in, her white cell count 11.2, hemoglobin 9.3, hematocrit 29.3, platelet count 552.  Her sodium was 132, potassium 4.1, BUN 7, creatinine 0.76.  LFTs were normal.  Alkaline phosphatase of 270.  She did have a CT of the abdomen and pelvis when she came in.  She had intrahepatic and extrahepatic ductal dilatation.  The pancreatic head mass was not visualized because of lack of contrast material.  There was no obvious progression of disease.  We will start her on some IV fluids.  She has lot of anxiety.  She just having a hard time trying to get through treatments.  It is certainly possible that the Xeloda could be causing some issues.  PAST  MEDICAL HISTORY:  Remarkable for: 1. Hypertension. 2. Hyperlipidemia. 3. Multinodular goiter.  ALLERGIES:  "OTHER."  MEDICATIONS: 1. Drisdol 1000 mg p.o. b.i.d. 2. Xanax 0.25 mg daily. 3. Vitamin D 1000 units daily. 4. Colace 1 p.o. b.i.d. 5. Protonix 40 mg p.o. daily. 6. Vicodin (5/325) 1 p.o. q.6 hours p.r.n. 7. Creon-12 1 p.o. with meals. 8. Megace 400 mg 10 mL p.o. b.i.d. 9. Reglan 10 mg p.o. q.i.d. 10.Multivitamin daily. 11.Zofran 8 mg q.8 hours p.r.n. 12.Potassium 20 mg p.o. daily.  SOCIAL HISTORY:  Noncontributory.  REVIEW OF SYSTEMS:  As stated in the history of present illness.  PHYSICAL EXAMINATION:  GENERAL:  This is an elderly-appearing African American female in no obvious distress. VITAL SIGNS:  Showed temperature of 98.9, pulse 87, respiratory rate 16, blood pressure 157/59. HEAD AND NECK:  No ocular or oral lesions.  There is no scleral icterus. She has dry oral mucosa.  There is no thrush.  There is no mucositis. There is no adenopathy in her neck. LUNGS:  With decreased breath sounds at the bases. CARDIAC:  Regular rate and rhythm with normal S1 and S2.  There are no murmurs, rubs or bruits. ABDOMEN:  Soft.  She has slight decreased bowel sounds.  There is no guarding or rebound tenderness.  There is no fluid wave.  There is no obvious hepatosplenomegaly. BACK:  No tenderness over the spine. EXTREMITIES:  Shows some trace edema in her lower legs.  She has no venous cord.  There is some slight decreased strength bilaterally. NEUROLOGICAL:  Shows no focal neurological deficits.  IMPRESSION:  Carolyn [name] is a 77 year old African American female with borderline resectable pancreatic cancer.  She came into the hospital again for failure to thrive.  She has normal bilirubin despite the intra and extrahepatic biliary dilatation.  I am sure that Radiology will try to help with this.  I would stop the Xeloda.  It is probable that the Xeloda by  selvage causing issues.  Of note, she had urinalysis done, this was negative.  I do not think she has biliary sepsis.  She is not febrile.  There is no abdominal symptoms, so I do not think she has biliary tract infection from the tube.  Again, I am going to stop the Xeloda.  Maybe she can just go with radiation therapy.  I sincerely doubt that she will ever make it to surgery.  I just do not see that she is going to have back of a performance status to make it to surgery if she gets through on treatments.  I do not know if anxiety is playing a lot of influence with her situation.  For now, I talked to her.  I asked her if she want to keep going with treatments.  She said that she will try, but she has continued problems, then she will stop.  I told her that I just did not think she was going to ever have surgery. She understands this.  I told her that without surgery, she will not be cured.  She also understands this.  She just wants to feel better and try to have a better quality of life.  Again, we will follow along closely.     Josph Macho, M.D.     PRE/MEDQ  D:  08/15/2013  T:  08/16/2013  Job:  [id number]  cc:   Gwen Pounds, MD Fax: [phone]

## 2013-08-18 NOTE — Progress Notes (Signed)
Patient seen in the back by MD not sent to nursing for assessment 3:01 PM

## 2013-08-18 NOTE — Progress Notes (Signed)
Patient NW:GNFAO Carolyn [name]      DOB: 11/30/1935      ZHY:[id number]   Met briefly with the Hendler family.  Carolyn [name] was being taken down for Radiation and meeting with Dr. Dayton Scrape.  Spoke with Carolyn [name].  Plan to touch base with them tomorrow between 9-10 at his request.  Their daughter should arrive this evening.  Patient appeared upbeat and comfortable at this time.   Carolyn [name] L. Ladona Ridgel, MD MBA The Palliative Medicine Team at [facility] Phone: [phone] Pager: [phone]

## 2013-08-18 NOTE — Progress Notes (Signed)
Mrs. Overbay looks about the same. She may be feeling a little better.  She really is under nourished. Her prealbumin is only 6.1. I then this is very concerning. I think this is highly indicative of her not being able to have anything more aggressive for her pancreatic cancer. I think radiation by itself is all that she is going to tolerate.  Her CA 19 9 is still very low.  She started some physical therapy. She will need this consistently.  It is obvious that she just is not eating much. The dietitian is working on trying to help this.  We now have her on Remeron which hopefully will help with depression and also improve her appetite.  Her labs don't look too bad. Her kidney function look stable.  Her vital signs are all stable. Blood pressure 162/65.  I can't find anything focal on her physical exam.  I believe that the goal at this point is to try to get her through radiation therapy. After that, I think we were looking at her quality of life and comfort issues. Again, I just do not see that she will ever ever  be a candidate for surgery.  Her prealbumin really needs to be a lot higher I think that she is to have a meaningful prognosis.  I appreciate the note from gastroenterology. It looks like they may want to try a celiac block.  I do appreciate all the great care that she is received on 3E!!  Pete E.  Hebrews 12:12

## 2013-08-18 NOTE — Progress Notes (Signed)
Department of Radiation Oncology  Phone:  [phone] Fax:        [phone]  INPATIENT   Weekly Treatment Note    Name: Carolyn [name] Date: 08/18/2013 MRN: [id number] DOB: [birth year]    Current fraction: 13   MEDICATIONS: No current facility-administered medications for this encounter.   No current outpatient prescriptions on file.   Facility-Administered Medications Ordered in Other Encounters  Medication Dose Route Frequency Provider Last Rate Last Dose  . 0.9 %  sodium chloride infusion   Intravenous Continuous Gwen Pounds, MD 10 mL/hr at 08/17/13 0815 10 mL/hr at 08/17/13 0815  . acetaminophen (TYLENOL) tablet 325 mg  325 mg Oral Q6H PRN Gwen Pounds, MD      . ALPRAZolam Prudy Feeler) tablet 0.25 mg  0.25 mg Oral BID PRN Gwen Pounds, MD   0.25 mg at 08/16/13 2111  . docusate sodium (COLACE) capsule 100 mg  100 mg Oral BID Gwen Pounds, MD   100 mg at 08/18/13 1007  . feeding supplement (RESOURCE BREEZE) (RESOURCE BREEZE) liquid 1 Container  1 Container Oral BID BM Lavena Bullion, RD   1 Container at 08/18/13 1008  . [START ON 08/19/2013] fentaNYL (DURAGESIC - dosed mcg/hr) 50 mcg  50 mcg Transdermal Q72H Kari Baars, MD      . heparin injection 5,000 Units  5,000 Units Subcutaneous Q8H Gwen Pounds, MD   5,000 Units at 08/18/13 [phone]  . HYDROcodone-acetaminophen (NORCO/VICODIN) 5-325 MG per tablet 1 tablet  1 tablet Oral Q6H PRN Gwen Pounds, MD   1 tablet at 08/16/13 2111  . lactose free nutrition (BOOST PLUS) liquid 237 mL  237 mL Oral BID BM Lavena Bullion, RD      . lipase/protease/amylase (CREON-12/PANCREASE) capsule 3 capsule  3 capsule Oral TID WC Gwen Pounds, MD   3 capsule at 08/18/13 1304  . metoCLOPramide (REGLAN) tablet 5 mg  5 mg Oral TID AC Gwen Pounds, MD   5 mg at 08/18/13 1304  . mirtazapine (REMERON) tablet 30 mg  30 mg Oral QHS Josph Macho, MD   30 mg at 08/17/13 2100  . morphine 4 MG/ML injection 4 mg  4 mg Intravenous Q3H PRN Gwen Pounds, MD   4 mg at 08/18/13 1247  . multivitamin with minerals tablet 1 tablet  1 tablet Oral Daily Gwen Pounds, MD   1 tablet at 08/18/13 1007  . ondansetron (ZOFRAN) injection 4 mg  4 mg Intravenous Q6H PRN Gwen Pounds, MD   4 mg at 08/18/13 1301  . ondansetron (ZOFRAN) tablet 4 mg  4 mg Oral Q8H PRN Gwen Pounds, MD      . pantoprazole (PROTONIX) EC tablet 40 mg  40 mg Oral Daily Gwen Pounds, MD   40 mg at 08/18/13 1007  . polyethylene glycol (MIRALAX / GLYCOLAX) packet 17 g  17 g Oral Daily W Buren Kos, MD      . potassium chloride SA (K-DUR,KLOR-CON) CR tablet 40 mEq  40 mEq Oral BID Gwen Pounds, MD   40 mEq at 08/18/13 1007     ALLERGIES: Other   LABORATORY DATA:  Lab Results  Component Value Date   WBC 10.3 08/18/2013   HGB 9.2* 08/18/2013   HCT 29.0* 08/18/2013   MCV 85.3 08/18/2013   PLT 551* 08/18/2013   Lab Results  Component Value Date   NA 138 08/18/2013   K 3.9 08/18/2013  CL 105 08/18/2013   CO2 26 08/18/2013   Lab Results  Component Value Date   ALT 14 08/18/2013   AST 24 08/18/2013   ALKPHOS 212* 08/18/2013   BILITOT 0.7 08/18/2013     NARRATIVE: Carolyn [name] was seen today for weekly treatment management. The chart was checked and the patient's films were reviewed. The patient remains an inpatient. He states that she has done okay with treatment. Feeling better since she stop the chemotherapy he'll. She has had some difficulties with her drain.   PHYSICAL EXAMINATION:   Lying in a hospital bed. Alert and oriented.    ASSESSMENT: The patient is doing satisfactorily with treatment.  PLAN: We will continue with the patient's radiation treatment as planned. I have discussed her care with the inpatient team. We will continue to monitor her status and we can reevaluate her treatment if she seems to have substantial difficulty proceeding with radiation. She does wish to proceed with treatment at this time.

## 2013-08-18 NOTE — Progress Notes (Signed)
[facility] Radiation Oncology Dept Therapy Treatment Record Phone [phone]   Radiation Therapy was administered to Carolyn [name] on: 08/18/2013  1:59 PM and was treatment # 13 out of a planned course of 28 treatments.

## 2013-08-18 NOTE — Progress Notes (Signed)
Subjective: Pain has increased today.  Had a good day yesterday- ambulated in hall and multiple visitors.  Ate OK yesterday but appetite remains poor.  Does not feel that she can go home in current state.    Objective: Vital signs in last 24 hours: Temp:  [98.4 F (36.9 C)-99.4 F (37.4 C)] 99.2 F (37.3 C) (11/14 0500) Pulse Rate:  [82-93] 88 (11/14 0500) Resp:  [18-20] 20 (11/14 0500) BP: (162-182)/(65-70) 162/65 mmHg (11/14 0500) SpO2:  [95 %-98 %] 98 % (11/14 0500) Weight change:  Last BM Date: 08/16/13  CBG (last 3)  No results found for this basename: GLUCAP,  in the last 72 hours  Intake/Output from previous day: 11/13 0701 - 11/14 0700 In: 422.2 [P.O.:240; I.V.:182.2] Out: 1900 [Urine:1700; Drains:200] Intake/Output this shift:    General appearance: withdrawn, quiet, no acute distress Eyes: no scleral icterus Throat: oropharynx moist without erythema Resp: clear to auscultation bilaterally Cardio: regular rate and rhythm GI: soft, non-tender; hypoactive bowel sounds; no masses,  no organomegaly; drain in place with minimal bilious drainage (currently clamped/kinked) Extremities: no clubbing, cyanosis or edema   Lab Results:  Recent Labs  08/16/13 0350 08/18/13 0424  NA 134* 138  K 3.4* 3.9  CL 100 105  CO2 23 26  GLUCOSE 99 86  BUN 7 7  CREATININE 0.78 0.79  CALCIUM 9.2 9.2    Recent Labs  08/16/13 0350 08/18/13 0424  AST 21 24  ALT 15 14  ALKPHOS 223* 212*  BILITOT 0.6 0.7  PROT 7.0 6.6  ALBUMIN 2.3* 2.2*    Recent Labs  08/16/13 0350 08/18/13 0424  WBC 13.0* 10.3  HGB 8.8* 9.2*  HCT 28.1* 29.0*  MCV 85.2 85.3  PLT 491* 551*   Lab Results  Component Value Date   INR 1.24 07/24/2013   INR 0.94 07/02/2013    Studies/Results: No results found.   Medications: Scheduled: . docusate sodium  100 mg Oral BID  . feeding supplement (RESOURCE BREEZE)  1 Container Oral BID BM  . fentaNYL  25 mcg Transdermal Q72H  . heparin  5,000  Units Subcutaneous Q8H  . lactose free nutrition  237 mL Oral BID BM  . lipase/protease/amylase  3 capsule Oral TID WC  . metoCLOPramide  5 mg Oral TID AC  . mirtazapine  30 mg Oral QHS  . multivitamin with minerals  1 tablet Oral Daily  . pantoprazole  40 mg Oral Daily  . potassium chloride  40 mEq Oral BID   Continuous: . sodium chloride 10 mL/hr (08/17/13 0815)    Assessment/Plan: Principal Problem: 1. Intractable abdominal pain secondary to Pancreatic cancer/biliary obstruction- waxing and waning pain.  Continue percutaneous drainage- May need to unclamp if pain is getting worse.  Increase Fentanyl patch to q72 hrs.  Continue MSO4 as needed.  Transition to oral pain meds for breakthrough to enable dishcarge.  Daughter will be arriving today.  Plan for family meeting with palliative care (per Dr. Timothy Lasso) to assist with goals of therapy and symptom management.  GI considering Celiac block next week.  Continue XRT (treatment today).  No longer receiving chemotherapy.  Active Problems: 2. Protein-calorie malnutrition, severe- encouraged po intake.  Continue supplements. 3. Anemia- stable 4. Constipation- add Miralax.  Continue Reglan and Colace. 5. Ethics- DNR- PCM consult to address goals of therapy 6. Dispoistion- continue PT/OT.  Anticipate discharge home once pain is manageable with oral medications (not currently).  Possible discharge this weekend pending Palliative Care discussion to  continue outpatient therapy.   LOS: 4 days   Carolyn [name],W DOUGLAS 08/18/2013, 7:33 AM

## 2013-08-18 NOTE — Progress Notes (Signed)
PT Cancellation Note  ___Treatment cancelled today due to medical issues with patient which prohibited therapy  ___ Treatment cancelled today due to patient receiving procedure or test   ___ Treatment cancelled today due to patient's refusal to participate   _X_ Treatment cancelled today due to pt out of room this afternoon at Radiation  Felecia Shelling  PTA [facility]  Acute  Rehab Pager      [phone]

## 2013-08-18 NOTE — Progress Notes (Signed)
Patient ID: Carolyn [name], female   DOB: 28-Nov-1935, 77 y.o.   MRN: [id number] [name] Gastroenterology Progress Note  [name] 77 y.o. 1936-09-29   Subjective: Pain worse today. Husband at bedside.  Objective: Vital signs in last 24 hours: Filed Vitals:   08/18/13 0500  BP: 162/65  Pulse: 88  Temp: 99.2 F (37.3 C)  Resp: 20    Physical Exam: Gen: alert, no acute distress Abd: diffuse tenderness (guarding in upper quadrants), soft, nondistended, drain intact, +BS  Lab Results:  Recent Labs  08/16/13 0350 08/18/13 0424  NA 134* 138  K 3.4* 3.9  CL 100 105  CO2 23 26  GLUCOSE 99 86  BUN 7 7  CREATININE 0.78 0.79  CALCIUM 9.2 9.2    Recent Labs  08/16/13 0350 08/18/13 0424  AST 21 24  ALT 15 14  ALKPHOS 223* 212*  BILITOT 0.6 0.7  PROT 7.0 6.6  ALBUMIN 2.3* 2.2*    Recent Labs  08/16/13 0350 08/18/13 0424  WBC 13.0* 10.3  HGB 8.8* 9.2*  HCT 28.1* 29.0*  MCV 85.2 85.3  PLT 491* 551*   No results found for this basename: LABPROT, INR,  in the last 72 hours    Assessment/Plan: Pancreatic cancer with worsening abdominal pain and recurrent N/V. Celiac plexus block planned for next Wednesday to see if that will help with her pain. I agree that she needs to stay in the hospital longer because she is taking in minimal POs and is at increased risk for dehydration if she goes home. Will follow. She has not completely decided that she wants the celiac block and wants to ponder it some more today talking with her husband. Will tentatively schedule for celiac plexus block for next week unless she decides that she does not want it. Agree with palliative care discussion.   Carolyn [name] C. 08/18/2013, 9:32 AM

## 2013-08-19 DIAGNOSIS — R630 Anorexia: Secondary | ICD-10-CM

## 2013-08-19 DIAGNOSIS — F329 Major depressive disorder, single episode, unspecified: Secondary | ICD-10-CM

## 2013-08-19 DIAGNOSIS — Z515 Encounter for palliative care: Secondary | ICD-10-CM

## 2013-08-19 DIAGNOSIS — R109 Unspecified abdominal pain: Secondary | ICD-10-CM

## 2013-08-19 DIAGNOSIS — E43 Unspecified severe protein-calorie malnutrition: Secondary | ICD-10-CM

## 2013-08-19 LAB — CBC
HCT: 30 % — ABNORMAL LOW (ref 36.0–46.0)
Hemoglobin: 9.5 g/dL — ABNORMAL LOW (ref 12.0–15.0)
MCV: 85.7 fL (ref 78.0–100.0)
RBC: 3.5 MIL/uL — ABNORMAL LOW (ref 3.87–5.11)
RDW: 17.8 % — ABNORMAL HIGH (ref 11.5–15.5)
WBC: 10.2 10*3/uL (ref 4.0–10.5)

## 2013-08-19 LAB — COMPREHENSIVE METABOLIC PANEL
ALT: 17 U/L (ref 0–35)
BUN: 9 mg/dL (ref 6–23)
Calcium: 9.1 mg/dL (ref 8.4–10.5)
Creatinine, Ser: 0.85 mg/dL (ref 0.50–1.10)
GFR calc Af Amer: 76 mL/min — ABNORMAL LOW (ref >90)
Glucose, Bld: 87 mg/dL (ref 70–99)
Total Protein: 6.7 g/dL (ref 6.0–8.3)

## 2013-08-19 NOTE — Progress Notes (Signed)
Patient ID: Carolyn [name], female   DOB: 10/10/1935, 77 y.o.   MRN: [id number] [facility] Gastroenterology Progress Note  Carolyn [name] 77 y.o. August 19, 1936   Subjective: More cheerful today. Reports better control of pain since Fentanyl dose increased. Daughter, son-in-law, and grandson at bedside.  Objective: Vital signs in last 24 hours: Filed Vitals:   08/19/13 1347  BP: 144/48  Pulse: 90  Temp: 99.1 F (37.3 C)  Resp: 18    Physical Exam: Gen: alert, no acute distress Abd: periumbilical tenderness with guarding, drain in place, soft, nondistended, +BS  Lab Results:  Recent Labs  08/18/13 0424 08/19/13 0346  NA 138 137  K 3.9 3.8  CL 105 104  CO2 26 25  GLUCOSE 86 87  BUN 7 9  CREATININE 0.79 0.85  CALCIUM 9.2 9.1    Recent Labs  08/18/13 0424 08/19/13 0346  AST 24 27  ALT 14 17  ALKPHOS 212* 211*  BILITOT 0.7 0.6  PROT 6.6 6.7  ALBUMIN 2.2* 2.2*    Recent Labs  08/18/13 0424 08/19/13 0346  WBC 10.3 10.2  HGB 9.2* 9.5*  HCT 29.0* 30.0*  MCV 85.3 85.7  PLT 551* 501*   No results found for this basename: LABPROT, INR,  in the last 72 hours    Assessment/Plan: 77 yo with pancreatic cancer and failure to thrive. Pain better controlled today. Continue supportive care. IVFs. Celiac plexus block planned for Wednesday and can be done as outpt if she is discharged prior to that day. She is agreeable to the plexus block that we previously discussed.   Katherene Dinino C. 08/19/2013, 5:27 PM

## 2013-08-19 NOTE — Progress Notes (Signed)
Patient is resting comfortably and according to the daughter at the bedside who is from [place] she is much more comfortable on the Duragesic patch. Oral intake remains marginal. She is no longer receiving chemotherapy. Celiac block is being considered next week. Daughter is meeting with the palliative care team this morning to discuss course and disposition. I did have an extended discussion with daughter regarding extent of the pancreatic cancer with obstruction and the focus on palliative care and disposition. It is clear according to the daughter that husband is no longer able to care for her. We discussed the fact that her nutritional status will continue to decline and that IV therapies not an answer for this long-term. We additionally discussed the focus on analgesic choice and comfort and finding the appropriate disposition.. Care meeting is later this morning and we will continue to adjust her medication

## 2013-08-19 NOTE — Progress Notes (Signed)
Ms. Keilman is really about the same. There is some issues with the biliary drainage catheter by radiology help with this. She still not eating much. There is still  an element of depression. We did start her on Remeron.  It looks like she may have a celiac block next week?? I appreciate gastroenterology help.  There'll be a palliative care meeting today. I think this would be helpful. I the once he completes radiation, then we need to focus on her quality of life.  Her most recent prealbumin is only 6.1. This I think, is incredibly worrisome for a ominous prognosis.  Unless she eats better, I just think that. Her prognosis will be limited even if we don't see obvious metastatic disease.  I think that the element of depression is a a big factor. We need to make sure that we have her on an aggressive antidepressant program. Maybe palliative care can help [place] with this.  The pain that she has comes and goes. She is on a Duragesic patch.  Would not doing physical therapy. They come by to see her sometime she's not there. Hopefully, they can see her over the weekend.  Her labs don't look too bad. Bilirubin 0.6. Calcium 9.1. Albumin is only 2.2. Hemoglobin 9.5 with a platelet count 501.  Her physical exam is still pretty much unchanged. Blood pressure 156/60.  We will continue to follow along and try to make adjustments as needed.  Pete E.  Hebrews 12:12

## 2013-08-19 NOTE — Progress Notes (Signed)
Clinical Social Work Department BRIEF PSYCHOSOCIAL ASSESSMENT 08/19/2013  Patient:  Carolyn [name], Carolyn [name]     Account Number:  [id number]     Admit date:  08/14/2013  Clinical Social Worker:  Doroteo Glassman  Date/Time:  08/19/2013 03:41 PM  Referred by:  Physician  Date Referred:  08/19/2013 Referred for  SNF Placement   Other Referral:   Interview type:  Patient Other interview type:   Several family members present    PSYCHOSOCIAL DATA Living Status:  HUSBAND Admitted from facility:   Level of care:   Primary support name:  Carolyn [name] Primary support relationship to patient:  SPOUSE Degree of support available:   strong    CURRENT CONCERNS Current Concerns  Post-Acute Placement   Other Concerns:    SOCIAL WORK ASSESSMENT / PLAN Met with Pt and family to discuss d/c plans.    SNF process was discussed and questions were answered.  CSW provided Pt's daughter with SNF list.    Pt was agitated and stated that she didn't want to make any decision re: her d/c until her husband was consulted.    Pt's daughter to contact Pt's husband and let CSW know the decision.   Assessment/plan status:  Psychosocial Support/Ongoing Assessment of Needs Other assessment/ plan:   Information/referral to community resources:   SNF list    PATIENT'S/FAMILY'S RESPONSE TO PLAN OF CARE: Pt's response to her plan of care was frustration.  She wasn't willing to make any decision until her husband was consulted.    Pt's family handling the situation well and Pt had a strong support system around her.   Carolyn [name], LCSWA Clinical Social Work [phone]

## 2013-08-19 NOTE — Consult Note (Signed)
Patient Carolyn [name]      DOB: 12/13/1935      HYQ:[id number]     Consult Note from the Palliative Medicine Team at [facility]    Consult Requested by: Dr. Timothy Lasso     PCP: Gwen Pounds, MD Reason for Consultation: Goals of care    Phone Number:[phone]  related symptom recommendations  Assessment of patients Current state: 77 year old [place] American female with pancreatic cancer. Patient relates her goals as following: "She states that I have to leave these people someday and there not going to like it but I don't feel that its right now." She related that she is hopeful to shrink the tumor with radiation and give Dr. Donell Beers the chance to resect the tumor as was previously planned even though she has heard from several practitioners that the surgery is likely not an option. She remains holding onto the opinion from Dr. Donell Beers last that she might be able to have surgical intervention. Patient was asked about her eating by her daughter who reiterated the conversation Dr. Timothy Lasso had with her regarding feeding tubes and she stated she did not remember stating she would not want to have a feeding tube to get stronger with. Patient's husband confirmed that the conversation had occurred in that she may have said now in a time when she was uncomfortable and not thinking clearly. At this time the family and patient are unified in the following goals he would like to continue radiation treatments through December with the hope of shaking the tumor small enough to undergo surgical resection. The patient states she would consider a feeding tube if it would allow her to reach this goal and would like to speak to Dr. Timothy Lasso more about on Monday. The family understands that the patient can likely not stay in the hospital for her radiation treatments, like to consider a skilled nursing facility. They asked the following questions which have been conveyed to social work: He would like a location that is near her  home in [place]. They wanted to know if the patient could have a pass to go home during the holidays from the facility. They wanted to know if the patient to have IV fluids to support the output from her drain. He wanted to know if the facility would transport her to radiation and back. At this time we will need to further query the patient's surgeon and primary physician on Monday to collaborate along these  lines. If surgery is not an option now or in the future we will need to help the patient work through this process which she is willing to do and she states she knows that sometime she will have to make these hard choices. She did allow me to speak slightly about hospice care but clearly at this time that is not their goal. We talked about advanced care planning and she is willing to take a look at an advanced directive packet to complete with her family.  Goals of Care: 1.  Code Status: DO NOT RESUSCITATE    2. Scope of Treatment: Patient desires to continue the full scope of treatment at this time including antibiotic therapy if necessary, radiation treatments, possible celiac block if this is still planned for next week, and she would consider PEG tube site feedings if it would support her towards getting a surgical intervention    4. Disposition: Skilled nursing placement for rehabilitation with intent to discharge to home as soon as possible  3. Symptom Management:   1. Pain: Patient states that her fentanyl patch appears to be providing better pain control. She's not pleased with the mattress on her bed and we will attempt to see if an air mattress overlay we'll give her more comfort. Would continue with current regimen using Vicodin for breakthrough and sentinel for baseline pain control. 2. Failure to thrive anorexia: Agree with Remeron in collaboration with Reglan. 3. Depression: Patient appears to be doing fairly well with stimulation from her family, addition of  Remeron. Celexa could be added however my concern is the patient having difficulty taking most of her oral medications now. If the Remeron works been single therapy is all that is necessary. Family and physicians should continue to encourage the patient towards independence which will help to improve her depression.  4.  currently there is no reported restriction to activities physical therapy should make attempts to continue to work with her 5.  Anxiety continue Xanax as needed per orders 6. Surgical opinion: Would recommend possibly having Dr. Donell Beers speak with the patient on Monday regarding her updated recommendations if the patient can plan accordingly. Will discuss with Dr. Timothy Lasso on Monday. Discussed with Dr. Myna Hidalgo today who agrees this information should be provided directly from surgery.  4. Psychosocial: patient is supported by her family surrounding her which encourages her in a blister. She is married, her daughter has arrived from [place] with their grandson   5. Spiritual: patient appreciates up lifting support from her [place] physicians and friends. Reminded her that chaplain's are available any time if one of the other are not available.         Patient Documents Completed or Given: Document Given Completed  Advanced Directives Pkt    MOST    DNR    Gone from My Sight    Hard Choices      Brief HPI: 77 year old African American female with a diagnosis of pancreatic cancer. The initial hope had been that we would be able to use chemotherapy and radiation to shrink the tumor so that Dr. Donell Beers could perform a Whipple procedure. Patient was admitted to the hospital with poor oral intake and dehydration. We are asked to assist with goals of care she continues to have difficulty with likely depression and decreased oral intake making her a poor candidate for further chemotherapy or surgical intervention.   ROS: Hungry for a hamburger, pain is manageable at this time no nausea  other than when taking medications    PMH:  Past Medical History  Diagnosis Date  . Hypertension   . Hyperlipidemia   . GERD (gastroesophageal reflux disease)   . Asthma   . Obstructive jaundice 06/2013  . Pancreatitis   . Arthritis   . Cancer   . Heart murmur   . Thyroid disease   . Pancreatic cancer 07/05/13    pancreatic head=adenocarcinoma     PSH: Past Surgical History  Procedure Laterality Date  . Neck surgery  2005/2007  . Hand surgery  2010  . Cataract extraction    . Abdominal hysterectomy    . Eus N/A 07/05/2013    Procedure: ESOPHAGEAL ENDOSCOPIC ULTRASOUND (EUS) RADIAL;  Surgeon: Willis Modena, MD;  Location: [facility];  Service: Endoscopy;  Laterality: N/A;  . Ercp N/A 07/05/2013    Procedure: ENDOSCOPIC RETROGRADE CHOLANGIOPANCREATOGRAPHY (ERCP);  Surgeon: Willis Modena, MD;  Location: [facility];  Service: Endoscopy;  Laterality: N/A;  . Buttock cyst removal    . Cholecystectomy  I have reviewed the FH and SH and  If appropriate update it with new information. Allergies  Allergen Reactions  . Other Other (See Comments)    Alka seltzer caused severe swelling   Scheduled Meds: . docusate sodium  100 mg Oral BID  . feeding supplement (RESOURCE BREEZE)  1 Container Oral BID BM  . fentaNYL  50 mcg Transdermal Q72H  . heparin  5,000 Units Subcutaneous Q8H  . lactose free nutrition  237 mL Oral BID BM  . lipase/protease/amylase  3 capsule Oral TID WC  . metoCLOPramide  5 mg Oral TID AC  . mirtazapine  30 mg Oral QHS  . multivitamin with minerals  1 tablet Oral Daily  . pantoprazole  40 mg Oral Daily  . polyethylene glycol  17 g Oral Daily  . potassium chloride  40 mEq Oral BID   Continuous Infusions: . sodium chloride 10 mL/hr (08/17/13 0815)   PRN Meds:.acetaminophen, ALPRAZolam, HYDROcodone-acetaminophen, morphine injection, ondansetron (ZOFRAN) IV, ondansetron    BP 144/48  Pulse 90  Temp(Src) 99.1 F (37.3 C) (Oral)  Resp 18  Ht 5'  3" (1.6 m)  Wt 90.5 kg (199 lb 8.3 oz)  BMI 35.35 kg/m2  SpO2 98%   PPS: 30-40%   Intake/Output Summary (Last 24 hours) at 08/19/13 1417 Last data filed at 08/19/13 1100  Gross per 24 hour  Intake    400 ml  Output   1500 ml  Net  -1100 ml   LBM: 08/18/2013                      Physical Exam:  General: Stacie appears relatively comfortable sitting in an upright position attempting to swallow some sour pills. She reports pain is recently controlled at this time HEENT:  Pupils equal round and reactive to light extraocular muscles appear to be intact mucous membranes are dry Chest:   Decreased but clear to auscultation no rhonchi rales or wheezes are present CVS: Regular rate and rhythm positive S1 and S2 don't appreciate an S3 or S4 Abdomen: Soft nontender no guarding positive bowel sounds right-sided drain in place Ext: Warm trace to 1+ edema bilaterally in the lower extremities Neuro: Awake alert oriented with some short-term memory deficits  Labs: CBC    Component Value Date/Time   WBC 10.2 08/19/2013 0346   WBC 13.6* 08/08/2013 1108   RBC 3.50* 08/19/2013 0346   HGB 9.5* 08/19/2013 0346   HGB 8.7* 08/08/2013 1108   HCT 30.0* 08/19/2013 0346   HCT 27.4* 08/08/2013 1108   PLT 501* 08/19/2013 0346   PLT 414* 08/08/2013 1108   MCV 85.7 08/19/2013 0346   MCV 86 08/08/2013 1108   MCH 27.1 08/19/2013 0346   MCH 27.2 08/08/2013 1108   MCHC 31.7 08/19/2013 0346   MCHC 31.8* 08/08/2013 1108   RDW 17.8* 08/19/2013 0346   RDW 17.8* 08/08/2013 1108   LYMPHSABS 2.5 08/08/2013 1108   LYMPHSABS 2.9 07/23/2013 1700   MONOABS 1.2* 07/23/2013 1700   EOSABS 0.2 08/08/2013 1108   EOSABS 0.0 07/23/2013 1700   BASOSABS 0.0 08/08/2013 1108   BASOSABS 0.0 07/23/2013 1700       CMP     Component Value Date/Time   NA 137 08/19/2013 0346   NA 138 08/08/2013 1108   K 3.8 08/19/2013 0346   K 3.1* 08/08/2013 1108   CL 104 08/19/2013 0346   CL 99 08/08/2013 1108   CO2 25 08/19/2013 0346    CO2  30 08/08/2013 1108   GLUCOSE 87 08/19/2013 0346   GLUCOSE 116 08/08/2013 1108   BUN 9 08/19/2013 0346   BUN 8 08/08/2013 1108   CREATININE 0.85 08/19/2013 0346   CREATININE 0.8 08/08/2013 1108   CALCIUM 9.1 08/19/2013 0346   CALCIUM 8.2 08/08/2013 1108   PROT 6.7 08/19/2013 0346   PROT 6.8 08/08/2013 1108   ALBUMIN 2.2* 08/19/2013 0346   AST 27 08/19/2013 0346   AST 41* 08/08/2013 1108   ALT 17 08/19/2013 0346   ALT 41 08/08/2013 1108   ALKPHOS 211* 08/19/2013 0346   ALKPHOS 320* 08/08/2013 1108   BILITOT 0.6 08/19/2013 0346   BILITOT 1.10 08/08/2013 1108   GFRNONAA 65* 08/19/2013 0346   GFRAA 76* 08/19/2013 0346    Chest Xray Reviewed/Impressions: Slightly increased central airway thickening/ peribronchial cuffing  may represent viral respiratory infection, or an inflammatory  process such as COPD or asthma   CT scan of the abd/pelvis Reviewed/Impressions: 1. Interval development of extensive intrahepatic and extrahepatic  biliary ductal dilatation when compared to the recent prior  examination, raising the possibility of percutaneous biliary  drainage failure. Recommend correlation with laboratory analysis and  possible ultrasound as clinically indicated as lack of intravenous  contrast material somewhat limits evaluation.  2. Pancreatic head mass is not well delineated due to lack of  intravenous contrast material. Persistent marked dilation of the  pancreatic duct.  3. Probable multiple hepatic cysts.  4. Small right pleural effusion.  5. Distal thoracic aortic aneurysm.     Time In Time Out Total Time Spent with Patient Total Overall Time  1107 1215 68 min 68 min    Greater than 50%  of this time was spent counseling and coordinating care related to the above assessment and plan.  Penny Arrambide L. Ladona Ridgel, MD MBA The Palliative Medicine Team at [facility] Phone: [phone] Pager: [phone]

## 2013-08-20 LAB — CBC
HCT: 31.3 % — ABNORMAL LOW (ref 36.0–46.0)
MCV: 85.1 fL (ref 78.0–100.0)
Platelets: 457 10*3/uL — ABNORMAL HIGH (ref 150–400)
RDW: 17.5 % — ABNORMAL HIGH (ref 11.5–15.5)
WBC: 10.1 10*3/uL (ref 4.0–10.5)

## 2013-08-20 LAB — COMPREHENSIVE METABOLIC PANEL
Albumin: 2.2 g/dL — ABNORMAL LOW (ref 3.5–5.2)
Alkaline Phosphatase: 185 U/L — ABNORMAL HIGH (ref 39–117)
BUN: 9 mg/dL (ref 6–23)
CO2: 25 mEq/L (ref 19–32)
Chloride: 104 mEq/L (ref 96–112)
Creatinine, Ser: 0.82 mg/dL (ref 0.50–1.10)
GFR calc non Af Amer: 68 mL/min — ABNORMAL LOW (ref >90)
Glucose, Bld: 93 mg/dL (ref 70–99)
Potassium: 4.5 mEq/L (ref 3.5–5.1)
Total Bilirubin: 0.5 mg/dL (ref 0.3–1.2)
Total Protein: 6.9 g/dL (ref 6.0–8.3)

## 2013-08-20 NOTE — Progress Notes (Signed)
Met with Pt to discuss d/c plan.  Pt stated that she is in agreement with SNF and gave CSW permission to begin a SNF search on her behalf.  CSW thanked Pt for her time.  Providence Crosby, LCSWA Clinical Social Work [phone]

## 2013-08-20 NOTE — Progress Notes (Signed)
Subjective: Husband at the bedside. As documented in prior notes extensive talk with daughter yesterday. GI and palliative care have been involved. We will defer on feeding tube and reality of surgical intervention to primary physician Dr. Timothy Lasso. She is clearly very comfortable at this point in no distress.  Objective: Vital signs in last 24 hours: Temp:  [99.1 F (37.3 C)-99.5 F (37.5 C)] 99.5 F (37.5 C) (11/16 0450) Pulse Rate:  [87-90] 87 (11/16 0450) Resp:  [18] 18 (11/16 0450) BP: (144-182)/(35-58) 182/54 mmHg (11/16 0450) SpO2:  [98 %-99 %] 99 % (11/16 0450) Weight change:   CBG (last 3)  No results found for this basename: GLUCAP,  in the last 72 hours  Intake/Output from previous day: 11/15 0701 - 11/16 0700 In: 360 [P.O.:360] Out: 1575 [Urine:400; Drains:1175]  Physical Exam: Awake alert no distress. Good facial symmetry. No JVD or bruits. Lungs are clear. Cardiovascular exam regular rate and rhythm abdomen is obese soft mild epigastric tenderness. Extremities reveal trace edema venous changes intact pulses. Neurologically she is much more awake alert interactive and nonlateralizing   Lab Results:  Recent Labs  08/19/13 0346 08/20/13 0403  NA 137 136  K 3.8 4.5  CL 104 104  CO2 25 25  GLUCOSE 87 93  BUN 9 9  CREATININE 0.85 0.82  CALCIUM 9.1 9.4    Recent Labs  08/19/13 0346 08/20/13 0403  AST 27 19  ALT 17 13  ALKPHOS 211* 185*  BILITOT 0.6 0.5  PROT 6.7 6.9  ALBUMIN 2.2* 2.2*    Recent Labs  08/19/13 0346 08/20/13 0403  WBC 10.2 10.1  HGB 9.5* 9.9*  HCT 30.0* 31.3*  MCV 85.7 85.1  PLT 501* 457*   Lab Results  Component Value Date   INR 1.24 07/24/2013   INR 0.94 07/02/2013   No results found for this basename: CKTOTAL, CKMB, CKMBINDEX, TROPONINI,  in the last 72 hours No results found for this basename: TSH, T4TOTAL, FREET3, T3FREE, THYROIDAB,  in the last 72 hours No results found for this basename: VITAMINB12, FOLATE, FERRITIN,  TIBC, IRON, RETICCTPCT,  in the last 72 hours  Studies/Results: No results found.   Assessment/Plan: #1 Pancreatic carcinoma with biliary obstruction and percutaneous drainage with intractable pain responding nicely to the fentanyl patch. Celiac block planned for Wednesday likely as an outpatient. #2 protein calorie malnutrition on oral supplements the issue of feeding tube I will defer completely to Dr. Timothy Lasso at this time  Dr. Timothy Lasso to help family with decisions on timing of disposition, reality of surgical intervention, to desire and need for feeding tube and help facilitate transfer to a post acute setting   LOS: 6 days   Hibba Schram A 08/20/2013, 11:38 AM

## 2013-08-20 NOTE — Progress Notes (Addendum)
Patient ID: Carolyn [name], female   DOB: [birth year], 77 y.o.   MRN: [id number] [facility] Gastroenterology Progress Note  Carolyn [name] 77 y.o. 1935/12/02   Subjective: Sitting up in bed eating lunch. Husband and brother at bedside. Reports pain under good control since Fentanyl patch dose increased.  Objective: Vital signs: Filed Vitals:   08/20/13 0450  BP: 182/54  Pulse: 87  Temp: 99.5 F (37.5 C)  Resp: 18    Physical Exam: Gen: alert, no acute distress    Lab Results:  Recent Labs  08/19/13 0346 08/20/13 0403  NA 137 136  K 3.8 4.5  CL 104 104  CO2 25 25  GLUCOSE 87 93  BUN 9 9  CREATININE 0.85 0.82  CALCIUM 9.1 9.4    Recent Labs  08/19/13 0346 08/20/13 0403  AST 27 19  ALT 17 13  ALKPHOS 211* 185*  BILITOT 0.6 0.5  PROT 6.7 6.9  ALBUMIN 2.2* 2.2*    Recent Labs  08/19/13 0346 08/20/13 0403  WBC 10.2 10.1  HGB 9.5* 9.9*  HCT 30.0* 31.3*  MCV 85.7 85.1  PLT 501* 457*      Assessment/Plan: Pancreatic cancer with improvement in pain control with Fentanyl patch dose change during the past 48 hours. She is eating better since her pain has been better controlled. With her significant improvement in pain control I do not think that a celiac plexus block will help much more with pain control that she is receiving from her current pain meds. If her pain continues to be well-controlled today and tomorrow AM, then we will cancel the planned celiac plexus block because it's unlikely to provide much more relief then she is already receiving from her current pain meds.   Marja Adderley C. 08/20/2013, 12:59 PM

## 2013-08-20 NOTE — Progress Notes (Addendum)
Clinical Social Work Department CLINICAL SOCIAL WORK PLACEMENT NOTE 08/20/2013  Patient:  Carolyn [name], Carolyn [name]  Account Number:  [id number] Admit date:  08/14/2013  Clinical Social Worker:  Doroteo Glassman  Date/time:  08/19/2013 12:00 M  Clinical Social Work is seeking post-discharge placement for this patient at the following level of care:   SKILLED NURSING   (*CSW will update this form in Epic as items are completed)   08/19/2013  Patient/family provided with [facility] Department of Clinical Social Work's list of facilities offering this level of care within the geographic area requested by the patient (or if unable, by the patient's family).  08/19/2013  Patient/family informed of their freedom to choose among providers that offer the needed level of care, that participate in Medicare, Medicaid or managed care program needed by the patient, have an available bed and are willing to accept the patient.  08/19/2013  Patient/family informed of [facility]' ownership interest in [facility], as well as of the fact that they are under no obligation to receive care at this facility.  PASARR submitted to [employer] on 08/19/2013 PASARR number received from [employer] on 08/19/2013  FL2 transmitted to all facilities in geographic area requested by pt/family on  08/19/2013 FL2 transmitted to all facilities within larger geographic area on   Patient informed that his/her managed care company has contracts with or will negotiate with  certain facilities, including the following:     Patient/family informed of bed offers received:  08/21/2013 Patient chooses bed at [facility] Physician recommends and patient chooses bed at    Patient to be transferred to  on  [facility] on 08/21/2013 Patient to be transferred to facility by pt husband via private vehicle  The following physician request were entered in Epic:   Additional Comments:  Providence Crosby, LCSWA Clinical  Social Work [phone]

## 2013-08-21 ENCOUNTER — Ambulatory Visit
Admission: RE | Admit: 2013-08-21 | Discharge: 2013-08-21 | Disposition: A | Discharge status: Home / Self Care | Payer: Medicare Other | Source: Ambulatory Visit | Attending: Radiation Oncology | Admitting: Radiation Oncology

## 2013-08-21 ENCOUNTER — Telehealth: Payer: Self-pay | Admitting: Hematology & Oncology

## 2013-08-21 LAB — COMPREHENSIVE METABOLIC PANEL
AST: 19 U/L (ref 0–37)
Albumin: 2.4 g/dL — ABNORMAL LOW (ref 3.5–5.2)
Calcium: 9.3 mg/dL (ref 8.4–10.5)
Chloride: 104 mEq/L (ref 96–112)
Creatinine, Ser: 0.88 mg/dL (ref 0.50–1.10)
Total Bilirubin: 0.6 mg/dL (ref 0.3–1.2)

## 2013-08-21 LAB — CBC
HCT: 32 % — ABNORMAL LOW (ref 36.0–46.0)
Hemoglobin: 10 g/dL — ABNORMAL LOW (ref 12.0–15.0)
MCH: 26.5 pg (ref 26.0–34.0)
MCV: 84.7 fL (ref 78.0–100.0)
RBC: 3.78 MIL/uL — ABNORMAL LOW (ref 3.87–5.11)

## 2013-08-21 MED ORDER — POLYETHYLENE GLYCOL 3350 17 G PO PACK: 17.0000 g | PACK | Freq: Every day | ORAL | Status: DC

## 2013-08-21 MED ORDER — MORPHINE SULFATE 4 MG/ML IJ SOLN: 4.0000 mg | Freq: Two times a day (BID) | INTRAMUSCULAR | Status: DC | PRN

## 2013-08-21 MED ORDER — DRONABINOL 2.5 MG PO CAPS: ORAL_CAPSULE | ORAL | Status: DC

## 2013-08-21 MED ORDER — AMLODIPINE BESYLATE 5 MG PO TABS
5.0000 mg | ORAL_TABLET | Freq: Every day | ORAL | Status: DC
  Filled 2013-08-21: qty 1

## 2013-08-21 MED ORDER — METOCLOPRAMIDE HCL 5 MG PO TABS: 5.0000 mg | ORAL_TABLET | Freq: Three times a day (TID) | ORAL | Status: DC

## 2013-08-21 MED ORDER — HYDROCORTISONE 1 % EX CREA: TOPICAL_CREAM | CUTANEOUS | Status: DC

## 2013-08-21 MED ORDER — FENTANYL 50 MCG/HR TD PT72: 50.0000 ug | MEDICATED_PATCH | TRANSDERMAL | Status: DC

## 2013-08-21 MED ORDER — MIRTAZAPINE 30 MG PO TABS: 30.0000 mg | ORAL_TABLET | Freq: Every day | ORAL | Status: DC

## 2013-08-21 MED ORDER — ONDANSETRON HCL 4 MG/2ML IJ SOLN
4.0000 mg | Freq: Two times a day (BID) | INTRAMUSCULAR | Status: DC | PRN
  Administered 2013-08-21: 4 mg via INTRAVENOUS
  Filled 2013-08-21: qty 2

## 2013-08-21 MED ORDER — BOOST PLUS PO LIQD: 237.0000 mL | Freq: Two times a day (BID) | ORAL | Status: DC

## 2013-08-21 MED ORDER — ALPRAZOLAM 0.25 MG PO TABS: ORAL_TABLET | ORAL | Status: DC

## 2013-08-21 MED ORDER — BOOST / RESOURCE BREEZE PO LIQD: 1.0000 | Freq: Two times a day (BID) | ORAL | Status: DC

## 2013-08-21 MED ORDER — HYDROCODONE-ACETAMINOPHEN 5-325 MG PO TABS: 1.0000 | ORAL_TABLET | Freq: Four times a day (QID) | ORAL | Status: DC | PRN

## 2013-08-21 MED ORDER — AMLODIPINE BESYLATE 5 MG PO TABS: 5.0000 mg | ORAL_TABLET | Freq: Every day | ORAL | Status: DC

## 2013-08-21 NOTE — Progress Notes (Signed)
Per MD order radiation treatment was moved up to 0900 today as to not interfere with possible d/c

## 2013-08-21 NOTE — Progress Notes (Signed)
Pt. Was discharge to [facility].  Report was called to facility staff.  Discharge instructions, prescriptions, and packet were sent with pt. At discharge.  Pt. Was transported to facility by husband.

## 2013-08-21 NOTE — Progress Notes (Addendum)
Subjective: Less pain than admission and actually says none today on current Rxs. Had BM yesterday. Very poor appetite but she is eating.  She is on air inlay mattress and moving Around better. Fentanyl patch on and dose increased over weekend. Appreciate GI/ONC/Rad Onc and hospice team evals. Celiac Block for Wednesday may be cancelled if we can keep pain controlled. Drainage Catheter is opened and she is not getting DeH.   Objective: Vital signs in last 24 hours: Temp:  [98.2 F (36.8 C)-98.8 F (37.1 C)] 98.2 F (36.8 C) (11/17 0525) Pulse Rate:  [82-101] 101 (11/17 0525) Resp:  [16-18] 16 (11/17 0525) BP: (161-186)/(55-61) 181/61 mmHg (11/17 0525) SpO2:  [98 %-99 %] 98 % (11/17 0525) Weight change:  Last BM Date: 08/19/13  CBG (last 3)  No results found for this basename: GLUCAP,  in the last 72 hours  Intake/Output from previous day:  Intake/Output Summary (Last 24 hours) at 08/21/13 0714 Last data filed at 08/21/13 0512  Gross per 24 hour  Intake    960 ml  Output   2550 ml  Net  -1590 ml   11/16 0701 - 11/17 0700 In: 960 [P.O.:960] Out: 2550 [Urine:1650; Drains:900]   Physical Exam  General appearance: flat affect and in pain - matter of fact. Eyes: no scleral icterus Throat: oropharynx moist without erythema Resp: CTA Cardio: Reg GI:  Biliary drain is CDI - open and draining. No palpable pain. Extremities: no clubbing, cyanosis or edema    Lab Results:  Recent Labs  08/20/13 0403 08/21/13 0353  NA 136 136  K 4.5 4.3  CL 104 104  CO2 25 23  GLUCOSE 93 98  BUN 9 9  CREATININE 0.82 0.88  CALCIUM 9.4 9.3     Recent Labs  08/20/13 0403 08/21/13 0353  AST 19 19  ALT 13 12  ALKPHOS 185* 191*  BILITOT 0.5 0.6  PROT 6.9 7.2  ALBUMIN 2.2* 2.4*     Recent Labs  08/20/13 0403 08/21/13 0353  WBC 10.1 11.2*  HGB 9.9* 10.0*  HCT 31.3* 32.0*  MCV 85.1 84.7  PLT 457* 456*    Lab Results  Component Value Date   INR 1.24 07/24/2013    INR 0.94 07/02/2013    No results found for this basename: CKTOTAL, CKMB, CKMBINDEX, TROPONINI,  in the last 72 hours  No results found for this basename: TSH, T4TOTAL, FREET3, T3FREE, THYROIDAB,  in the last 72 hours  No results found for this basename: VITAMINB12, FOLATE, FERRITIN, TIBC, IRON, RETICCTPCT,  in the last 72 hours  Micro Results: No results found for this or any previous visit (from the past 240 hour(s)).   Studies/Results: No results found.   Medications: Scheduled: . docusate sodium  100 mg Oral BID  . feeding supplement (RESOURCE BREEZE)  1 Container Oral BID BM  . fentaNYL  50 mcg Transdermal Q72H  . heparin  5,000 Units Subcutaneous Q8H  . lactose free nutrition  237 mL Oral BID BM  . lipase/protease/amylase  3 capsule Oral TID WC  . metoCLOPramide  5 mg Oral TID AC  . mirtazapine  30 mg Oral QHS  . multivitamin with minerals  1 tablet Oral Daily  . pantoprazole  40 mg Oral Daily  . polyethylene glycol  17 g Oral Daily  . potassium chloride  40 mEq Oral BID   Continuous: . sodium chloride 20 mL/hr (08/19/13 2141)     Assessment/Plan: Principal Problem:   Pancreatic cancer Active Problems:  Protein-calorie malnutrition, severe   Anemia   Leukocytosis   Intractable abdominal pain  Borderline Resectable Pancreatic CA - Attempt Pain/nausea control.  CA 19-9 remains very low <1.2. S/P 13/28 XRT.  14th to be done 11/17 Chemo on Hold and d/ced per Dr Myna Hidalgo. DNR  Ab pain - Will clamp/Unclamp Biliary Drainage Catheter as needed to allow drainage if felt that Biliary fluid pressure contributing to pain. Appreciate IR Input from earlier in hospital stay. Duragesic patch for pain control c prn PO Hydrocodone. After talking c Dr Myna Hidalgo and Dr Mitzi Hansen our current goals is to get pain controlled, get her more functional, get her eating some more and figure out short term needs.  We will finish out the XRT and see where we end up.  However if she gets worse  or declares herself more we will have no other option but Hospice palliative care only.  I did Consult Hospice palliative Care and appreciate Dr Bruna Potter input:  1. Code Status: DO NOT RESUSCITATE  2. Scope of Treatment:  Patient desires to continue the full scope of treatment at this time including antibiotic therapy if necessary, radiation treatments, possible celiac block if this is still planned for next week, and she would consider PEG tube site feedings if it would support her towards getting a surgical intervention  4. Disposition: Skilled nursing placement for rehabilitation with intent to discharge to home as soon as possible  3. Symptom Management:  1. Pain: Patient states that her fentanyl patch appears to be providing better pain control. She's not pleased with the mattress on her bed and we will attempt to see if an air mattress overlay we'll give her more comfort. Would continue with current regimen using Vicodin for breakthrough and sentinel for baseline pain control. 2. Failure to thrive anorexia: Agree with Remeron in collaboration with Reglan. 3. Depression: Patient appears to be doing fairly well with stimulation from her family, addition of Remeron. Celexa could be added however my concern is the patient having difficulty taking most of her oral medications now. If the Remeron works been single therapy is all that is necessary. Family and physicians should continue to encourage the patient towards independence which will help to improve her depression.  4. currently there is no reported restriction to activities physical therapy should make attempts to continue to work with her 5. Anxiety continue Xanax as needed per orders 6. Surgical opinion: Would recommend possibly having Dr. Donell Beers speak with the patient on Monday regarding her updated recommendations if the patient can plan accordingly. Will discuss with Dr. Timothy Lasso on Monday. Discussed with Dr. Myna Hidalgo today who agrees this  information should be provided directly from surgery. 4. Psychosocial: patient is supported by her family surrounding her which encourages her in a blister. She is married, her daughter has arrived from [place] with their grandson  5. Spiritual: patient appreciates up lifting support from her [place] physicians and friends. Reminded her that chaplain's are available any time if one of the other are not available.   Anorexia c prot cal malnutrition - Push PO Intake. Marinol.  She hated the Megace.  Continue Liq cal supplementation. The only reason we would do a PEJ is to buy time for an eventual surgery, but if she will not make it to surgery a PEJ may prolong suffering.  HTN. NTD. Follow even though high BP currently  Leukocytosis - Stable and NTD.  Anemia c Hbg up to 10. Hypokalemia - Repleted  Gen Weakness and AFTT -  PT/OT/SW. Work on function - D/C to SNF when bed available  Nausea - Reglan standing c IV and oral Zofran prn. Constipation- Miralax. Continue Reglan and Colace. DVT Prophylaxis - SQ hep post procedure.  SCDs.   I have done her Outpatient MAR.  I signed FL-2, I signed OOF DNR.  I will call Dr Donell Beers.  I asked nursing to call down and get XRT done early today.  I have a D/C summary nearly complete.  I will hep lock her IV.  She can transition to a SNF today if bed available.   LOS: 7 days   Glendell Fouse M 08/21/2013, 7:14 AM

## 2013-08-21 NOTE — Progress Notes (Signed)
CSW continuing to follow for SNF placement.  CSW met with pt and pt spouse at bedside to discuss SNF bed offers.  Pt has two bed offers at this time, but per pt and pt husband the facilities are the closest to pt home.   Pt spouse plans to tour facilities and CSW to follow up with decision.  Per MD, pt medically ready for d/c today if SNF bed available.  CSW to continue to follow.  Jacklynn Lewis, MSW, LCSWA  Clinical Social Work [phone]

## 2013-08-21 NOTE — Progress Notes (Signed)
CSW received notification that pt husband had returned from touring SNF facilities.   CSW met with pt and pt husband at bedside.  Pt husband stated that he preferred [facility]. Pt is in agreement to [facility].  CSW provided support as pt discussed that she was not satisfied that she had to make a decision re: SNF in such a short period of time. CSW discussed that per MD, MD felt pt was medically ready for discharge today and that is why CSW requested a decision in a timely manner in order to arrange for SNF discharge. Pt is agreeable to transitioning to SNF today and requested to speak with MD.   CSW contacted MD to notify that pt had chosen at bed at [facility]. CSW notified MD that pt requesting to speak with MD.   MD confirmed that pt medically ready for discharge today.  CSW to facilitate pt discharge needs this afternoon.  Jacklynn Lewis, MSW, LCSWA  Clinical Social Work [phone]

## 2013-08-21 NOTE — Progress Notes (Signed)
Occupational Therapy Treatment Patient Details Name: Carolyn [name] MRN: [id number] DOB: [birth year] Today's Date: 08/21/2013 Time: 4098-1191 OT Time Calculation (min): 23 min  OT Assessment / Plan / Recommendation  History of present illness Admitted with  [name] on 08/14/13,  decreased PO intake and CT Ab c/w Interval development of extensive intrahepatic and extrahepatic biliary ductal dilatation       Follow Up Recommendations  SNF             Frequency Min 2X/week   Progress towards OT Goals Progress towards OT goals: Progressing toward goals  Plan Discharge plan needs to be updated    Precautions / Restrictions Precautions Precautions: Fall Precaution Comments: R drain,chemo precautions Restrictions Weight Bearing Restrictions: No       ADL  Lower Body Bathing: Minimal assistance Where Assessed - Lower Body Bathing: Unsupported sit to stand Lower Body Dressing: Minimal assistance Where Assessed - Lower Body Dressing: Unsupported sit to stand Toilet Transfer: Minimal assistance Toilet Transfer Method: Sit to stand Toilet Transfer Equipment: Regular height toilet Where Assessed - Toileting Clothing Manipulation and Hygiene: Standing      OT Goals(current goals can now be found in the care plan section) Acute Rehab OT Goals Patient Stated Goal: i just want to feel better  Visit Information  Last OT Received On: 08/21/13 History of Present Illness: Admitted with  [name] on 08/14/13,  decreased PO intake and CT Ab c/w Interval development of extensive intrahepatic and extrahepatic biliary ductal dilatation           Cognition  Cognition Arousal/Alertness: Awake/alert Behavior During Therapy: WFL for tasks assessed/performed Overall Cognitive Status: Within Functional Limits for tasks assessed    Mobility  Bed Mobility Supine to Sit: 5: Supervision;HOB flat Sitting - Scoot to Edge of Bed: 7: Independent Sit to Supine: 7:  Independent Details for Bed Mobility Assistance: No external assist given. Pt was able to get back into bed but with some difficulty and awkwardness noted Transfers Transfers: Sit to Stand;Stand to Sit Sit to Stand: 4: Min guard;From bed;From toilet;With upper extremity assist Stand to Sit: 5: Supervision;To toilet;To bed;With upper extremity assist Details for Transfer Assistance: increased time.  Pt stops for breaks.           End of Session OT - End of Session Activity Tolerance: Patient tolerated treatment well Patient left: in bed with call bell and husband present  GO     [name], [place] Kung 08/21/2013, 11:17 AM

## 2013-08-21 NOTE — Progress Notes (Signed)
Carolyn [name] 10:33 AM  Subjective: Patient did have a bout of pain after a bowel movement today but her pain comes and goes at various times it is not always related to eating or bowel movement and she has no new complaints and we discussed her external drain as well  Objective: Vital signs stable afebrile no acute distress abdomen overall is soft nontender  Assessment: Pancreatic cancer  Plan: I rediscussed celiac block with the patient and her husband and if she is here tomorrow we will decide tomorrow whether to proceed or not however if she goes to a nursing facility her husband will call my partner Dr. Dulce Sellar tomorrow and decide if we should proceed as an outpatient on Wednesday  [facility] E

## 2013-08-21 NOTE — Progress Notes (Signed)
Pt for discharge to [facility].  CSW facilitated pt discharge needs including contacting facility, faxing pt discharge information via [facility], discussed with pt and pt husband at bedside, provided RN phone number to call report, and pt husband plans to transport via private vehicle.   No further social work needs identified at this time.  CSW signing off.  Jacklynn Lewis, MSW, LCSWA  Clinical Social Work [phone]

## 2013-08-21 NOTE — Telephone Encounter (Signed)
Spoke w pt's dau Burna Mortimer) today regarding her FMLA papers. She wants them mailed to her [facility] home. She has spoken with Dr. Myna Hidalgo about these papers. However, she is not on any consent forms. At least, I did not see any.  Mailing to:  [address] [place], [place]  [address]  COPY SCANNED

## 2013-08-21 NOTE — Discharge Summary (Signed)
Physician Discharge Summary  DISCHARGE SUMMARY   Patient ID: Carolyn [name] MR#: [id number] DOB/AGE: [id number] 75 y.o.   Attending Physician:Aleida Crandell M  Patient's WUJ:WJXBJ,YNWG M, MD  Consults:Treatment Team:  Josph Macho, MD Palliative Clement Sayres, MD**  Admit date: 08/14/2013 Discharge date: 08/21/2013  Discharge Diagnoses:  Principal Problem:   Pancreatic cancer Active Problems:   Protein-calorie malnutrition, severe   Anemia   Leukocytosis   Intractable abdominal pain   Patient Active Problem List   Diagnosis Date Noted  . Anemia 08/14/2013  . Leukocytosis 08/14/2013  . Intractable abdominal pain 08/14/2013  . Acute renal failure 07/23/2013  . Malignant tumor head pancreas 07/19/2013  . Pancreatic cancer 07/12/2013  . Protein-calorie malnutrition, severe 07/03/2013  . Obstructive jaundice 07/02/2013  . Pancreatic mass 07/02/2013  . Abdominal pain 07/02/2013  . Pruritus 07/02/2013  . Acute pancreatitis 07/02/2013  . Essential hypertension 05/04/2013  . Hyperlipidemia 05/04/2013  . Multinodular goiter (nontoxic) 06/22/2012   Past Medical History  Diagnosis Date  . Hypertension   . Hyperlipidemia   . GERD (gastroesophageal reflux disease)   . Asthma   . Obstructive jaundice 06/2013  . Pancreatitis   . Arthritis   . Cancer   . Heart murmur   . Thyroid disease   . Pancreatic cancer 07/05/13    pancreatic head=adenocarcinoma    Discharged Condition: fair   Discharge Medications:   Medication List    STOP taking these medications       megestrol 400 MG/10ML suspension  Commonly known as:  MEGACE     XELODA 500 MG tablet  Generic drug:  capecitabine      TAKE these medications       acetaminophen 325 MG tablet  Commonly known as:  TYLENOL  Take 1 tablet (325 mg total) by mouth every 6 (six) hours as needed.     ALPRAZolam 0.25 MG tablet  Commonly known as:  XANAX  Take 1-2 pills, IF NEEDED, every 8 hr for  anxiety/sleep     amLODipine 5 MG tablet  Commonly known as:  NORVASC  Take 1 tablet (5 mg total) by mouth daily.     cholecalciferol 1000 UNITS tablet  Commonly known as:  VITAMIN D  Take 1,000 Units by mouth daily.     dronabinol 2.5 MG capsule  Commonly known as:  MARINOL  Take 1 pill 3 times a day.     DSS 100 MG Caps  Take 100 mg by mouth 2 (two) times daily.     esomeprazole 40 MG capsule  Commonly known as:  NEXIUM  Take 40 mg by mouth daily before breakfast.     feeding supplement (ENSURE COMPLETE) Liqd  Take 237 mLs by mouth 2 (two) times daily between meals as needed (Please offer to pt if eating <50% of meals).     feeding supplement (RESOURCE BREEZE) Liqd  Take 1 Container by mouth 2 (two) times daily between meals.     lactose free nutrition Liqd  Take 237 mLs by mouth 2 (two) times daily between meals.     fentaNYL 50 MCG/HR  Commonly known as:  DURAGESIC - dosed mcg/hr  Place 1 patch (50 mcg total) onto the skin every 3 (three) days.     HYDROcodone-acetaminophen 5-325 MG per tablet  Commonly known as:  NORCO/VICODIN  Take 1 tablet by mouth every 6 (six) hours as needed.     hydrocortisone cream 1 %  Apply topically 2 (two) times daily to  hemorrhoids as needed.     lipase/protease/amylase 16109 UNITS Cpep capsule  Commonly known as:  CREON-12/PANCREASE  Take 3 capsules by mouth 3 (three) times daily with meals.     metoCLOPramide 5 MG tablet  Commonly known as:  REGLAN  Take 1 tablet (5 mg total) by mouth 3 (three) times daily before meals.     mirtazapine 30 MG tablet  Commonly known as:  REMERON  Take 1 tablet (30 mg total) by mouth at bedtime.     multivitamin with minerals Tabs tablet  Take 1 tablet by mouth daily.     ondansetron 8 MG tablet  Commonly known as:  ZOFRAN  Take 1 tablet (8 mg total) by mouth every 8 (eight) hours as needed for nausea.     polyethylene glycol packet  Commonly known as:  MIRALAX / GLYCOLAX  Take 17 g by  mouth daily.     potassium chloride SA 20 MEQ tablet  Commonly known as:  K-DUR,KLOR-CON  Take 1 tablet daily.        Hospital Procedures: Ct Abdomen Pelvis Wo Contrast  08/14/2013   CLINICAL DATA:  Generalized abdominal pain. History of pancreatic cancer.  EXAM: CT ABDOMEN AND PELVIS WITHOUT CONTRAST  TECHNIQUE: Multidetector CT imaging of the abdomen and pelvis was performed following the standard protocol without intravenous contrast.  COMPARISON:  CT abdomen pelvis 06/24/2013; 07/23/2013.  FINDINGS: Small right pleural effusion. Minimal dependent atelectasis within the left greater than right lower lobes. Normal heart size. There is a 3.7 cm thoracic aortic aneurysm.  Lack of intravenous contrast material limits evaluation of the solid organ parenchyma.  Multiple hepatic cysts, re- demonstrated. Percutaneous biliary drainage catheter courses through the left hepatic lobe with the distal tip coiled in the duodenum. There has been interval dilation of the her intrahepatic and extrahepatic biliary ductal system. Spleen is grossly unremarkable. Re- demonstrated marked dilatation of the main pancreatic duct measuring up to 1.4 cm. Known pancreatic head mass is difficult to delineate without intravenous contrast material.  Bilateral adrenal glands are unremarkable. Kidneys are symmetric in size. No hydronephrosis. Unchanged cyst within the superior pole of the left kidney. An partially exophytic cyst within the interpolar region of the right kidney.  Extensive calcification of the normal caliber abdominal aorta. No retroperitoneal lymphadenopathy. Urinary bladder is unremarkable. Descending and sigmoid colonic diverticulosis. No evidence for acute diverticulitis.  Normal appendix. No free fluid or free intraperitoneal air.  If lower lumbar spine degenerative change. No aggressive osseous lesions.  IMPRESSION: 1. Interval development of extensive intrahepatic and extrahepatic biliary ductal dilatation when  compared to the recent prior examination, raising the possibility of percutaneous biliary drainage failure. Recommend correlation with laboratory analysis and possible ultrasound as clinically indicated as lack of intravenous contrast material somewhat limits evaluation. 2. Pancreatic head mass is not well delineated due to lack of intravenous contrast material. Persistent marked dilation of the pancreatic duct. 3. Probable multiple hepatic cysts. 4. Small right pleural effusion. 5. Distal thoracic aortic aneurysm.   Electronically Signed   By: Annia Belt M.D.   On: 08/14/2013 16:36   Ct Abdomen Wo Contrast  07/24/2013   CLINICAL DATA:  Abdominal pain. Pancreatitis. Pancreatic carcinoma. Biliary drainage catheter. Elevated white blood count. Renal insufficiency.  EXAM: CT ABDOMEN WITHOUT CONTRAST  TECHNIQUE: Multidetector CT imaging of the abdomen was performed following the standard protocol without IV contrast.  COMPARISON:  CT abdomen 07/02/2013  FINDINGS: Interval placement of a biliary drainage catheter through the left lobe  and into the common bile duct and into the duodenum. Bile ducts are now adequately decompressed. Multiple hepatic low-density lesions are stable may represent cysts. Pancreatic head mass is better seen on the prior study. This is partially obscured by streak artifact from barium in the colon.  Negative for bowel obstruction. No adenopathy. Atherosclerotic aorta. No free fluid. No adenopathy. Lung bases are clear.  IMPRESSION: Biliary drainage catheter in satisfactory position with decompression of the bile ducts.  Multiple liver lesions compatible with cysts.  No evidence of pancreatitis. Pancreatic head mass not well seen on the current study due to artifact from barium and lack of intravenous contrast.   Electronically Signed   By: Marlan Palau M.D.   On: 07/24/2013 00:12   X-ray Chest Pa And Lateral   07/24/2013   CLINICAL DATA:  Shortness of breath. Abdominal pain. Acute  pancreatitis. History of pancreatic cancer.  EXAM: CHEST  2 VIEW  COMPARISON:  07/02/2013  FINDINGS: The heart size and mediastinal contours are within normal limits. Both lungs are clear. The visualized skeletal structures are unremarkable. Displacement of the trachea towards the right consistent with thyroid goiter. Postoperative changes in the cervical spine. Calcified and tortuous aorta. Degenerative changes in the spine and shoulders. No significant change since previous study.  IMPRESSION: No active cardiopulmonary disease.   Electronically Signed   By: Burman Nieves M.D.   On: 07/24/2013 00:11   Dg Chest Port 1v Same Day  07/27/2013   CLINICAL DATA:  Wheezing, short of breath  EXAM: PORTABLE CHEST - 1 VIEW SAME DAY  COMPARISON:  Prior chest x-ray 07/24/2013  FINDINGS: Minimally increased central airway thickening and peribronchial cuffing. No focal airspace consolidation, or pleural effusion. No pneumothorax. No pulmonary edema. Stable cardiac and mediastinal contours. Atherosclerotic and tortuous thoracic aorta. Oral contrast material noted in the splenic flexure of the colon. Degenerative changes noted in both acromioclavicular joints and at the insertions of the rotator cuff tendons.  IMPRESSION: Slightly increased central airway thickening/ peribronchial cuffing may represent viral respiratory infection, or an inflammatory process such as COPD or asthma.   Electronically Signed   By: Malachy Moan M.D.   On: 07/27/2013 08:09    History of Present Illness: 64 F c Borderline Resectable Pancreatic CA undergoing Chemo and Radiation to hopefully shrink the tumor so that Dr Donell Beers may be able to do a Whipple. I received a call from Husband on 08/14/13 that Ms Ensz was having more Ab pain and was not eating or drinking and may be Dehydrated again. She also c/o of not being able to get a deep breath. i sent her to [facility] ED. Here labs were better than expected. She was given MSO4 for pain, Zofran  for Nausea, IVFs and I was called by ED. We elected to get non IV contrasted Ab CT (recent renal issues made [place] leary of the IV Contrast) and it showed Interval development of extensive intrahepatic and extrahepatic biliary ductal dilatation when compared to the recent prior examination, raising the possibility of percutaneous biliary drainage failure. The ED called IR and put Ms Fath on the schedule for 08/15/13 procedure. I was called for inpt admit. I unclamped the drainage catheter and allowed drainage overnight.      Hospital Course: She was admitted with significant Ab pain.  The drainage catheter was clamped and unclamped as needed.  Currently unclamped and not dehydrated.  She was admitted last month c ARF - Cr > 8 when she had lots  of insensible losses from the catheter and could not keep up with hydration.  Periodic BMETs will be needed.  She never underwent IR Procedure as it was not necessary, but on 11/14 Biliary drain bag leaking, changed by IR , drain left unclamped, draining brownish fluid. Patient c/o abdominal pain. Medicated. Fentanyl was started and titrated.  She has less pain than admission and reports no current pains.  B/c of cancer, early satiety and prior pains she is eating poor and not much. Nutritional services has helped here inpatient. Fentanyl patch on and dose increased over weekend.  Appreciate GI/ONC/Rad Onc and hospice team evals.  Celiac Block for Wednesday may be cancelled if we can keep pain controlled. Had BM 11/16.  Very poor appetite but she is eating. She is on air inlay mattress and moving Around better than admission.  Drainage Catheter is currently opened and she is not getting DeH - monitor output carefully   Borderline Resectable Early Stage Pancreatic CA that is just in a terrible area - Attempt Pain/nausea control. CA 19-9 remains very low <1.2.  S/P 13/28 XRT. 14th to be done 11/17  Chemo (Xeloda) on Hold and d/ced per Dr Myna Hidalgo. The ? Is whether  she will ever be a Surgical candidate. DNR  Ab pain - Will clamp/Unclamp Biliary Drainage Catheter as needed to allow drainage if felt that Biliary fluid pressure contributing to pain. Appreciate IR Input from earlier in hospital stay.  Duragesic patch for pain control c prn PO Hydrocodone.  After talking c Dr Myna Hidalgo and Dr Mitzi Hansen our current goals is to get pain controlled, get her more functional, get her eating some more and figure out short term and long term needs. We will finish out the XRT and see where we end up. However if she gets worse or declares herself more we will have no other option but Hospice palliative care only. I did Consult Hospice palliative Care and appreciate Dr Bruna Potter input:  1. Code Status: DO NOT RESUSCITATE  2. Scope of Treatment:  Patient desires to continue the full scope of treatment at this time including antibiotic therapy if necessary, radiation treatments, possible celiac block if this is still planned for next week, and she would consider PEG tube site feedings if it would support her towards getting a surgical intervention  4. Disposition: Skilled nursing placement for rehabilitation with intent to discharge to home as soon as possible  3. Symptom Management:  1. Pain: Patient states that her fentanyl patch appears to be providing better pain control. She's not pleased with the mattress on her bed and we will attempt to see if an air mattress overlay we'll give her more comfort. Would continue with current regimen using Vicodin for breakthrough and sentinel for baseline pain control. 2. Failure to thrive anorexia: Agree with Remeron in collaboration with Reglan. 3. Depression: Patient appears to be doing fairly well with stimulation from her family, addition of Remeron. Celexa could be added however my concern is the patient having difficulty taking most of her oral medications now. If the Remeron works been single therapy is all that is necessary. Family and  physicians should continue to encourage the patient towards independence which will help to improve her depression.  4. currently there is no reported restriction to activities physical therapy should make attempts to continue to work with her 5. Anxiety continue Xanax as needed per orders 6. Surgical opinion: Would recommend possibly having Dr. Donell Beers speak with the patient on Monday regarding her  updated recommendations if the patient can plan accordingly. Will discuss with Dr. Timothy Lasso on Monday. Discussed with Dr. Myna Hidalgo today who agrees this information should be provided directly from surgery. 4. Psychosocial: patient is supported by her family surrounding her which encourages her in a blister. She is married, her daughter has arrived from [place] with their grandson  5. Spiritual: patient appreciates up lifting support from her [place] physicians and friends. Reminded her that chaplain's are available any time if one of the other are not available.   Anorexia c prot cal malnutrition - Push PO Intake. Marinol. She hated the Megace. Continue Liq cal supplementation.  I put all 3 supplements on her D/C med list and expect that she take as much as she can and what she likes. The only reason we would do a PEJ is to buy time for an eventual surgery, but if she will not make it to surgery a PEJ may prolong suffering.   HTN. NTD. Follow even though high BP currently   Leukocytosis - Stable and NTD.   Anemia c Hbg up to 10.   Hypokalemia - Repleted   Gen Weakness and AFTT - PT/OT/SW.  For SNF when bed available  Nausea - Reglan standing c oral Zofran prn.  Constipation- Miralax and Colace.  DVT Prophylaxis - SQ hep and SCDs provided.  Current goal is to provide pain control so she can function, eat and finish XRT.  After XRT she needs to be re-evaluated.  My optimism that she will be able strong enough to survive getting her cancer resected is diminishing greatly.  However she can go to SNF and  push PO intake, strength, get some function back, finish out her XRT and see where we end up.  Hospice has already seen her and if she worsens, we can easily transition to hospice palliative care approach.  Her radiation treatment was moved up to 0900 today as to not interfere with possible d/c.  It was 14/28 Dr Gustavo Lah office was contacted @ getting FMLA papers done for Ms Blackson' daughter. Dr Ewing Schlein saw her today and stated: Patient did have a bout of pain after a bowel movement today but her pain comes and goes at various times it is not always related to eating or bowel movement and she has no new complaints and we discussed her external drain as well.  He rediscussed celiac block with the patient and her husband and if she is here (in hospital) tomorrow we will decide tomorrow whether to proceed or not. However if she goes to a nursing facility her husband will call my partner Dr. Dulce Sellar tomorrow 11/18 and decide if we should proceed as an outpatient on Wednesday.  She was seen by OT, SW.  They have chosen [facility].   I have spoken c Dr Donell Beers today and she will try and stop by prior to her D/c to discuss whether she is still a candidate for Whipple and or if PEJ/TNA should be entertained.  I talked to Ms Klier on phone @ 2pm and we discussed her BP and elected to start back BP meds - Norvasc 5 mg PO daily.  Will watch BP.  i am sure some BP is up do to all the fluids she received.  She is at risk of dehydration so we will not diurese.  Will monitor closely.  No HA or issues.  Can go to facility and adjust prn.  Dr Donell Beers texted me that if she cannot get  to ms [place] in time she will arrange to have her see her in clinic.     Day of Discharge Exam BP 190/83  Pulse 118  Temp(Src) 98.4 F (36.9 C) (Oral)  Resp 18  Ht 5\' 3"  (1.6 m)  Wt 90.5 kg (199 lb 8.3 oz)  BMI 35.35 kg/m2  SpO2 98%  Physical Exam: See PN  Discharge Labs:  Recent Labs  08/20/13 0403  08/21/13 0353  NA 136 136  K 4.5 4.3  CL 104 104  CO2 25 23  GLUCOSE 93 98  BUN 9 9  CREATININE 0.82 0.88  CALCIUM 9.4 9.3    Recent Labs  08/20/13 0403 08/21/13 0353  AST 19 19  ALT 13 12  ALKPHOS 185* 191*  BILITOT 0.5 0.6  PROT 6.9 7.2  ALBUMIN 2.2* 2.4*    Recent Labs  08/20/13 0403 08/21/13 0353  WBC 10.1 11.2*  HGB 9.9* 10.0*  HCT 31.3* 32.0*  MCV 85.1 84.7  PLT 457* 456*   No results found for this basename: CKTOTAL, CKMB, CKMBINDEX, TROPONINI,  in the last 72 hours No results found for this basename: TSH, T4TOTAL, FREET3, T3FREE, THYROIDAB,  in the last 72 hours No results found for this basename: VITAMINB12, FOLATE, FERRITIN, TIBC, IRON, RETICCTPCT,  in the last 72 hours Lab Results  Component Value Date   INR 1.24 07/24/2013   INR 0.94 07/02/2013       Discharge instructions:  Future Appointments Provider Department Dept Phone   08/22/2013 3:40 PM [facility] [phone]   08/23/2013 3:40 PM [facility] [place] [facility] [phone]   08/24/2013 3:40 PM [facility] [phone]   08/25/2013 10:45 AM Rachael Fee [facility] [phone]   08/25/2013 11:15 AM Josph Macho, MD [place] [phone]   08/25/2013 11:45 AM [facility] [place] [facility] [phone]   08/25/2013 12:45 PM [facility] [place] [facility] [phone]   08/28/2013 3:40 PM [facility] [phone]   08/29/2013 3:40 PM [facility] [place] [facility] [phone]   08/30/2013 3:40 PM [facility] [phone]   09/04/2013 3:40 PM [facility] [phone]   09/05/2013 3:40 PM [facility] [place] [facility] [phone]   09/06/2013 3:40 PM [facility] [place] [facility] [facility] [phone]   09/07/2013 3:40 PM [facility] [phone]   09/08/2013 12:00 PM Almond Lint, MD [facility] [place] [phone]   09/08/2013 3:40 PM [facility] [place] [facility] [phone]   09/11/2013 3:40 PM [facility] [place] [facility] [phone]   09/12/2013 5:10 PM [facility] [place] [facility] [phone]   10/12/2013 11:00 AM Baltazar Najjar [facility] [phone]   10/12/2013 12:00 PM Krista [facility] [phone]     01-Home or Self Care Follow-up Information   Follow up with Josph Macho, MD In 2 weeks.   Specialty:  Oncology   Contact information:   [address], [address] [place]  [address] [phone]       Follow up with Jonna Coup, MD. (daily for Radiation treatments.)    Specialty:  Radiation Oncology   Contact information:   [address] [place] [address] [phone]       Follow up with Freddy Jaksch, MD. (if we will be pursuing Celiac Block or PEJ)    Specialty:  Gastroenterology   Contact information:   [address], [address] [place] [address] [phone]       Follow up with Almond Lint, MD. (To discuss if we will be able to get her to surgery.)    Specialty:  General Surgery   Contact information:   [address] [place] [address] [phone]       Follow up with Gwen Pounds, MD. (After D/CF from SNF)    Specialty:  Internal Medicine   Contact information:   [phone] [facility] [place]  [address] [phone]        Disposition: SNF Follow-up Appts: Follow-up with Dr. Timothy Lasso at [facility] 1-2 weeks post D/C from SNF.  Call for appointment.  Condition on Discharge: fair Tests Needing Follow-up: Needs to get to all appts.  Time spent in discharge (includes decision making & examination of pt): 50 min  Signed: Aashna Matson M 08/21/2013, 3:37 PM

## 2013-08-22 ENCOUNTER — Ambulatory Visit
Admission: RE | Admit: 2013-08-22 | Discharge: 2013-08-22 | Disposition: A | Discharge status: Home / Self Care | Payer: Medicare Other | Source: Ambulatory Visit | Attending: Radiation Oncology | Admitting: Radiation Oncology

## 2013-08-22 ENCOUNTER — Other Ambulatory Visit: Payer: Self-pay | Admitting: Gastroenterology

## 2013-08-22 ENCOUNTER — Encounter (HOSPITAL_COMMUNITY): Payer: Self-pay | Admitting: *Deleted

## 2013-08-23 ENCOUNTER — Ambulatory Visit (HOSPITAL_COMMUNITY): Admission: RE | Admit: 2013-08-23 | Payer: Medicare Other | Source: Ambulatory Visit | Admitting: Gastroenterology

## 2013-08-23 ENCOUNTER — Non-Acute Institutional Stay (SKILLED_NURSING_FACILITY): Payer: Medicare Other | Admitting: Adult Health

## 2013-08-23 ENCOUNTER — Encounter (HOSPITAL_COMMUNITY): Admission: RE | Payer: Self-pay | Source: Ambulatory Visit

## 2013-08-23 ENCOUNTER — Ambulatory Visit
Admission: RE | Admit: 2013-08-23 | Discharge: 2013-08-23 | Disposition: A | Discharge status: Home / Self Care | Payer: Medicare Other | Source: Ambulatory Visit | Attending: Radiation Oncology | Admitting: Radiation Oncology

## 2013-08-23 ENCOUNTER — Encounter: Payer: Self-pay | Admitting: Adult Health

## 2013-08-23 ENCOUNTER — Ambulatory Visit (HOSPITAL_COMMUNITY): Admit: 2013-08-23 | Payer: Self-pay | Admitting: Gastroenterology

## 2013-08-23 DIAGNOSIS — K5900 Constipation, unspecified: Secondary | ICD-10-CM

## 2013-08-23 DIAGNOSIS — K219 Gastro-esophageal reflux disease without esophagitis: Secondary | ICD-10-CM

## 2013-08-23 DIAGNOSIS — C259 Malignant neoplasm of pancreas, unspecified: Secondary | ICD-10-CM

## 2013-08-23 DIAGNOSIS — R109 Unspecified abdominal pain: Secondary | ICD-10-CM

## 2013-08-23 DIAGNOSIS — R630 Anorexia: Secondary | ICD-10-CM

## 2013-08-23 DIAGNOSIS — F419 Anxiety disorder, unspecified: Secondary | ICD-10-CM | POA: Insufficient documentation

## 2013-08-23 DIAGNOSIS — I10 Essential (primary) hypertension: Secondary | ICD-10-CM

## 2013-08-23 DIAGNOSIS — E43 Unspecified severe protein-calorie malnutrition: Secondary | ICD-10-CM

## 2013-08-23 DIAGNOSIS — F411 Generalized anxiety disorder: Secondary | ICD-10-CM

## 2013-08-23 DIAGNOSIS — K859 Acute pancreatitis without necrosis or infection, unspecified: Secondary | ICD-10-CM

## 2013-08-23 SURGERY — ESOPHAGEAL ENDOSCOPIC ULTRASOUND (EUS) RADIAL
Anesthesia: Monitor Anesthesia Care

## 2013-08-23 NOTE — Progress Notes (Signed)
Patient ID: Carolyn [name], female   DOB: 15-Oct-1935, 77 y.o.   MRN: [id number]     [facility]  Allergies  Allergen Reactions  . Other Other (See Comments)    Alka seltzer caused severe swelling     Chief Complaint  Patient presents with  . Hospitalization Follow-up    HPI: She has been hospitalized for her pancreatic cancer; pain management; nausea. Her peg tube placement has been placed on hold and she is due to follow up with Dr. Dulce Sellar in 2 weeks. She is receiving radiation therapy. Her chemotherapy (xeloda)  was stopped by Dr. Myna Hidalgo. Her biliary drain is open and does have high output at times which puts her high risk for dehydration. seh has required IVF in the recent past. She will need frequent bmp monitoring. She tells me that looking/smelling food makes her sick on her stomach and does not want to eat.   Past Medical History  Diagnosis Date  . Hypertension   . Hyperlipidemia   . GERD (gastroesophageal reflux disease)   . Asthma   . Obstructive jaundice 06/2013  . Pancreatitis   . Arthritis   . Cancer   . Heart murmur   . Thyroid disease   . Pancreatic cancer 07/05/13    pancreatic head=adenocarcinoma    Past Surgical History  Procedure Laterality Date  . Neck surgery  2005/2007  . Hand surgery  2010  . Cataract extraction    . Abdominal hysterectomy    . Eus N/A 07/05/2013    Procedure: ESOPHAGEAL ENDOSCOPIC ULTRASOUND (EUS) RADIAL;  Surgeon: Willis Modena, MD;  Location: [facility];  Service: Endoscopy;  Laterality: N/A;  . Ercp N/A 07/05/2013    Procedure: ENDOSCOPIC RETROGRADE CHOLANGIOPANCREATOGRAPHY (ERCP);  Surgeon: Willis Modena, MD;  Location: [facility];  Service: Endoscopy;  Laterality: N/A;  . Buttock cyst removal    . Cholecystectomy      VITAL SIGNS BP 142/70  Pulse 70  Ht 5\' 3"  (1.6 m)  Wt 183 lb (83.008 kg)  BMI 32.43 kg/m2   Patient's Medications  New Prescriptions   No medications on file  Previous Medications   ACETAMINOPHEN (TYLENOL) 325 MG TABLET    Take 650 mg by mouth every 6 (six) hours as needed for mild pain.   ALPRAZOLAM (XANAX) 0.25 MG TABLET    Take 0.25 mg by mouth every 8 (eight) hours as needed for anxiety or sleep.   AMLODIPINE (NORVASC) 5 MG TABLET    Take 5 mg by mouth every morning.   CHOLECALCIFEROL (VITAMIN D) 1000 UNITS TABLET    Take 1,000 Units by mouth daily.   DOCUSATE SODIUM (COLACE) 100 MG CAPSULE    Take 100 mg by mouth 2 (two) times daily.   DRONABINOL (MARINOL) 2.5 MG CAPSULE    Take 2.5 mg by mouth 2 (two) times daily before a meal.   ESOMEPRAZOLE (NEXIUM) 40 MG CAPSULE    Take 40 mg by mouth daily before breakfast.   FENTANYL (DURAGESIC - DOSED MCG/HR) 50 MCG/HR    Place 1 patch (50 mcg total) onto the skin every 3 (three) days.   HYDROCODONE-ACETAMINOPHEN (NORCO/VICODIN) 5-325 MG PER TABLET    Take 1 tablet by mouth every 6 (six) hours as needed.   HYDROCORTISONE CREAM 1 %    Apply topically 2 (two) times daily to hemorrhoids as needed.   LIPASE/PROTEASE/AMYLASE (CREON-12/PANCREASE) 12000 UNITS CPEP CAPSULE    Take 3 capsules by mouth 3 (three) times daily with meals.   METOCLOPRAMIDE (  REGLAN) 5 MG TABLET    Take 1 tablet (5 mg total) by mouth 3 (three) times daily before meals.   MIRTAZAPINE (REMERON) 30 MG TABLET    Take 1 tablet (30 mg total) by mouth at bedtime.   MULTIPLE VITAMIN (MULTIVITAMIN WITH MINERALS) TABS TABLET    Take 1 tablet by mouth daily.   POLYETHYLENE GLYCOL (MIRALAX / GLYCOLAX) PACKET    Take 17 g by mouth daily.   POTASSIUM CHLORIDE SA (K-DUR,KLOR-CON) 20 MEQ TABLET    Take 20 mEq by mouth daily.  Modified Medications   No medications on file  Discontinued Medications   No medications on file    SIGNIFICANT DIAGNOSTIC EXAMS  07-07-13: chest x-ray: No findings suspicious for metastatic disease in the chest. Minimal subpleural nodularity in the anterior right upper lobe measuring up to 4 mm, likely benign. Given high risk for primary bronchogenic  neoplasm, a single follow-up CT chest is suggested in 12 months. Trace right pleural effusion with associated mild dependent atelectasis.  07-20-13: upper GI: No acute finding. Presbyesophagus. Gastroesophageal reflux. Large stool burden.  07-24-13: chest x-ray: No active cardiopulmonary disease.  07-24-13: ct of abdomen: Biliary drainage catheter in satisfactory position with decompression of the bile ducts. Multiple liver lesions compatible with cysts. No evidence of pancreatitis. Pancreatic head mass not well seen on the current study due to artifact from barium and lack of intravenous contrast.  07-27-13: chest x-ray: Slightly increased central airway thickening/ peribronchial cuffing may represent viral respiratory infection, or an inflammatory process such as COPD or asthma.   08-14-13: ct of abdomen and pelvis: 1. Interval development of extensive intrahepatic and extrahepatic biliary ductal dilatation when compared to the recent prior examination, raising the possibility of percutaneous biliary drainage failure. Recommend correlation with laboratory analysis and possible ultrasound as clinically indicated as lack of intravenous contrast material somewhat limits evaluation. 2. Pancreatic head mass is not well delineated due to lack of intravenous contrast material. Persistent marked dilation of the pancreatic duct. 3. Probable multiple hepatic cysts. 4. Small right pleural effusion. 5. Distal thoracic aortic aneurysm.  LABS REVIEWED:   08-16-13: CA 10.9: <1.2 08-19-13: wbc 10.2; hgb 9.5; hct 30.0; mcv 85.7 plt 501; glucose 87; bun 9; creat 0.85; k+3.8;  na++ 137; alk phos 211;albumin 2.2 08-21-13: wbc 11.2; hgb 10.0; hct 32.0; mcv 84.7; plt 456; glucose 98; bun 9; creat 0.88; k+4.3; na++136; alk phos 191; albumin 2.4      Review of Systems  Constitutional: Positive for weight loss and malaise/fatigue.       Loss of 30 pounds in 2 months  Respiratory: Negative for cough and shortness  of breath.   Cardiovascular: Negative for chest pain and palpitations.  Gastrointestinal: Positive for abdominal pain. Negative for heartburn and constipation.       Has biliary drain  Musculoskeletal: Negative for joint pain and myalgias.  Skin: Negative.   Neurological: Negative for headaches.  Psychiatric/Behavioral: The patient is nervous/anxious. The patient does not have insomnia.      Physical Exam  Constitutional: She is oriented to person, place, and time. She appears well-developed and well-nourished. No distress.  Neck: Neck supple. No JVD present. No thyromegaly present.  Cardiovascular: Normal rate, regular rhythm and intact distal pulses.   Respiratory: Effort normal and breath sounds normal. No respiratory distress.  GI: Soft. Bowel sounds are normal.  Has biliary drain in place; has generalized tenderness present   Musculoskeletal: Normal range of motion. She exhibits no edema.  Neurological: She  is alert and oriented to person, place, and time.  Skin: Skin is warm and dry. She is not diaphoretic.  Psychiatric: She has a normal mood and affect.   ASSESSMENT/ PLAN:  1. Pancreatic cancer: will continue treatment per oncologist and will monitor her status; will place her marinol on hold at this time for one week; will begin zofran 4 mg prior to meals for her nausea; if this helps her nausea and she continues not to eat will then restart the marinol at that time. Will continue reglan 5 mg three times daily; will check bmp every week to monitor her closely for dehydration.    2. Pancreatitis: will continue creon 36,000 units with meals; will monitor   3. Protein calorie malnutrition: will continue supplements as directed;  Mvi;   4. Anorexia: will continue her remeron 30 mg nightly; will weight her three times weekly to monitor her weight; will hav her follow up in 2 weeks with Dr. Dulce Sellar for possible peg tube placement at that time  5. abdominal pain: she is getting  adequate pain relief with fentanyl 50 mcg patch every 3 days; and vicodin 5/325 mg every 6 hours as needed.   6. Genella Rife; will continue nexium 40 mg daily and reglan 5 mg three times daily  7. Hypertension: will continue norvasc 5 mg daily  8. Constipation: will continue colace twice daily; and miralax daily   9. Anxiety: will continue xanax 0.25 mg every 8 hours as needed.    Time spent with patient 50 minutes

## 2013-08-23 NOTE — Progress Notes (Signed)
Patient  Came to nursing after radtx want Biliary drain changed, also has an IVcather heplocked dated 08/20/16 in left forearm, was d/c from hospital on 08/21/13, wasnt d/c'd, patient is at [facility] on [address] per patient stated, d/c IV heplock, 2xw gause covered site and taped,, we informed her  We do not exchange biliary drains, it looks intact, called nursing home [phone], Alphia Moh called the nursing home clarify for patient, patient unhappy stating I haven't taken a tub bath , it's smells ,unable to get [facility] to answer phone, will have pateint talk with her nurse and will speak with Dr.moody tomorrow ask his advice 4:12 PM  4:11 PM

## 2013-08-24 ENCOUNTER — Ambulatory Visit
Admission: RE | Admit: 2013-08-24 | Discharge: 2013-08-24 | Disposition: A | Discharge status: Home / Self Care | Payer: Medicare Other | Source: Ambulatory Visit | Attending: Radiation Oncology | Admitting: Radiation Oncology

## 2013-08-25 ENCOUNTER — Ambulatory Visit
Admission: RE | Admit: 2013-08-25 | Discharge: 2013-08-25 | Disposition: A | Discharge status: Home / Self Care | Payer: Medicare Other | Source: Ambulatory Visit | Attending: Radiation Oncology | Admitting: Radiation Oncology

## 2013-08-25 ENCOUNTER — Ambulatory Visit (HOSPITAL_BASED_OUTPATIENT_CLINIC_OR_DEPARTMENT_OTHER): Payer: Medicare Other

## 2013-08-25 ENCOUNTER — Ambulatory Visit (HOSPITAL_BASED_OUTPATIENT_CLINIC_OR_DEPARTMENT_OTHER): Payer: Medicare Other | Admitting: Hematology & Oncology

## 2013-08-25 ENCOUNTER — Encounter: Payer: Self-pay | Admitting: Radiation Oncology

## 2013-08-25 ENCOUNTER — Other Ambulatory Visit (HOSPITAL_BASED_OUTPATIENT_CLINIC_OR_DEPARTMENT_OTHER): Payer: Medicare Other | Admitting: Lab

## 2013-08-25 VITALS — BP 171/74 | HR 103 | Temp 98.2°F | Resp 14 | Ht 63.0 in | Wt 183.0 lb

## 2013-08-25 VITALS — BP 150/68 | HR 98 | Temp 98.1°F | Resp 20 | Wt 185.6 lb

## 2013-08-25 DIAGNOSIS — F064 Anxiety disorder due to known physiological condition: Secondary | ICD-10-CM

## 2013-08-25 DIAGNOSIS — C259 Malignant neoplasm of pancreas, unspecified: Secondary | ICD-10-CM

## 2013-08-25 DIAGNOSIS — C250 Malignant neoplasm of head of pancreas: Secondary | ICD-10-CM

## 2013-08-25 LAB — CBC WITH DIFFERENTIAL (CANCER CENTER ONLY)
BASO%: 0.2 % (ref 0.0–2.0)
EOS%: 3.8 % (ref 0.0–7.0)
HCT: 34.2 % — ABNORMAL LOW (ref 34.8–46.6)
LYMPH%: 10.7 % — ABNORMAL LOW (ref 14.0–48.0)
MCH: 26.7 pg (ref 26.0–34.0)
MCHC: 31.9 g/dL — ABNORMAL LOW (ref 32.0–36.0)
MCV: 84 fL (ref 81–101)
MONO%: 11.7 % (ref 0.0–13.0)
NEUT%: 73.6 % (ref 39.6–80.0)
RDW: 15.7 % (ref 11.1–15.7)

## 2013-08-25 LAB — CMP (CANCER CENTER ONLY)
AST: 43 U/L — ABNORMAL HIGH (ref 11–38)
Albumin: 3.1 g/dL — ABNORMAL LOW (ref 3.3–5.5)
Alkaline Phosphatase: 133 U/L — ABNORMAL HIGH (ref 26–84)
BUN, Bld: 21 mg/dL (ref 7–22)
Glucose, Bld: 144 mg/dL — ABNORMAL HIGH (ref 73–118)
Potassium: 3.6 mEq/L (ref 3.3–4.7)
Total Bilirubin: 0.9 mg/dl (ref 0.20–1.60)
Total Protein: 8.7 g/dL — ABNORMAL HIGH (ref 6.4–8.1)

## 2013-08-25 LAB — PREALBUMIN: Prealbumin: 13 mg/dL — ABNORMAL LOW (ref 17.0–34.0)

## 2013-08-25 MED ORDER — MORPHINE SULFATE 4 MG/ML IJ SOLN
1.0000 mg | Freq: Once | INTRAMUSCULAR | Status: AC
  Administered 2013-08-25: 13:00:00 via INTRAVENOUS

## 2013-08-25 MED ORDER — FAMOTIDINE IN NACL 20-0.9 MG/50ML-% IV SOLN
40.0000 mg | Freq: Once | INTRAVENOUS | Status: AC
  Administered 2013-08-25: 40 mg via INTRAVENOUS

## 2013-08-25 MED ORDER — FAMOTIDINE IN NACL 20-0.9 MG/50ML-% IV SOLN
INTRAVENOUS | Status: AC
  Filled 2013-08-25: qty 100

## 2013-08-25 MED ORDER — MORPHINE SULFATE 4 MG/ML IJ SOLN
INTRAMUSCULAR | Status: AC
  Filled 2013-08-25: qty 1

## 2013-08-25 MED ORDER — SODIUM CHLORIDE 0.9 % IV SOLN
INTRAVENOUS | Status: DC
  Administered 2013-08-25: 12:00:00 via INTRAVENOUS

## 2013-08-25 NOTE — Patient Instructions (Signed)
Dehydration, Adult Dehydration is when you lose more fluids from the body than you take in. Vital organs like the kidneys, brain, and heart cannot function without a proper amount of fluids and salt. Any loss of fluids from the body can cause dehydration.  CAUSES   Vomiting.  Diarrhea.  Excessive sweating.  Excessive urine output.  Fever. SYMPTOMS  Mild dehydration  Thirst.  Dry lips.  Slightly dry mouth. Moderate dehydration  Very dry mouth.  Sunken eyes.  Skin does not bounce back quickly when lightly pinched and released.  Dark urine and decreased urine production.  Decreased tear production.  Headache. Severe dehydration  Very dry mouth.  Extreme thirst.  Rapid, weak pulse (more than 100 beats per minute at rest).  Cold hands and feet.  Not able to sweat in spite of heat and temperature.  Rapid breathing.  Blue lips.  Confusion and lethargy.  Difficulty being awakened.  Minimal urine production.  No tears. DIAGNOSIS  Your caregiver will diagnose dehydration based on your symptoms and your exam. Blood and urine tests will help confirm the diagnosis. The diagnostic evaluation should also identify the cause of dehydration. TREATMENT  Treatment of mild or moderate dehydration can often be done at home by increasing the amount of fluids that you drink. It is best to drink small amounts of fluid more often. Drinking too much at one time can make vomiting worse. Refer to the home care instructions below. Severe dehydration needs to be treated at the hospital where you will probably be given intravenous (IV) fluids that contain water and electrolytes. HOME CARE INSTRUCTIONS   Ask your caregiver about specific rehydration instructions.  Drink enough fluids to keep your urine clear or pale yellow.  Drink small amounts frequently if you have nausea and vomiting.  Eat as you normally do.  Avoid:  Foods or drinks high in sugar.  Carbonated  drinks.  Juice.  Extremely hot or cold fluids.  Drinks with caffeine.  Fatty, greasy foods.  Alcohol.  Tobacco.  Overeating.  Gelatin desserts.  Wash your hands well to avoid spreading bacteria and viruses.  Only take over-the-counter or prescription medicines for pain, discomfort, or fever as directed by your caregiver.  Ask your caregiver if you should continue all prescribed and over-the-counter medicines.  Keep all follow-up appointments with your caregiver. SEEK MEDICAL CARE IF:  You have abdominal pain and it increases or stays in one area (localizes).  You have a rash, stiff neck, or severe headache.  You are irritable, sleepy, or difficult to awaken.  You are weak, dizzy, or extremely thirsty. SEEK IMMEDIATE MEDICAL CARE IF:   You are unable to keep fluids down or you get worse despite treatment.  You have frequent episodes of vomiting or diarrhea.  You have blood or green matter (bile) in your vomit.  You have blood in your stool or your stool looks black and tarry.  You have not urinated in 6 to 8 hours, or you have only urinated a small amount of very dark urine.  You have a fever.  You faint. MAKE SURE YOU:   Understand these instructions.  Will watch your condition.  Will get help right away if you are not doing well or get worse. Document Released: 09/21/2005 Document Revised: 12/14/2011 Document Reviewed: 05/11/2011 ExitCare Patient Information 2014 [employer].  

## 2013-08-25 NOTE — Progress Notes (Signed)
Weekly rad txs, pancreas 18/ completed, labs today results in, changed dressing on biliary , old dressing dry, no drainage sen or noted, drain dressing placed under the tubing and abd dressing over that, patient tolerated well, stated she she was at Dr.Enneer office today for IVF's, patient also stated the biliary ne bag given her yesterday wasn't changed at [facility] didn't exchange it,I still smell", patient unhappy at being there,  , veg soup today stayed down, tried to eat french toast couldn't keep it down, I don't like that ensutre it comes back up<I'm going to get some boost, I can keep pnut butter and saltines ,will give her some to take 4:29 PM

## 2013-08-25 NOTE — Progress Notes (Signed)
Department of Radiation Oncology  Phone:  [phone] Fax:        [phone]  Weekly Treatment Note    Name: Carolyn [name] Date: 08/25/2013 MRN: [id number] DOB: 1936/07/07   Current dose: 34.2 Gy  Current fraction: 18   MEDICATIONS: Current Outpatient Prescriptions  Medication Sig Dispense Refill  . acetaminophen (TYLENOL) 325 MG tablet Take 650 mg by mouth every 6 (six) hours as needed for mild pain.      Marland Kitchen ALPRAZolam (XANAX) 0.25 MG tablet Take 0.25 mg by mouth every 8 (eight) hours as needed for anxiety or sleep.      Marland Kitchen amLODipine (NORVASC) 5 MG tablet Take 5 mg by mouth every morning.      . cholecalciferol (VITAMIN D) 1000 UNITS tablet Take 1,000 Units by mouth daily.      Marland Kitchen docusate sodium (COLACE) 100 MG capsule Take 100 mg by mouth 2 (two) times daily.      Marland Kitchen dronabinol (MARINOL) 2.5 MG capsule Take 2.5 mg by mouth 2 (two) times daily before a meal.      . esomeprazole (NEXIUM) 40 MG capsule Take 40 mg by mouth daily before breakfast.      . fentaNYL (DURAGESIC - DOSED MCG/HR) 50 MCG/HR Place 1 patch (50 mcg total) onto the skin every 3 (three) days.  10 patch  0  . HYDROcodone-acetaminophen (NORCO/VICODIN) 5-325 MG per tablet Take 1 tablet by mouth every 6 (six) hours as needed.  30 tablet  0  . hydrocortisone cream 1 % Apply topically 2 (two) times daily to hemorrhoids as needed.  30 g  0  . lipase/protease/amylase (CREON-12/PANCREASE) 12000 UNITS CPEP capsule Take 3 capsules by mouth 3 (three) times daily with meals.      . metoCLOPramide (REGLAN) 5 MG tablet Take 1 tablet (5 mg total) by mouth 3 (three) times daily before meals.      . mirtazapine (REMERON) 30 MG tablet Take 1 tablet (30 mg total) by mouth at bedtime.      . Multiple Vitamin (MULTIVITAMIN WITH MINERALS) TABS tablet Take 1 tablet by mouth daily.      . Ondansetron 8 MG FILM Take by mouth as needed.      . polyethylene glycol (MIRALAX / GLYCOLAX) packet Take 17 g by mouth daily.  14 each  0  .  potassium chloride SA (K-DUR,KLOR-CON) 20 MEQ tablet Take 20 mEq by mouth daily.       No current facility-administered medications for this encounter.     ALLERGIES: Other   LABORATORY DATA:  Lab Results  Component Value Date   WBC 12.5* 08/25/2013   HGB 10.9* 08/25/2013   HCT 34.2* 08/25/2013   MCV 84 08/25/2013   PLT 357 08/25/2013   Lab Results  Component Value Date   NA 134 08/25/2013   K 3.6 08/25/2013   CL 103 08/25/2013   CO2 23 08/25/2013   Lab Results  Component Value Date   ALT 34 08/25/2013   AST 43* 08/25/2013   ALKPHOS 133* 08/25/2013   BILITOT 0.90 08/25/2013     NARRATIVE: DARLIS WRAGG was seen today for weekly treatment management. The chart was checked and the patient's films were reviewed. The patient is states that she is doing relatively well with her radiation treatment. She denies any significant nausea or diarrhea today. No worsening abdominal pain.  PHYSICAL EXAMINATION: weight is 185 lb 9.6 oz (84.188 kg). Her oral temperature is 98.1 F (36.7 C). Her blood pressure  is 150/68 and her pulse is 98. Her respiration is 20.        ASSESSMENT: The patient is doing satisfactorily with treatment.  PLAN: We will continue with the patient's radiation treatment as planned.

## 2013-08-27 ENCOUNTER — Ambulatory Visit
Admission: RE | Admit: 2013-08-27 | Discharge: 2013-08-27 | Disposition: A | Discharge status: Home / Self Care | Payer: Medicare Other | Source: Ambulatory Visit | Attending: Radiation Oncology | Admitting: Radiation Oncology

## 2013-08-27 NOTE — Progress Notes (Signed)
This office note has been dictated.

## 2013-08-28 ENCOUNTER — Telehealth: Payer: Self-pay | Admitting: Hematology & Oncology

## 2013-08-28 ENCOUNTER — Ambulatory Visit
Admission: RE | Admit: 2013-08-28 | Discharge: 2013-08-28 | Disposition: A | Discharge status: Home / Self Care | Payer: Medicare Other | Source: Ambulatory Visit | Attending: Radiation Oncology | Admitting: Radiation Oncology

## 2013-08-28 NOTE — Telephone Encounter (Signed)
[name] at Nursing Home aware of pt's 12-8 appointment

## 2013-08-28 NOTE — Telephone Encounter (Signed)
Husband aware of 12-8 appointment. I called nursing home also, Tonya answered she would not take appointment said I needed to talk to [name] who is in a meeting. She took down all the info and said would give [name] message to call me.

## 2013-08-29 ENCOUNTER — Ambulatory Visit
Admission: RE | Admit: 2013-08-29 | Discharge: 2013-08-29 | Disposition: A | Discharge status: Home / Self Care | Payer: Medicare Other | Source: Ambulatory Visit | Attending: Radiation Oncology | Admitting: Radiation Oncology

## 2013-08-29 ENCOUNTER — Encounter: Payer: Self-pay | Admitting: Internal Medicine

## 2013-08-29 ENCOUNTER — Non-Acute Institutional Stay (SKILLED_NURSING_FACILITY): Payer: Medicare Other | Admitting: Internal Medicine

## 2013-08-29 DIAGNOSIS — E43 Unspecified severe protein-calorie malnutrition: Secondary | ICD-10-CM

## 2013-08-29 DIAGNOSIS — K859 Acute pancreatitis without necrosis or infection, unspecified: Secondary | ICD-10-CM

## 2013-08-29 DIAGNOSIS — C259 Malignant neoplasm of pancreas, unspecified: Secondary | ICD-10-CM

## 2013-08-29 DIAGNOSIS — I10 Essential (primary) hypertension: Secondary | ICD-10-CM

## 2013-08-29 DIAGNOSIS — R109 Unspecified abdominal pain: Secondary | ICD-10-CM

## 2013-08-29 DIAGNOSIS — R630 Anorexia: Secondary | ICD-10-CM

## 2013-08-29 DIAGNOSIS — F411 Generalized anxiety disorder: Secondary | ICD-10-CM

## 2013-08-29 DIAGNOSIS — F419 Anxiety disorder, unspecified: Secondary | ICD-10-CM

## 2013-08-29 DIAGNOSIS — K5900 Constipation, unspecified: Secondary | ICD-10-CM

## 2013-08-29 DIAGNOSIS — K219 Gastro-esophageal reflux disease without esophagitis: Secondary | ICD-10-CM

## 2013-08-29 NOTE — Progress Notes (Signed)
Patient ID: Carolyn [name], female   DOB: 06-04-1936, 77 y.o.   MRN: [id number]    Deanglo Hissong L. Bertrand Vowels, D.O., C.M.D. [facility]  Allergies  Allergen Reactions  . Other Other (See Comments)    Alka seltzer caused severe swelling    Chief Complaint  Patient presents with  . Hospitalization Follow-up    new admission s/p hospitalization for poor po intake and increased pain in context of pancreatic cancer--had been on xeloda but stopped due to nausea, increased pain, and poor po intake;  continues on XRT with hope to shrink tumor and gain strength for Whipple procedure;  goal at present is comfort over all    HPI: 77 yo female with localized pancreatic cancer but at a poor location.  She has been hospitalized pain and nausea mgt. Her peg tube placement has been placed on hold and she is due to follow up with Dr. Dulce Sellar in 2 weeks. She is receiving radiation therapy. Her chemotherapy (xeloda)  was stopped by Dr. Myna Hidalgo. Her biliary drain is open and does have high output at times which puts her high risk for dehydration. She has required IVF during the hospitalization. She will need frequent bmp monitoring. She tells me that looking/smelling food makes her sick on her stomach and does not want to eat.   She also says she is continuously nauseated.  She was quite tearful during my visit today saying that no one else understands what she is going through.  She finds therapy very difficult.    Later in the day, I learned that her daughter planned to take FMLA time to help take care of her.  She also lives with her husband.    Past Medical History  Diagnosis Date  . Hypertension   . Hyperlipidemia   . GERD (gastroesophageal reflux disease)   . Asthma   . Obstructive jaundice 06/2013  . Pancreatitis   . Arthritis   . Cancer   . Heart murmur   . Thyroid disease   . Pancreatic cancer 07/05/13    pancreatic head=adenocarcinoma    Past Surgical History  Procedure Laterality Date    . Neck surgery  2005/2007  . Hand surgery  2010  . Cataract extraction    . Abdominal hysterectomy    . Eus N/A 07/05/2013    Procedure: ESOPHAGEAL ENDOSCOPIC ULTRASOUND (EUS) RADIAL;  Surgeon: Willis Modena, MD;  Location: [facility];  Service: Endoscopy;  Laterality: N/A;  . Ercp N/A 07/05/2013    Procedure: ENDOSCOPIC RETROGRADE CHOLANGIOPANCREATOGRAPHY (ERCP);  Surgeon: Willis Modena, MD;  Location: [facility];  Service: Endoscopy;  Laterality: N/A;  . Buttock cyst removal    . Cholecystectomy      VITAL SIGNS BP 134/76  Pulse 68  Temp(Src) 96.8 F (36 C)  Resp 20  Ht 5\' 3"  (1.6 m)  Wt 184 lb (83.462 kg)  BMI 32.60 kg/m2   Patient's Medications  New Prescriptions   No medications on file  Previous Medications   ACETAMINOPHEN (TYLENOL) 325 MG TABLET    Take 650 mg by mouth every 6 (six) hours as needed for mild pain.   ALPRAZOLAM (XANAX) 0.25 MG TABLET    Take 0.25 mg by mouth every 8 (eight) hours as needed for anxiety or sleep.   AMLODIPINE (NORVASC) 5 MG TABLET    Take 5 mg by mouth every morning.   CHOLECALCIFEROL (VITAMIN D) 1000 UNITS TABLET    Take 1,000 Units by mouth daily.   DOCUSATE SODIUM (COLACE)  100 MG CAPSULE    Take 100 mg by mouth 2 (two) times daily.   DRONABINOL (MARINOL) 2.5 MG CAPSULE    Take 2.5 mg by mouth 2 (two) times daily before a meal.   ESOMEPRAZOLE (NEXIUM) 40 MG CAPSULE    Take 40 mg by mouth daily before breakfast.   FENTANYL (DURAGESIC - DOSED MCG/HR) 50 MCG/HR    Place 1 patch (50 mcg total) onto the skin every 3 (three) days.   HYDROCODONE-ACETAMINOPHEN (NORCO/VICODIN) 5-325 MG PER TABLET    Take 1 tablet by mouth every 6 (six) hours as needed.   HYDROCORTISONE CREAM 1 %    Apply topically 2 (two) times daily to hemorrhoids as needed.   LIPASE/PROTEASE/AMYLASE (CREON-12/PANCREASE) 12000 UNITS CPEP CAPSULE    Take 3 capsules by mouth 3 (three) times daily with meals.   METOCLOPRAMIDE (REGLAN) 5 MG TABLET    Take 1 tablet (5 mg total) by  mouth 3 (three) times daily before meals.   MIRTAZAPINE (REMERON) 30 MG TABLET    Take 1 tablet (30 mg total) by mouth at bedtime.   MULTIPLE VITAMIN (MULTIVITAMIN WITH MINERALS) TABS TABLET    Take 1 tablet by mouth daily.   ONDANSETRON 8 MG FILM    Take by mouth as needed.   POLYETHYLENE GLYCOL (MIRALAX / GLYCOLAX) PACKET    Take 17 g by mouth daily.   POTASSIUM CHLORIDE SA (K-DUR,KLOR-CON) 20 MEQ TABLET    Take 20 mEq by mouth daily.  Modified Medications   No medications on file  Discontinued Medications   No medications on file    SIGNIFICANT DIAGNOSTIC EXAMS  07-07-13: chest x-ray: No findings suspicious for metastatic disease in the chest. Minimal subpleural nodularity in the anterior right upper lobe measuring up to 4 mm, likely benign. Given high risk for primary bronchogenic neoplasm, a single follow-up CT chest is suggested in 12 months. Trace right pleural effusion with associated mild dependent atelectasis.  07-20-13: upper GI: No acute finding. Presbyesophagus. Gastroesophageal reflux. Large stool burden.  07-24-13: chest x-ray: No active cardiopulmonary disease.  07-24-13: ct of abdomen: Biliary drainage catheter in satisfactory position with decompression of the bile ducts. Multiple liver lesions compatible with cysts. No evidence of pancreatitis. Pancreatic head mass not well seen on the current study due to artifact from barium and lack of intravenous contrast.  07-27-13: chest x-ray: Slightly increased central airway thickening/ peribronchial cuffing may represent viral respiratory infection, or an inflammatory process such as COPD or asthma.   08-14-13: ct of abdomen and pelvis: 1. Interval development of extensive intrahepatic and extrahepatic biliary ductal dilatation when compared to the recent prior examination, raising the possibility of percutaneous biliary drainage failure. Recommend correlation with laboratory analysis and possible ultrasound as clinically  indicated as lack of intravenous contrast material somewhat limits evaluation. 2. Pancreatic head mass is not well delineated due to lack of intravenous contrast material. Persistent marked dilation of the pancreatic duct. 3. Probable multiple hepatic cysts. 4. Small right pleural effusion. 5. Distal thoracic aortic aneurysm.  LABS REVIEWED:   08-16-13: CA 10.9: <1.2 08-19-13: wbc 10.2; hgb 9.5; hct 30.0; mcv 85.7 plt 501; glucose 87; bun 9; creat 0.85; k+3.8;  na++ 137; alk phos 211;albumin 2.2 08-21-13: wbc 11.2; hgb 10.0; hct 32.0; mcv 84.7; plt 456; glucose 98; bun 9; creat 0.88; k+4.3; na++136; alk phos 191; albumin 2.4      Review of Systems  Constitutional: Positive for weight loss and malaise/fatigue. Negative for fever.  Loss of 30 pounds in 2 months  Respiratory: Negative for cough and shortness of breath.   Cardiovascular: Negative for chest pain and palpitations.  Gastrointestinal: Positive for abdominal pain. Negative for heartburn and constipation.       Has biliary drain that she thinks has a foul odor but I could not smell anything  Musculoskeletal: Negative for joint pain and myalgias.  Skin: Negative.   Neurological: Positive for weakness. Negative for headaches.  Psychiatric/Behavioral: Positive for depression. The patient is nervous/anxious. The patient does not have insomnia.        Tearful during visit and having difficulty coping with her diagnosis--wants to still be active and working     Physical Exam  Constitutional: She is oriented to person, place, and time. She appears well-developed and well-nourished. No distress.  Neck: Neck supple. No JVD present. No thyromegaly present.  Cardiovascular: Normal rate, regular rhythm and intact distal pulses.   Respiratory: Effort normal and breath sounds normal. No respiratory distress.  GI: Soft. Bowel sounds are normal.  Has biliary drain in place; has generalized tenderness present   Musculoskeletal: Normal  range of motion. She exhibits no edema.  Neurological: She is alert and oriented to person, place, and time.  Skin: Skin is warm and dry. She is not diaphoretic.  Psychiatric: She has a normal mood and affect.  tearful   ASSESSMENT/ PLAN:  1. Pancreatic cancer: will continue treatment per oncologist including xrt; cont marinol on hold for rest of the week; cont zofran 4 mg prior to meals for her nausea; continue reglan 5 mg three times daily; will check bmp every week to monitor her closely for dehydration due to biliary drain.   -wants to go home with daughter so orders were written to accomodate this  2. Chronic Pancreatitis: will continue creon 36,000 units with meals  3. Protein calorie malnutrition: will continue supplements as directed,  mvi  4. Anorexia: will continue her remeron 30 mg nightly, nausea therapy, will weight her three times weekly to monitor her weight Keep f/u with  Dr. Dulce Sellar for possible peg tube placement at that time  5. Abdominal pain: she is getting adequate pain relief with fentanyl 50 mcg patch every 3 days and vicodin 5/325 mg every 6 hours as needed.   6. Genella Rife: will continue nexium 40 mg daily and reglan 5 mg three times daily  7. Hypertension: will continue norvasc 5 mg daily  8. Constipation: will continue colace twice daily and miralax daily   9. Anxiety: will continue xanax 0.5 mg every 8 hours as needed. The remeron should also help her mood.

## 2013-08-30 ENCOUNTER — Ambulatory Visit
Admission: RE | Admit: 2013-08-30 | Discharge: 2013-08-30 | Disposition: A | Discharge status: Home / Self Care | Payer: Medicare Other | Source: Ambulatory Visit | Attending: Radiation Oncology | Admitting: Radiation Oncology

## 2013-09-04 ENCOUNTER — Inpatient Hospital Stay (HOSPITAL_COMMUNITY)
Admission: EM | Admit: 2013-09-04 | Discharge: 2013-09-09 | DRG: 435 | Disposition: A | Discharge status: Hospice | Payer: Medicare Other | Attending: Internal Medicine | Admitting: Internal Medicine

## 2013-09-04 ENCOUNTER — Emergency Department (HOSPITAL_COMMUNITY): Payer: Medicare Other

## 2013-09-04 ENCOUNTER — Encounter (HOSPITAL_COMMUNITY): Payer: Self-pay | Admitting: Emergency Medicine

## 2013-09-04 ENCOUNTER — Other Ambulatory Visit: Payer: Self-pay

## 2013-09-04 ENCOUNTER — Ambulatory Visit
Admission: RE | Admit: 2013-09-04 | Discharge: 2013-09-04 | Disposition: A | Discharge status: Home / Self Care | Payer: Medicare Other | Source: Ambulatory Visit | Attending: Radiation Oncology | Admitting: Radiation Oncology

## 2013-09-04 ENCOUNTER — Encounter: Payer: Self-pay | Admitting: *Deleted

## 2013-09-04 DIAGNOSIS — R55 Syncope and collapse: Secondary | ICD-10-CM | POA: Diagnosis present

## 2013-09-04 DIAGNOSIS — Z66 Do not resuscitate: Secondary | ICD-10-CM | POA: Diagnosis present

## 2013-09-04 DIAGNOSIS — E871 Hypo-osmolality and hyponatremia: Secondary | ICD-10-CM | POA: Diagnosis present

## 2013-09-04 DIAGNOSIS — R109 Unspecified abdominal pain: Secondary | ICD-10-CM

## 2013-09-04 DIAGNOSIS — F419 Anxiety disorder, unspecified: Secondary | ICD-10-CM

## 2013-09-04 DIAGNOSIS — K838 Other specified diseases of biliary tract: Secondary | ICD-10-CM | POA: Diagnosis present

## 2013-09-04 DIAGNOSIS — Z515 Encounter for palliative care: Secondary | ICD-10-CM

## 2013-09-04 DIAGNOSIS — Z9221 Personal history of antineoplastic chemotherapy: Secondary | ICD-10-CM

## 2013-09-04 DIAGNOSIS — N289 Disorder of kidney and ureter, unspecified: Secondary | ICD-10-CM

## 2013-09-04 DIAGNOSIS — Z9849 Cataract extraction status, unspecified eye: Secondary | ICD-10-CM

## 2013-09-04 DIAGNOSIS — K8689 Other specified diseases of pancreas: Secondary | ICD-10-CM

## 2013-09-04 DIAGNOSIS — E042 Nontoxic multinodular goiter: Secondary | ICD-10-CM | POA: Diagnosis present

## 2013-09-04 DIAGNOSIS — M1990 Unspecified osteoarthritis, unspecified site: Secondary | ICD-10-CM | POA: Diagnosis present

## 2013-09-04 DIAGNOSIS — K219 Gastro-esophageal reflux disease without esophagitis: Secondary | ICD-10-CM | POA: Diagnosis present

## 2013-09-04 DIAGNOSIS — W19XXXA Unspecified fall, initial encounter: Secondary | ICD-10-CM | POA: Diagnosis present

## 2013-09-04 DIAGNOSIS — Z6832 Body mass index (BMI) 32.0-32.9, adult: Secondary | ICD-10-CM

## 2013-09-04 DIAGNOSIS — R110 Nausea: Secondary | ICD-10-CM | POA: Diagnosis present

## 2013-09-04 DIAGNOSIS — R42 Dizziness and giddiness: Secondary | ICD-10-CM | POA: Diagnosis present

## 2013-09-04 DIAGNOSIS — D649 Anemia, unspecified: Secondary | ICD-10-CM | POA: Diagnosis present

## 2013-09-04 DIAGNOSIS — E46 Unspecified protein-calorie malnutrition: Secondary | ICD-10-CM | POA: Diagnosis present

## 2013-09-04 DIAGNOSIS — I959 Hypotension, unspecified: Secondary | ICD-10-CM | POA: Diagnosis present

## 2013-09-04 DIAGNOSIS — F411 Generalized anxiety disorder: Secondary | ICD-10-CM

## 2013-09-04 DIAGNOSIS — D62 Acute posthemorrhagic anemia: Secondary | ICD-10-CM | POA: Diagnosis present

## 2013-09-04 DIAGNOSIS — Z87891 Personal history of nicotine dependence: Secondary | ICD-10-CM

## 2013-09-04 DIAGNOSIS — R630 Anorexia: Secondary | ICD-10-CM | POA: Diagnosis present

## 2013-09-04 DIAGNOSIS — E785 Hyperlipidemia, unspecified: Secondary | ICD-10-CM | POA: Diagnosis present

## 2013-09-04 DIAGNOSIS — I10 Essential (primary) hypertension: Secondary | ICD-10-CM | POA: Diagnosis present

## 2013-09-04 DIAGNOSIS — N179 Acute kidney failure, unspecified: Secondary | ICD-10-CM | POA: Diagnosis present

## 2013-09-04 DIAGNOSIS — E43 Unspecified severe protein-calorie malnutrition: Secondary | ICD-10-CM | POA: Diagnosis present

## 2013-09-04 DIAGNOSIS — K5900 Constipation, unspecified: Secondary | ICD-10-CM | POA: Diagnosis present

## 2013-09-04 DIAGNOSIS — K831 Obstruction of bile duct: Secondary | ICD-10-CM

## 2013-09-04 DIAGNOSIS — C250 Malignant neoplasm of head of pancreas: Secondary | ICD-10-CM

## 2013-09-04 DIAGNOSIS — Y9210 Unspecified residential institution as the place of occurrence of the external cause: Secondary | ICD-10-CM | POA: Diagnosis present

## 2013-09-04 DIAGNOSIS — E860 Dehydration: Secondary | ICD-10-CM | POA: Diagnosis present

## 2013-09-04 DIAGNOSIS — R5381 Other malaise: Secondary | ICD-10-CM | POA: Diagnosis present

## 2013-09-04 LAB — BASIC METABOLIC PANEL
BUN: 54 mg/dL — ABNORMAL HIGH (ref 6–23)
CO2: 19 mEq/L (ref 19–32)
Chloride: 91 mEq/L — ABNORMAL LOW (ref 96–112)
Creatinine, Ser: 2.21 mg/dL — ABNORMAL HIGH (ref 0.50–1.10)
GFR calc Af Amer: 24 mL/min — ABNORMAL LOW (ref >90)
Potassium: 3.4 mEq/L — ABNORMAL LOW (ref 3.5–5.1)
Sodium: 126 mEq/L — ABNORMAL LOW (ref 135–145)

## 2013-09-04 LAB — URINALYSIS, ROUTINE W REFLEX MICROSCOPIC
Glucose, UA: NEGATIVE mg/dL
Ketones, ur: NEGATIVE mg/dL
Leukocytes, UA: NEGATIVE
Nitrite: NEGATIVE
Protein, ur: 100 mg/dL — AB
pH: 5.5 (ref 5.0–8.0)

## 2013-09-04 LAB — URINE MICROSCOPIC-ADD ON

## 2013-09-04 LAB — TROPONIN I: Troponin I: <0.3 ng/mL (ref <0.30)

## 2013-09-04 LAB — CBC
Hemoglobin: 12.8 g/dL (ref 12.0–15.0)
Platelets: 400 10*3/uL (ref 150–400)
RBC: 4.62 MIL/uL (ref 3.87–5.11)
WBC: 15.9 10*3/uL — ABNORMAL HIGH (ref 4.0–10.5)

## 2013-09-04 LAB — HEPATIC FUNCTION PANEL

## 2013-09-04 MED ORDER — SODIUM CHLORIDE 0.9 % IV SOLN: INTRAVENOUS | Status: DC

## 2013-09-04 MED ORDER — SODIUM CHLORIDE 0.9 % IV BOLUS (SEPSIS)
1000.0000 mL | Freq: Once | INTRAVENOUS | Status: AC
  Administered 2013-09-04: 1000 mL via INTRAVENOUS

## 2013-09-04 MED ORDER — ONDANSETRON HCL 4 MG/2ML IJ SOLN: 4.0000 mg | Freq: Three times a day (TID) | INTRAMUSCULAR | Status: DC | PRN

## 2013-09-04 NOTE — ED Provider Notes (Signed)
CSN: [id number]     Arrival date & time 09/04/13  1548 History   First MD Initiated Contact with Patient 09/04/13 1613     Chief Complaint  Patient presents with  . Fall  . Dizziness   (Consider location/radiation/quality/duration/timing/severity/associated sxs/prior Treatment) HPI Comments: 77 year old female with history of pancreatic cancer who presented for her radiation treatment and fell as she was being registered. She reports feeling lightheaded and weak prior to falling back and hitting the back of her head. She denies syncope or loss of consciousness.  Patient is a 77 y.o. female presenting with fall.  Fall This is a new problem. The current episode started less than 1 hour ago. Episode frequency: once. The problem has been resolved. Pertinent negatives include no chest pain, no abdominal pain and no shortness of breath. Associated symptoms comments: Dizziness, weakness. Nothing aggravates the symptoms. Nothing relieves the symptoms.    Past Medical History  Diagnosis Date  . Hypertension   . Hyperlipidemia   . GERD (gastroesophageal reflux disease)   . Asthma   . Obstructive jaundice 06/2013  . Pancreatitis   . Arthritis   . Cancer   . Heart murmur   . Thyroid disease   . Pancreatic cancer 07/05/13    pancreatic head=adenocarcinoma   Past Surgical History  Procedure Laterality Date  . Neck surgery  2005/2007  . Hand surgery  2010  . Cataract extraction    . Abdominal hysterectomy    . Eus N/A 07/05/2013    Procedure: ESOPHAGEAL ENDOSCOPIC ULTRASOUND (EUS) RADIAL;  Surgeon: Willis Modena, MD;  Location: [facility];  Service: Endoscopy;  Laterality: N/A;  . Ercp N/A 07/05/2013    Procedure: ENDOSCOPIC RETROGRADE CHOLANGIOPANCREATOGRAPHY (ERCP);  Surgeon: Willis Modena, MD;  Location: [facility];  Service: Endoscopy;  Laterality: N/A;  . Buttock cyst removal    . Cholecystectomy     Family History  Problem Relation Age of Onset  . Cancer Daughter     Breast    History  Substance Use Topics  . Smoking status: Former Smoker -- 1.00 packs/day for 20 years    Types: Cigarettes    Quit date: 06/29/2013  . Smokeless tobacco: Never Used     Comment: PACK A WEEK  . Alcohol Use: 0.6 oz/week    1 Glasses of wine per week     Comment: SOMETIMES   OB History   Grav Para Term Preterm Abortions TAB SAB Ect Mult Living                 Review of Systems  Constitutional: Negative for fever.  HENT: Negative for congestion.   Respiratory: Negative for cough and shortness of breath.   Cardiovascular: Negative for chest pain.  Gastrointestinal: Negative for nausea, vomiting, abdominal pain and diarrhea.  All other systems reviewed and are negative.    Allergies  Other  Home Medications   Current Outpatient Rx  Name  Route  Sig  Dispense  Refill  . acetaminophen (TYLENOL) 325 MG tablet   Oral   Take 650 mg by mouth every 6 (six) hours as needed for mild pain.         Marland Kitchen ALPRAZolam (XANAX) 0.25 MG tablet   Oral   Take 0.25 mg by mouth every 8 (eight) hours as needed for anxiety or sleep.         . cholecalciferol (VITAMIN D) 1000 UNITS tablet   Oral   Take 1,000 Units by mouth daily.         Marland Kitchen  dronabinol (MARINOL) 2.5 MG capsule   Oral   Take 2.5 mg by mouth 2 (two) times daily before a meal.         . esomeprazole (NEXIUM) 40 MG capsule   Oral   Take 40 mg by mouth daily before breakfast.         . fentaNYL (DURAGESIC - DOSED MCG/HR) 50 MCG/HR   Transdermal   Place 1 patch (50 mcg total) onto the skin every 3 (three) days.   10 patch   0   . lipase/protease/amylase (CREON-12/PANCREASE) 12000 UNITS CPEP capsule   Oral   Take 3 capsules by mouth 3 (three) times daily with meals.         . metoCLOPramide (REGLAN) 5 MG tablet   Oral   Take 1 tablet (5 mg total) by mouth 3 (three) times daily before meals.         . ondansetron (ZOFRAN) 8 MG tablet   Oral   Take by mouth every 8 (eight) hours as needed for nausea  or vomiting (nausea).         . potassium chloride SA (K-DUR,KLOR-CON) 20 MEQ tablet   Oral   Take 20 mEq by mouth daily.         . verapamil (CALAN) 40 MG tablet   Oral   Take 40 mg by mouth once.          BP 157/61  Pulse 108  Temp(Src) 98 F (36.7 C) (Oral)  Resp 16  SpO2 99% Physical Exam  Nursing note and vitals reviewed. Constitutional: She is oriented to person, place, and time. She appears well-developed and well-nourished. No distress.  HENT:  Head: Normocephalic and atraumatic.  Mouth/Throat: Oropharynx is clear and moist.  Eyes: Conjunctivae are normal. Pupils are equal, round, and reactive to light. No scleral icterus.  Neck: Neck supple.  Cardiovascular: Normal rate, regular rhythm, normal heart sounds and intact distal pulses.   No murmur heard. Pulmonary/Chest: Effort normal and breath sounds normal. No stridor. No respiratory distress. She has no rales.  Abdominal: Soft. Bowel sounds are normal. She exhibits no distension. There is no tenderness.  Musculoskeletal: Normal range of motion.  Neurological: She is alert and oriented to person, place, and time. She has normal strength. No cranial nerve deficit or sensory deficit. Coordination and gait (able to ambulate slowly without ataxia) normal. GCS eye subscore is 4. GCS verbal subscore is 5. GCS motor subscore is 6.  Skin: Skin is warm and dry. No rash noted.  Psychiatric: She has a normal mood and affect. Her behavior is normal.    ED Course  Procedures (including critical care time) Labs Review Labs Reviewed  URINALYSIS, ROUTINE W REFLEX MICROSCOPIC - Abnormal; Notable for the following:    Color, Urine AMBER (*)    APPearance CLOUDY (*)    Bilirubin Urine SMALL (*)    Protein, ur 100 (*)    All other components within normal limits  CBC - Abnormal; Notable for the following:    WBC 15.9 (*)    All other components within normal limits  BASIC METABOLIC PANEL - Abnormal; Notable for the  following:    Sodium 126 (*)    Potassium 3.4 (*)    Chloride 91 (*)    Glucose, Bld 138 (*)    BUN 54 (*)    Creatinine, Ser 2.21 (*)    Calcium 10.8 (*)    GFR calc non Af Amer 21 (*)    GFR  calc Af Amer 24 (*)    All other components within normal limits  URINE MICROSCOPIC-ADD ON - Abnormal; Notable for the following:    Squamous Epithelial / LPF MANY (*)    Bacteria, UA FEW (*)    Casts HYALINE CASTS (*)    All other components within normal limits  CBC  BASIC METABOLIC PANEL  TROPONIN I  HEPATIC FUNCTION PANEL  TROPONIN I   Imaging Review Ct Head Wo Contrast  09/04/2013   CLINICAL DATA:  Posterior head and neck pain after falling.  EXAM: CT HEAD WITHOUT CONTRAST  CT CERVICAL SPINE WITHOUT CONTRAST  TECHNIQUE: Multidetector CT imaging of the head and cervical spine was performed following the standard protocol without intravenous contrast. Multiplanar CT image reconstructions of the cervical spine were also generated.  COMPARISON:  Cervical spine radiographs 05/13/2006. Chest CT 07/07/2013.  FINDINGS: CT HEAD FINDINGS  There is no evidence of acute intracranial hemorrhage, mass lesion, brain edema or extra-axial fluid collection. The ventricles and subarachnoid spaces are appropriately sized for age. There is no CT evidence of acute cortical infarction.  The visualized paranasal sinuses, mastoid air cells and middle ears are clear. The calvarium is intact.  CT CERVICAL SPINE FINDINGS  The alignment is stable. There is no evidence acute fracture or traumatic subluxation. There are multilevel surgical changes status post C3 through C7 posterior decompression and fusion. There is an anterior plate and screws at C4-5 and bilateral pedicle screws from C3 through C7. No hardware loosening is identified. The interbody fusion appears solid. There is disc degeneration with endplate degeneration at C7-T1.  Heterogeneous enlargement of the thyroid gland appears grossly stable. There are scattered  vascular calcifications. No acute soft tissue findings are evident.  IMPRESSION: 1. No acute intracranial or calvarial findings. 2. No evidence of acute cervical spine fracture, traumatic subluxation or static signs of instability. 3. Grossly stable appearance of the cervical spine status post laminectomy and fusion. No hardware loosening identified.   Electronically Signed   By: Roxy Horseman M.D.   On: 09/04/2013 17:25   Ct Cervical Spine Wo Contrast  09/04/2013   CLINICAL DATA:  Posterior head and neck pain after falling.  EXAM: CT HEAD WITHOUT CONTRAST  CT CERVICAL SPINE WITHOUT CONTRAST  TECHNIQUE: Multidetector CT imaging of the head and cervical spine was performed following the standard protocol without intravenous contrast. Multiplanar CT image reconstructions of the cervical spine were also generated.  COMPARISON:  Cervical spine radiographs 05/13/2006. Chest CT 07/07/2013.  FINDINGS: CT HEAD FINDINGS  There is no evidence of acute intracranial hemorrhage, mass lesion, brain edema or extra-axial fluid collection. The ventricles and subarachnoid spaces are appropriately sized for age. There is no CT evidence of acute cortical infarction.  The visualized paranasal sinuses, mastoid air cells and middle ears are clear. The calvarium is intact.  CT CERVICAL SPINE FINDINGS  The alignment is stable. There is no evidence acute fracture or traumatic subluxation. There are multilevel surgical changes status post C3 through C7 posterior decompression and fusion. There is an anterior plate and screws at C4-5 and bilateral pedicle screws from C3 through C7. No hardware loosening is identified. The interbody fusion appears solid. There is disc degeneration with endplate degeneration at C7-T1.  Heterogeneous enlargement of the thyroid gland appears grossly stable. There are scattered vascular calcifications. No acute soft tissue findings are evident.  IMPRESSION: 1. No acute intracranial or calvarial findings. 2. No  evidence of acute cervical spine fracture, traumatic subluxation or static signs  of instability. 3. Grossly stable appearance of the cervical spine status post laminectomy and fusion. No hardware loosening identified.   Electronically Signed   By: Roxy Horseman M.D.   On: 09/04/2013 17:25  All radiology studies independently viewed by me.     EKG Interpretation   None     EKG - Sinus tachycardia, rate 115, normal axis, normal intervals, nonspecific t wave changes, which are more pronounced than compared to prior.    MDM   1. Dehydration   2. Acute renal insufficiency    77 yo female with presenting after a fall while registering for her radiation treatment.  She has generalized weakness on exam.  CT head and C spine pending.  Labwork to evaluate generalized weakness.  Daughter reports she has had decreased PO intake recently.  Lab test reveal hyponatremia, acute renal insufficiency, which are likely secondary to poor fluid intake and dehydration.  Discussed case with Dr. Jacky Kindle who will admit for rehydration and further management.    Candyce Churn, MD 09/04/13 [phone]

## 2013-09-04 NOTE — ED Notes (Signed)
Pt walked to bathroom, pt stated she was hot and dizzy, pt became some what confused.

## 2013-09-04 NOTE — Progress Notes (Signed)
Found her lying in floor on her right side, S/P fall.  Vitals checked.

## 2013-09-04 NOTE — ED Notes (Signed)
Patient states she was dizzy and fell at the [facility] hitting her head on a carpeted floor.

## 2013-09-04 NOTE — Progress Notes (Signed)
Linda with registration to Triage reporting patient fell approximately 1525.  This nurse to lobby noted patient side lying on her right side holding registration number [id number], with no shoes on.  Reports "walking to the registration desk after hearing number '[id number]' called.  I took a few steps back and felt myself going down."  Able to talk clearly responding appropriately to questions and follow instructions.  A&Ox3, grips equal and strong.  Able to raise legs.  Denies blurred or double vision.  Reports she has been feeling lightheaded today and history of high blood pressure.  Also reports hitting the back center of her head at upper occipital region.  "I don't hurt anywhere and didn't hit anything else."  Two person asist to a sitting position.  Attempting to help to standing but Lizett rolled herself over to her stomach, then to knees, placing one foot on floor then stood with one person holding each arm.  Continues to deny any pain or problems.  Reports she "was just discharged last... " and did not complete sentence then said the day before Thanksgiving.  Spouse with her says "she was walking just fine until she walked over her for registration".  Agrees to go to ER.  Samantha from Radiation here and transferred her to [facility] ED.    At 1605 this nurse called Dr. Gustavo Lah office and notified Lowella Bandy, [place] who will notify Dr. Myna Hidalgo.        No radiation treatment received today.

## 2013-09-04 NOTE — Progress Notes (Signed)
Received call from [facility], chemo scheduler. She reports while the patient was ambulating to the window to register she passed out, fell, and hit the back of her head. Patient reports feeling dizzy just prior to the fall. Patient explains she was discharged from the rehab center on Wednesday, November 26th and hasn't been eating well. She goes on to explain that she doesn't have an appetite. Rosalind, medical oncology triage nurse, reports her 1528 vitals as follows: 98, 107, 24, 196/107, 99%. Later at 1532 she reports the patients vitals at 107, 176/80. Wheeled patient to emergency department along with her husband for an evaluation. Verbalized findings to Dr. Mitzi Hansen and linac 4. Rosalind, RN verbalized she plans to file a safety zone portal.

## 2013-09-05 ENCOUNTER — Telehealth: Payer: Self-pay

## 2013-09-05 ENCOUNTER — Ambulatory Visit: Payer: Medicare Other

## 2013-09-05 DIAGNOSIS — R55 Syncope and collapse: Secondary | ICD-10-CM | POA: Diagnosis present

## 2013-09-05 DIAGNOSIS — E860 Dehydration: Secondary | ICD-10-CM

## 2013-09-05 MED ORDER — SODIUM CHLORIDE 0.9 % IV SOLN
INTRAVENOUS | Status: DC
  Administered 2013-09-05: 50 mL via INTRAVENOUS
  Administered 2013-09-06: 06:00:00 via INTRAVENOUS

## 2013-09-05 MED ORDER — ONDANSETRON HCL 4 MG PO TABS
4.0000 mg | ORAL_TABLET | Freq: Four times a day (QID) | ORAL | Status: DC | PRN
  Administered 2013-09-07: 4 mg via ORAL
  Filled 2013-09-05: qty 1

## 2013-09-05 MED ORDER — ACETAMINOPHEN 325 MG PO TABS: 650.0000 mg | ORAL_TABLET | Freq: Four times a day (QID) | ORAL | Status: DC | PRN

## 2013-09-05 MED ORDER — FENTANYL 50 MCG/HR TD PT72
50.0000 ug | MEDICATED_PATCH | TRANSDERMAL | Status: DC
  Administered 2013-09-06 – 2013-09-09 (×2): 50 ug via TRANSDERMAL
  Filled 2013-09-05 (×2): qty 1

## 2013-09-05 MED ORDER — ALPRAZOLAM 0.25 MG PO TABS: 0.2500 mg | ORAL_TABLET | Freq: Three times a day (TID) | ORAL | Status: DC | PRN

## 2013-09-05 MED ORDER — HYDROMORPHONE HCL PF 1 MG/ML IJ SOLN
1.0000 mg | INTRAMUSCULAR | Status: DC | PRN
  Administered 2013-09-05 – 2013-09-06 (×5): 1 mg via INTRAVENOUS
  Filled 2013-09-05 (×5): qty 1

## 2013-09-05 MED ORDER — BOOST / RESOURCE BREEZE PO LIQD
1.0000 | Freq: Three times a day (TID) | ORAL | Status: DC
  Administered 2013-09-05 – 2013-09-08 (×5): 1 via ORAL

## 2013-09-05 MED ORDER — ALPRAZOLAM 0.25 MG PO TABS
0.2500 mg | ORAL_TABLET | Freq: Three times a day (TID) | ORAL | Status: DC | PRN
  Administered 2013-09-05 – 2013-09-06 (×2): 0.25 mg via ORAL
  Filled 2013-09-05 (×3): qty 1

## 2013-09-05 MED ORDER — PANTOPRAZOLE SODIUM 40 MG PO TBEC
40.0000 mg | DELAYED_RELEASE_TABLET | Freq: Every day | ORAL | Status: DC
  Administered 2013-09-06 – 2013-09-09 (×4): 40 mg via ORAL
  Filled 2013-09-05 (×5): qty 1

## 2013-09-05 MED ORDER — ONDANSETRON HCL 4 MG/2ML IJ SOLN
4.0000 mg | Freq: Four times a day (QID) | INTRAMUSCULAR | Status: DC | PRN
  Administered 2013-09-06 – 2013-09-08 (×2): 4 mg via INTRAVENOUS
  Filled 2013-09-05 (×3): qty 2

## 2013-09-05 MED ORDER — METOCLOPRAMIDE HCL 5 MG PO TABS
5.0000 mg | ORAL_TABLET | Freq: Three times a day (TID) | ORAL | Status: DC
  Administered 2013-09-05 – 2013-09-09 (×10): 5 mg via ORAL
  Filled 2013-09-05 (×18): qty 1

## 2013-09-05 MED ORDER — VERAPAMIL HCL 40 MG PO TABS
40.0000 mg | ORAL_TABLET | Freq: Once | ORAL | Status: AC
  Administered 2013-09-05: 40 mg via ORAL
  Filled 2013-09-05: qty 1

## 2013-09-05 MED ORDER — BISACODYL 5 MG PO TBEC: 5.0000 mg | DELAYED_RELEASE_TABLET | Freq: Every day | ORAL | Status: DC | PRN

## 2013-09-05 NOTE — H&P (Addendum)
Carolyn [name] is an 77 y.o. female.   PCP:   Gwen Pounds, MD   Chief Complaint:  Hyponatrmeia, DeH, AFTT, Syncope.  HPI: 42 F c known Pancreatic CA finishing up XRT.  Chemo has been stopped.  Due to Obstructive jaundice a Biliary drain was placed.  Output has been high.  Due to Cancer related Anorexia she has not been able to keep up with insensible losses very well.  In fact she had an admission in Oct for ARF c Cr 8.  We clamped the drainage catheter at that time and she developed significant pains.  We have left it open since then.  She is in a tough spot at the moment.  She has an early stage Pancreatic CA but it is in a terrible position/Location.  Original goals have were to maintain strength and weight - use CHEMO and XRT to shrink the tumor - then get Dr Donell Beers to do a Whipple surgery to cure the cancer.  Her inability to maintain weight, nutrition, hydration, or tolerate Chemo all but certainly is making her NOT a surgical candidate.  Also PEG tube and celiac blocks per Dr Dulce Sellar was recently placed on hold.  The goal of the PEG would be to provide nutrition and allow the Biliary fluid to be replaced.  However she has @ one more week of XRT and then we are going to assess where we are.  I foresee and have explained to the patient that i believe we are heading towards Hospice Palliative care as we probably will not have anything else to offer.  She saw Hospice during her last Inpatient admission.  Although the tube will help maintain nutrition and hydration, I believe it will prolong her life and the inevitable as she potentially may suffer more (after a PEG put in).  She was up in Radiation yesterday and got dizzy and passed out. While ambulating to the window to register she passed out, fell, and hit the back of her head. C/O Anorexia and not eating/drinking well.  She was Hypertensive.  She was wheeled to [facility] emergency department along with her husband for an evaluation. She was not hurt.  CCT  and neck CT showed 1. No acute intracranial or calvarial findings. 2. No evidence of acute cervical spine fracture, traumatic subluxation or static signs of instability. 3. Grossly stable appearance of the cervical spine status post laminectomy and fusion. No hardware loosening identified.  EKG - Sinus tachycardia, rate 115, normal axis, normal intervals, nonspecific t wave changes, which are more pronounced than compared to prior.  They called Dr A to admit for [facility], Syncope, hyponatremia, and ARF.  Noted generalized weakness without Focal findings. She was admitted to floor c IVF hydration.  On talking c pt she reports more recent drainage output. She has a little pain. She reports only wanting hospice/palliative care and ready to give up.  She is not sure her family is ready yret.  She asked @ being put to sleep and I said we do not do that,   Past Medical History:  Past Medical History  Diagnosis Date  . Hypertension   . Hyperlipidemia   . GERD (gastroesophageal reflux disease)   . Asthma   . Obstructive jaundice 06/2013  . Pancreatitis   . Arthritis   . Cancer   . Heart murmur   . Thyroid disease   . Pancreatic cancer 07/05/13    pancreatic head=adenocarcinoma    Past Surgical History  Procedure Laterality  Date  . Neck surgery  2005/2007  . Hand surgery  2010  . Cataract extraction    . Abdominal hysterectomy    . Eus N/A 07/05/2013    Procedure: ESOPHAGEAL ENDOSCOPIC ULTRASOUND (EUS) RADIAL;  Surgeon: Willis Modena, MD;  Location: [facility] ENDOSCOPY;  Service: Endoscopy;  Laterality: N/A;  . Ercp N/A 07/05/2013    Procedure: ENDOSCOPIC RETROGRADE CHOLANGIOPANCREATOGRAPHY (ERCP);  Surgeon: Willis Modena, MD;  Location: [facility];  Service: Endoscopy;  Laterality: N/A;  . Buttock cyst removal    . Cholecystectomy        Allergies:   Allergies  Allergen Reactions  . Other Other (See Comments)    Alka seltzer caused severe swelling     Medications: Prior to Admission  medications   Medication Sig Start Date End Date Taking? Authorizing Provider  acetaminophen (TYLENOL) 325 MG tablet Take 650 mg by mouth every 6 (six) hours as needed for mild pain.   Yes Historical Provider, MD  ALPRAZolam (XANAX) 0.25 MG tablet Take 0.25 mg by mouth every 8 (eight) hours as needed for anxiety or sleep.   Yes Historical Provider, MD  cholecalciferol (VITAMIN D) 1000 UNITS tablet Take 1,000 Units by mouth daily.   Yes Historical Provider, MD  dronabinol (MARINOL) 2.5 MG capsule Take 2.5 mg by mouth 2 (two) times daily before a meal.   Yes Historical Provider, MD  esomeprazole (NEXIUM) 40 MG capsule Take 40 mg by mouth daily before breakfast.   Yes Historical Provider, MD  fentaNYL (DURAGESIC - DOSED MCG/HR) 50 MCG/HR Place 1 patch (50 mcg total) onto the skin every 3 (three) days. 08/21/13  Yes Gwen Pounds, MD  lipase/protease/amylase (CREON-12/PANCREASE) 12000 UNITS CPEP capsule Take 3 capsules by mouth 3 (three) times daily with meals.   Yes Historical Provider, MD  metoCLOPramide (REGLAN) 5 MG tablet Take 1 tablet (5 mg total) by mouth 3 (three) times daily before meals. 08/21/13  Yes Gwen Pounds, MD  ondansetron (ZOFRAN) 8 MG tablet Take by mouth every 8 (eight) hours as needed for nausea or vomiting (nausea).   Yes Historical Provider, MD  potassium chloride SA (K-DUR,KLOR-CON) 20 MEQ tablet Take 20 mEq by mouth daily.   Yes Historical Provider, MD  verapamil (CALAN) 40 MG tablet Take 40 mg by mouth once.   Yes Historical Provider, MD     Medications Prior to Admission  Medication Sig Dispense Refill  . acetaminophen (TYLENOL) 325 MG tablet Take 650 mg by mouth every 6 (six) hours as needed for mild pain.      Marland Kitchen ALPRAZolam (XANAX) 0.25 MG tablet Take 0.25 mg by mouth every 8 (eight) hours as needed for anxiety or sleep.      . cholecalciferol (VITAMIN D) 1000 UNITS tablet Take 1,000 Units by mouth daily.      Marland Kitchen dronabinol (MARINOL) 2.5 MG capsule Take 2.5 mg by mouth 2  (two) times daily before a meal.      . esomeprazole (NEXIUM) 40 MG capsule Take 40 mg by mouth daily before breakfast.      . fentaNYL (DURAGESIC - DOSED MCG/HR) 50 MCG/HR Place 1 patch (50 mcg total) onto the skin every 3 (three) days.  10 patch  0  . lipase/protease/amylase (CREON-12/PANCREASE) 12000 UNITS CPEP capsule Take 3 capsules by mouth 3 (three) times daily with meals.      . metoCLOPramide (REGLAN) 5 MG tablet Take 1 tablet (5 mg total) by mouth 3 (three) times daily before meals.      Marland Kitchen  ondansetron (ZOFRAN) 8 MG tablet Take by mouth every 8 (eight) hours as needed for nausea or vomiting (nausea).      . potassium chloride SA (K-DUR,KLOR-CON) 20 MEQ tablet Take 20 mEq by mouth daily.      . verapamil (CALAN) 40 MG tablet Take 40 mg by mouth once.         Social History:  reports that she quit smoking about 2 months ago. Her smoking use included Cigarettes. She has a 20 pack-year smoking history. She has never used smokeless tobacco. She reports that she drinks about 0.6 ounces of alcohol per week. She reports that she does not use illicit drugs.  Family History: Family History  Problem Relation Age of Onset  . Cancer Daughter     Breast    Review of Systems:  Review of Systems - Full ROS done.   Physical Exam:  Blood pressure 150/45, pulse 83, temperature 97.9 F (36.6 C), temperature source Oral, resp. rate 16, height 5\' 3"  (1.6 m), weight 83.46 kg (183 lb 15.9 oz), SpO2 98.00%. Filed Vitals:   09/04/13 2017 09/05/13 0018 09/05/13 0100 09/05/13 0600  BP: 187/58 191/60 187/56 150/45  Pulse: 107 88  83  Temp:  98.7 F (37.1 C) 98.5 F (36.9 C) 97.9 F (36.6 C)  TempSrc:  Oral Oral Oral  Resp: 20 18 16 16   Height:   5\' 3"  (1.6 m)   Weight:   83.46 kg (183 lb 15.9 oz)   SpO2: 100% 99% 100% 98%   General appearance: A and O - not confused.  Thinner Head: Normocephalic, without obvious abnormality, atraumatic Eyes: conjunctivae/corneas clear. PERRL, EOM's intact.   Nose: Nares normal. Septum midline. Mucosa normal. No drainage or sinus tenderness. OP - dry Neck: no adenopathy, no carotid bruit, no JVD and thyroid not enlarged, symmetric, no tenderness/mass/nodules Resp: CTA Cardio: Reg GI: soft, non-tender; bowel sounds normal; no masses,  no organomegaly.  Biliary drain CDI Extremities: extremities normal, atraumatic, no cyanosis or edema Pulses: 2+ and symmetric Lymph nodes:  no cervical lymphadenopathy Neurologic: Alert and oriented X 3. Generalized weakness.    Labs on Admission:   Recent Labs  09/04/13 1717 09/04/13 1852  NA PATIENT IDENTIFICATION ERROR. PLEASE DISREGARD RESULTS. ACCOUNT WILL BE CREDITED. 126*  K PATIENT IDENTIFICATION ERROR. PLEASE DISREGARD RESULTS. ACCOUNT WILL BE CREDITED. 3.4*  CL PATIENT IDENTIFICATION ERROR. PLEASE DISREGARD RESULTS. ACCOUNT WILL BE CREDITED. 91*  CO2 PATIENT IDENTIFICATION ERROR. PLEASE DISREGARD RESULTS. ACCOUNT WILL BE CREDITED. 19  GLUCOSE PATIENT IDENTIFICATION ERROR. PLEASE DISREGARD RESULTS. ACCOUNT WILL BE CREDITED. 138*  BUN PATIENT IDENTIFICATION ERROR. PLEASE DISREGARD RESULTS. ACCOUNT WILL BE CREDITED. 54*  CREATININE PATIENT IDENTIFICATION ERROR. PLEASE DISREGARD RESULTS. ACCOUNT WILL BE CREDITED. 2.21*  CALCIUM PATIENT IDENTIFICATION ERROR. PLEASE DISREGARD RESULTS. ACCOUNT WILL BE CREDITED. 10.8*    Recent Labs  09/04/13 1717  AST PATIENT IDENTIFICATION ERROR. PLEASE DISREGARD RESULTS. ACCOUNT WILL BE CREDITED.  ALT PATIENT IDENTIFICATION ERROR. PLEASE DISREGARD RESULTS. ACCOUNT WILL BE CREDITED.  ALKPHOS PATIENT IDENTIFICATION ERROR. PLEASE DISREGARD RESULTS. ACCOUNT WILL BE CREDITED.  BILITOT PATIENT IDENTIFICATION ERROR. PLEASE DISREGARD RESULTS. ACCOUNT WILL BE CREDITED.  PROT PATIENT IDENTIFICATION ERROR. PLEASE DISREGARD RESULTS. ACCOUNT WILL BE CREDITED.  ALBUMIN PATIENT IDENTIFICATION ERROR. PLEASE DISREGARD RESULTS. ACCOUNT WILL BE CREDITED.   No results found for  this basename: LIPASE, AMYLASE,  in the last 72 hours  Recent Labs  09/04/13 1717 09/04/13 1852  WBC PATIENT IDENTIFICATION ERROR. PLEASE DISREGARD RESULTS. ACCOUNT WILL BE CREDITED. 15.9*  HGB  PATIENT IDENTIFICATION ERROR. PLEASE DISREGARD RESULTS. ACCOUNT WILL BE CREDITED. 12.8  HCT PATIENT IDENTIFICATION ERROR. PLEASE DISREGARD RESULTS. ACCOUNT WILL BE CREDITED. 37.4  MCV PATIENT IDENTIFICATION ERROR. PLEASE DISREGARD RESULTS. ACCOUNT WILL BE CREDITED. 81.0  PLT PATIENT IDENTIFICATION ERROR. PLEASE DISREGARD RESULTS. ACCOUNT WILL BE CREDITED. 400    Recent Labs  09/04/13 1717 09/04/13 1852  TROPONINI PATIENT IDENTIFICATION ERROR. PLEASE DISREGARD RESULTS. ACCOUNT WILL BE CREDITED. <0.30   Lab Results  Component Value Date   INR 1.24 07/24/2013   INR 0.94 07/02/2013     LAB RESULT POCT:  Results for orders placed during the hospital encounter of 09/04/13  CBC      Result Value Range   WBC    4.0 - 10.5 K/uL   Value: PATIENT IDENTIFICATION ERROR. PLEASE DISREGARD RESULTS. ACCOUNT WILL BE CREDITED.   RBC    3.87 - 5.11 MIL/uL   Value: PATIENT IDENTIFICATION ERROR. PLEASE DISREGARD RESULTS. ACCOUNT WILL BE CREDITED.   Hemoglobin    12.0 - 15.0 g/dL   Value: PATIENT IDENTIFICATION ERROR. PLEASE DISREGARD RESULTS. ACCOUNT WILL BE CREDITED.   HCT    36.0 - 46.0 %   Value: PATIENT IDENTIFICATION ERROR. PLEASE DISREGARD RESULTS. ACCOUNT WILL BE CREDITED.   MCV    78.0 - 100.0 fL   Value: PATIENT IDENTIFICATION ERROR. PLEASE DISREGARD RESULTS. ACCOUNT WILL BE CREDITED.   MCH    26.0 - 34.0 pg   Value: PATIENT IDENTIFICATION ERROR. PLEASE DISREGARD RESULTS. ACCOUNT WILL BE CREDITED.   MCHC    30.0 - 36.0 g/dL   Value: PATIENT IDENTIFICATION ERROR. PLEASE DISREGARD RESULTS. ACCOUNT WILL BE CREDITED.   RDW    11.5 - 15.5 %   Value: PATIENT IDENTIFICATION ERROR. PLEASE DISREGARD RESULTS. ACCOUNT WILL BE CREDITED.   Platelets    150 - 400 K/uL   Value: PATIENT IDENTIFICATION ERROR.  PLEASE DISREGARD RESULTS. ACCOUNT WILL BE CREDITED.  BASIC METABOLIC PANEL      Result Value Range   Sodium    135 - 145 mEq/L   Value: PATIENT IDENTIFICATION ERROR. PLEASE DISREGARD RESULTS. ACCOUNT WILL BE CREDITED.   Potassium    3.5 - 5.1 mEq/L   Value: PATIENT IDENTIFICATION ERROR. PLEASE DISREGARD RESULTS. ACCOUNT WILL BE CREDITED.   Chloride    96 - 112 mEq/L   Value: PATIENT IDENTIFICATION ERROR. PLEASE DISREGARD RESULTS. ACCOUNT WILL BE CREDITED.   CO2    19 - 32 mEq/L   Value: PATIENT IDENTIFICATION ERROR. PLEASE DISREGARD RESULTS. ACCOUNT WILL BE CREDITED.   Glucose, Bld    70 - 99 mg/dL   Value: PATIENT IDENTIFICATION ERROR. PLEASE DISREGARD RESULTS. ACCOUNT WILL BE CREDITED.   BUN    6 - 23 mg/dL   Value: PATIENT IDENTIFICATION ERROR. PLEASE DISREGARD RESULTS. ACCOUNT WILL BE CREDITED.   Creatinine, Ser    0.50 - 1.10 mg/dL   Value: PATIENT IDENTIFICATION ERROR. PLEASE DISREGARD RESULTS. ACCOUNT WILL BE CREDITED.   Calcium    8.4 - 10.5 mg/dL   Value: PATIENT IDENTIFICATION ERROR. PLEASE DISREGARD RESULTS. ACCOUNT WILL BE CREDITED.   GFR calc non Af Amer    >90 mL/min   Value: PATIENT IDENTIFICATION ERROR. PLEASE DISREGARD RESULTS. ACCOUNT WILL BE CREDITED.   GFR calc Af Amer    >90 mL/min   Value: PATIENT IDENTIFICATION ERROR. PLEASE DISREGARD RESULTS. ACCOUNT WILL BE CREDITED.  URINALYSIS, ROUTINE W REFLEX MICROSCOPIC      Result Value Range   Color, Urine AMBER (*) YELLOW   APPearance CLOUDY (*)  CLEAR   Specific Gravity, Urine 1.021  1.005 - 1.030   pH 5.5  5.0 - 8.0   Glucose, UA NEGATIVE  NEGATIVE mg/dL   Hgb urine dipstick NEGATIVE  NEGATIVE   Bilirubin Urine SMALL (*) NEGATIVE   Ketones, ur NEGATIVE  NEGATIVE mg/dL   Protein, ur 132 (*) NEGATIVE mg/dL   Urobilinogen, UA 0.2  0.0 - 1.0 mg/dL   Nitrite NEGATIVE  NEGATIVE   Leukocytes, UA NEGATIVE  NEGATIVE  TROPONIN I      Result Value Range   Troponin I    <0.30 ng/mL   Value: PATIENT IDENTIFICATION ERROR.  PLEASE DISREGARD RESULTS. ACCOUNT WILL BE CREDITED.  HEPATIC FUNCTION PANEL      Result Value Range   Total Protein    6.0 - 8.3 g/dL   Value: PATIENT IDENTIFICATION ERROR. PLEASE DISREGARD RESULTS. ACCOUNT WILL BE CREDITED.   Albumin    3.5 - 5.2 g/dL   Value: PATIENT IDENTIFICATION ERROR. PLEASE DISREGARD RESULTS. ACCOUNT WILL BE CREDITED.   AST    0 - 37 U/L   Value: PATIENT IDENTIFICATION ERROR. PLEASE DISREGARD RESULTS. ACCOUNT WILL BE CREDITED.   ALT    0 - 35 U/L   Value: PATIENT IDENTIFICATION ERROR. PLEASE DISREGARD RESULTS. ACCOUNT WILL BE CREDITED.   Alkaline Phosphatase    39 - 117 U/L   Value: PATIENT IDENTIFICATION ERROR. PLEASE DISREGARD RESULTS. ACCOUNT WILL BE CREDITED.   Total Bilirubin    0.3 - 1.2 mg/dL   Value: PATIENT IDENTIFICATION ERROR. PLEASE DISREGARD RESULTS. ACCOUNT WILL BE CREDITED.   Bilirubin, Direct    0.0 - 0.3 mg/dL   Value: PATIENT IDENTIFICATION ERROR. PLEASE DISREGARD RESULTS. ACCOUNT WILL BE CREDITED.   Indirect Bilirubin       Value: PATIENT IDENTIFICATION ERROR. PLEASE DISREGARD RESULTS. ACCOUNT WILL BE CREDITED.  CBC      Result Value Range   WBC 15.9 (*) 4.0 - 10.5 K/uL   RBC 4.62  3.87 - 5.11 MIL/uL   Hemoglobin 12.8  12.0 - 15.0 g/dL   HCT 44.0  10.2 - 72.5 %   MCV 81.0  78.0 - 100.0 fL   MCH 27.7  26.0 - 34.0 pg   MCHC 34.2  30.0 - 36.0 g/dL   RDW 36.6  44.0 - 34.7 %   Platelets 400  150 - 400 K/uL  TROPONIN I      Result Value Range   Troponin I <0.30  <0.30 ng/mL  BASIC METABOLIC PANEL      Result Value Range   Sodium 126 (*) 135 - 145 mEq/L   Potassium 3.4 (*) 3.5 - 5.1 mEq/L   Chloride 91 (*) 96 - 112 mEq/L   CO2 19  19 - 32 mEq/L   Glucose, Bld 138 (*) 70 - 99 mg/dL   BUN 54 (*) 6 - 23 mg/dL   Creatinine, Ser 4.25 (*) 0.50 - 1.10 mg/dL   Calcium 95.6 (*) 8.4 - 10.5 mg/dL   GFR calc non Af Amer 21 (*) >90 mL/min   GFR calc Af Amer 24 (*) >90 mL/min  URINE MICROSCOPIC-ADD ON      Result Value Range   Squamous Epithelial /  LPF MANY (*) RARE   WBC, UA 0-2  <3 WBC/hpf   Bacteria, UA FEW (*) RARE   Casts HYALINE CASTS (*) NEGATIVE      Radiological Exams on Admission: Ct Head Wo Contrast  09/04/2013   CLINICAL DATA:  Posterior head and neck pain after  falling.  EXAM: CT HEAD WITHOUT CONTRAST  CT CERVICAL SPINE WITHOUT CONTRAST  TECHNIQUE: Multidetector CT imaging of the head and cervical spine was performed following the standard protocol without intravenous contrast. Multiplanar CT image reconstructions of the cervical spine were also generated.  COMPARISON:  Cervical spine radiographs 05/13/2006. Chest CT 07/07/2013.  FINDINGS: CT HEAD FINDINGS  There is no evidence of acute intracranial hemorrhage, mass lesion, brain edema or extra-axial fluid collection. The ventricles and subarachnoid spaces are appropriately sized for age. There is no CT evidence of acute cortical infarction.  The visualized paranasal sinuses, mastoid air cells and middle ears are clear. The calvarium is intact.  CT CERVICAL SPINE FINDINGS  The alignment is stable. There is no evidence acute fracture or traumatic subluxation. There are multilevel surgical changes status post C3 through C7 posterior decompression and fusion. There is an anterior plate and screws at C4-5 and bilateral pedicle screws from C3 through C7. No hardware loosening is identified. The interbody fusion appears solid. There is disc degeneration with endplate degeneration at C7-T1.  Heterogeneous enlargement of the thyroid gland appears grossly stable. There are scattered vascular calcifications. No acute soft tissue findings are evident.  IMPRESSION: 1. No acute intracranial or calvarial findings. 2. No evidence of acute cervical spine fracture, traumatic subluxation or static signs of instability. 3. Grossly stable appearance of the cervical spine status post laminectomy and fusion. No hardware loosening identified.   Electronically Signed   By: Roxy Horseman M.D.   On: 09/04/2013  17:25   Ct Cervical Spine Wo Contrast  09/04/2013   CLINICAL DATA:  Posterior head and neck pain after falling.  EXAM: CT HEAD WITHOUT CONTRAST  CT CERVICAL SPINE WITHOUT CONTRAST  TECHNIQUE: Multidetector CT imaging of the head and cervical spine was performed following the standard protocol without intravenous contrast. Multiplanar CT image reconstructions of the cervical spine were also generated.  COMPARISON:  Cervical spine radiographs 05/13/2006. Chest CT 07/07/2013.  FINDINGS: CT HEAD FINDINGS  There is no evidence of acute intracranial hemorrhage, mass lesion, brain edema or extra-axial fluid collection. The ventricles and subarachnoid spaces are appropriately sized for age. There is no CT evidence of acute cortical infarction.  The visualized paranasal sinuses, mastoid air cells and middle ears are clear. The calvarium is intact.  CT CERVICAL SPINE FINDINGS  The alignment is stable. There is no evidence acute fracture or traumatic subluxation. There are multilevel surgical changes status post C3 through C7 posterior decompression and fusion. There is an anterior plate and screws at C4-5 and bilateral pedicle screws from C3 through C7. No hardware loosening is identified. The interbody fusion appears solid. There is disc degeneration with endplate degeneration at C7-T1.  Heterogeneous enlargement of the thyroid gland appears grossly stable. There are scattered vascular calcifications. No acute soft tissue findings are evident.  IMPRESSION: 1. No acute intracranial or calvarial findings. 2. No evidence of acute cervical spine fracture, traumatic subluxation or static signs of instability. 3. Grossly stable appearance of the cervical spine status post laminectomy and fusion. No hardware loosening identified.   Electronically Signed   By: Roxy Horseman M.D.   On: 09/04/2013 17:25      Orders placed during the hospital encounter of 09/04/13  . ED EKG  . ED EKG     Assessment/Plan Active Problems:    Dehydration   Pancreatic CA finishing up XRT - She has @ 6-7 more treatments left.  Chemo stopped.  DNR confirmed. She is mentally ready now to  accept giving up on aggressive care and pursuing Hospice palliative care measures only. I will inform husband and get Hopsice consult.  Once I stop the fluids and she stops eating she will pass within 1-2 weeks.  My advice is to stop the XRT.  She seems ready.  Son and daughter are wishing for her to continue till the end.  I do not see the point.  Also I cannot run a CT c contrast due to the kidney failure.  Clinically she is no better from the XRT so continuing does not make sense to me.  Obstructive jaundice - Biliary drain c Output> Input  CA induced Anorexia and Significant Protein Calorie malnutrition - I agree with Dr Dulce Sellar that a PEG is not a good idea.  It would solve some nutrition and fluid status issues but ultimately she is dying and I am more in favor of pursuing Hospice palliative care options only at this point.  Pain and Anxiety - meds being provided.  Hypertensive - I have her off many of her prior anti-hypertensive meds after her admission for ARF c Hypotension.  No reason to pursue.  Dizzy/DeH/Syncope/hyponatremia/ARF c Cr 2.21 - getting fluids and IVF hydration.  This may be futile as after I fix the current situation it would be just a matter of time before this happens again.  I will lower her fluid rate and ultimately hep lock her after hospice sees her and everyone is on the same page.    Webber Michiels M 09/05/2013, 7:26 AM

## 2013-09-05 NOTE — Care Management Note (Addendum)
    Page 1 of 1   09/06/2013     4:57:50 PM   CARE MANAGEMENT NOTE 09/06/2013  Patient:  Carolyn [name], Carolyn [name]   Account Number:  [id number]  Date Initiated:  09/05/2013  Documentation initiated by:  Lanier Clam  Subjective/Objective Assessment:   77 Y/O F ADMITTED W/DEHYDRATION.PANCREATIC CA.READMIT-11/10-11/17/14.DNR.     Action/Plan:   FROM HOME.ACTIVE [facility] HHRN/PT.   Anticipated DC Date:  09/07/2013   Anticipated DC Plan:  HOME W HOSPICE CARE      DC Planning Services  CM consult      Choice offered to / List presented to:  C-1 Patient        HH arranged  HH-1 RN      [facility] agency  [facility] [place]   Status of service:  In process, will continue to follow Medicare Important Message given?   (If response is "NO", the following Medicare IM given date fields will be blank) Date Medicare IM given:   Date Additional Medicare IM given:    Discharge Disposition:    Per UR Regulation:  Reviewed for med. necessity/level of care/duration of stay  If discussed at Long Length of Stay Meetings, dates discussed:    Comments:  09/06/13 Sanaii Caporaso RN,BSN NCM [phone] REFERRAL TO OFFER CHOICE.[facility] CHOSEN.MARGIE LIASON MADE AWARE WILL F/U IN AM.PT-RW.PATIENT NOT SURE ABOUT OTHER DME @ PRESENT.WILL NEED RW-[facility] DME REP MADE AWARE.CONTACTS SPOUSE:JOE Haueter H#[phone].DTR-WANDA PHILLIPS C#[phone].SON JONATHAN-[phone].FAMILY CAN TRANSPORT HOME.  09/05/13 Batul Diego RN,BSN NCM [phone] CONFIRMED W/[facility] KRISTEN ACTIVE W/HHRN/PT.

## 2013-09-05 NOTE — Progress Notes (Signed)
Patient shared with me that the sooner she doesn't eat or drink it would be over, if Im dying there is no reason to eat she said.  She has asked for ice chips.  Offered to call the chaplain to speak with her but she declined stating her daughter is a Optician, dispensing.  Daughter came into the room and discussion began about doctors visit, treatment requests of patient, dnr status, medication for pain, etc.  Daughter crying asking her mother to fight for the miracle and patient remains quiet in bed with eyes closed.

## 2013-09-05 NOTE — Progress Notes (Signed)
Pt arrived from ED with family. Alert and oriented x 4. MD paged [facility], Dr. Jacky Kindle paged upon arrival to 5E.

## 2013-09-05 NOTE — Progress Notes (Signed)
INITIAL NUTRITION ASSESSMENT  Pt meets criteria for severe MALNUTRITION in the context of chronic illness as evidenced by 0% estimated energy intake for weeks per pt report with 8% weight loss in the past 2 weeks with 13.7% weight loss in the past month.  DOCUMENTATION CODES Per approved criteria  -Severe malnutrition in the context of chronic illness -Obesity Unspecified   INTERVENTION: - Resource Breeze TID - Recommend MD monitor pt's potassium, phosphorus, and magnesium as PO intake improves as pt at high risk for refeeding syndrome - Multivitamin 1 tablet PO daily - Will continue to monitor   NUTRITION DIAGNOSIS: Inadequate oral intake related to no appetite as evidenced by pt report.    Goal: Nutrition per goals of care/pt wishes  Monitor:  Weights, labs, intake, palliative care goals  Reason for Assessment: Malnutrition screening tool   77 y.o. female  Admitting Dx: Hyponatremia, failure to thrive, syncope   ASSESSMENT: Pt known to RD from previous admission earlier this month. Noted pt with poor appetite then with nausea. Has been seen by [facility] RD. Noted pt with 25-50% meal intake during admission the end of September 2014 and reported eating primarily saltine crackers for 3 weeks PTA. Intake during admission in October was 25-50% of meals.   Pt with hx of borderline resectable pancreatic CA who was undergoing chemoradiation in hopes of tumor shrinkage for possible Whipple procedure. In the process of finishing up radiation treatment, chemotherapy has been stopped. S/p biliary drain for obstructive jaundice with high output. On Marinol and Creon PTA. Met with pt today who reports not eating/drinking anything for weeks PTA. States she has had on/off nausea. Pt's weight down 16 pounds since last inpatient RD visit on 08/15/13. Daughter reports pt enjoys Forensic scientist which was provided. Pt requested cheez-its which were also provided.   Per MD  notes, PEG has been discussed however pt told MD about her interest in hospice/palliative measures. Awaiting palliative care consult discussion.   - Sodium low - Potassium slightly low - BUN/Cr elevated with low GFR  Height: Ht Readings from Last 1 Encounters:  09/05/13 5\' 3"  (1.6 m)    Weight: Wt Readings from Last 1 Encounters:  09/05/13 183 lb 15.9 oz (83.46 kg)    Ideal Body Weight: 115 lb   % Ideal Body Weight: 159%  Wt Readings from Last 10 Encounters:  09/05/13 183 lb 15.9 oz (83.46 kg)  08/29/13 184 lb (83.462 kg)  08/25/13 185 lb 9.6 oz (84.188 kg)  08/25/13 183 lb (83.008 kg)  08/23/13 183 lb (83.008 kg)  08/14/13 199 lb 8.3 oz (90.5 kg)  08/09/13 213 lb 6.4 oz (96.798 kg)  08/08/13 212 lb (96.163 kg)  08/04/13 214 lb (97.07 kg)  07/28/13 211 lb 6.7 oz (95.9 kg)    Usual Body Weight: 215 lb  % Usual Body Weight: 85%  BMI:  Body mass index is 32.6 kg/(m^2). Class I obesity  Estimated Nutritional Needs: Kcal: 1600-1800 Protein: 80-90g Fluid: 1.6-1.8L/day  Skin: intact  Diet Order: General  EDUCATION NEEDS: -No education needs identified at this time   Intake/Output Summary (Last 24 hours) at 09/05/13 1150 Last data filed at 09/05/13 0900  Gross per 24 hour  Intake    700 ml  Output    250 ml  Net    450 ml    Last BM: 12/1  Labs:   Recent Labs Lab 09/04/13 1717 09/04/13 1852  NA PATIENT IDENTIFICATION ERROR. PLEASE DISREGARD RESULTS. ACCOUNT WILL  BE CREDITED. 126*  K PATIENT IDENTIFICATION ERROR. PLEASE DISREGARD RESULTS. ACCOUNT WILL BE CREDITED. 3.4*  CL PATIENT IDENTIFICATION ERROR. PLEASE DISREGARD RESULTS. ACCOUNT WILL BE CREDITED. 91*  CO2 PATIENT IDENTIFICATION ERROR. PLEASE DISREGARD RESULTS. ACCOUNT WILL BE CREDITED. 19  BUN PATIENT IDENTIFICATION ERROR. PLEASE DISREGARD RESULTS. ACCOUNT WILL BE CREDITED. 54*  CREATININE PATIENT IDENTIFICATION ERROR. PLEASE DISREGARD RESULTS. ACCOUNT WILL BE CREDITED. 2.21*  CALCIUM PATIENT  IDENTIFICATION ERROR. PLEASE DISREGARD RESULTS. ACCOUNT WILL BE CREDITED. 10.8*  GLUCOSE PATIENT IDENTIFICATION ERROR. PLEASE DISREGARD RESULTS. ACCOUNT WILL BE CREDITED. 138*    CBG (last 3)  No results found for this basename: GLUCAP,  in the last 72 hours  Scheduled Meds: . [START ON 09/06/2013] fentaNYL  50 mcg Transdermal Q72H  . metoCLOPramide  5 mg Oral TID AC  . pantoprazole  40 mg Oral Daily    Continuous Infusions: . sodium chloride 50 mL (09/05/13 0944)    Past Medical History  Diagnosis Date  . Hypertension   . Hyperlipidemia   . GERD (gastroesophageal reflux disease)   . Asthma   . Obstructive jaundice 06/2013  . Pancreatitis   . Arthritis   . Cancer   . Heart murmur   . Thyroid disease   . Pancreatic cancer 07/05/13    pancreatic head=adenocarcinoma    Past Surgical History  Procedure Laterality Date  . Neck surgery  2005/2007  . Hand surgery  2010  . Cataract extraction    . Abdominal hysterectomy    . Eus N/A 07/05/2013    Procedure: ESOPHAGEAL ENDOSCOPIC ULTRASOUND (EUS) RADIAL;  Surgeon: Willis Modena, MD;  Location: [facility];  Service: Endoscopy;  Laterality: N/A;  . Ercp N/A 07/05/2013    Procedure: ENDOSCOPIC RETROGRADE CHOLANGIOPANCREATOGRAPHY (ERCP);  Surgeon: Willis Modena, MD;  Location: [facility];  Service: Endoscopy;  Laterality: N/A;  . Buttock cyst removal    . Cholecystectomy      Levon Hedger MS, RD, LDN [phone] Pager [phone] After Hours Pager

## 2013-09-05 NOTE — Telephone Encounter (Signed)
Spoke with Dr.Taylor she will notify dr.Moody on 09/06/13 to discuss whether to continue/discontinue radiation treatments.Referral for palliative care has been made.

## 2013-09-05 NOTE — Progress Notes (Signed)
Spoke with Dr. Jacky Kindle in regards to admiting orders. Will add Xanax and Fentanyl as she takes at home. VORB Dilaudid 1 mg IV q 4 hours prn. Medications for BP requested. Per Dr. Jacky Kindle, will address in the a.m, on rounds and not to disturb about anything. VORB.

## 2013-09-05 NOTE — Progress Notes (Signed)
Visited twice. The first time the patient, spouse and brother were in the room and the patient asked me to return in thirty minutes. Returned and daughter was now also present. The daughter was tearful. She was talking about not wanting "to be selfish" but didn't want to see her mother "give up" either. Facilitated a family discussion where members and the patient could say what they needed to. I allowed the patient to tell her story. She told me "I don't want to die, but I also don't want to go on hurting like this." She talked about how her pain is getting worse each day, particularly this week. She also talked about how much the radiation "burns." Facilitatiated a discussion of options that they were aware of and about what is most important to each one--How do you want to spend the days? Pain management and quality of life were reoccurrent themes. Supported; listened. Family has a strong Investment banker, operational. We ended our meeting with prayer.  Daughter is a Optician, dispensing. 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

## 2013-09-06 ENCOUNTER — Ambulatory Visit: Payer: Medicare Other

## 2013-09-06 DIAGNOSIS — F411 Generalized anxiety disorder: Secondary | ICD-10-CM

## 2013-09-06 DIAGNOSIS — R109 Unspecified abdominal pain: Secondary | ICD-10-CM

## 2013-09-06 DIAGNOSIS — Z515 Encounter for palliative care: Secondary | ICD-10-CM

## 2013-09-06 DIAGNOSIS — C259 Malignant neoplasm of pancreas, unspecified: Secondary | ICD-10-CM

## 2013-09-06 LAB — CBC
MCV: 82.2 fL (ref 78.0–100.0)
Platelets: 295 10*3/uL (ref 150–400)
RDW: 15.3 % (ref 11.5–15.5)
WBC: 10.2 10*3/uL (ref 4.0–10.5)

## 2013-09-06 LAB — COMPREHENSIVE METABOLIC PANEL
ALT: 53 U/L — ABNORMAL HIGH (ref 0–35)
AST: 42 U/L — ABNORMAL HIGH (ref 0–37)
Albumin: 2.4 g/dL — ABNORMAL LOW (ref 3.5–5.2)
Alkaline Phosphatase: 100 U/L (ref 39–117)
Calcium: 9.3 mg/dL (ref 8.4–10.5)
Chloride: 99 mEq/L (ref 96–112)
Creatinine, Ser: 1.05 mg/dL (ref 0.50–1.10)
GFR calc non Af Amer: 51 mL/min — ABNORMAL LOW (ref >90)
Glucose, Bld: 116 mg/dL — ABNORMAL HIGH (ref 70–99)
Total Bilirubin: 0.4 mg/dL (ref 0.3–1.2)

## 2013-09-06 MED ORDER — PANCRELIPASE (LIP-PROT-AMYL) 12000-38000 UNITS PO CPEP
2.0000 | ORAL_CAPSULE | Freq: Three times a day (TID) | ORAL | Status: DC
  Administered 2013-09-06 – 2013-09-09 (×7): 2 via ORAL
  Filled 2013-09-06 (×13): qty 2

## 2013-09-06 MED ORDER — ALPRAZOLAM 0.25 MG PO TABS
0.2500 mg | ORAL_TABLET | Freq: Three times a day (TID) | ORAL | Status: DC
  Administered 2013-09-06 – 2013-09-08 (×6): 0.25 mg via ORAL
  Filled 2013-09-06 (×8): qty 1

## 2013-09-06 MED ORDER — PREDNISONE 10 MG PO TABS
10.0000 mg | ORAL_TABLET | Freq: Every day | ORAL | Status: DC
  Administered 2013-09-06 – 2013-09-09 (×3): 10 mg via ORAL
  Filled 2013-09-06 (×5): qty 1

## 2013-09-06 MED ORDER — MORPHINE SULFATE (CONCENTRATE) 10 MG /0.5 ML PO SOLN
5.0000 mg | ORAL | Status: DC | PRN
  Administered 2013-09-06 – 2013-09-08 (×4): 5 mg via ORAL
  Filled 2013-09-06 (×4): qty 0.5

## 2013-09-06 MED ORDER — CITALOPRAM HYDROBROMIDE 20 MG PO TABS
20.0000 mg | ORAL_TABLET | Freq: Every day | ORAL | Status: DC
  Administered 2013-09-06 – 2013-09-09 (×4): 20 mg via ORAL
  Filled 2013-09-06 (×4): qty 1

## 2013-09-06 NOTE — Clinical Documentation Improvement (Signed)
THIS DOCUMENT IS NOT A PERMANENT PART OF THE MEDICAL RECORD  Please update your documentation with the medical record to reflect your response to this query. If you need help knowing how to do this please call [phone].  09/06/13   Dear Dr. Timothy Lasso / Associates,  In a better effort to capture your patient's severity of illness, reflect appropriate length of stay and utilization of resources, a review of the patient medical record has revealed the following indicators.    Based on your clinical judgment, please clarify and document in a progress note and/or discharge summary the clinical condition associated with the following supporting information:  In responding to this query please exercise your independent judgment.  The fact that a query is asked, does not imply that any particular answer is desired or expected. Please clarify " Significant" Protein Calorie malnutrition.    Possible Clinical Conditions? _______Severe Malnutrition   _______Severe Protein Calorie Malnutrition  _______Other Condition________________ _______Cannot clinically determine     Supporting Information:INITIAL NUTRITION ASSESSMENT   09/05/2013   Risk Factors:     Hyponatremia, failure to thrive, syncope, Sodium low  - Potassium slightly low  - BUN/Cr elevated with low GFR  Signs & Symptoms: Ht :5\' 3"    Wt :183 lb 15.9 oz          BMI:32.6 kg Weight  Loss : "poor appetite then with nausea; reported eating primarily saltine crackers for 3 weeks PTA. Intake during admission in October was 25-50% of meals. weight down 16 pounds since last inpatient RD visit on 08/15/13"        Treatment INTERVENTION:  - Resource Breeze TID  - Recommend MD monitor pt's potassium, phosphorus, and magnesium as PO intake improves as pt at high risk for refeeding syndrome  - Multivitamin 1 tablet PO daily Weights, labs, intake, palliative care goals   Nutrition Consult: noted on this admission 09/05/13 >meets criteria  for severe MALNUTRITION in the context of chronic illness as evidenced by 0% estimated energy intake for weeks per pt report with 8% weight loss in the past 2 weeks with 13.7% weight loss in the past month    You may use possible, probable, or suspect with inpatient documentation. possible, probable, suspected diagnoses MUST be documented at the time of discharge  Reviewed: MD documented in d/c summary --severe protein calorie malnutrition on 09/08/13 at 6:50am       (GTatum)  Thank You,  Andy Gauss  Clinical Documentation Specialist: [phone] Health Information Management [place]

## 2013-09-06 NOTE — Progress Notes (Signed)
Patient states she is nauseated and her spouse said she vomited, asked to let us see it if it occurs again.  Medicated per order

## 2013-09-06 NOTE — Progress Notes (Addendum)
Biliary drain bag changed, dressing changed, daughter and patient's husband observed this process.  Extra bag given to patient for future change.  Patient was assisted with bath, garment change, linens changed.

## 2013-09-06 NOTE — Evaluation (Signed)
Physical Therapy Evaluation Patient Details Name: Carolyn [name] MRN: [id number] DOB: 11-21-1935 Today's Date: 09/06/2013 Time: 9147-8295 PT Time Calculation (min): 25 min  PT Assessment / Plan / Recommendation History of Present Illness  77 yo female admitted with dehydration, weakness, fall. Hx of pancreatic cancer, biliary drain L.   Clinical Impression  On eval, pt required Min assist for mobility-able to ambulate ~60 feet with walker. Demonstrates general weakness and decreased activity tolerance. Recommend HHPT with 24 hour supervision as long as husband can provide care.     PT Assessment  Patient needs continued PT services    Follow Up Recommendations  Home health PT;Supervision/Assistance - 24 hour    Does the patient have the potential to tolerate intense rehabilitation      Barriers to Discharge        Equipment Recommendations  Rolling walker with 5" wheels    Recommendations for Other Services OT consult   Frequency Min 3X/week    Precautions / Restrictions Precautions Precautions: Fall Restrictions Weight Bearing Restrictions: No   Pertinent Vitals/Pain Abdomen "just got some medicine for it" -unrated      Mobility  Bed Mobility Bed Mobility: Supine to Sit;Sit to Supine Supine to Sit: 5: Supervision Sitting - Scoot to Edge of Bed: 5: Supervision Transfers Transfers: Sit to Stand;Stand to Sit Sit to Stand: 4: Min guard;From bed Stand to Sit: 4: Min guard;To bed Details for Transfer Assistance: VCS safety, technqiue, hand placemenet Ambulation/Gait Ambulation/Gait Assistance: 4: Min assist Ambulation Distance (Feet): 60 Feet Assistive device: Rolling walker Ambulation/Gait Assistance Details: Assist to stabilize throughout ambulation. slow gait speed. VCs safety, use of RW. Noted bil LE weakness/shakiness    Exercises     PT Diagnosis: Difficulty walking;Generalized weakness  PT Problem List: Decreased strength;Decreased activity  tolerance;Decreased balance;Decreased mobility;Pain;Decreased knowledge of use of DME PT Treatment Interventions: DME instruction;Gait training;Functional mobility training;Therapeutic activities;Therapeutic exercise;Patient/family education;Balance training     PT Goals(Current goals can be found in the care plan section) Acute Rehab PT Goals Patient Stated Goal: to get out of here PT Goal Formulation: With patient/family Time For Goal Achievement: 09/20/13 Potential to Achieve Goals: Fair  Visit Information  Last PT Received On: 09/06/13 Assistance Needed: +1 History of Present Illness: 77 yo female admitted with dehydration, weakness, fall. Hx of pancreatic cancer, biliary drain L.        Prior Functioning  Home Living Family/patient expects to be discharged to:: Private residence Living Arrangements: Spouse/significant other Available Help at Discharge: Family Type of Home: House Home Access: Stairs to enter Secretary/administrator of Steps: 5 Entrance Stairs-Rails: Right Home Layout: One level Home Equipment: None Prior Function Level of Independence: Independent Communication Communication: No difficulties    Cognition  Cognition Arousal/Alertness: Lethargic Behavior During Therapy: WFL for tasks assessed/performed Overall Cognitive Status: Within Functional Limits for tasks assessed    Extremity/Trunk Assessment Upper Extremity Assessment Upper Extremity Assessment: Generalized weakness Lower Extremity Assessment Lower Extremity Assessment: Generalized weakness Cervical / Trunk Assessment Cervical / Trunk Assessment: Normal   Balance    End of Session PT - End of Session Activity Tolerance: Patient limited by fatigue Patient left: in bed;with call bell/phone within reach;with family/visitor present  GP     Rebeca Alert, MPT Pager: [phone]

## 2013-09-06 NOTE — Consult Note (Signed)
#   [id number] is consult note.  Hewitt Shorts  Proverbs 3:5-6

## 2013-09-06 NOTE — Progress Notes (Signed)
Radiation Oncology         [phone] ________________________________  Name: Carolyn [name] MRN: [id number]  Date: 09/04/2013  DOB: 08/12/1936   Narrative:  The patient states that she is feeling better today.  I visited with her today as well as multiple family members. The patient is trying to decide how aggressive to be in terms of her management. She feels that her performance status has significantly declined. She has discussed this issue with Dr. Myna Hidalgo as well as her inpatient team, and also has discussed her overall care with palliative medicine.                  ALLERGIES:  is allergic to alka-seltzer.  Meds: Current Facility-Administered Medications  Medication Dose Route Frequency Provider Last Rate Last Dose  . 0.9 %  sodium chloride infusion   Intravenous Continuous Gwen Pounds, MD 50 mL/hr at 09/06/13 0531    . acetaminophen (TYLENOL) tablet 650 mg  650 mg Oral Q6H PRN Gwen Pounds, MD      . ALPRAZolam Prudy Feeler) tablet 0.25 mg  0.25 mg Oral TID Canary Brim, NP      . bisacodyl (DULCOLAX) EC tablet 5 mg  5 mg Oral Daily PRN Gwen Pounds, MD      . citalopram (CELEXA) tablet 20 mg  20 mg Oral Daily Josph Macho, MD   20 mg at 09/06/13 1029  . feeding supplement (RESOURCE BREEZE) (RESOURCE BREEZE) liquid 1 Container  1 Container Oral TID BM Lavena Bullion, RD   1 Container at 09/06/13 1502  . fentaNYL (DURAGESIC - dosed mcg/hr) 50 mcg  50 mcg Transdermal Q72H Gwen Pounds, MD   50 mcg at 09/06/13 0949  . HYDROmorphone (DILAUDID) injection 1 mg  1 mg Intravenous Q4H PRN Minda Meo, MD   1 mg at 09/06/13 0955  . lipase/protease/amylase (CREON-12/PANCREASE) capsule 2 capsule  2 capsule Oral TID AC Josph Macho, MD   2 capsule at 09/06/13 1712  . metoCLOPramide (REGLAN) tablet 5 mg  5 mg Oral TID AC Gwen Pounds, MD   5 mg at 09/06/13 1712  . morphine CONCENTRATE 10 mg / 0.5 ml oral solution 5 mg  5 mg Oral Q2H PRN Canary Brim, NP   5 mg at 09/06/13 1506  .  ondansetron (ZOFRAN) tablet 4 mg  4 mg Oral Q6H PRN Gwen Pounds, MD       Or  . ondansetron [facility]) injection 4 mg  4 mg Intravenous Q6H PRN Gwen Pounds, MD   4 mg at 09/06/13 0955  . pantoprazole (PROTONIX) EC tablet 40 mg  40 mg Oral Daily Gwen Pounds, MD   40 mg at 09/06/13 0948  . predniSONE (DELTASONE) tablet 10 mg  10 mg Oral Q breakfast Josph Macho, MD   10 mg at 09/06/13 1029    Physical Findings: The patient is in no acute distress. Patient is alert and oriented.  height is 5\' 3"  (1.6 m) and weight is 183 lb 15.9 oz (83.46 kg). Her oral temperature is 98.4 F (36.9 C). Her blood pressure is 157/55 and her pulse is 80. Her respiration is 18 and oxygen saturation is 94%. .     Lab Findings: Lab Results  Component Value Date   WBC 10.2 09/06/2013   HGB 9.9* 09/06/2013   HCT 30.0* 09/06/2013   MCV 82.2 09/06/2013   PLT 295 09/06/2013  Radiographic Findings: Ct Abdomen Pelvis Wo Contrast  08/14/2013   CLINICAL DATA:  Generalized abdominal pain. History of pancreatic cancer.  EXAM: CT ABDOMEN AND PELVIS WITHOUT CONTRAST  TECHNIQUE: Multidetector CT imaging of the abdomen and pelvis was performed following the standard protocol without intravenous contrast.  COMPARISON:  CT abdomen pelvis 06/24/2013; 07/23/2013.  FINDINGS: Small right pleural effusion. Minimal dependent atelectasis within the left greater than right lower lobes. Normal heart size. There is a 3.7 cm thoracic aortic aneurysm.  Lack of intravenous contrast material limits evaluation of the solid organ parenchyma.  Multiple hepatic cysts, re- demonstrated. Percutaneous biliary drainage catheter courses through the left hepatic lobe with the distal tip coiled in the duodenum. There has been interval dilation of the her intrahepatic and extrahepatic biliary ductal system. Spleen is grossly unremarkable. Re- demonstrated marked dilatation of the main pancreatic duct measuring up to 1.4 cm. Known pancreatic head mass is  difficult to delineate without intravenous contrast material.  Bilateral adrenal glands are unremarkable. Kidneys are symmetric in size. No hydronephrosis. Unchanged cyst within the superior pole of the left kidney. An partially exophytic cyst within the interpolar region of the right kidney.  Extensive calcification of the normal caliber abdominal aorta. No retroperitoneal lymphadenopathy. Urinary bladder is unremarkable. Descending and sigmoid colonic diverticulosis. No evidence for acute diverticulitis.  Normal appendix. No free fluid or free intraperitoneal air.  If lower lumbar spine degenerative change. No aggressive osseous lesions.  IMPRESSION: 1. Interval development of extensive intrahepatic and extrahepatic biliary ductal dilatation when compared to the recent prior examination, raising the possibility of percutaneous biliary drainage failure. Recommend correlation with laboratory analysis and possible ultrasound as clinically indicated as lack of intravenous contrast material somewhat limits evaluation. 2. Pancreatic head mass is not well delineated due to lack of intravenous contrast material. Persistent marked dilation of the pancreatic duct. 3. Probable multiple hepatic cysts. 4. Small right pleural effusion. 5. Distal thoracic aortic aneurysm.   Electronically Signed   By: Annia Belt M.D.   On: 08/14/2013 16:36   Ct Head Wo Contrast  09/04/2013   CLINICAL DATA:  Posterior head and neck pain after falling.  EXAM: CT HEAD WITHOUT CONTRAST  CT CERVICAL SPINE WITHOUT CONTRAST  TECHNIQUE: Multidetector CT imaging of the head and cervical spine was performed following the standard protocol without intravenous contrast. Multiplanar CT image reconstructions of the cervical spine were also generated.  COMPARISON:  Cervical spine radiographs 05/13/2006. Chest CT 07/07/2013.  FINDINGS: CT HEAD FINDINGS  There is no evidence of acute intracranial hemorrhage, mass lesion, brain edema or extra-axial fluid  collection. The ventricles and subarachnoid spaces are appropriately sized for age. There is no CT evidence of acute cortical infarction.  The visualized paranasal sinuses, mastoid air cells and middle ears are clear. The calvarium is intact.  CT CERVICAL SPINE FINDINGS  The alignment is stable. There is no evidence acute fracture or traumatic subluxation. There are multilevel surgical changes status post C3 through C7 posterior decompression and fusion. There is an anterior plate and screws at C4-5 and bilateral pedicle screws from C3 through C7. No hardware loosening is identified. The interbody fusion appears solid. There is disc degeneration with endplate degeneration at C7-T1.  Heterogeneous enlargement of the thyroid gland appears grossly stable. There are scattered vascular calcifications. No acute soft tissue findings are evident.  IMPRESSION: 1. No acute intracranial or calvarial findings. 2. No evidence of acute cervical spine fracture, traumatic subluxation or static signs of instability. 3. Grossly stable appearance  of the cervical spine status post laminectomy and fusion. No hardware loosening identified.   Electronically Signed   By: Roxy Horseman M.D.   On: 09/04/2013 17:25   Ct Cervical Spine Wo Contrast  09/04/2013   CLINICAL DATA:  Posterior head and neck pain after falling.  EXAM: CT HEAD WITHOUT CONTRAST  CT CERVICAL SPINE WITHOUT CONTRAST  TECHNIQUE: Multidetector CT imaging of the head and cervical spine was performed following the standard protocol without intravenous contrast. Multiplanar CT image reconstructions of the cervical spine were also generated.  COMPARISON:  Cervical spine radiographs 05/13/2006. Chest CT 07/07/2013.  FINDINGS: CT HEAD FINDINGS  There is no evidence of acute intracranial hemorrhage, mass lesion, brain edema or extra-axial fluid collection. The ventricles and subarachnoid spaces are appropriately sized for age. There is no CT evidence of acute cortical infarction.   The visualized paranasal sinuses, mastoid air cells and middle ears are clear. The calvarium is intact.  CT CERVICAL SPINE FINDINGS  The alignment is stable. There is no evidence acute fracture or traumatic subluxation. There are multilevel surgical changes status post C3 through C7 posterior decompression and fusion. There is an anterior plate and screws at C4-5 and bilateral pedicle screws from C3 through C7. No hardware loosening is identified. The interbody fusion appears solid. There is disc degeneration with endplate degeneration at C7-T1.  Heterogeneous enlargement of the thyroid gland appears grossly stable. There are scattered vascular calcifications. No acute soft tissue findings are evident.  IMPRESSION: 1. No acute intracranial or calvarial findings. 2. No evidence of acute cervical spine fracture, traumatic subluxation or static signs of instability. 3. Grossly stable appearance of the cervical spine status post laminectomy and fusion. No hardware loosening identified.   Electronically Signed   By: Roxy Horseman M.D.   On: 09/04/2013 17:25    Impression/Plan:    I had a long discussion with her and her family today. She has not tolerated radiation treatment well and my recommendation is to discontinue radiation. Dr.Ennever has also recommended discontinuing chemotherapy. I reviewed with her the original plan which was to proceed with neoadjuvant chemoradiotherapy followed by surgery. I reiterated that the only definitive treatment for this associated with long-term control would be surgery. She is scheduled to see Dr. Donell Beers this Friday.  During my discussion with the patient, the family seems to be somewhat divided in terms of what they would like. I believe they are aware that the patient ultimately will make a decision in terms of her personal goals for treatment. I believe that the patient's family is coming to terms with the difficulties experienced during treatment and her poor prognosis. The  patient's husband in particular expressed an interest however in proceeding with more aggressive treatment if feasible.  I am certainly willing to reconsider radiation treatment if the patient's performance status improves, she ultimately decides to proceed with aggressive treatment (which I do not believe was the conclusion from multiple conversations she has had with caregivers today), and if Dr. Donell Beers feels that this would be helpful in preparation for surgical resection. Further radiation treatment alone, in the absence of either chemotherapy or surgery, would have a very limited benefit especially considering how she is tolerating treatment. This conclusion was discussed in detail.   Radene Gunning, M.D., Ph.D.

## 2013-09-06 NOTE — Consult Note (Signed)
Patient GN:FAOZH [name]      DOB: [birth year]      YQM:[id number]     Consult Note from the Palliative Medicine Team at [facility]    Consult Requested by: Dr Carolyn [name]     PCP: Carolyn Pounds, MD Reason for Consultation:Clarification of GOC and options     Phone [phone]  Assessment of patients Current state: 77 yo with recently diagnosed pancreatic cancer, s/p chemo,  and radiation is recommended to be stopped/put on hold per Dr Carolyn [name] and Dr Carolyn [name].  Over all poor functional and physical status, limited po intake with weight loss.  All understand the seriousness of the diagnosis but remain hopeful for more "quality time"   Consult is for review of medical treatment options, clarification of goals of care and end of life issues, disposition and options, and symptom recommendation.  This NP Carolyn [name] reviewed medical records, received report from team, assessed the patient and then meet at the patient's bedside along with her husband Carolyn [name], daughter Carolyn [name] and brother Carolyn [name] to discuss diagnosis prognosis, GOC, EOL wishes disposition and options.   A detailed discussion was had today regarding advanced directives.  Concepts specific to code status, artifical feeding and hydration, continued IV antibiotics and rehospitalization was had.  The difference between a aggressive medical intervention path  and a palliative comfort care path for this patient at this time was had.  Values and goals of care important to patient and family were attempted to be elicited.  Concept of Hospice and Palliative Care were discussed  Natural trajectory and expectations at EOL were discussed.  Questions and concerns addressed.  Hard Choices booklet left for review. Family encouraged to call with questions or concerns.  PMT will continue to support holistically.      Goals of Care: 1.  Code Status: DNR/DNI   2. Scope of Treatment: 1. Vital Signs:per unit 2. Respiratory/Oxygen:as  needed for comfort 3. Nutritional Support/Tube Feeds: no artificial feeding now or in the future 4. Antibiotics: consider use if infection occurs 5. IVF: consider need when event occurs 6. Review of Medications to be discontinued:  Continue with present medciations 7. Labs: until dc 8. Consults:  Spoke with Dr Carolyn [name], family is hopeful to talk with him regarding recommendation to discontinue radiation at this time by both Dr Carolyn [name] and Dr Carolyn [name]   4. Disposition:  Hopeful for home with hospice, will write for choice   3. Symptom Management:   1. Anxiety/Agitation: Xanax 0.25 mg TID 2. Pain:  Continue Fentanyl patch/ add Roxanol 5 mg SL every 2 hrs prn   4. Psychosocial:  Emotional support offered to patient and family, she is tearful but able to verbalize an understanding of the overall poor prognosis and  her wishes for herself and family at this time.  Strong support of family and friends.  5. Spiritual:  Strong community church support    Brief NUU:VOZDG Carolyn [name] is a 77 y.o. female with pancreatic cancer who has been on chemo and radiation prior to admit and has an external biliary drain who was admitted on 08/14/13 for worsening abdominal pain, anorexia, and nausea. UGIS on 07/20/13 negative for obstruction. The chemo has since been stopped.  She has upper quadrant pain that varies in intensity and quality, presently controlled on current meds. Reports no appetite at all. Increased intrahepatic and extrahepatic biliary duct dilation on recent CT. Faced with limited options, currently not a surgical candidate and chemo/radiation has been stopped due  to poor functional and physical status     ROS:  intermitant abdominal pain, anxiety and "worry about the situation"    PMH:  Past Medical History  Diagnosis Date  . Hypertension   . Hyperlipidemia   . GERD (gastroesophageal reflux disease)   . Asthma   . Obstructive jaundice 06/2013  . Pancreatitis   . Arthritis   . Cancer    . Heart murmur   . Thyroid disease   . Pancreatic cancer 07/05/13    pancreatic head=adenocarcinoma     PSH: Past Surgical History  Procedure Laterality Date  . Neck surgery  2005/2007  . Hand surgery  2010  . Cataract extraction    . Abdominal hysterectomy    . Eus N/A 07/05/2013    Procedure: ESOPHAGEAL ENDOSCOPIC ULTRASOUND (EUS) RADIAL;  Surgeon: Carolyn Modena, MD;  Location: [facility];  Service: Endoscopy;  Laterality: N/A;  . Ercp N/A 07/05/2013    Procedure: ENDOSCOPIC RETROGRADE CHOLANGIOPANCREATOGRAPHY (ERCP);  Surgeon: Carolyn Modena, MD;  Location: [facility];  Service: Endoscopy;  Laterality: N/A;  . Buttock cyst removal    . Cholecystectomy     I have reviewed the FH and SH and  If appropriate update it with new information. Allergies  Allergen Reactions  . Alka-Seltzer [Aspirin Effervescent] Swelling   Scheduled Meds: . citalopram  20 mg Oral Daily  . feeding supplement (RESOURCE BREEZE)  1 Container Oral TID BM  . fentaNYL  50 mcg Transdermal Q72H  . lipase/protease/amylase  2 capsule Oral TID AC  . metoCLOPramide  5 mg Oral TID AC  . pantoprazole  40 mg Oral Daily  . predniSONE  10 mg Oral Q breakfast   Continuous Infusions: . sodium chloride 50 mL/hr at 09/06/13 0531   PRN Meds:.acetaminophen, ALPRAZolam, bisacodyl, HYDROmorphone (DILAUDID) injection, ondansetron (ZOFRAN) IV, ondansetron    BP 164/66  Pulse 82  Temp(Src) 98.5 F (36.9 C) (Oral)  Resp 18  Ht 5\' 3"  (1.6 m)  Wt 83.46 kg (183 lb 15.9 oz)  BMI 32.60 kg/m2  SpO2 97%   PPS:40 %   Intake/Output Summary (Last 24 hours) at 09/06/13 1252 Last data filed at 09/06/13 0640  Gross per 24 hour  Intake 1081.67 ml  Output    250 ml  Net 831.67 ml     Physical Exam:  General: ill appearing, NAD HEENT:  Mm, no exudate Chest:   CTA CVS: RRR Abdomen: soft NT +BS, noted drainage tube, bag with large amt of bile like fluid Ext:  withoutedema Neuro:alertand oriented and engaged in  GOC conversation  Labs: CBC    Component Value Date/Time   WBC 10.2 09/06/2013 0815   WBC 12.5* 08/25/2013 1029   RBC 3.65* 09/06/2013 0815   HGB 9.9* 09/06/2013 0815   HGB 10.9* 08/25/2013 1029   HCT 30.0* 09/06/2013 0815   HCT 34.2* 08/25/2013 1029   PLT 295 09/06/2013 0815   PLT 357 08/25/2013 1029   MCV 82.2 09/06/2013 0815   MCV 84 08/25/2013 1029   MCH 27.1 09/06/2013 0815   MCH 26.7 08/25/2013 1029   MCHC 33.0 09/06/2013 0815   MCHC 31.9* 08/25/2013 1029   RDW 15.3 09/06/2013 0815   RDW 15.7 08/25/2013 1029   LYMPHSABS 1.3 08/25/2013 1029   LYMPHSABS 2.9 07/23/2013 1700   MONOABS 1.2* 07/23/2013 1700   EOSABS 0.5 08/25/2013 1029   EOSABS 0.0 07/23/2013 1700   BASOSABS 0.0 08/25/2013 1029   BASOSABS 0.0 07/23/2013 1700    BMET  Component Value Date/Time   NA 129* 09/06/2013 0815   NA 134 08/25/2013 1029   K 3.4* 09/06/2013 0815   K 3.6 08/25/2013 1029   CL 99 09/06/2013 0815   CL 103 08/25/2013 1029   CO2 20 09/06/2013 0815   CO2 23 08/25/2013 1029   GLUCOSE 116* 09/06/2013 0815   GLUCOSE 144* 08/25/2013 1029   BUN 33* 09/06/2013 0815   BUN 21 08/25/2013 1029   CREATININE 1.05 09/06/2013 0815   CREATININE 1.1 08/25/2013 1029   CALCIUM 9.3 09/06/2013 0815   CALCIUM 9.6 08/25/2013 1029   GFRNONAA 51* 09/06/2013 0815   GFRAA 59* 09/06/2013 0815    CMP     Component Value Date/Time   NA 129* 09/06/2013 0815   NA 134 08/25/2013 1029   K 3.4* 09/06/2013 0815   K 3.6 08/25/2013 1029   CL 99 09/06/2013 0815   CL 103 08/25/2013 1029   CO2 20 09/06/2013 0815   CO2 23 08/25/2013 1029   GLUCOSE 116* 09/06/2013 0815   GLUCOSE 144* 08/25/2013 1029   BUN 33* 09/06/2013 0815   BUN 21 08/25/2013 1029   CREATININE 1.05 09/06/2013 0815   CREATININE 1.1 08/25/2013 1029   CALCIUM 9.3 09/06/2013 0815   CALCIUM 9.6 08/25/2013 1029   PROT 6.9 09/06/2013 0815   PROT 8.7* 08/25/2013 1029   ALBUMIN 2.4* 09/06/2013 0815   AST 42* 09/06/2013 0815   AST 43* 08/25/2013 1029   ALT 53*  09/06/2013 0815   ALT 34 08/25/2013 1029   ALKPHOS 100 09/06/2013 0815   ALKPHOS 133* 08/25/2013 1029   BILITOT 0.4 09/06/2013 0815   BILITOT 0.90 08/25/2013 1029   GFRNONAA 51* 09/06/2013 0815   GFRAA 59* 09/06/2013 0815      Time In Time Out Total Time Spent with Patient Total Overall Time  1130 1300 80 min 90 min    Greater than 50%  of this time was spent counseling and coordinating care related to the above assessment and plan.  Carolyn Creed NP  Palliative Medicine Team Team Phone # [phone] Pager [phone]  Discussed with Dr Neale Burly, Dr Carolyn [name] and Dr Carolyn [name]

## 2013-09-06 NOTE — Progress Notes (Signed)
Patient HQ:IONGE BUNNY KLEIST      DOB: 17-Oct-1935      XBM:[id number]  Attempted to see patient times two.  Initial spouse not at hospital and upon my and his return patient refused conference.  All agreeable to 1130 Am meeting tomorrow.  Dr. Mitzi Hansen out of office this afternoon.  Will ask team to update him and Rad onc who still have patient on schedule.  Rian Busche L. Ladona Ridgel, MD MBA The Palliative Medicine Team at [facility] Phone: [phone] Pager: [phone]

## 2013-09-06 NOTE — Progress Notes (Signed)
Late Entry: 4:45 pm Notified by Benay Pillow, patient family request services of [facility]) after discharge; spoke briefly with patient, husband Gabriel Rung and daughter, Burna Mortimer at bedside they confirm decision to stop radiation/treatments and request for hospice services at home, family agreeable to this writer speaking again with them in the morning to discuss/finalize needs. Will notify [facility] to begin processing information.  Will follow up tomorrow. Thank you.  Valente David, RN 09/06/2013, 8:23 PM [facility] RN Liaison [phone]

## 2013-09-06 NOTE — Progress Notes (Addendum)
Subjective: Follow up Pancreatic CA There are no great options left. Hospice/Paliative Care to see and get set up today. I believe stopping XRT is correct option Appreciate Dr Tama Gander help. Long discussion c family this am. Supposedly she ate well and she is asking for breakfast  Objective: Vital signs in last 24 hours: Temp:  [98.2 F (36.8 C)-98.5 F (36.9 C)] 98.5 F (36.9 C) (12/03 0600) Pulse Rate:  [79-86] 82 (12/03 0600) Resp:  [18] 18 (12/03 0600) BP: (164-191)/(55-74) 164/66 mmHg (12/03 0600) SpO2:  [93 %-98 %] 97 % (12/03 0600) Weight change:  Last BM Date: 09/04/13  CBG (last 3)  No results found for this basename: GLUCAP,  in the last 72 hours  Intake/Output from previous day:  Intake/Output Summary (Last 24 hours) at 09/06/13 0742 Last data filed at 09/06/13 0640  Gross per 24 hour  Intake 1116.67 ml  Output    500 ml  Net 616.67 ml   12/02 0701 - 12/03 0700 In: 1116.7 [P.O.:70; I.V.:1046.7] Out: 500 [Urine:100; Drains:400]   Physical Exam  General appearance: A and O.  Weak and frail. Eyes: no scleral icterus Throat: oropharynx moist without erythema Resp: CTA Cardio: Reg c m GI: soft, non-tender; bowel sounds normal; no masses,  no organomegaly.  Biliary drainage in place Extremities: no clubbing, cyanosis or edema   Lab Results:  Recent Labs  09/04/13 1717 09/04/13 1852  NA PATIENT IDENTIFICATION ERROR. PLEASE DISREGARD RESULTS. ACCOUNT WILL BE CREDITED. 126*  K PATIENT IDENTIFICATION ERROR. PLEASE DISREGARD RESULTS. ACCOUNT WILL BE CREDITED. 3.4*  CL PATIENT IDENTIFICATION ERROR. PLEASE DISREGARD RESULTS. ACCOUNT WILL BE CREDITED. 91*  CO2 PATIENT IDENTIFICATION ERROR. PLEASE DISREGARD RESULTS. ACCOUNT WILL BE CREDITED. 19  GLUCOSE PATIENT IDENTIFICATION ERROR. PLEASE DISREGARD RESULTS. ACCOUNT WILL BE CREDITED. 138*  BUN PATIENT IDENTIFICATION ERROR. PLEASE DISREGARD RESULTS. ACCOUNT WILL BE CREDITED. 54*  CREATININE PATIENT  IDENTIFICATION ERROR. PLEASE DISREGARD RESULTS. ACCOUNT WILL BE CREDITED. 2.21*  CALCIUM PATIENT IDENTIFICATION ERROR. PLEASE DISREGARD RESULTS. ACCOUNT WILL BE CREDITED. 10.8*     Recent Labs  09/04/13 1717  AST PATIENT IDENTIFICATION ERROR. PLEASE DISREGARD RESULTS. ACCOUNT WILL BE CREDITED.  ALT PATIENT IDENTIFICATION ERROR. PLEASE DISREGARD RESULTS. ACCOUNT WILL BE CREDITED.  ALKPHOS PATIENT IDENTIFICATION ERROR. PLEASE DISREGARD RESULTS. ACCOUNT WILL BE CREDITED.  BILITOT PATIENT IDENTIFICATION ERROR. PLEASE DISREGARD RESULTS. ACCOUNT WILL BE CREDITED.  PROT PATIENT IDENTIFICATION ERROR. PLEASE DISREGARD RESULTS. ACCOUNT WILL BE CREDITED.  ALBUMIN PATIENT IDENTIFICATION ERROR. PLEASE DISREGARD RESULTS. ACCOUNT WILL BE CREDITED.     Recent Labs  09/04/13 1717 09/04/13 1852  WBC PATIENT IDENTIFICATION ERROR. PLEASE DISREGARD RESULTS. ACCOUNT WILL BE CREDITED. 15.9*  HGB PATIENT IDENTIFICATION ERROR. PLEASE DISREGARD RESULTS. ACCOUNT WILL BE CREDITED. 12.8  HCT PATIENT IDENTIFICATION ERROR. PLEASE DISREGARD RESULTS. ACCOUNT WILL BE CREDITED. 37.4  MCV PATIENT IDENTIFICATION ERROR. PLEASE DISREGARD RESULTS. ACCOUNT WILL BE CREDITED. 81.0  PLT PATIENT IDENTIFICATION ERROR. PLEASE DISREGARD RESULTS. ACCOUNT WILL BE CREDITED. 400    Lab Results  Component Value Date   INR 1.24 07/24/2013   INR 0.94 07/02/2013     Recent Labs  09/04/13 1717 09/04/13 1852  TROPONINI PATIENT IDENTIFICATION ERROR. PLEASE DISREGARD RESULTS. ACCOUNT WILL BE CREDITED. <0.30    No results found for this basename: TSH, T4TOTAL, FREET3, T3FREE, THYROIDAB,  in the last 72 hours  No results found for this basename: VITAMINB12, FOLATE, FERRITIN, TIBC, IRON, RETICCTPCT,  in the last 72 hours  Micro Results: No results found for this or any previous visit (from the past  240 hour(s)).   Studies/Results: Ct Head Wo Contrast  09/04/2013   CLINICAL DATA:  Posterior head and neck pain after falling.   EXAM: CT HEAD WITHOUT CONTRAST  CT CERVICAL SPINE WITHOUT CONTRAST  TECHNIQUE: Multidetector CT imaging of the head and cervical spine was performed following the standard protocol without intravenous contrast. Multiplanar CT image reconstructions of the cervical spine were also generated.  COMPARISON:  Cervical spine radiographs 05/13/2006. Chest CT 07/07/2013.  FINDINGS: CT HEAD FINDINGS  There is no evidence of acute intracranial hemorrhage, mass lesion, brain edema or extra-axial fluid collection. The ventricles and subarachnoid spaces are appropriately sized for age. There is no CT evidence of acute cortical infarction.  The visualized paranasal sinuses, mastoid air cells and middle ears are clear. The calvarium is intact.  CT CERVICAL SPINE FINDINGS  The alignment is stable. There is no evidence acute fracture or traumatic subluxation. There are multilevel surgical changes status post C3 through C7 posterior decompression and fusion. There is an anterior plate and screws at C4-5 and bilateral pedicle screws from C3 through C7. No hardware loosening is identified. The interbody fusion appears solid. There is disc degeneration with endplate degeneration at C7-T1.  Heterogeneous enlargement of the thyroid gland appears grossly stable. There are scattered vascular calcifications. No acute soft tissue findings are evident.  IMPRESSION: 1. No acute intracranial or calvarial findings. 2. No evidence of acute cervical spine fracture, traumatic subluxation or static signs of instability. 3. Grossly stable appearance of the cervical spine status post laminectomy and fusion. No hardware loosening identified.   Electronically Signed   By: Roxy Horseman M.D.   On: 09/04/2013 17:25   Ct Cervical Spine Wo Contrast  09/04/2013   CLINICAL DATA:  Posterior head and neck pain after falling.  EXAM: CT HEAD WITHOUT CONTRAST  CT CERVICAL SPINE WITHOUT CONTRAST  TECHNIQUE: Multidetector CT imaging of the head and cervical spine  was performed following the standard protocol without intravenous contrast. Multiplanar CT image reconstructions of the cervical spine were also generated.  COMPARISON:  Cervical spine radiographs 05/13/2006. Chest CT 07/07/2013.  FINDINGS: CT HEAD FINDINGS  There is no evidence of acute intracranial hemorrhage, mass lesion, brain edema or extra-axial fluid collection. The ventricles and subarachnoid spaces are appropriately sized for age. There is no CT evidence of acute cortical infarction.  The visualized paranasal sinuses, mastoid air cells and middle ears are clear. The calvarium is intact.  CT CERVICAL SPINE FINDINGS  The alignment is stable. There is no evidence acute fracture or traumatic subluxation. There are multilevel surgical changes status post C3 through C7 posterior decompression and fusion. There is an anterior plate and screws at C4-5 and bilateral pedicle screws from C3 through C7. No hardware loosening is identified. The interbody fusion appears solid. There is disc degeneration with endplate degeneration at C7-T1.  Heterogeneous enlargement of the thyroid gland appears grossly stable. There are scattered vascular calcifications. No acute soft tissue findings are evident.  IMPRESSION: 1. No acute intracranial or calvarial findings. 2. No evidence of acute cervical spine fracture, traumatic subluxation or static signs of instability. 3. Grossly stable appearance of the cervical spine status post laminectomy and fusion. No hardware loosening identified.   Electronically Signed   By: Roxy Horseman M.D.   On: 09/04/2013 17:25     Medications: Scheduled: . feeding supplement (RESOURCE BREEZE)  1 Container Oral TID BM  . fentaNYL  50 mcg Transdermal Q72H  . metoCLOPramide  5 mg Oral TID AC  .  pantoprazole  40 mg Oral Daily   Continuous: . sodium chloride 50 mL/hr at 09/06/13 0531     Assessment/Plan: Active Problems:   Dehydration   Syncope  Pancreatic CA - Chemo stopped. DNR. XRT to  be stopped. She and family are mentally ready now to accept giving up on aggressive care and pursuing Hospice palliative care measures only with some caveats.  We discussed when to stop IVF, when to clamp the biliary drain or leave flowing, whether we would ever re-admit for [facility] again.  We discussed what Dr Myna Hidalgo discussed c them.  His not is still being transcribed.  The family reported that Dr Myna Hidalgo was more optimistic than I am.  He ordered labs today.  He mentioned calling GI back to do the celiac block.  They said that he stated that he agreed that all modalities would be stopped and hospice pursued but left the door open to future scanning and reassessments.  She reports pain is mostly controlled on current meds.  Celexa/Prednisone and Pancreatic enzymes started per Dr E.  Obstructive jaundice - Biliary drain c Output> Input. Hydration will be an issue once the IVF is turned off.  CA induced Anorexia and Significant Protein Calorie malnutrition - No PEG - as I believe it will just stall the inevitable.  W  Pain and Anxiety - meds being provided.   Hypertensive - I have her off many of her prior anti-hypertensive meds after her admission for ARF c Hypotension. No reason to pursue.   Dizzy/DeH/Syncope/hyponatremia/ARF c Cr 2.21 - getting fluids and IVF hydration. I did not order repeat labs. Continue on 50 cc's per hr for now. Will Hep lock her and not restart any external fluids after hospice sees her and everyone is on the same page.   DVT Prophylaxis - none need    LOS: 2 days   Jennifier Smitherman M 09/06/2013, 7:42 AM

## 2013-09-06 NOTE — Progress Notes (Signed)
MD contacted regarding patients c/o painful IV site.  MD stated it's ok to removed IV access considering patients potential discharge tomorrow.

## 2013-09-07 ENCOUNTER — Ambulatory Visit: Payer: Medicare Other

## 2013-09-07 MED ORDER — PIPERACILLIN-TAZOBACTAM 3.375 G IVPB
3.3750 g | INTRAVENOUS | Status: AC
  Administered 2013-09-08: 3.375 g via INTRAVENOUS
  Filled 2013-09-07 (×2): qty 50

## 2013-09-07 MED ORDER — VERAPAMIL HCL 40 MG PO TABS
40.0000 mg | ORAL_TABLET | Freq: Once | ORAL | Status: AC
  Administered 2013-09-07: 40 mg via ORAL
  Filled 2013-09-07: qty 1

## 2013-09-07 MED ORDER — MORPHINE SULFATE (CONCENTRATE) 10 MG /0.5 ML PO SOLN: 5.0000 mg | ORAL | Status: DC | PRN

## 2013-09-07 MED ORDER — CITALOPRAM HYDROBROMIDE 20 MG PO TABS: 20.0000 mg | ORAL_TABLET | Freq: Every day | ORAL | Status: DC

## 2013-09-07 MED ORDER — BOOST / RESOURCE BREEZE PO LIQD: 1.0000 | Freq: Three times a day (TID) | ORAL | Status: DC

## 2013-09-07 MED ORDER — PREDNISONE 10 MG PO TABS: 10.0000 mg | ORAL_TABLET | Freq: Every day | ORAL | Status: DC

## 2013-09-07 MED ORDER — ONDANSETRON 4 MG PO TBDP
4.0000 mg | ORAL_TABLET | Freq: Four times a day (QID) | ORAL | Status: DC | PRN
  Filled 2013-09-07: qty 1

## 2013-09-07 NOTE — Progress Notes (Signed)
Agree 

## 2013-09-07 NOTE — Progress Notes (Signed)
Progress Note from the Palliative Medicine Team at [facility]  Subjective: Patient is alert and oriented, sitting up eating breakfast, daughter at bedside   -continued conversation f/u yesterdays GOC meeting   Objective: Allergies  Allergen Reactions  . Alka-Seltzer [Aspirin Effervescent] Swelling   Scheduled Meds: . ALPRAZolam  0.25 mg Oral TID  . citalopram  20 mg Oral Daily  . feeding supplement (RESOURCE BREEZE)  1 Container Oral TID BM  . fentaNYL  50 mcg Transdermal Q72H  . lipase/protease/amylase  2 capsule Oral TID AC  . metoCLOPramide  5 mg Oral TID AC  . pantoprazole  40 mg Oral Daily  . predniSONE  10 mg Oral Q breakfast   Continuous Infusions:  PRN Meds:.acetaminophen, bisacodyl, HYDROmorphone (DILAUDID) injection, morphine CONCENTRATE, ondansetron (ZOFRAN) IV, ondansetron, ondansetron  BP 150/79  Pulse 80  Temp(Src) 98 F (36.7 C) (Oral)  Resp 18  Ht 5\' 3"  (1.6 m)  Wt 83.46 kg (183 lb 15.9 oz)  BMI 32.60 kg/m2  SpO2 98%   PPS:30 %  Pain Score o/c  Voiced presently    Intake/Output Summary (Last 24 hours) at 09/07/13 1324 Last data filed at 09/07/13 1306  Gross per 24 hour  Intake 1319.67 ml  Output    590 ml  Net 729.67 ml      Physical Exam:  General: ill appearing, NAD  weak HEENT: Mm, no exudate  Chest: CTA  CVS: RRR  Abdomen: soft NT +BS, noted drainage tube, bag with large amt of bile like fluid  Ext: without edema  Neuro:alertand oriented and engaged in GOC conversation  Labs: CBC    Component Value Date/Time   WBC 10.2 09/06/2013 0815   WBC 12.5* 08/25/2013 1029   RBC 3.65* 09/06/2013 0815   HGB 9.9* 09/06/2013 0815   HGB 10.9* 08/25/2013 1029   HCT 30.0* 09/06/2013 0815   HCT 34.2* 08/25/2013 1029   PLT 295 09/06/2013 0815   PLT 357 08/25/2013 1029   MCV 82.2 09/06/2013 0815   MCV 84 08/25/2013 1029   MCH 27.1 09/06/2013 0815   MCH 26.7 08/25/2013 1029   MCHC 33.0 09/06/2013 0815   MCHC 31.9* 08/25/2013 1029   RDW 15.3 09/06/2013  0815   RDW 15.7 08/25/2013 1029   LYMPHSABS 1.3 08/25/2013 1029   LYMPHSABS 2.9 07/23/2013 1700   MONOABS 1.2* 07/23/2013 1700   EOSABS 0.5 08/25/2013 1029   EOSABS 0.0 07/23/2013 1700   BASOSABS 0.0 08/25/2013 1029   BASOSABS 0.0 07/23/2013 1700    BMET    Component Value Date/Time   NA 129* 09/06/2013 0815   NA 134 08/25/2013 1029   K 3.4* 09/06/2013 0815   K 3.6 08/25/2013 1029   CL 99 09/06/2013 0815   CL 103 08/25/2013 1029   CO2 20 09/06/2013 0815   CO2 23 08/25/2013 1029   GLUCOSE 116* 09/06/2013 0815   GLUCOSE 144* 08/25/2013 1029   BUN 33* 09/06/2013 0815   BUN 21 08/25/2013 1029   CREATININE 1.05 09/06/2013 0815   CREATININE 1.1 08/25/2013 1029   CALCIUM 9.3 09/06/2013 0815   CALCIUM 9.6 08/25/2013 1029   GFRNONAA 51* 09/06/2013 0815   GFRAA 59* 09/06/2013 0815    CMP     Component Value Date/Time   NA 129* 09/06/2013 0815   NA 134 08/25/2013 1029   K 3.4* 09/06/2013 0815   K 3.6 08/25/2013 1029   CL 99 09/06/2013 0815   CL 103 08/25/2013 1029   CO2 20 09/06/2013 0815  CO2 23 08/25/2013 1029   GLUCOSE 116* 09/06/2013 0815   GLUCOSE 144* 08/25/2013 1029   BUN 33* 09/06/2013 0815   BUN 21 08/25/2013 1029   CREATININE 1.05 09/06/2013 0815   CREATININE 1.1 08/25/2013 1029   CALCIUM 9.3 09/06/2013 0815   CALCIUM 9.6 08/25/2013 1029   PROT 6.9 09/06/2013 0815   PROT 8.7* 08/25/2013 1029   ALBUMIN 2.4* 09/06/2013 0815   AST 42* 09/06/2013 0815   AST 43* 08/25/2013 1029   ALT 53* 09/06/2013 0815   ALT 34 08/25/2013 1029   ALKPHOS 100 09/06/2013 0815   ALKPHOS 133* 08/25/2013 1029   BILITOT 0.4 09/06/2013 0815   BILITOT 0.90 08/25/2013 1029   GFRNONAA 51* 09/06/2013 0815   GFRAA 59* 09/06/2013 0815          Assessment and Plan: 1. Code Status: DNR/DNI 2. Symptom Control: 1. Anxiety/Agitation: Xanax 0.25 mg TID (feels better with this change) 2. Pain: Continue Fentanyl patch/ add Roxanol 5 mg SL every 2 hrs prn    3. Psycho/Social:  Emotional support offered to  patient and daughter 4. Spiritual  [employer] support 5. Disposition:  Home with hospice services  Patient Documents Completed or Given: Document Given Completed  Advanced Directives Pkt    MOST    DNR    Gone from My [facility]ight    Hard Choices yes      Lorinda Creed NP  Palliative Medicine Team Team Phone # [phone] Pager [phone]   1

## 2013-09-07 NOTE — Progress Notes (Signed)
Follow up: plan for discharge home tomorrow with [facility] to follow after discharge.  Patient information reviewed with Dr Elliot Gurney, [facility] Medical Director hospice eligible with dx: pancreatic cancer. Spoke last evening with patient and husband Gabriel Rung and daughter Burna Mortimer and again this morning with patient and daughter Burna Mortimer to initiate education related to hospice services, philosophy and team approach to care- all voiced good understanding of information provided. Per discussion plan is for discharge possibly tomorrow, Friday 12/5 by personal vehicle  Please send completed GOLD DNR form home with discharge paperwork DME needs discussed with patient who requests a walker with wheels, 3n1 BSC and a quad cane- and patient's husband has received these and will take home; at this time patient wants to wait on ordering any additional DME and discuss needs and/or hospital bed once she is at home  Patient's PCP Dr Timothy Lasso will be attending physician with [facility]; pharmacy [facility] [address] Initial paperwork faxed to [facility]  Completed d/c summary will need to be faxed to [facility] @ [phone] when final Please notify [facility] when patient is ready to leave unit at d/c call [phone] (or [phone] if after 5 pm);  [facility] information and contact numbers also given to daughter and husband during visit.   Above information shared with [facility] Please call with any questions or concerns   Valente David, RN 09/07/2013, 2:51 PM [facility] Liaison [phone]

## 2013-09-07 NOTE — Progress Notes (Signed)
Patient ID: Carolyn [name], female   DOB: 1936-04-10, 77 y.o.   MRN: [id number] Request received from [facility] for attempt at biliary stent placement in pt with hx of pancreatic carcinoma, existing I/E biliary drain (placed 07/06/13), poor functional/nutritional status/non surgical candidate . Pt cont to have abd pain/rise in bilirubin when drain capped and becomes dehydrated when drain left to gravity bag. Current creatinine is 1.05, BUN 33,  bilirubin 0.4. Pt awaiting d/c to hospice care. Additional PMH as below. Exam: pt awake/alert; chest- CTA bilat; heart- RRR; abd- soft,+BS, mildly tender epigastric region; biliary drain intact and draining dark green bile; ext- FROM, trace edema.    Filed Vitals:   09/06/13 1453 09/06/13 2200 09/07/13 0600 09/07/13 1237  BP: 157/55 174/67 190/75 150/79  Pulse: 80 77 82 80  Temp: 98.4 F (36.9 C) 97.5 F (36.4 C) 97.9 F (36.6 C) 98 F (36.7 C)  TempSrc: Oral Oral Oral Oral  Resp: 18 18 18 18   Height:      Weight:      SpO2: 94% 99% 98% 98%   Past Medical History  Diagnosis Date  . Hypertension   . Hyperlipidemia   . GERD (gastroesophageal reflux disease)   . Asthma   . Obstructive jaundice 06/2013  . Pancreatitis   . Arthritis   . Cancer   . Heart murmur   . Thyroid disease   . Pancreatic cancer 07/05/13    pancreatic head=adenocarcinoma   Past Surgical History  Procedure Laterality Date  . Neck surgery  2005/2007  . Hand surgery  2010  . Cataract extraction    . Abdominal hysterectomy    . Eus N/A 07/05/2013    Procedure: ESOPHAGEAL ENDOSCOPIC ULTRASOUND (EUS) RADIAL;  Surgeon: Willis Modena, MD;  Location: [facility];  Service: Endoscopy;  Laterality: N/A;  . Ercp N/A 07/05/2013    Procedure: ENDOSCOPIC RETROGRADE CHOLANGIOPANCREATOGRAPHY (ERCP);  Surgeon: Willis Modena, MD;  Location: [facility];  Service: Endoscopy;  Laterality: N/A;  . Buttock cyst removal    . Cholecystectomy    Ct Abdomen Pelvis Wo Contrast  08/14/2013   CLINICAL  DATA:  Generalized abdominal pain. History of pancreatic cancer.  EXAM: CT ABDOMEN AND PELVIS WITHOUT CONTRAST  TECHNIQUE: Multidetector CT imaging of the abdomen and pelvis was performed following the standard protocol without intravenous contrast.  COMPARISON:  CT abdomen pelvis 06/24/2013; 07/23/2013.  FINDINGS: Small right pleural effusion. Minimal dependent atelectasis within the left greater than right lower lobes. Normal heart size. There is a 3.7 cm thoracic aortic aneurysm.  Lack of intravenous contrast material limits evaluation of the solid organ parenchyma.  Multiple hepatic cysts, re- demonstrated. Percutaneous biliary drainage catheter courses through the left hepatic lobe with the distal tip coiled in the duodenum. There has been interval dilation of the her intrahepatic and extrahepatic biliary ductal system. Spleen is grossly unremarkable. Re- demonstrated marked dilatation of the main pancreatic duct measuring up to 1.4 cm. Known pancreatic head mass is difficult to delineate without intravenous contrast material.  Bilateral adrenal glands are unremarkable. Kidneys are symmetric in size. No hydronephrosis. Unchanged cyst within the superior pole of the left kidney. An partially exophytic cyst within the interpolar region of the right kidney.  Extensive calcification of the normal caliber abdominal aorta. No retroperitoneal lymphadenopathy. Urinary bladder is unremarkable. Descending and sigmoid colonic diverticulosis. No evidence for acute diverticulitis.  Normal appendix. No free fluid or free intraperitoneal air.  If lower lumbar spine degenerative change. No aggressive osseous lesions.  IMPRESSION:  1. Interval development of extensive intrahepatic and extrahepatic biliary ductal dilatation when compared to the recent prior examination, raising the possibility of percutaneous biliary drainage failure. Recommend correlation with laboratory analysis and possible ultrasound as clinically indicated  as lack of intravenous contrast material somewhat limits evaluation. 2. Pancreatic head mass is not well delineated due to lack of intravenous contrast material. Persistent marked dilation of the pancreatic duct. 3. Probable multiple hepatic cysts. 4. Small right pleural effusion. 5. Distal thoracic aortic aneurysm.   Electronically Signed   By: Annia Belt M.D.   On: 08/14/2013 16:36   Ct Head Wo Contrast  09/04/2013   CLINICAL DATA:  Posterior head and neck pain after falling.  EXAM: CT HEAD WITHOUT CONTRAST  CT CERVICAL SPINE WITHOUT CONTRAST  TECHNIQUE: Multidetector CT imaging of the head and cervical spine was performed following the standard protocol without intravenous contrast. Multiplanar CT image reconstructions of the cervical spine were also generated.  COMPARISON:  Cervical spine radiographs 05/13/2006. Chest CT 07/07/2013.  FINDINGS: CT HEAD FINDINGS  There is no evidence of acute intracranial hemorrhage, mass lesion, brain edema or extra-axial fluid collection. The ventricles and subarachnoid spaces are appropriately sized for age. There is no CT evidence of acute cortical infarction.  The visualized paranasal sinuses, mastoid air cells and middle ears are clear. The calvarium is intact.  CT CERVICAL SPINE FINDINGS  The alignment is stable. There is no evidence acute fracture or traumatic subluxation. There are multilevel surgical changes status post C3 through C7 posterior decompression and fusion. There is an anterior plate and screws at C4-5 and bilateral pedicle screws from C3 through C7. No hardware loosening is identified. The interbody fusion appears solid. There is disc degeneration with endplate degeneration at C7-T1.  Heterogeneous enlargement of the thyroid gland appears grossly stable. There are scattered vascular calcifications. No acute soft tissue findings are evident.  IMPRESSION: 1. No acute intracranial or calvarial findings. 2. No evidence of acute cervical spine fracture,  traumatic subluxation or static signs of instability. 3. Grossly stable appearance of the cervical spine status post laminectomy and fusion. No hardware loosening identified.   Electronically Signed   By: Roxy Horseman M.D.   On: 09/04/2013 17:25   Ct Cervical Spine Wo Contrast  09/04/2013   CLINICAL DATA:  Posterior head and neck pain after falling.  EXAM: CT HEAD WITHOUT CONTRAST  CT CERVICAL SPINE WITHOUT CONTRAST  TECHNIQUE: Multidetector CT imaging of the head and cervical spine was performed following the standard protocol without intravenous contrast. Multiplanar CT image reconstructions of the cervical spine were also generated.  COMPARISON:  Cervical spine radiographs 05/13/2006. Chest CT 07/07/2013.  FINDINGS: CT HEAD FINDINGS  There is no evidence of acute intracranial hemorrhage, mass lesion, brain edema or extra-axial fluid collection. The ventricles and subarachnoid spaces are appropriately sized for age. There is no CT evidence of acute cortical infarction.  The visualized paranasal sinuses, mastoid air cells and middle ears are clear. The calvarium is intact.  CT CERVICAL SPINE FINDINGS  The alignment is stable. There is no evidence acute fracture or traumatic subluxation. There are multilevel surgical changes status post C3 through C7 posterior decompression and fusion. There is an anterior plate and screws at C4-5 and bilateral pedicle screws from C3 through C7. No hardware loosening is identified. The interbody fusion appears solid. There is disc degeneration with endplate degeneration at C7-T1.  Heterogeneous enlargement of the thyroid gland appears grossly stable. There are scattered vascular calcifications. No acute soft tissue  findings are evident.  IMPRESSION: 1. No acute intracranial or calvarial findings. 2. No evidence of acute cervical spine fracture, traumatic subluxation or static signs of instability. 3. Grossly stable appearance of the cervical spine status post laminectomy and  fusion. No hardware loosening identified.   Electronically Signed   By: Roxy Horseman M.D.   On: 09/04/2013 17:25  Results for orders placed during the hospital encounter of 09/04/13  CBC      Result Value Range   WBC    4.0 - 10.5 K/uL   Value: PATIENT IDENTIFICATION ERROR. PLEASE DISREGARD RESULTS. ACCOUNT WILL BE CREDITED.   RBC    3.87 - 5.11 MIL/uL   Value: PATIENT IDENTIFICATION ERROR. PLEASE DISREGARD RESULTS. ACCOUNT WILL BE CREDITED.   Hemoglobin    12.0 - 15.0 g/dL   Value: PATIENT IDENTIFICATION ERROR. PLEASE DISREGARD RESULTS. ACCOUNT WILL BE CREDITED.   HCT    36.0 - 46.0 %   Value: PATIENT IDENTIFICATION ERROR. PLEASE DISREGARD RESULTS. ACCOUNT WILL BE CREDITED.   MCV    78.0 - 100.0 fL   Value: PATIENT IDENTIFICATION ERROR. PLEASE DISREGARD RESULTS. ACCOUNT WILL BE CREDITED.   MCH    26.0 - 34.0 pg   Value: PATIENT IDENTIFICATION ERROR. PLEASE DISREGARD RESULTS. ACCOUNT WILL BE CREDITED.   MCHC    30.0 - 36.0 g/dL   Value: PATIENT IDENTIFICATION ERROR. PLEASE DISREGARD RESULTS. ACCOUNT WILL BE CREDITED.   RDW    11.5 - 15.5 %   Value: PATIENT IDENTIFICATION ERROR. PLEASE DISREGARD RESULTS. ACCOUNT WILL BE CREDITED.   Platelets    150 - 400 K/uL   Value: PATIENT IDENTIFICATION ERROR. PLEASE DISREGARD RESULTS. ACCOUNT WILL BE CREDITED.  BASIC METABOLIC PANEL      Result Value Range   Sodium    135 - 145 mEq/L   Value: PATIENT IDENTIFICATION ERROR. PLEASE DISREGARD RESULTS. ACCOUNT WILL BE CREDITED.   Potassium    3.5 - 5.1 mEq/L   Value: PATIENT IDENTIFICATION ERROR. PLEASE DISREGARD RESULTS. ACCOUNT WILL BE CREDITED.   Chloride    96 - 112 mEq/L   Value: PATIENT IDENTIFICATION ERROR. PLEASE DISREGARD RESULTS. ACCOUNT WILL BE CREDITED.   CO2    19 - 32 mEq/L   Value: PATIENT IDENTIFICATION ERROR. PLEASE DISREGARD RESULTS. ACCOUNT WILL BE CREDITED.   Glucose, Bld    70 - 99 mg/dL   Value: PATIENT IDENTIFICATION ERROR. PLEASE DISREGARD RESULTS. ACCOUNT WILL BE CREDITED.    BUN    6 - 23 mg/dL   Value: PATIENT IDENTIFICATION ERROR. PLEASE DISREGARD RESULTS. ACCOUNT WILL BE CREDITED.   Creatinine, Ser    0.50 - 1.10 mg/dL   Value: PATIENT IDENTIFICATION ERROR. PLEASE DISREGARD RESULTS. ACCOUNT WILL BE CREDITED.   Calcium    8.4 - 10.5 mg/dL   Value: PATIENT IDENTIFICATION ERROR. PLEASE DISREGARD RESULTS. ACCOUNT WILL BE CREDITED.   GFR calc non Af Amer    >90 mL/min   Value: PATIENT IDENTIFICATION ERROR. PLEASE DISREGARD RESULTS. ACCOUNT WILL BE CREDITED.   GFR calc Af Amer    >90 mL/min   Value: PATIENT IDENTIFICATION ERROR. PLEASE DISREGARD RESULTS. ACCOUNT WILL BE CREDITED.  URINALYSIS, ROUTINE W REFLEX MICROSCOPIC      Result Value Range   Color, Urine AMBER (*) YELLOW   APPearance CLOUDY (*) CLEAR   Specific Gravity, Urine 1.021  1.005 - 1.030   pH 5.5  5.0 - 8.0   Glucose, UA NEGATIVE  NEGATIVE mg/dL   Hgb urine dipstick NEGATIVE  NEGATIVE  Bilirubin Urine SMALL (*) NEGATIVE   Ketones, ur NEGATIVE  NEGATIVE mg/dL   Protein, ur 161 (*) NEGATIVE mg/dL   Urobilinogen, UA 0.2  0.0 - 1.0 mg/dL   Nitrite NEGATIVE  NEGATIVE   Leukocytes, UA NEGATIVE  NEGATIVE  TROPONIN I      Result Value Range   Troponin I    <0.30 ng/mL   Value: PATIENT IDENTIFICATION ERROR. PLEASE DISREGARD RESULTS. ACCOUNT WILL BE CREDITED.  HEPATIC FUNCTION PANEL      Result Value Range   Total Protein    6.0 - 8.3 g/dL   Value: PATIENT IDENTIFICATION ERROR. PLEASE DISREGARD RESULTS. ACCOUNT WILL BE CREDITED.   Albumin    3.5 - 5.2 g/dL   Value: PATIENT IDENTIFICATION ERROR. PLEASE DISREGARD RESULTS. ACCOUNT WILL BE CREDITED.   AST    0 - 37 U/L   Value: PATIENT IDENTIFICATION ERROR. PLEASE DISREGARD RESULTS. ACCOUNT WILL BE CREDITED.   ALT    0 - 35 U/L   Value: PATIENT IDENTIFICATION ERROR. PLEASE DISREGARD RESULTS. ACCOUNT WILL BE CREDITED.   Alkaline Phosphatase    39 - 117 U/L   Value: PATIENT IDENTIFICATION ERROR. PLEASE DISREGARD RESULTS. ACCOUNT WILL BE CREDITED.    Total Bilirubin    0.3 - 1.2 mg/dL   Value: PATIENT IDENTIFICATION ERROR. PLEASE DISREGARD RESULTS. ACCOUNT WILL BE CREDITED.   Bilirubin, Direct    0.0 - 0.3 mg/dL   Value: PATIENT IDENTIFICATION ERROR. PLEASE DISREGARD RESULTS. ACCOUNT WILL BE CREDITED.   Indirect Bilirubin       Value: PATIENT IDENTIFICATION ERROR. PLEASE DISREGARD RESULTS. ACCOUNT WILL BE CREDITED.  CBC      Result Value Range   WBC 15.9 (*) 4.0 - 10.5 K/uL   RBC 4.62  3.87 - 5.11 MIL/uL   Hemoglobin 12.8  12.0 - 15.0 g/dL   HCT 09.6  04.5 - 40.9 %   MCV 81.0  78.0 - 100.0 fL   MCH 27.7  26.0 - 34.0 pg   MCHC 34.2  30.0 - 36.0 g/dL   RDW 81.1  91.4 - 78.2 %   Platelets 400  150 - 400 K/uL  TROPONIN I      Result Value Range   Troponin I <0.30  <0.30 ng/mL  BASIC METABOLIC PANEL      Result Value Range   Sodium 126 (*) 135 - 145 mEq/L   Potassium 3.4 (*) 3.5 - 5.1 mEq/L   Chloride 91 (*) 96 - 112 mEq/L   CO2 19  19 - 32 mEq/L   Glucose, Bld 138 (*) 70 - 99 mg/dL   BUN 54 (*) 6 - 23 mg/dL   Creatinine, Ser 9.56 (*) 0.50 - 1.10 mg/dL   Calcium 21.3 (*) 8.4 - 10.5 mg/dL   GFR calc non Af Amer 21 (*) >90 mL/min   GFR calc Af Amer 24 (*) >90 mL/min  URINE MICROSCOPIC-ADD ON      Result Value Range   Squamous Epithelial / LPF MANY (*) RARE   WBC, UA 0-2  <3 WBC/hpf   Bacteria, UA FEW (*) RARE   Casts HYALINE CASTS (*) NEGATIVE  CBC      Result Value Range   WBC 10.2  4.0 - 10.5 K/uL   RBC 3.65 (*) 3.87 - 5.11 MIL/uL   Hemoglobin 9.9 (*) 12.0 - 15.0 g/dL   HCT 08.6 (*) 57.8 - 46.9 %   MCV 82.2  78.0 - 100.0 fL   MCH 27.1  26.0 - 34.0 pg  MCHC 33.0  30.0 - 36.0 g/dL   RDW 16.1  09.6 - 04.5 %   Platelets 295  150 - 400 K/uL  COMPREHENSIVE METABOLIC PANEL      Result Value Range   Sodium 129 (*) 135 - 145 mEq/L   Potassium 3.4 (*) 3.5 - 5.1 mEq/L   Chloride 99  96 - 112 mEq/L   CO2 20  19 - 32 mEq/L   Glucose, Bld 116 (*) 70 - 99 mg/dL   BUN 33 (*) 6 - 23 mg/dL   Creatinine, Ser 4.09  0.50 - 1.10  mg/dL   Calcium 9.3  8.4 - 81.1 mg/dL   Total Protein 6.9  6.0 - 8.3 g/dL   Albumin 2.4 (*) 3.5 - 5.2 g/dL   AST 42 (*) 0 - 37 U/L   ALT 53 (*) 0 - 35 U/L   Alkaline Phosphatase 100  39 - 117 U/L   Total Bilirubin 0.4  0.3 - 1.2 mg/dL   GFR calc non Af Amer 51 (*) >90 mL/min   GFR calc Af Amer 59 (*) >90 mL/min  PREALBUMIN      Result Value Range   Prealbumin 11.9 (*) 17.0 - 34.0 mg/dL   A/P: Pt with hx pancreatic carcinoma with biliary obstruction, s/p I/E biliary drain on 07/06/13; deemed nonsurgical candidate and now awaiting hospice care ; recent CT (08/14/13) demonstrates extensive intra/extra hepatic ductal dilatation compared to previous; pt has had issues with abd pain/intermittent rise in bili with drain capped and dehydration/episodes of renal insuff with drain to cont gravity bag. Tent plan is for attempt at biliary stent placement on 12/5. Details/risks of procedure d/w pt/family extensively with their understanding and consent.

## 2013-09-07 NOTE — Progress Notes (Signed)
Physical Therapy Treatment Patient Details Name: Carolyn [name] MRN: [id number] DOB: 1936-03-16 Today's Date: 09/07/2013 Time: 7846-9629 PT Time Calculation (min): 38 min  PT Assessment / Plan / Recommendation  History of Present Illness 77 yo female admitted with dehydration, weakness, fall. Hx of pancreatic cancer, biliary drain L.    PT Comments   Progressing with mobility.   Follow Up Recommendations  Home health PT;Supervision/Assistance - 24 hour     Does the patient have the potential to tolerate intense rehabilitation     Barriers to Discharge        Equipment Recommendations  Rolling walker with 5" wheels    Recommendations for Other Services    Frequency Min 3X/week   Progress towards PT Goals Progress towards PT goals: Progressing toward goals  Plan Current plan remains appropriate    Precautions / Restrictions Precautions Precautions: Fall Precaution Comments: drain Restrictions Weight Bearing Restrictions: No   Pertinent Vitals/Pain Pt denies pain    Mobility  Bed Mobility Bed Mobility: Sit to Supine Sit to Supine: HOB elevated;4: Min assist Details for Bed Mobility Assistance: Assist for LE onto bed Transfers Transfers: Sit to Stand;Stand to Sit Sit to Stand: 4: Min guard;From toilet;From bed Stand to Sit: To toilet;To bed Details for Transfer Assistance: VCS safety, technqiue, hand placemenet Ambulation/Gait Ambulation/Gait Assistance: 4: Min guard Ambulation Distance (Feet): 250 Feet Assistive device: Rolling walker Ambulation/Gait Assistance Details: close guard for safety. Noted some mild LE unsteadiness but no LOB. slow gait speed Gait Pattern: Step-through pattern    Exercises     PT Diagnosis:    PT Problem List:   PT Treatment Interventions:     PT Goals (current goals can now be found in the care plan section)    Visit Information  Last PT Received On: 09/07/13 Assistance Needed: +1 History of Present Illness: 77 yo female  admitted with dehydration, weakness, fall. Hx of pancreatic cancer, biliary drain L.     Subjective Data      Cognition  Cognition Arousal/Alertness: Awake/alert Behavior During Therapy: WFL for tasks assessed/performed Overall Cognitive Status: Within Functional Limits for tasks assessed (some mild confusion and processing issues)    Balance     End of Session PT - End of Session Equipment Utilized During Treatment: Gait belt Activity Tolerance: Patient tolerated treatment well Patient left: in bed;with call bell/phone within reach;with family/visitor present Nurse Communication: Mobility status   GP     Rebeca Alert, MPT Pager: [phone]

## 2013-09-07 NOTE — Progress Notes (Addendum)
Subjective: Follow up Pancreatic CA There are no great options left. Seen by Dr Myna Hidalgo, Hospice, PT, and Dr Mitzi Hansen yesterday Long discussion c family yesterday and today Because Dr Myna Hidalgo has lab orders and a calorie count - will work on D/c for tomorrow. Pain is controlled. 1-2 bites of doughnut  Objective: Vital signs in last 24 hours: Temp:  [97.5 F (36.4 C)-98.4 F (36.9 C)] 97.9 F (36.6 C) (12/04 0600) Pulse Rate:  [77-82] 82 (12/04 0600) Resp:  [18] 18 (12/04 0600) BP: (157-190)/(55-75) 190/75 mmHg (12/04 0600) SpO2:  [94 %-99 %] 98 % (12/04 0600) Weight change:  Last BM Date: 09/04/13  CBG (last 3)  No results found for this basename: GLUCAP,  in the last 72 hours  Intake/Output from previous day:  Intake/Output Summary (Last 24 hours) at 09/07/13 0705 Last data filed at 09/07/13 8295  Gross per 24 hour  Intake 1201.67 ml  Output   1215 ml  Net -13.33 ml   12/03 0701 - 12/04 0700 In: 1201.7 [I.V.:1201.7] Out: 1215 [Urine:600; Drains:615]   Physical Exam  General appearance: A and O.  Weak and frail. Eyes: no scleral icterus Throat: oropharynx moist without erythema Resp: CTA Cardio: Reg c m GI: soft, non-tender; bowel sounds normal; no masses,  no organomegaly.  Biliary drainage in place.  CDI Extremities: no clubbing, cyanosis or edema   Lab Results:  Recent Labs  09/04/13 1852 09/06/13 0815  NA 126* 129*  K 3.4* 3.4*  CL 91* 99  CO2 19 20  GLUCOSE 138* 116*  BUN 54* 33*  CREATININE 2.21* 1.05  CALCIUM 10.8* 9.3     Recent Labs  09/04/13 1717 09/06/13 0815  AST PATIENT IDENTIFICATION ERROR. PLEASE DISREGARD RESULTS. ACCOUNT WILL BE CREDITED. 42*  ALT PATIENT IDENTIFICATION ERROR. PLEASE DISREGARD RESULTS. ACCOUNT WILL BE CREDITED. 53*  ALKPHOS PATIENT IDENTIFICATION ERROR. PLEASE DISREGARD RESULTS. ACCOUNT WILL BE CREDITED. 100  BILITOT PATIENT IDENTIFICATION ERROR. PLEASE DISREGARD RESULTS. ACCOUNT WILL BE CREDITED. 0.4  PROT  PATIENT IDENTIFICATION ERROR. PLEASE DISREGARD RESULTS. ACCOUNT WILL BE CREDITED. 6.9  ALBUMIN PATIENT IDENTIFICATION ERROR. PLEASE DISREGARD RESULTS. ACCOUNT WILL BE CREDITED. 2.4*     Recent Labs  09/04/13 1852 09/06/13 0815  WBC 15.9* 10.2  HGB 12.8 9.9*  HCT 37.4 30.0*  MCV 81.0 82.2  PLT 400 295    Lab Results  Component Value Date   INR 1.24 07/24/2013   INR 0.94 07/02/2013     Recent Labs  09/04/13 1717 09/04/13 1852  TROPONINI PATIENT IDENTIFICATION ERROR. PLEASE DISREGARD RESULTS. ACCOUNT WILL BE CREDITED. <0.30    No results found for this basename: TSH, T4TOTAL, FREET3, T3FREE, THYROIDAB,  in the last 72 hours  No results found for this basename: VITAMINB12, FOLATE, FERRITIN, TIBC, IRON, RETICCTPCT,  in the last 72 hours  Micro Results: No results found for this or any previous visit (from the past 240 hour(s)).   Studies/Results: No results found.   Medications: Scheduled: . ALPRAZolam  0.25 mg Oral TID  . citalopram  20 mg Oral Daily  . feeding supplement (RESOURCE BREEZE)  1 Container Oral TID BM  . fentaNYL  50 mcg Transdermal Q72H  . lipase/protease/amylase  2 capsule Oral TID AC  . metoCLOPramide  5 mg Oral TID AC  . pantoprazole  40 mg Oral Daily  . predniSONE  10 mg Oral Q breakfast   Continuous: . sodium chloride 50 mL/hr at 09/06/13 0531     Assessment/Plan: Active Problems:   Dehydration  Syncope   Anxiety state, unspecified   Palliative care encounter  Pancreatic CA - Chemo stopped. DNR - OOF DNR filled out and signed. XRT stopped. She met c me, Dr Myna Hidalgo, Dr Mitzi Hansen and Hospice yesterday.  Dr Mitzi Hansen stated and I agree that she has not tolerated radiation treatment well.  The recommendation was to discontinue radiation.  She has a poor prognosis. Dr Mitzi Hansen is willing to reconsider radiation treatment if the patient's performance status improves (VERY UNLIKELY), she ultimately decides to proceed with aggressive treatment and if Dr.  Donell Beers feels that this would be helpful in preparation for surgical resection. Further radiation treatment alone, in the absence of either chemotherapy or surgery, would have a very limited benefit especially considering how she is tolerating treatment.  Working on D/c c pursuing Hospice palliative care measures only. Dr Myna Hidalgo wanted GI to pursue the Celiac Block.  I do not see a note from GI.  I do not know how much help this may provide anyway.  The pt reports that her pain is currently controlled. Dr Myna Hidalgo started her on Prednisone, xanax standing and antidepressants.  Will continue but make the xanax prn. Will wok on D/c for 09/08/13.  Will allow the calorie count and labs to go forward.  It is very likely that she will go home tomorrow and get sicker, further anorexic, dehydrated and decline rapidly.  I believe she has only 2-4 weeks left.  I think comfort measures is all we have left to provide.  These ancillary tests and discussions are only confusing the pt and family.  Everyone would be better off if we only provided TLC from here.  Obstructive jaundice - Biliary drain is in place and has an internal and external drain.  The internalized component is just so backed up.  Output> Input. Hydration will be an issue once the IVF is turned off.  Bag changed out yesterday.  CA induced Anorexia and Severe Protein Calorie malnutrition - No PEG - as I believe it will just stall the inevitable.    Pain and Anxiety - meds being provided.   Deconditioning - Saw PT.  May get PT at home per hospice.  Will need DME.  Hypertensive - I have her off many of her prior anti-hypertensive meds after her admission for ARF c Hypotension. No reason to pursue. BP high today but hold BP meds as they will bring down BP but when she leaves and gets dehydrated again her BP will bottom out.  I expect up and down fluctuation of BPs.  Dizzy/DeH/Syncope/hyponatremia/ARF c Cr 2.21 and Na 126 on admit.  Now 129 and 1.05 -  S/P IVF hydration. Yesterdays labs - No UTI  DVT Prophylaxis - none need    LOS: 3 days   Lakethia Coppess M 09/07/2013, 7:05 AM

## 2013-09-07 NOTE — Progress Notes (Signed)
MD on call contacted, daughter concerned that patient is not taking verapamil regimen. RN discussed with family members that continuing regimen can be dicussed with Dr. Timothy Lasso in AM, however per Dr. Clelia Croft, if family would be comforted with patient getting dose of verapamil tonight, that would be okay.  Patient and family agree that they would like to take a dose tonight. Order placed.

## 2013-09-07 NOTE — Progress Notes (Signed)
[facility]  Patient Status: [facility]  [facility] is providing the following services: rolling walker, small base quad cane, commode  If patient discharges after hours, please call [phone].   [name] 09/07/2013, 9:25 AM

## 2013-09-07 NOTE — Progress Notes (Signed)
Carolyn [name] might be a little bit better today. She did do some physical therapy yesterday.  Her appetite is still marginal. There is no nausea. She's had no vomiting.  I think a calorie count would not be a bad idea.  Biliary drain is still in place. Her bilirubin yesterday with thorough 0.4. I wonder if there is any way radiology to somehow internalize the stent.  She was seen by palliative care. Hospice  will be put in place. I did talk to her and her daughter about this. I told Carolyn [name] at the thought hospice would be a very good idea for her. I told her that she just is not a candidate for any further therapy. She would've never tolerate surgery. The pancreatic cancer, from my mind, should not be causing her any issues in the near term. She just decompensates very quickly while on therapy and is up in the hospital.  Her prealbumin is 11.9. This is a little better than it was before. Hopefully she can improve upon this.  She's not complain of any pain. As such, we might be able to hold off on doing a celiac block.  There is some suggestion that she may go home today. I does worry that she'll have a tough time at home and then in the back in the hospital. It would be nice to try to keep her for another couple days to try to get her nutritional state up and try to get her stronger. However, I will defer to Dr. Timothy Lasso regarding her discharge.  I still quite a bit of anxiety. She now is on Xanax and Celexa.  We have her on low-dose prednisone to try to help with her appetite.  On her physical exam, her blood pressure is quite high at 190/75. Temperature 97.9. Pulse is 82. Lungs are clear. Cardiac exam regular rate and rhythm with no murmurs rubs or bruits. Abdomen is soft. She has good bowel sounds. Biliary drain is intact. There is some slight tenderness in the epigastric region to palpation. Extremities shows no clubbing cyanosis or edema. Neurological exam shows no focal neurological  deficits.  Again, I spoke with Carolyn [name] and her daughter. I emphasized that we are focusing on her quality of life right now. She does has very little quality of life because of her dehydration and lack of intake. I believe that stopping therapy will improve upon this will allow her to get stronger and to enjoy the holidays and feel like she can do more that she would like.  We will continue to follow along.  Hewitt Shorts

## 2013-09-07 NOTE — Progress Notes (Signed)
  Subjective: Pt is having nausea and continues to have issues with getting treatment.  She gets dehydrated with biliary drain open, but has pain and rise in bilirubin with drain closed.  She has gotten severely dehydrated to the point of acute renal failure several times.  Most recently, she got dehydrated and fell after radiation treatment.  She was not able to tolerate chemo of chemoradiation, but has been getting XRT.  She has been referred to hospice.    Objective: Vital signs in last 24 hours: Temp:  [97.5 F (36.4 C)-98.4 F (36.9 C)] 97.9 F (36.6 C) (12/04 0600) Pulse Rate:  [77-82] 82 (12/04 0600) Resp:  [18] 18 (12/04 0600) BP: (157-190)/(55-75) 190/75 mmHg (12/04 0600) SpO2:  [94 %-99 %] 98 % (12/04 0600) Last BM Date: 09/04/13  Intake/Output from previous day: 12/03 0701 - 12/04 0700 In: 1201.7 [I.V.:1201.7] Out: 1215 [Urine:600; Drains:615] Intake/Output this shift:    General appearance: alert, cooperative and sl confused/disoriented.  very subtle. GI: soft, non distended, non tender, biliary drain in place.   Extremities: extremities normal, atraumatic, no cyanosis or edema   Lab Results:   Recent Labs  09/04/13 1852 09/06/13 0815  WBC 15.9* 10.2  HGB 12.8 9.9*  HCT 37.4 30.0*  PLT 400 295   BMET  Recent Labs  09/04/13 1852 09/06/13 0815  NA 126* 129*  K 3.4* 3.4*  CL 91* 99  CO2 19 20  GLUCOSE 138* 116*  BUN 54* 33*  CREATININE 2.21* 1.05  CALCIUM 10.8* 9.3   PT/INR No results found for this basename: LABPROT, INR,  in the last 72 hours ABG No results found for this basename: PHART, PCO2, PO2, HCO3,  in the last 72 hours  Studies/Results: No results found.  Anti-infectives: Anti-infectives   None      Assessment/Plan: s/p * No surgery found * Pancreatic cancer I think unfortunately that she will not be able to get to surgery.  She has not really had adequate neoadjuvant therapy to shrink tumor any.  She also continues to have  poor functional status and poor nutrition.  I do not see a benefit to repeating any scans right now.  She has a recent non contrast CT that was unchanged 3 weeks ago.   I will discuss with radiology to see if internal/external biliary drain can be changed out for metal stent. It appears that the internal portion of the drain is not functioning. I know it was difficult to place the original drain, and they could not get stent in via ERCP.    She did not get G tube or celiac block due to goal of minimal additional pain.    I discussed with the patient and her husband that they plan to go home and see if she can have any improvement.   We will move appt from tomorrow to after christmas.  I am not optimistic that she will have improvement, and may in fact not survive that long.      LOS: 3 days    [facility] 09/07/2013

## 2013-09-07 NOTE — Consult Note (Cosign Needed)
NAMEMarland Kitchen  Carolyn [name], Carolyn [name] NO.:  [id number]  MEDICAL RECORD NO.:  [id number]  LOCATION:  1501                         FACILITY:  [facility]  PHYSICIAN:  Josph Macho, M.D.  DATE OF BIRTH:  08-Apr-1936  DATE OF CONSULTATION:  09/06/2013 DATE OF DISCHARGE:                                CONSULTATION   REFERRING PHYSICIAN:  Creola Corn, MD  REASON FOR CONSULTATION: 1. Localized pancreatic cancer. 2. Failure to tolerate therapy for pancreatic cancer.  HISTORY OF PRESENT ILLNESS:  Carolyn [name] is well known to me.  She is a very nice 77 year old [place] American female.  Unfortunately, she continues to have issues with therapy for pancreatic cancer.  She has localized pancreatic cancer from what we can tell on all of our scans. Unfortunately, she has incredibly poor tolerance to treatment.  She has been hospitalized four times probably in the past two months as she continues to get dehydrated.  She was in Radiation Oncology and got weak and fell.  She was dehydrated.  She was subsequently admitted.  She had been on Xeloda with radiation.  We stopped this with her last admission.  She is going to stop radiation therapy.  She just has a very poor tolerance to therapy, and her quality of life is very poor right now.  She is not eating.  She does get dehydrated at home very easily.  She still has a biliary drain in.  Thankfully, her liver function tests have normalized.  When she came in, her BUN was 54 and creatinine was 2.2.  Again, she was dehydrated.  Her urine looked somewhat cloudy.  I will check for an infection.  She still has abdominal pain.  She was supposed to have a celiac block with her last admission.  Unfortunately, she was discharged and the celiac block cannot be done as an outpatient.  I think this probably needs to be done while she is inpatient this time.  I think there is a component of depression with respect to her issues. She was put on  Remeron.  It does not look like she is on this currently.  She was put on a Duragesic patch with her last admission.  She still has some pain.  Again, I think a celiac block would be quite helpful.  She says that she just does not want to eat.  She just is not hungry. Again, we tried her on different medications to help, and so far nothing has been all that effective.  Her daughter was with her.  Daughter lives in [place], [place].  She is down for a couple of weeks.  I had a long talk with Carolyn [name] and her daughter.  Carolyn [name] does not want to take any more therapy.  I clearly understand this.  Again, her quality of life is very poor right now.  She really has no quality of life.  She is not eating.  She is hurting.  She is weak.  She is not all that functional.  I think the therapy is just too much for her.  There could certainly be a component of the cancer itself causing some issues. However, she  does not have metastatic disease as far as we can tell. Her tumor marker, CA19-9 has always been very low.  When checked a month ago, it was less than 1.2.  We really need to focus on her quality of life.  I just do not see where her malignancy is going to be a problem in the near future.  However, her quality of life is suffering, and she just is not able to function, and she does not want to live like this.  I talked to Carolyn [name] and her daughter about hospice.  I told them that hospice would be a very good idea for her.  Hospice can be a valuable resource so that Carolyn [name] can be at home, and hospice can help out and try to prevent her from being admitted.  We talked about stopping treatments.  I told Carolyn [name] and her daughter that I just did not see a problem with her stopping treatments.  Again, her quality of life is very poor, and the treatments have something to do with this.  It does not make sense to continue to treat her when she continually ends up in the hospital  with complications.  They both understand this.  Carolyn [name] has had no fever.  She has had nausea, but no vomiting.  PAST MEDICAL HISTORY:  Remarkable for: 1. Localized pancreatic cancer. 2. Hypertension. 3. Hyperlipidemia. 4. Asthma. 5. Osteoarthritis.  ALLERGIES:  ASPIRIN.  MEDICATIONS:  Her medications are stated in the electronic medical record.  SOCIAL HISTORY:  Negative for tobacco or alcohol use.  FAMILY HISTORY:  Noncontributory.  PHYSICAL EXAMINATION:  GENERAL:  This is a chronically ill appearing African American female, in no obvious distress. VITAL SIGNS:  Temperature of 98.5, pulse 82, respiratory rate 18, blood pressure 164/66. HEENT:  No scleral icterus.  She has no oral lesions.  Oral mucosa is still slightly dry. NECK:  No adenopathy is noted in the neck. LUNGS:  Slight decrease at the bases. CARDIAC:  Regular rate and rhythm with normal S1, S2.  There are no murmurs, rubs, or bruits. ABDOMEN:  Soft.  Her biliary drain is intact.  There is some epigastric tenderness to palpation.  She has no fluid wave.  There is no obvious abdominal mass.  There is no obvious hepatosplenomegaly. EXTREMITIES:  Some trace edema in her legs.  She has some osteoarthritic changes in her joints.  She has 4/5 strength in her legs. SKIN:  No rashes, ecchymosis, or petechia. NEUROLOGIC:  No focal neurological deficits.  LABORATORY DATA:  Her laboratory studies on admission showed a sodium of 126, potassium 3.4, BUN 54, creatinine 2.2.  Calcium 10.8.  Her white cell count is 16, hemoglobin 12.8, hematocrit 37, platelet count 400,000.  IMPRESSION:  Carolyn [name] is a very charming 77 year old African American female.  She initially presented with localized pancreatic cancer back in the late summer.  Unfortunately, we had just made very little headway with trying to treat this in the neoadjuvant setting to get her to surgery.  She just has very poor tolerance.  I think what is  interesting is that when we checked her last prealbumin in November, it was down to 13, and we will recheck this again.  Again, I had a long talk with Carolyn [name] and her daughter.  I explained to them that we just are not going to be able to continue her on therapy.  She just is not going to tolerate any further therapy, and this  could clearly worsen her status, and it could hasten her decline. They understand this.  They understand that we direct our attention and our priority to her quality of life now.  Ms. Tonkinson definitely agrees with this.  I have spoken to them about hospice.  They are agreeable to this.  I explained that hospice, again, can make her life better and that hospice can try to keep her out of the hospital and keep her at home.  Again, they are agreeable to this.  We will see if Gastroenterology can do her celiac block while she is in the hospital.  We will check her urine.  If she has an infection, we will put her on antibiotics.  She probably will need some physical therapy while she is in the hospital.  We will work on that.  I very much appreciate Dr. Ferd Hibbs help.  He has really been outstanding in helping [place] with her while hospitalized.  Again, I am very thankful for his expertise.     Josph Macho, M.D.     PRE/MEDQ  D:  09/06/2013  T:  09/07/2013  Job:  [id number]

## 2013-09-07 NOTE — Progress Notes (Signed)
Dr Myna Hidalgo ordered a possible internalization of the Biliary drainage. I cancelled that order as the drain is already an internal/external drain. It can be clamped However when clamped she gets increased RUQ pains and pressures.

## 2013-09-08 ENCOUNTER — Inpatient Hospital Stay (HOSPITAL_COMMUNITY): Payer: Medicare Other

## 2013-09-08 ENCOUNTER — Encounter (INDEPENDENT_AMBULATORY_CARE_PROVIDER_SITE_OTHER): Payer: Medicare Other | Admitting: General Surgery

## 2013-09-08 ENCOUNTER — Ambulatory Visit: Payer: Medicare Other

## 2013-09-08 DIAGNOSIS — M6250 Muscle wasting and atrophy, not elsewhere classified, unspecified site: Secondary | ICD-10-CM

## 2013-09-08 LAB — COMPREHENSIVE METABOLIC PANEL
Albumin: 2.9 g/dL — ABNORMAL LOW (ref 3.5–5.2)
BUN: 32 mg/dL — ABNORMAL HIGH (ref 6–23)
Creatinine, Ser: 1.23 mg/dL — ABNORMAL HIGH (ref 0.50–1.10)
GFR calc Af Amer: 48 mL/min — ABNORMAL LOW (ref >90)
GFR calc non Af Amer: 42 mL/min — ABNORMAL LOW (ref >90)
Glucose, Bld: 125 mg/dL — ABNORMAL HIGH (ref 70–99)
Potassium: 3.5 mEq/L (ref 3.5–5.1)
Total Protein: 7.9 g/dL (ref 6.0–8.3)

## 2013-09-08 LAB — APTT: aPTT: 29 seconds (ref 24–37)

## 2013-09-08 LAB — URINE CULTURE
Culture: NO GROWTH
Special Requests: NORMAL

## 2013-09-08 LAB — PROTIME-INR
INR: 0.96 (ref 0.00–1.49)
Prothrombin Time: 12.6 seconds (ref 11.6–15.2)

## 2013-09-08 LAB — CBC
HCT: 33.2 % — ABNORMAL LOW (ref 36.0–46.0)
Hemoglobin: 10.9 g/dL — ABNORMAL LOW (ref 12.0–15.0)
MCH: 27 pg (ref 26.0–34.0)
MCHC: 32.8 g/dL (ref 30.0–36.0)
MCV: 82.4 fL (ref 78.0–100.0)
RDW: 15.2 % (ref 11.5–15.5)

## 2013-09-08 MED ORDER — MIDAZOLAM HCL 2 MG/2ML IJ SOLN
INTRAMUSCULAR | Status: AC
  Filled 2013-09-08: qty 6

## 2013-09-08 MED ORDER — FENTANYL CITRATE 0.05 MG/ML IJ SOLN
INTRAMUSCULAR | Status: AC | PRN
  Administered 2013-09-08 (×2): 25 ug via INTRAVENOUS
  Administered 2013-09-08: 50 ug via INTRAVENOUS
  Administered 2013-09-08: 25 ug via INTRAVENOUS

## 2013-09-08 MED ORDER — FENTANYL CITRATE 0.05 MG/ML IJ SOLN
INTRAMUSCULAR | Status: AC
  Filled 2013-09-08: qty 6

## 2013-09-08 MED ORDER — VERAPAMIL HCL 40 MG PO TABS
40.0000 mg | ORAL_TABLET | Freq: Every day | ORAL | Status: DC
  Administered 2013-09-08 – 2013-09-09 (×2): 40 mg via ORAL
  Filled 2013-09-08 (×2): qty 1

## 2013-09-08 MED ORDER — LIDOCAINE HCL 1 % IJ SOLN
INTRAMUSCULAR | Status: AC
  Filled 2013-09-08: qty 20

## 2013-09-08 MED ORDER — MIDAZOLAM HCL 2 MG/2ML IJ SOLN
INTRAMUSCULAR | Status: AC | PRN
  Administered 2013-09-08 (×2): 0.5 mg via INTRAVENOUS
  Administered 2013-09-08: 1 mg via INTRAVENOUS
  Administered 2013-09-08: 0.5 mg via INTRAVENOUS

## 2013-09-08 MED ORDER — IOHEXOL 300 MG/ML  SOLN
25.0000 mL | Freq: Once | INTRAMUSCULAR | Status: AC | PRN
  Administered 2013-09-08: 30 mL

## 2013-09-08 NOTE — Progress Notes (Signed)
Subjective: Follow up Pancreatic CA Will work on D/c today after IR - plan is for attempt at Metal internal biliary stent placement on 12/5.  I will call back @ 1 pm and offer D/c but it may be delayed till 12/6 post procedure. Pain is controlled. Appreciate Dr Donell Beers seeing pt yesterday. Everything is set up c Hospice No current C/o   Objective: Vital signs in last 24 hours: Temp:  [98 F (36.7 C)-98.5 F (36.9 C)] 98.5 F (36.9 C) (12/05 0609) Pulse Rate:  [80-89] 87 (12/05 0609) Resp:  [18-20] 18 (12/05 0609) BP: (150-214)/(64-83) 177/83 mmHg (12/05 0609) SpO2:  [98 %-100 %] 99 % (12/05 0609) Weight change:  Last BM Date: 09/04/13  CBG (last 3)  No results found for this basename: GLUCAP,  in the last 72 hours  Intake/Output from previous day:  Intake/Output Summary (Last 24 hours) at 09/08/13 0710 Last data filed at 09/08/13 0981  Gross per 24 hour  Intake    118 ml  Output    850 ml  Net   -732 ml   12/04 0701 - 12/05 0700 In: 118 [P.O.:118] Out: 850 [Urine:325; Drains:525]   Physical Exam  General appearance: A and O.  Weak and frail. Eyes: no scleral icterus Throat: oropharynx moist without erythema Resp: CTA Cardio: Reg c m GI: soft, non-tender; bowel sounds normal; no masses,  no organomegaly.  Biliary drainage in place.  CDI Extremities: no clubbing, cyanosis or edema   Lab Results:  Recent Labs  09/06/13 0815 09/08/13 0547  NA 129* 131*  K 3.4* 3.5  CL 99 98  CO2 20 20  GLUCOSE 116* 125*  BUN 33* 32*  CREATININE 1.05 1.23*  CALCIUM 9.3 10.5     Recent Labs  09/06/13 0815 09/08/13 0547  AST 42* 59*  ALT 53* 65*  ALKPHOS 100 134*  BILITOT 0.4 0.5  PROT 6.9 7.9  ALBUMIN 2.4* 2.9*     Recent Labs  09/06/13 0815 09/08/13 0547  WBC 10.2 14.3*  HGB 9.9* 10.9*  HCT 30.0* 33.2*  MCV 82.2 82.4  PLT 295 326    Lab Results  Component Value Date   INR 0.96 09/08/2013   INR 1.24 07/24/2013   INR 0.94 07/02/2013    No  results found for this basename: CKTOTAL, CKMB, CKMBINDEX, TROPONINI,  in the last 72 hours  No results found for this basename: TSH, T4TOTAL, FREET3, T3FREE, THYROIDAB,  in the last 72 hours  No results found for this basename: VITAMINB12, FOLATE, FERRITIN, TIBC, IRON, RETICCTPCT,  in the last 72 hours  Micro Results: Recent Results (from the past 240 hour(s))  URINE CULTURE     Status: None   Collection Time    09/06/13  7:49 PM      Result Value Range Status   Specimen Description URINE, CLEAN CATCH   Final   Special Requests Normal   Final   Culture  Setup Time     Final   Value: 09/07/2013 01:43     Performed at [facility] Count     Final   Value: NO GROWTH     Performed at [facility]   Culture     Final   Value: NO GROWTH     Performed at [facility]   Report Status 09/08/2013 FINAL   Final     Studies/Results: No results found.   Medications: Scheduled: . ALPRAZolam  0.25 mg Oral TID  .  citalopram  20 mg Oral Daily  . feeding supplement (RESOURCE BREEZE)  1 Container Oral TID BM  . fentaNYL  50 mcg Transdermal Q72H  . lipase/protease/amylase  2 capsule Oral TID AC  . metoCLOPramide  5 mg Oral TID AC  . pantoprazole  40 mg Oral Daily  . piperacillin-tazobactam (ZOSYN)  IV  3.375 g Intravenous On Call  . predniSONE  10 mg Oral Q breakfast  . verapamil  40 mg Oral Daily   Continuous:     Assessment/Plan: Active Problems:   Dehydration   Syncope   Anxiety state, unspecified   Palliative care encounter  Pancreatic CA - Chemo stopped. DNR - OOF DNR filled out and signed. XRT stopped. She met c me, Dr Myna Hidalgo, Dr Mitzi Hansen, Dr Donell Beers, IR, and Hospice.  Dr Mitzi Hansen stated and I agree that she has not tolerated radiation treatment well.  The recommendation was to discontinue radiation.  She has a poor prognosis. Dr Mitzi Hansen is willing to reconsider radiation treatment if the patient's performance status improves (VERY UNLIKELY), she  ultimately decides to proceed with aggressive treatment and if Dr. Donell Beers feels that this would be helpful in preparation for surgical resection. Further radiation treatment alone, in the absence of either chemotherapy or surgery, would have a very limited benefit especially considering how she is tolerating treatment.  Working on D/c c pursuing Hospice palliative care measures only. Dr Myna Hidalgo wanted GI to pursue the Celiac Block. Pain is currently controlled and block on hold. Dr Myna Hidalgo started her on Prednisone, xanax standing and antidepressants.  Will continue but make the xanax prn. Will work on D/c for 09/08/13 or 12/6.  Dr Donell Beers saw pt 09/07/13 and "I think unfortunately that she will not be able to get to surgery. She has not really had adequate neoadjuvant therapy to shrink tumor any. She also continues to have poor functional status and poor nutrition. I do not see a benefit to repeating any scans right now. She has a recent non contrast CT that was unchanged 3 weeks ago. I will discuss with radiology to see if internal/external biliary drain can be changed out for metal stent. It appears that the internal portion of the drain is not functioning. I know it was difficult to place the original drain, and they could not get stent in via ERCP.  She did not get G tube or celiac block due to goal of minimal additional pain.  I discussed with the patient and her husband that they plan to go home and see if she can have any improvement. We will move appt from tomorrow to after christmas. I am not optimistic that she will have improvement, and may in fact not survive that long. "  IR will attempt internal metal stent today, then I will attempt D/c. It is very likely that she will go get sicker, further anorexic, dehydrated and decline rapidly.  I believe she has only 2-4 weeks left.  I think comfort measures is all we have left to provide.    Obstructive jaundice - Biliary drain is in place and has an  internal and external drain.  The internalized component is just so backed up.  Output> Input. Hydration will be an issue once the IVF is turned off.  Bag changed out.  Attempt internalized metal stent.  CA induced Anorexia and Severe Protein Calorie malnutrition - No PEG - as I believe it will just stall the inevitable.    Pain and Anxiety - meds being provided.  Deconditioning - Saw PT.  May get PT at home per hospice.  Will need DME.  Hypertensive - OK for calan only - watch HR, edema, and constipation. I have her off many of her prior anti-hypertensive meds after her admission for ARF c Hypotension.   Dizzy/DeH/Syncope/hyponatremia/ARF c Cr 2.21 and Na 126 on admit.  129 and 1.05 and 131 1.23 today- S/P IVF hydration.  No UTI  DVT Prophylaxis - none need    LOS: 4 days   Letisha Yera M 09/08/2013, 7:10 AM

## 2013-09-08 NOTE — Progress Notes (Signed)
Pt had biliary stent placed today. Family at bedside, they feel pt should go home 12/6 and pt agrees. Dressing to mid abdomen d/i.

## 2013-09-08 NOTE — Discharge Summary (Signed)
Physician Discharge Summary  DISCHARGE SUMMARY   Patient ID: Carolyn [name] MR#: [id number] DOB/AGE: 75-Apr-1937 31 y.o.   Attending Physician:RUSSO,JOHN M  Patient's WUJ:WJXBJ,YNWG M, MD  Consults:Treatment Team:  Josph Macho, MD Palliative [facility]   Interventional radiology  Admit date: 09/04/2013 Discharge date: 09/09/2013  Discharge Diagnoses:  Active Problems:  Malignant tumor head pancreas: has drain in place. No further Rx planned. On hospice   Obstructive jaundice   Multinodular goiter (nontoxic), on no specific meds   Protein-calorie malnutrition, severe       Anemia, mild at 10.9   Anorexia, due to CA, on prednisone to try to help    Anxiety state, unspecified. Seems to doing OK   Palliative care encounter   Patient Active Problem List   Diagnosis Date Noted  . Anxiety state, unspecified 09/06/2013  . Palliative care encounter 09/06/2013  . Syncope 09/05/2013  . Dehydration 09/04/2013  . GERD (gastroesophageal reflux disease) 08/23/2013  . Constipation 08/23/2013  . Anorexia 08/23/2013  . Anxiety 08/23/2013  . Anemia 08/14/2013  . Leukocytosis 08/14/2013  . Intractable abdominal pain 08/14/2013  . Acute renal failure 07/23/2013  . Malignant tumor head pancreas 07/19/2013  . Pancreatic cancer 07/12/2013  . Protein-calorie malnutrition, severe 07/03/2013  . Obstructive jaundice 07/02/2013  . Pancreatic mass 07/02/2013  . Abdominal pain 07/02/2013  . Pruritus 07/02/2013  . Acute pancreatitis 07/02/2013  . Essential hypertension 05/04/2013  . Hyperlipidemia 05/04/2013  . Multinodular goiter (nontoxic) 06/22/2012   Past Medical History  Diagnosis Date  . Hypertension   . Hyperlipidemia   . GERD (gastroesophageal reflux disease)   . Asthma   . Obstructive jaundice 06/2013  . Pancreatitis   . Arthritis   . Cancer   . Heart murmur   . Thyroid disease   . Pancreatic cancer 07/05/13    pancreatic head=adenocarcinoma    Discharged Condition:  Terminal   Discharge Medications:   Medication List    STOP taking these medications       dronabinol 2.5 MG capsule  Commonly known as:  MARINOL     potassium chloride SA 20 MEQ tablet  Commonly known as:  K-DUR,KLOR-CON      TAKE these medications       acetaminophen 325 MG tablet  Commonly known as:  TYLENOL  Take 650 mg by mouth every 6 (six) hours as needed for mild pain.     ALPRAZolam 0.25 MG tablet  Commonly known as:  XANAX  Take 0.25 mg by mouth every 8 (eight) hours as needed for anxiety or sleep.     cholecalciferol 1000 UNITS tablet  Commonly known as:  VITAMIN D  Take 1,000 Units by mouth daily.     citalopram 20 MG tablet  Commonly known as:  CELEXA  Take 1 tablet (20 mg total) by mouth daily.     esomeprazole 40 MG capsule  Commonly known as:  NEXIUM  Take 40 mg by mouth daily before breakfast.     feeding supplement (RESOURCE BREEZE) Liqd  Take 1 Container by mouth 3 (three) times daily between meals.     fentaNYL 50 MCG/HR  Commonly known as:  DURAGESIC - dosed mcg/hr  Place 1 patch (50 mcg total) onto the skin every 3 (three) days.     lipase/protease/amylase 95621 UNITS Cpep capsule  Commonly known as:  CREON-12/PANCREASE  Take 3 capsules by mouth 3 (three) times daily with meals.     metoCLOPramide 5 MG tablet  Commonly known as:  REGLAN  Take 1 tablet (5 mg total) by mouth 3 (three) times daily before meals.     morphine CONCENTRATE 10 mg / 0.5 ml concentrated solution  Take 0.25 mLs (5 mg total) by mouth every 2 (two) hours as needed for moderate pain.     ondansetron 8 MG tablet  Commonly known as:  ZOFRAN  Take by mouth every 8 (eight) hours as needed for nausea or vomiting (nausea).     predniSONE 10 MG tablet  Commonly known as:  DELTASONE  Take 1 tablet (10 mg total) by mouth daily with breakfast.     verapamil 40 MG tablet  Commonly known as:  CALAN  Take 40 mg by mouth once.        Hospital Procedures: Ct Abdomen  Pelvis Wo Contrast  08/14/2013   CLINICAL DATA:  Generalized abdominal pain. History of pancreatic cancer.  EXAM: CT ABDOMEN AND PELVIS WITHOUT CONTRAST  TECHNIQUE: Multidetector CT imaging of the abdomen and pelvis was performed following the standard protocol without intravenous contrast.  COMPARISON:  CT abdomen pelvis 06/24/2013; 07/23/2013.  FINDINGS: Small right pleural effusion. Minimal dependent atelectasis within the left greater than right lower lobes. Normal heart size. There is a 3.7 cm thoracic aortic aneurysm.  Lack of intravenous contrast material limits evaluation of the solid organ parenchyma.  Multiple hepatic cysts, re- demonstrated. Percutaneous biliary drainage catheter courses through the left hepatic lobe with the distal tip coiled in the duodenum. There has been interval dilation of the her intrahepatic and extrahepatic biliary ductal system. Spleen is grossly unremarkable. Re- demonstrated marked dilatation of the main pancreatic duct measuring up to 1.4 cm. Known pancreatic head mass is difficult to delineate without intravenous contrast material.  Bilateral adrenal glands are unremarkable. Kidneys are symmetric in size. No hydronephrosis. Unchanged cyst within the superior pole of the left kidney. An partially exophytic cyst within the interpolar region of the right kidney.  Extensive calcification of the normal caliber abdominal aorta. No retroperitoneal lymphadenopathy. Urinary bladder is unremarkable. Descending and sigmoid colonic diverticulosis. No evidence for acute diverticulitis.  Normal appendix. No free fluid or free intraperitoneal air.  If lower lumbar spine degenerative change. No aggressive osseous lesions.  IMPRESSION: 1. Interval development of extensive intrahepatic and extrahepatic biliary ductal dilatation when compared to the recent prior examination, raising the possibility of percutaneous biliary drainage failure. Recommend correlation with laboratory analysis and  possible ultrasound as clinically indicated as lack of intravenous contrast material somewhat limits evaluation. 2. Pancreatic head mass is not well delineated due to lack of intravenous contrast material. Persistent marked dilation of the pancreatic duct. 3. Probable multiple hepatic cysts. 4. Small right pleural effusion. 5. Distal thoracic aortic aneurysm.   Electronically Signed   By: Annia Belt M.D.   On: 08/14/2013 16:36   Ct Head Wo Contrast  09/04/2013   CLINICAL DATA:  Posterior head and neck pain after falling.  EXAM: CT HEAD WITHOUT CONTRAST  CT CERVICAL SPINE WITHOUT CONTRAST  TECHNIQUE: Multidetector CT imaging of the head and cervical spine was performed following the standard protocol without intravenous contrast. Multiplanar CT image reconstructions of the cervical spine were also generated.  COMPARISON:  Cervical spine radiographs 05/13/2006. Chest CT 07/07/2013.  FINDINGS: CT HEAD FINDINGS  There is no evidence of acute intracranial hemorrhage, mass lesion, brain edema or extra-axial fluid collection. The ventricles and subarachnoid spaces are appropriately sized for age. There is no CT evidence of acute cortical infarction.  The visualized paranasal sinuses, mastoid  air cells and middle ears are clear. The calvarium is intact.  CT CERVICAL SPINE FINDINGS  The alignment is stable. There is no evidence acute fracture or traumatic subluxation. There are multilevel surgical changes status post C3 through C7 posterior decompression and fusion. There is an anterior plate and screws at C4-5 and bilateral pedicle screws from C3 through C7. No hardware loosening is identified. The interbody fusion appears solid. There is disc degeneration with endplate degeneration at C7-T1.  Heterogeneous enlargement of the thyroid gland appears grossly stable. There are scattered vascular calcifications. No acute soft tissue findings are evident.  IMPRESSION: 1. No acute intracranial or calvarial findings. 2. No  evidence of acute cervical spine fracture, traumatic subluxation or static signs of instability. 3. Grossly stable appearance of the cervical spine status post laminectomy and fusion. No hardware loosening identified.   Electronically Signed   By: Roxy Horseman M.D.   On: 09/04/2013 17:25   Ct Cervical Spine Wo Contrast  09/04/2013   CLINICAL DATA:  Posterior head and neck pain after falling.  EXAM: CT HEAD WITHOUT CONTRAST  CT CERVICAL SPINE WITHOUT CONTRAST  TECHNIQUE: Multidetector CT imaging of the head and cervical spine was performed following the standard protocol without intravenous contrast. Multiplanar CT image reconstructions of the cervical spine were also generated.  COMPARISON:  Cervical spine radiographs 05/13/2006. Chest CT 07/07/2013.  FINDINGS: CT HEAD FINDINGS  There is no evidence of acute intracranial hemorrhage, mass lesion, brain edema or extra-axial fluid collection. The ventricles and subarachnoid spaces are appropriately sized for age. There is no CT evidence of acute cortical infarction.  The visualized paranasal sinuses, mastoid air cells and middle ears are clear. The calvarium is intact.  CT CERVICAL SPINE FINDINGS  The alignment is stable. There is no evidence acute fracture or traumatic subluxation. There are multilevel surgical changes status post C3 through C7 posterior decompression and fusion. There is an anterior plate and screws at C4-5 and bilateral pedicle screws from C3 through C7. No hardware loosening is identified. The interbody fusion appears solid. There is disc degeneration with endplate degeneration at C7-T1.  Heterogeneous enlargement of the thyroid gland appears grossly stable. There are scattered vascular calcifications. No acute soft tissue findings are evident.  IMPRESSION: 1. No acute intracranial or calvarial findings. 2. No evidence of acute cervical spine fracture, traumatic subluxation or static signs of instability. 3. Grossly stable appearance of the  cervical spine status post laminectomy and fusion. No hardware loosening identified.   Electronically Signed   By: Roxy Horseman M.D.   On: 09/04/2013 17:25    History of Present Illness: 39 F c known Pancreatic CA on XRT at time of admission 09/05/13. Chemo had already been stopped. Due to Obstructive jaundice an internal/external Biliary drain was placed. Output has been high. Due to Cancer related Anorexia she has not been able to keep up with insensible losses very well. In fact she had an admission in Oct for ARF c Cr 8. We clamped the drainage catheter at that time and she developed significant pains. We have left it open since then. She is in a tough spot at the moment. She has an early stage Pancreatic CA but it is in a terrible position/Location. Original goals have were to maintain strength and weight - use CHEMO and XRT to shrink the tumor - then get Dr Donell Beers to do a Whipple surgery to cure the cancer. Her inability to maintain weight, nutrition, hydration, or tolerate Chemo all but certainly is  making her NOT a surgical candidate. Also PEG tube and celiac blocks per Dr Dulce Sellar was recently placed on hold. The goal of the PEG would be to provide nutrition and allow the Biliary fluid to be replaced. However she has @ one more week of XRT and then we are going to assess where we are. I foresee and have explained to the patient that i believe we are heading towards Hospice Palliative care as we probably will not have anything else to offer. She saw Hospice during her last Inpatient admission. Although the tube will help maintain nutrition and hydration, I believe it will prolong her life and the inevitable as she potentially may suffer more (after a PEG put in). She was up in Radiation Oncology 09/04/13  and got dizzy and passed out. While ambulating to the window to register she passed out, fell, and hit the back of her head. C/O Anorexia and not eating/drinking well. She was Hypertensive. She was wheeled  to [facility] emergency department along with her husband for an evaluation. She was not hurt. CCT and neck CT showed 1. No acute intracranial or calvarial findings. 2. No evidence of acute cervical spine fracture, traumatic subluxation or static signs of instability. 3. Grossly stable appearance of the cervical spine status post laminectomy and fusion. No hardware loosening identified. EKG - Sinus tachycardia, rate 115, normal axis, normal intervals, nonspecific t wave changes, which are more pronounced than compared to prior. They called Dr A to admit for [facility], Syncope, hyponatremia, and ARF. Noted generalized weakness without Focal findings. She was admitted to floor c IVF hydration.  More recent drainage output.  She has a little pain.  She reports only wanting hospice/palliative care and ready to give up. She is not sure her family is ready yret. She asked @ being put to sleep and I said we do not do that.      Hospital Course: She was admitted for High Output Biliary drain, DeH, Hyponatremia, DeH, and Syncope.  She improved with Hydration.  Her overlying issue is her Pancreatic CA.  She clearly is dying and the medical community has nothing else to offer.  Her Chemo was stopped prior to admission. DNR confirmed - OOF DNR filled out and signed. XRT stopped during this hospital stay. She met c me, Dr Myna Hidalgo, Dr Mitzi Hansen, Dr Donell Beers, IR, and Hospice. Dr Mitzi Hansen stated and I agree that she has not tolerated radiation treatment well. The recommendation was to discontinue radiation. She has a poor prognosis. Dr Mitzi Hansen is willing to reconsider radiation treatment if the patient's performance status improves (VERY UNLIKELY), she ultimately decides to proceed with aggressive treatment and if Dr. Donell Beers feels that this would be helpful in preparation for surgical resection. Further radiation treatment alone, in the absence of either chemotherapy or surgery, would have a very limited benefit especially considering how she is  tolerating treatment.   Dr Myna Hidalgo wanted GI to pursue the Celiac Block. Pain is currently controlled and block on hold.  Dr Myna Hidalgo started her on Prednisone, xanax standing and antidepressants. Will continue but make the xanax prn.  Dr Donell Beers saw pt 09/07/13 and "I think unfortunately that she will not be able to get to surgery. She has not really had adequate neoadjuvant therapy to shrink tumor any. She also continues to have poor functional status and poor nutrition. I do not see a benefit to repeating any scans right now. She has a recent non contrast CT that was unchanged 3 weeks ago.  I will discuss with radiology to see if internal/external biliary drain can be changed out for metal stent. It appears that the internal portion of the drain is not functioning. I know it was difficult to place the original drain, and they could not get stent in via ERCP. She did not get G tube or celiac block due to goal of minimal additional pain. I discussed with the patient and her husband that they plan to go home and see if she can have any improvement. We will move appt from tomorrow to after christmas. I am not optimistic that she will have improvement, and may in fact not survive that long. " IR will attempt internal metal stent today, then I will attempt D/c. Will work on D/c for 09/08/13. It is very likely that she will go get sicker, further anorexic, dehydrated and decline rapidly. I believe she has only 2-4 weeks left. I think comfort measures is all we have left to provide.   Obstructive jaundice - Biliary drain is in place and had an internal and external drain. The internalized component is just so backed up. Output> Input. Hydration was felt to be an issue once the IVF is turned off. Bag changed out. IR saw pt and did Procedure: Cholangiogram through biliary drain; dilatation and stenting of common bile duct.  Findings: Mid to distal CBD stricture treated with 10 x 60 mm Wallflex biliary stent. Excellent  flow through CBD after stenting with widely patent lumen. Perc biliary access removed. This was done 09/08/13  CA induced Anorexia and Severe Protein Calorie malnutrition - No PEG - as I believe it will just stall the inevitable.  Pain and Anxiety - meds being provided.  Deconditioning - Saw PT in house and they recommended HHPT and 24 hr supervision. May get PT at home per hospice. Will need DME.  Hypertensive - OK for calan only - watch HR, edema, and constipation.  I have her off many of her prior anti-hypertensive meds after her admission for ARF c Hypotension.  Dizzy/DeH/Syncope/hyponatremia/ARF c Cr 2.21 and Na 126 on admit. Now 129 and 1.05 on 12/4 - S/P IVF hydration. After 24 hrs off the Cr up to 1.23and Na 131.  Albumin remains 2.4-2.9.  ALT/AST 59-65. Anemia Hbgs 09/18/13 10.9 and fine No UTI  DVT Prophylaxis - none need   She is in poor terminal shape and will be provided comfort Palliative Care She will see me in 7-10 days if she is up to it. She will see Dr Myna Hidalgo on Monday. Med list reviewed and managed.    Day of Discharge Exam BP 177/83  Pulse 87  Temp(Src) 98.5 F (36.9 C) (Oral)  Resp 18  Ht 5\' 3"  (1.6 m)  Wt 83.46 kg (183 lb 15.9 oz)  BMI 32.60 kg/m2  SpO2 99%  Physical Exam:  General appearance: frail BF sitting up in no distress Eyes: no scleral icterus  Resp: clear no wheeze, no accessory ms in use Cardio: regular with harsh sys murmur GI: distended, external drain evident, diminished BS's Extremities: thin NEURO: awake, alert, mentating well, no tremor, speech clear  Discharge Labs:  Recent Labs  09/06/13 0815 09/08/13 0547  NA 129* 131*  K 3.4* 3.5  CL 99 98  CO2 20 20  GLUCOSE 116* 125*  BUN 33* 32*  CREATININE 1.05 1.23*  CALCIUM 9.3 10.5    Recent Labs  09/06/13 0815 09/08/13 0547  AST 42* 59*  ALT 53* 65*  ALKPHOS 100 134*  BILITOT 0.4 0.5  PROT 6.9 7.9  ALBUMIN 2.4* 2.9*    Recent Labs  09/06/13 0815 09/08/13 0547   WBC 10.2 14.3*  HGB 9.9* 10.9*  HCT 30.0* 33.2*  MCV 82.2 82.4  PLT 295 326      Lab Results  Component Value Date   INR 0.96 09/08/2013   INR 1.24 07/24/2013   INR 0.94 07/02/2013       Discharge instructions:      Future Appointments Provider Department Dept Phone   09/11/2013 10:45 AM Rachael Fee [facility] [phone]   09/11/2013 11:15 AM Josph Macho, MD [place] [facility] [phone]   09/11/2013 1:00 PM [facility] [place] [facility] [phone]   09/11/2013 5:10 PM [facility] [place] [facility] [place] [facility] [phone]   09/12/2013 5:10 PM [facility] [place] [facility] [place] [facility] [phone]   09/13/2013 3:40 PM [facility] [place] [facility] [place] [facility] [phone]   09/14/2013 3:40 PM [facility] [facility] [place] [facility] [phone]   09/15/2013 3:40 PM [facility] [place] [facility] [place] [facility] [phone]   09/18/2013 2:35 PM [facility] [facility]  [facility] [phone]   10/12/2013 11:00 AM Baltazar Najjar [facility] [phone]   10/12/2013 12:00 PM Krista Blue [facility] [phone]     03-Skilled Nursing Facility Follow-up Information   Follow up with Gwen Pounds, MD In 10 days.   Specialty:  Internal Medicine   Contact information:   [address] [facility] [place] [address] [phone]       Follow up with [facility] ([facility] to follow aftr d/c please notify when pt ready to leave unit call [phone] (or aftr 5pm [phone]))    Contact information:   [facility] [address] [place], [place] [phone]      Follow up with Josph Macho, MD.   Specialty:  Oncology    Contact information:   [address] [place] [address] [phone]        Disposition: Home  Follow-up Appts: Follow-up with Dr. Timothy Lasso at [facility] in 7-10 d.  Call for appointment.  Condition on Discharge: terminal  Tests Needing Follow-up: none  Time spent in discharge (includes decision making & examination of pt): 40 min  Signed: RUSSO,JOHN M 09/08/2013, 6:50 AM

## 2013-09-08 NOTE — Progress Notes (Signed)
Calorie Count Note  Intervention:  - Diet advancement per MD - Recommend comfort feeds per pt wishes - Will continue to monitor   48 hour calorie count ordered.  Diet: Regular Supplements: Resource Breeze TID  12/4  Breakfast: 80 calories Lunch: 70 calories Supplements: 250 calories, 9g protein  12/5 Pt NPO.   Total intake from 12/4: 400 kcal (25% of minimum estimated needs)  9g protein (11% of minimum estimated needs)  Nutrition Dx: Inadequate oral intake related to no appetite as evidenced by pt report - ongoing   Goal: Nutrition per goals of care/pt wishes - met. Per palliative care notes, plan is for no artifical feeding now or in the future.    Levon Hedger MS, RD, LDN [phone] Pager [phone] After Hours Pager

## 2013-09-08 NOTE — Progress Notes (Signed)
Patient's spouse and son also in room at time of visit. Patient was sleepy after procedure. Son and father were welcoming. Offered continuing support and compassionate presence.

## 2013-09-08 NOTE — Procedures (Signed)
Procedure:  Cholangiogram through biliary drain; dilatation and stenting of common bile duct. Findings:  Mid to distal CBD stricture treated with 10 x 60 mm Wallflex biliary stent.  Excellent flow through CBD after stenting with widely patent lumen.  Perc biliary access removed.

## 2013-09-08 NOTE — Progress Notes (Signed)
Mid abdomen dressing remains clean and dry

## 2013-09-08 NOTE — Progress Notes (Signed)
[name] is really about the same. Am still not sure how much she is eating. The calorie count is still in progress.  She just looks more. She started have some temporal muscle wasting.   She did do some physical therapy yesterday.  There's been no diarrhea.  Radiology we'll try to place a metal stent today. Hopefully they will be able to accomplish this so the biliary drainage catheter can be discontinued.  Dr. Donell Beers of surgery can't see her. She has not feel that Carolyn [name] will be a surgical candidate given the difficulty is that she's had with trying to tolerate her neoadjuvant therapy.  Palliative care is still following closely.  I still think that Carolyn [name] problem will be her lack of intake. I suspect that the calorie count will confirm this. She is on low-dose prednisone to cut help with her appetite. I would like to avoid Megace given the risk of thromboembolic disease and Carolyn [name] being a significant risk for this.  She's not complaining of any pain. As such, I don't think a celiac block will be needed.  Her renal function is going up a little bit. Her creatinine is 1.23. Calcium is 10.5.  Her blood pressure is a little better. I think she is now back on her blood pressure medications.  On her physical exam, she is afebrile. Temperature is 98.5. Blood pressure is 177/83. Pulse is 87. She has no oral lesions. There is no scleral icterus. Lungs are with some slight decrease at the bases. Cardiac exam regular rate and rhythm. Abdomen is soft. Bowel sounds are decreased. There is no fluid wave. Is no guarding or tenderness. Extremities shows some trace edema.  Again, our focus is trying to a comfort care and quality of life. I just am not sure how much better Carolyn [name] is going to be able to get. She just is not eating much., I am not sure how we will be able to increase her oral intake.  It is possible probable that she will go home over the weekend. Hospice will followup at  home and I believe that we'll really essential in trying to maintain her quality of life.  I know that Carolyn [name] is trying her best. I just am concerned that she just does not have the physical ability to overcome all the problems that she has had.  I appreciate all that great care that she is receiving on 5 E.!!  Hewitt Shorts  1 Katelynn Heidler 3:14

## 2013-09-09 NOTE — Progress Notes (Signed)
Subjective: Bili stent placement/ drain removal 12/5 Pt expecting dc today Feeling good Ate some breakfast Still slight groggy from yesterday procedure  Objective: Vital signs in last 24 hours: Temp:  [97.2 F (36.2 C)-98.8 F (37.1 C)] 97.2 F (36.2 C) (12/06 1017) Pulse Rate:  [70-96] 96 (12/06 1017) Resp:  [12-18] 18 (12/06 0545) BP: (148-169)/(73-84) 148/79 mmHg (12/06 1017) SpO2:  [96 %-98 %] 97 % (12/06 1017) Last BM Date: 09/07/13  Intake/Output from previous day: 12/05 0701 - 12/06 0700 In: 467 [P.O.:467] Out: 200 [Urine:200] Intake/Output this shift:    PE: site of bili drain clean and dry Nt; no redness Afeb; vss New dressing placed  Lab Results:   Recent Labs  09/08/13 0547  WBC 14.3*  HGB 10.9*  HCT 33.2*  PLT 326   BMET  Recent Labs  09/08/13 0547  NA 131*  K 3.5  CL 98  CO2 20  GLUCOSE 125*  BUN 32*  CREATININE 1.23*  CALCIUM 10.5   PT/INR  Recent Labs  09/08/13 0547  LABPROT 12.6  INR 0.96   ABG No results found for this basename: PHART, PCO2, PO2, HCO3,  in the last 72 hours  Studies/Results: Ir Cholan Exist Tube  09/08/2013   INDICATION: Pancreatic carcinoma and biliary obstruction. Status post percutaneous biliary drain placement on 07/06/2013 via left lobe access. The patient presents for internalization of biliary stent.  EXAM: 1. CHOLANGIOGRAM THROUGH PRE-EXISTING DRAINAGE CATHETER 2. DILATATION AND PROMINENT METALLIC STENT PLACEMENT IN COMMON BILE DUCT  MEDICATIONS: 3.375 g IV Zosyn. As antibiotic prophylaxis, a Zosyn was ordered pre-procedure and administered intravenously within one hour of incision.  Sedation:  2.5 mg IV Versed, 125 mcg IV fentanyl.  TECHNIQUE: Informed written consent was obtained from [place], [place] J after a discussion of the risks, benefits and alternatives to treatment. Questions regarding the procedure were encouraged and answered. A timeout was performed prior to the initiation of the procedure.   The right upper abdominal quadrant was prepped and draped in the usual sterile fashion, and a sterile drape was applied covering the operative field. Maximum barrier sterile technique with sterile gowns and gloves were used for the procedure. A timeout was performed prior to the initiation of the procedure.  The pre-existing internal/external biliary drainage catheter was injected with contrast material and cholangiogram performed. The catheter was cut and removed over a guidewire. A 9 French sheath was advanced into left-sided intrahepatic bile ducts. Additional cholangiography was performed via the sheath. A 5 French catheter was then advanced over a guidewire into the duodenum. Additional contrast injection was performed to delineate the location of the ampulla and duodenum lumen.  A stent guide radiopaque ruler was utilized to achieve use an appropriate sized biliary stent. A 10 mm x 60 mm Wallflex covered biliary stent was chosen for placement. This was advanced over a guidewire with the distal portion deployed at the level of the duodenum. The stent was fully deployed under fluoroscopic guidance. Additional cholangiography was performed via the sheath after stent deployment. On completion of the procedure, percutaneous biliary access was removed and a sterile dressing applied at the skin exit site. The patient tolerated the procedure well without immediate postprocedural complication.  ANESTHESIA/SEDATION: Total Moderate Sedation Time  Twenty-three minutes  CONTRAST:  30 mL Omnipaque 300  COMPARISON:  07/06/2013  FLUOROSCOPY TIME:  6.0 minutes.  COMPLICATIONS: None immediate  FINDINGS: The cholangiogram demonstrates a high-grade persistent stricture of the mid to distal common bile duct. The structure was successfully  treated by placement of the self expanding covered metallic stent. The bile duct is widely patent after stent placement showing excellent flow of contrast into the duodenum. Percutaneous biliary  drainage access was therefore removed.  IMPRESSION: High-grade stricture and obstruction of the mid and distal common bile duct was successfully treated with placement of a 10 mm x 60 mm covered Wallflex biliary stent. The duct is widely patent on completion. Percutaneous biliary access was removed.   Electronically Signed   By: Irish Lack M.D.   On: 09/08/2013 12:04   Ir Biliary Dilitation  09/08/2013   INDICATION: Pancreatic carcinoma and biliary obstruction. Status post percutaneous biliary drain placement on 07/06/2013 via left lobe access. The patient presents for internalization of biliary stent.  EXAM: 1. CHOLANGIOGRAM THROUGH PRE-EXISTING DRAINAGE CATHETER 2. DILATATION AND PROMINENT METALLIC STENT PLACEMENT IN COMMON BILE DUCT  MEDICATIONS: 3.375 g IV Zosyn. As antibiotic prophylaxis, a Zosyn was ordered pre-procedure and administered intravenously within one hour of incision.  Sedation:  2.5 mg IV Versed, 125 mcg IV fentanyl.  TECHNIQUE: Informed written consent was obtained from [place], [place] J after a discussion of the risks, benefits and alternatives to treatment. Questions regarding the procedure were encouraged and answered. A timeout was performed prior to the initiation of the procedure.  The right upper abdominal quadrant was prepped and draped in the usual sterile fashion, and a sterile drape was applied covering the operative field. Maximum barrier sterile technique with sterile gowns and gloves were used for the procedure. A timeout was performed prior to the initiation of the procedure.  The pre-existing internal/external biliary drainage catheter was injected with contrast material and cholangiogram performed. The catheter was cut and removed over a guidewire. A 9 French sheath was advanced into left-sided intrahepatic bile ducts. Additional cholangiography was performed via the sheath. A 5 French catheter was then advanced over a guidewire into the duodenum. Additional contrast  injection was performed to delineate the location of the ampulla and duodenum lumen.  A stent guide radiopaque ruler was utilized to achieve use an appropriate sized biliary stent. A 10 mm x 60 mm Wallflex covered biliary stent was chosen for placement. This was advanced over a guidewire with the distal portion deployed at the level of the duodenum. The stent was fully deployed under fluoroscopic guidance. Additional cholangiography was performed via the sheath after stent deployment. On completion of the procedure, percutaneous biliary access was removed and a sterile dressing applied at the skin exit site. The patient tolerated the procedure well without immediate postprocedural complication.  ANESTHESIA/SEDATION: Total Moderate Sedation Time  Twenty-three minutes  CONTRAST:  30 mL Omnipaque 300  COMPARISON:  07/06/2013  FLUOROSCOPY TIME:  6.0 minutes.  COMPLICATIONS: None immediate  FINDINGS: The cholangiogram demonstrates a high-grade persistent stricture of the mid to distal common bile duct. The structure was successfully treated by placement of the self expanding covered metallic stent. The bile duct is widely patent after stent placement showing excellent flow of contrast into the duodenum. Percutaneous biliary drainage access was therefore removed.  IMPRESSION: High-grade stricture and obstruction of the mid and distal common bile duct was successfully treated with placement of a 10 mm x 60 mm covered Wallflex biliary stent. The duct is widely patent on completion. Percutaneous biliary access was removed.   Electronically Signed   By: Irish Lack M.D.   On: 09/08/2013 12:04    Anti-infectives: Anti-infectives   Start     Dose/Rate Route Frequency Ordered Stop   09/08/13  0600  piperacillin-tazobactam (ZOSYN) IVPB 3.375 g     3.375 g 12.5 mL/hr over 240 Minutes Intravenous On call 09/07/13 1633 09/08/13 1255      Assessment/Plan: s/p * No surgery found *  Bili stent placed 12/5 Doing  well prob dc today Plan per [facility]   LOS: 5 days    Taevyn Hausen A 09/09/2013

## 2013-09-11 ENCOUNTER — Other Ambulatory Visit (HOSPITAL_BASED_OUTPATIENT_CLINIC_OR_DEPARTMENT_OTHER): Admitting: Lab

## 2013-09-11 ENCOUNTER — Ambulatory Visit: Payer: Medicare Other

## 2013-09-11 ENCOUNTER — Ambulatory Visit (HOSPITAL_BASED_OUTPATIENT_CLINIC_OR_DEPARTMENT_OTHER): Admitting: Hematology & Oncology

## 2013-09-11 ENCOUNTER — Ambulatory Visit (HOSPITAL_BASED_OUTPATIENT_CLINIC_OR_DEPARTMENT_OTHER)

## 2013-09-11 VITALS — BP 157/66 | HR 76 | Temp 98.2°F | Resp 16 | Ht 63.0 in | Wt 182.0 lb

## 2013-09-11 DIAGNOSIS — C259 Malignant neoplasm of pancreas, unspecified: Secondary | ICD-10-CM

## 2013-09-11 LAB — CBC WITH DIFFERENTIAL (CANCER CENTER ONLY)
BASO#: 0 10*3/uL (ref 0.0–0.2)
HCT: 33 % — ABNORMAL LOW (ref 34.8–46.6)
HGB: 10.4 g/dL — ABNORMAL LOW (ref 11.6–15.9)
LYMPH#: 1 10*3/uL (ref 0.9–3.3)
MCH: 26.9 pg (ref 26.0–34.0)
MCHC: 31.5 g/dL — ABNORMAL LOW (ref 32.0–36.0)
MCV: 86 fL (ref 81–101)
MONO#: 1 10*3/uL — ABNORMAL HIGH (ref 0.1–0.9)
NEUT#: 10.3 10*3/uL — ABNORMAL HIGH (ref 1.5–6.5)
NEUT%: 78.7 % (ref 39.6–80.0)
RDW: 14.8 % (ref 11.1–15.7)
WBC: 13.1 10*3/uL — ABNORMAL HIGH (ref 3.9–10.0)

## 2013-09-11 LAB — CMP (CANCER CENTER ONLY)
Albumin: 2.8 g/dL — ABNORMAL LOW (ref 3.3–5.5)
BUN, Bld: 30 mg/dL — ABNORMAL HIGH (ref 7–22)
CO2: 26 mEq/L (ref 18–33)
Calcium: 9.9 mg/dL (ref 8.0–10.3)
Chloride: 100 mEq/L (ref 98–108)
Glucose, Bld: 131 mg/dL — ABNORMAL HIGH (ref 73–118)
Potassium: 4.1 mEq/L (ref 3.3–4.7)
Total Bilirubin: 0.5 mg/dl (ref 0.20–1.60)

## 2013-09-11 MED ORDER — LORAZEPAM 2 MG/ML IJ SOLN
INTRAMUSCULAR | Status: AC
  Filled 2013-09-11: qty 1

## 2013-09-11 MED ORDER — LORAZEPAM 2 MG/ML IJ SOLN
0.5000 mg | Freq: Once | INTRAMUSCULAR | Status: AC
  Administered 2013-09-11: 0.5 mg via INTRAVENOUS

## 2013-09-11 MED ORDER — SODIUM CHLORIDE 0.9 % IV SOLN
Freq: Once | INTRAVENOUS | Status: AC
  Administered 2013-09-11: 13:00:00 via INTRAVENOUS

## 2013-09-11 NOTE — Progress Notes (Signed)
This office note has been dictated.

## 2013-09-12 ENCOUNTER — Ambulatory Visit: Payer: Medicare Other

## 2013-09-12 ENCOUNTER — Other Ambulatory Visit: Payer: Self-pay | Admitting: Internal Medicine

## 2013-09-12 ENCOUNTER — Other Ambulatory Visit: Payer: Self-pay | Admitting: Hematology & Oncology

## 2013-09-12 LAB — PREALBUMIN: Prealbumin: 12.1 mg/dL — ABNORMAL LOW (ref 17.0–34.0)

## 2013-09-12 LAB — CANCER ANTIGEN 19-9: CA 19-9: 1.8 U/mL (ref <35.0)

## 2013-09-12 NOTE — Progress Notes (Signed)
DIAGNOSES: 1. Localized pancreatic cancer. 2. Failure to tolerate neoadjuvant therapy.  CURRENT THERAPY:  Observation/supportive care.  INTERIM HISTORY:  Carolyn [name] comes in for a followup.  She was recently hospitalized again.  __________ hospitalized 4 times in the past month or so.  She just has no tolerance to neoadjuvant therapy.  We initially stopped her Xeloda.  Despite this, she still was hospitalized.  I just do not think she can handle the radiation.  She is trying her best, but she keeps getting dehydrated.  When she gets dehydrated, she gets weak and she falls.  She was discharged on the 5th.  Thankfully, while she was hospitalized, she had her biliary stent changed to an internal metal stent.  The external tube has been taken out.  We and several other doctors talked to her and her family.  Our focus is going to be on comfort care right now.  She just is not going to able to tolerate any more therapy for the pancreatic cancer.  I just feel bad that we are not going to be able to really do much for the pancreatic cancer right now because of her poor performance status.  She comes in a wheelchair.  Her family seems to think that she is doing much better at home than when we see her.  They said she is walking, that she is eating better.  She is much more talkative.  She was seen by Dr. Donell Beers while hospitalized.  Dr. Donell Beers did not think that she was a surgical candidate at that point because of her poor tolerance to neoadjuvant therapy and the fact that she probably would not tolerate an aggressive surgical approach.  Carolyn [name] just do not say all that much.  I am not sure as to why she is not that talkative when she comes in to see [place].  She is not complaining of any kind of pain.  She says she is not taking any breakthrough pain medication.  She is on a fentanyl patch at 50 mcg. We certainly can try to cut this back a little bit and see how she does with a maybe  25 mcg patch.  I do have her on some low-dose prednisone to try to help with her appetite.  She has had no problem with bowels or bladder.  She has had no bleeding. She has had no leg swelling.  PHYSICAL EXAMINATION:  General:  This is a chronically ill appearing African American female, in no obvious distress.  She has a very flat affect.  Vital Signs:  Temperature of 98.2, pulse 76, respiratory rate 16, blood pressure 157/66, weight is 182 pounds.  Head and Neck:  She has a normocephalic, atraumatic skull.  She has no scleral icterus. There are no oral lesions.  Oral mucosa is somewhat dry.  There is no adenopathy in her neck.  Lungs:  Clear bilaterally.  Cardiac:  Regular rate and rhythm with a normal S1 and S2.  She has no murmurs, rubs, or bruits.  Abdomen:  Soft.  She has decent bowel sounds.  There is no fluid wave.  Her drainage tube site is closing up slowly.  There is no erythema or exudate __________ drainage at the tube site.  There is no palpable hepatosplenomegaly.  Back:  No tenderness over the spine, ribs, or hips.  Extremities:  Show no clubbing, cyanosis, or edema.  Strength is 3+/5.  Neurological:  No focal neurological deficits.  LABORATORY STUDIES:  White cell count  is 13, hemoglobin 10, hematocrit 33, platelet count 356.  IMPRESSION:  Carolyn [name] is a very nice 77 year old African American female.  She presented with localized/borderline resectable pancreatic cancer.  We tried to get her through neoadjuvant therapy, which we have not succeeded with.  She just has very low tolerance.  She just cannot handle our treatments.  Even dose reducing did not make a difference.  We will go ahead and get her set up with another CT scan.  This will give [place] an idea as to how things are looking with respect to her cancer.  I told the family that I still would like to try something in the future if she ever did get better.  However, I told them that she really needs to get  a whole lot better before we would ever consider any kind of therapy or surgery.  The family is trying their best to stay optimistic.  I am not sure what we are really going to be able to do with her unless she really does improve.  I have been reading a little bit about stereotactic radiosurgery for localized pancreatic cancer.  I suppose this certainly might be something that could be thought about.  I will get another set of scans on her probably in about 3 or 4 weeks.  I will plan to see her back afterwards.    ______________________________ Josph Macho, M.D. PRE/MEDQ  D:  09/11/2013  T:  09/11/2013  Job:  [id number]

## 2013-09-12 NOTE — Progress Notes (Signed)
CC:   [facility], [place] [id number] Gwen Pounds, MD Shirley Friar, MD  DIAGNOSIS:  Locally advanced pancreatic cancer.  CURRENT THERAPY:  The patient is currently receiving radiation therapy as she cannot tolerate both radiation and chemotherapy with Xeloda.  INTERIM HISTORY:  Ms. Ruppe comes in for followup.  She was recently hospitalized yet again.  She just had an incredibly difficult time with therapy.  I think she has been hospitalized 3 or 4 times since we tried to start her on neoadjuvant therapy.  I finally decided to hold off on any further Xeloda.  Even with low-dose Xeloda, she had a very poor tolerance.  She has been evaluated by Gastroenterology.  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  She is having abdominal discomfort.  This is preventing her from eating.  Currently, she is at a nursing home.  She just is not able to really be at home and cared for.  She has had a marginal appetite.  She has had a lot of anxiety.  There is no depression.  We tried to adjust her medications, so that she eats a little bit better.  She was placed on a fentanyl patch to try to help with pain issues.  She is on Creon to try to help with digestion.  She has had no fever.  There has been no bleeding.  Overall, as stated her performance status is ECOG 2 at best.  PHYSICAL EXAMINATION:  General:  This is an elderly-appearing African American female, in no obvious distress.  Vital Signs:  Temperature 98.1, pulse 98, respiratory rate 18, blood pressure 150/68.  Weight is 186.  Head and Neck:  No scleral icterus.  There are no oral lesions. Oral mucosa is still a little bit dry.  There is no adenopathy in the neck.  Lungs:  Clear bilaterally.  Cardiac:  Regular rate and rhythm with a normal S1 and S2.  There are no murmurs, rubs, or bruits. Abdomen:  Soft.  She has decent bowel sounds.  There is no fluid wave. There is no palpable abdominal mass.  There is no  palpable hepatosplenomegaly.  Extremities:  No clubbing, cyanosis, or edema. Neurologic:  No focal neurological deficits.  LABORATORY STUDIES:  White cell count 12.5, hemoglobin 11, hematocrit 34.2, and platelet count is 357.  Her prealbumin is 13.  Glucose 144.  BUN 21, creatinine 1.1.  IMPRESSION:  Ms. Oats is a 77 year old African American female.  She has localized pancreatic cancer, one would have to say that she is borderline resectable.  We have tried to get her to get to surgery, but I think this is going to be impossible.  She is not even tolerating neoadjuvant therapy.  She is on single agent radiation now.  Hopefully, she will be able to finish this up.  I am not sure if she will be a surgical candidate.  I am glad to see that her pre-albumin is more elevated, so that might be encouraging.  We will have to try to get her back in another couple of weeks or so.  I will give her some IV fluids in the office today.  I gave her a little IV pain medication.  Hopefully, she has the celiac block, this will help her out.  Overall, her prognosis still is very tenuous from my point of view as I just doubt that she will ever have surgery, and I also doubt that she will tolerate full dose systemic  chemotherapy.    ______________________________ Josph Macho, M.D. PRE/MEDQ  D:  08/27/2013  T:  09/10/2013  Job:  [phone]

## 2013-09-13 ENCOUNTER — Ambulatory Visit: Admission: RE | Admit: 2013-09-13 | Payer: Medicare Other | Source: Ambulatory Visit

## 2013-09-13 ENCOUNTER — Encounter: Payer: Self-pay | Admitting: Radiation Oncology

## 2013-09-13 ENCOUNTER — Other Ambulatory Visit: Payer: Self-pay | Admitting: *Deleted

## 2013-09-13 DIAGNOSIS — C259 Malignant neoplasm of pancreas, unspecified: Secondary | ICD-10-CM

## 2013-09-13 MED ORDER — FENTANYL 25 MCG/HR TD PT72: 25.0000 ug | MEDICATED_PATCH | TRANSDERMAL | Status: DC

## 2013-09-13 MED ORDER — PANCRELIPASE (LIP-PROT-AMYL) 36000-114000 UNITS PO CPEP: 1.0000 | ORAL_CAPSULE | Freq: Three times a day (TID) | ORAL | Status: DC

## 2013-09-13 NOTE — Telephone Encounter (Signed)
Pt's dgtr Carolyn [name] called stating when they were here on the 8th they talked about decreasing her Fentanyl patch but did not receive a new rx. Reviewed Dr Gustavo Lah note from that day. It does note she was to decrease from 50 to 25 mcg patch. Will route to Dr Myna Hidalgo for authorization and approval.   She also asked for samples of Creon. Will provide 2 bottles of the 36,000 lipase unit capsules along with a rx in case they run out again. Will place all of the rx's and bottles at the front desk for pick up.

## 2013-09-14 ENCOUNTER — Other Ambulatory Visit: Payer: Self-pay | Admitting: Hematology & Oncology

## 2013-09-14 ENCOUNTER — Other Ambulatory Visit: Payer: Self-pay | Admitting: Internal Medicine

## 2013-09-14 ENCOUNTER — Ambulatory Visit: Payer: Medicare Other

## 2013-09-15 ENCOUNTER — Ambulatory Visit: Payer: Medicare Other

## 2013-09-18 ENCOUNTER — Ambulatory Visit: Payer: Medicare Other

## 2013-09-20 NOTE — Progress Notes (Unsigned)
  Radiation Oncology         [phone] ________________________________  Name: Carolyn [name] MRN: [id number]  Date: 09/13/2013  DOB: 24-Feb-1936  End of Treatment Note  Diagnosis:   Pancreatic cancer, locally advanced     Indication for treatment:  Curative       Radiation treatment dates:   08/01/2013 through 08/30/2013  Site/dose:   The patient was treated to the tumor within the head of the pancreas as well as the regional high risk nodal regions in close proximity. She was planned to receive treatment using a simultaneous integrated boost technique with the highest dosed area receiving 53.2 gray at 1.9 gray per fraction. The patient really was unable to tolerate treatment as noted below and she discontinued this treatment after 22 fractions.  Narrative: The patient was unable to tolerate neoadjuvant chemoradiotherapy. She was hospitalized several times and discontinued even after a change in dose of her chemotherapy. Her performance status did decline somewhat during treatment and she had poor nutritional intake.  Plan: The patient has completed radiation treatment. She is following up with Dr. Myna Hidalgo. She has been seen by Dr. Donell Beers who does not feel that she is a good candidate for surgery at this time.   ________________________________  Radene Gunning, M.D., Ph.D.

## 2013-09-20 NOTE — Consult Note (Signed)
I have reviewed and discussed the care of this patient in detail with the nurse practitioner including pertinent patient records, physical exam findings and data. I agree with details of this encounter.  

## 2013-09-22 ENCOUNTER — Ambulatory Visit: Payer: Medicare Other

## 2013-10-03 ENCOUNTER — Encounter (HOSPITAL_COMMUNITY): Payer: Self-pay

## 2013-10-03 ENCOUNTER — Ambulatory Visit (HOSPITAL_COMMUNITY)
Admission: RE | Admit: 2013-10-03 | Discharge: 2013-10-03 | Disposition: A | Discharge status: Home / Self Care | Source: Ambulatory Visit | Attending: Hematology & Oncology | Admitting: Hematology & Oncology

## 2013-10-03 DIAGNOSIS — M47817 Spondylosis without myelopathy or radiculopathy, lumbosacral region: Secondary | ICD-10-CM | POA: Insufficient documentation

## 2013-10-03 DIAGNOSIS — K838 Other specified diseases of biliary tract: Secondary | ICD-10-CM | POA: Insufficient documentation

## 2013-10-03 DIAGNOSIS — R05 Cough: Secondary | ICD-10-CM | POA: Insufficient documentation

## 2013-10-03 DIAGNOSIS — I2510 Atherosclerotic heart disease of native coronary artery without angina pectoris: Secondary | ICD-10-CM | POA: Insufficient documentation

## 2013-10-03 DIAGNOSIS — M4310 Spondylolisthesis, site unspecified: Secondary | ICD-10-CM | POA: Insufficient documentation

## 2013-10-03 DIAGNOSIS — K5730 Diverticulosis of large intestine without perforation or abscess without bleeding: Secondary | ICD-10-CM | POA: Insufficient documentation

## 2013-10-03 DIAGNOSIS — Z9089 Acquired absence of other organs: Secondary | ICD-10-CM | POA: Insufficient documentation

## 2013-10-03 DIAGNOSIS — C259 Malignant neoplasm of pancreas, unspecified: Secondary | ICD-10-CM | POA: Insufficient documentation

## 2013-10-03 DIAGNOSIS — K7689 Other specified diseases of liver: Secondary | ICD-10-CM | POA: Insufficient documentation

## 2013-10-03 DIAGNOSIS — Z923 Personal history of irradiation: Secondary | ICD-10-CM | POA: Insufficient documentation

## 2013-10-03 DIAGNOSIS — R059 Cough, unspecified: Secondary | ICD-10-CM | POA: Insufficient documentation

## 2013-10-03 DIAGNOSIS — I77810 Thoracic aortic ectasia: Secondary | ICD-10-CM | POA: Insufficient documentation

## 2013-10-03 DIAGNOSIS — R634 Abnormal weight loss: Secondary | ICD-10-CM | POA: Insufficient documentation

## 2013-10-03 DIAGNOSIS — I700 Atherosclerosis of aorta: Secondary | ICD-10-CM | POA: Insufficient documentation

## 2013-10-03 DIAGNOSIS — R1084 Generalized abdominal pain: Secondary | ICD-10-CM | POA: Insufficient documentation

## 2013-10-03 DIAGNOSIS — N281 Cyst of kidney, acquired: Secondary | ICD-10-CM | POA: Insufficient documentation

## 2013-10-03 DIAGNOSIS — Z9221 Personal history of antineoplastic chemotherapy: Secondary | ICD-10-CM | POA: Insufficient documentation

## 2013-10-03 DIAGNOSIS — K8689 Other specified diseases of pancreas: Secondary | ICD-10-CM | POA: Insufficient documentation

## 2013-10-03 MED ORDER — IOHEXOL 300 MG/ML  SOLN
100.0000 mL | Freq: Once | INTRAMUSCULAR | Status: AC | PRN
  Administered 2013-10-03: 100 mL via INTRAVENOUS

## 2013-10-12 ENCOUNTER — Other Ambulatory Visit: Payer: Medicare Other

## 2013-10-12 ENCOUNTER — Ambulatory Visit: Payer: Medicare Other | Admitting: Genetic Counselor

## 2013-10-12 ENCOUNTER — Encounter: Payer: Self-pay | Admitting: Genetic Counselor

## 2013-10-12 NOTE — Progress Notes (Signed)
The patient did not show for her appointment.  If she would like to reschedule, please have her call for an appointment.

## 2013-10-13 ENCOUNTER — Ambulatory Visit (HOSPITAL_BASED_OUTPATIENT_CLINIC_OR_DEPARTMENT_OTHER): Admitting: Hematology & Oncology

## 2013-10-13 ENCOUNTER — Telehealth: Payer: Self-pay | Admitting: Hematology & Oncology

## 2013-10-13 ENCOUNTER — Other Ambulatory Visit (HOSPITAL_BASED_OUTPATIENT_CLINIC_OR_DEPARTMENT_OTHER): Admitting: Lab

## 2013-10-13 ENCOUNTER — Ambulatory Visit: Payer: Medicare Other

## 2013-10-13 VITALS — BP 181/60 | HR 82 | Temp 98.6°F | Resp 16 | Ht 63.0 in | Wt 196.0 lb

## 2013-10-13 DIAGNOSIS — C259 Malignant neoplasm of pancreas, unspecified: Secondary | ICD-10-CM

## 2013-10-13 LAB — CMP (CANCER CENTER ONLY)
ALBUMIN: 2.2 g/dL — AB (ref 3.3–5.5)
ALK PHOS: 99 U/L — AB (ref 26–84)
ALT(SGPT): 13 U/L (ref 10–47)
AST: 33 U/L (ref 11–38)
BUN: 10 mg/dL (ref 7–22)
CHLORIDE: 88 meq/L — AB (ref 98–108)
CO2: 35 mEq/L — ABNORMAL HIGH (ref 18–33)
CREATININE: 0.6 mg/dL (ref 0.6–1.2)
Calcium: 8.7 mg/dL (ref 8.0–10.3)
Glucose, Bld: 107 mg/dL (ref 73–118)
Potassium: 2.8 mEq/L — CL (ref 3.3–4.7)
Sodium: 138 mEq/L (ref 128–145)
Total Bilirubin: 0.4 mg/dl (ref 0.20–1.60)
Total Protein: 7.4 g/dL (ref 6.4–8.1)

## 2013-10-13 LAB — CBC WITH DIFFERENTIAL (CANCER CENTER ONLY)
BASO#: 0 10*3/uL (ref 0.0–0.2)
BASO%: 0.2 % (ref 0.0–2.0)
EOS%: 2 % (ref 0.0–7.0)
Eosinophils Absolute: 0.2 10*3/uL (ref 0.0–0.5)
HEMATOCRIT: 31.7 % — AB (ref 34.8–46.6)
HGB: 9.5 g/dL — ABNORMAL LOW (ref 11.6–15.9)
LYMPH#: 1.8 10*3/uL (ref 0.9–3.3)
LYMPH%: 17.1 % (ref 14.0–48.0)
MCH: 25.8 pg — AB (ref 26.0–34.0)
MCHC: 30 g/dL — AB (ref 32.0–36.0)
MCV: 86 fL (ref 81–101)
MONO#: 1.5 10*3/uL — AB (ref 0.1–0.9)
MONO%: 14.4 % — AB (ref 0.0–13.0)
NEUT#: 6.8 10*3/uL — ABNORMAL HIGH (ref 1.5–6.5)
NEUT%: 66.3 % (ref 39.6–80.0)
PLATELETS: 446 10*3/uL — AB (ref 145–400)
RBC: 3.68 10*6/uL — ABNORMAL LOW (ref 3.70–5.32)
RDW: 14.7 % (ref 11.1–15.7)
WBC: 10.3 10*3/uL — AB (ref 3.9–10.0)

## 2013-10-13 LAB — PREALBUMIN: Prealbumin: 3.5 mg/dL — ABNORMAL LOW (ref 17.0–34.0)

## 2013-10-13 LAB — CANCER ANTIGEN 19-9: CA 19-9: 1.7 U/mL (ref <35.0)

## 2013-10-13 MED ORDER — METOLAZONE 2.5 MG PO TABS: 2.5000 mg | ORAL_TABLET | Freq: Every day | ORAL | Status: DC

## 2013-10-13 NOTE — Progress Notes (Signed)
This office note has been dictated.

## 2013-10-13 NOTE — Telephone Encounter (Signed)
Pt aware of 2-4 CT and MD appointments. She is aware to be NPO 4 hrs and to drink contrast at 730 and 830 am

## 2013-10-13 NOTE — Telephone Encounter (Signed)
Gave pt oral contrast for CT °

## 2013-10-14 NOTE — Progress Notes (Signed)
DIAGNOSIS:  Localized pancreatic cancer.  CURRENT THERAPY:  Supportive care at the present time.  INTERIM HISTORY:  Carolyn [name] comes in for followup.  She seems to be getting better.  We had to discontinue her radiation and chemotherapy.  She just could not tolerate this.  She is hospitalized probably about 4 times in a period of 2 months.  Since we stopped the treatments, she really has gotten better. According to her family, she is walking more.  I still think she probably needs some physical therapy.  I will see if hospice can help [place] out with this.  She seemed to be eating better.  She does have a lot of fluid in her legs.  I am not sure what is causing this.  We may need to add some Zaroxolyn to her Lasix to try to help with the fluid retention.  She did have a CT scan done.  This was done on October 03, 2013.  The CT scan was done of the chest, abdomen, and pelvis.  CT of the chest was unremarkable.  There was no evidence of metastatic disease to the chest. CT of the abdomen showed a pancreatic mass that measured 2.5 x 2 cm. This actually is better than what it was previously.  When it started, I think the mass was 4.1 x 3.8 cm.  She does have some "hypervascular lesion" in the left liver.  The radiologist, of course, could not exclude malignancy.  There is no way that we can really follow this, so will have another CT scan.  She does complain of pain.  She is on a fentanyl patch.  She is on 45 mcg.  This does seem to be helping her.  Her last CA19-9 back in December was 1.8.  She has had no diarrhea.  She has had no fever.  There has been no bleeding.  Currently, her performance status seems to be ECOG 2-3.  PHYSICAL EXAMINATION:  General:  On her physical exam, this is a well- developed, well-nourished African-American female who looks somewhat feeble.  Vital Signs:  Temperature of 98.6, pulse 82, respiratory rate 16, blood pressure 181/60, weight is 196  pounds.  Head and Neck: Normocephalic, atraumatic skull.  There are no ocular or oral lesions. There are no palpable cervical or supraclavicular lymph nodes.  Lungs: Clear bilaterally.  Cardiac:  Regular rate and rhythm with a normal S1 and S2.  There are no murmurs, rubs, or bruits.  Abdomen:  Soft.  She has decent bowel sounds.  There is no fluid wave.  There may be some slight tenderness in the epigastric region.  There is no palpable hepatosplenomegaly.  Extremities:  2+ edema in her legs.  Muscle strength is 4/5 bilaterally.  Neurological:  No focal neurological deficits.  LABORATORY STUDIES:  White cell count is 10.3, hemoglobin 9.5, hematocrit 31.7, platelet count 446.  IMPRESSION:  Carolyn [name] is a 78 year old African-American female.  She presented with localized pancreatic cancer back in September.  We tried to give her chemotherapy.  She had borderline resectable disease.  We gave a neoadjuvant chemo and radiation therapy.  She, unfortunately, did not tolerate this well.  It looks like it may have worked.  Her tumor is small.  I just do not feel that what is in her liver is reflective of malignancy.  Going back to the CAT scan done back in 2011, she had lesions in her liver.  I think that a followup CT scan in 1 month  would really help [place] out.  I still do not want to "give up" on Carolyn [name].  Her family seems to be encouraged that she is getting better.  She does seem to be a little bit more active.  I still do not think that she is ready for any kind of therapy.  The only therapy we would give her is chemotherapy if we were to treat her.  Again, I talked to the family at length about this.  I told them that treating her still would be difficult unless she got better.  Her family does seem to understand that we still need to watch her and try to get her stronger.  They are very active with her care and really will encourage her.  I will plan to see her back in  1 month.  We will get the CT scan on the same day I see her along with last.  I spent almost an hour with her today.    ______________________________ Volanda Napoleon, M.D. PRE/MEDQ  D:  10/13/2013  T:  10/14/2013  Job:  [id number]

## 2013-10-16 ENCOUNTER — Other Ambulatory Visit: Payer: Self-pay | Admitting: Nurse Practitioner

## 2013-10-16 ENCOUNTER — Telehealth: Payer: Self-pay | Admitting: Hematology & Oncology

## 2013-10-16 ENCOUNTER — Encounter: Payer: Self-pay | Admitting: Nurse Practitioner

## 2013-10-16 DIAGNOSIS — C259 Malignant neoplasm of pancreas, unspecified: Secondary | ICD-10-CM

## 2013-10-16 MED ORDER — POTASSIUM CHLORIDE CRYS ER 20 MEQ PO TBCR: 20.0000 meq | EXTENDED_RELEASE_TABLET | Freq: Two times a day (BID) | ORAL | Status: DC

## 2013-10-16 MED ORDER — FENTANYL 25 MCG/HR TD PT72: 25.0000 ug | MEDICATED_PATCH | TRANSDERMAL | Status: DC

## 2013-10-16 NOTE — Telephone Encounter (Signed)
daugther called changed 2-4 to 2-6 she comes from out of town.

## 2013-10-16 NOTE — Progress Notes (Signed)
Hospice Rn called requesting a rx refill for Fentanyl. RX faxed to [facility] that is on file for pt. Also rx sent for 35meq KCL r/t pt's Lasix intake. Hospice, RN also concerned that pt is requesting PT and Medicare/Hospice does not cover rehabilitation under hospice care. She stated the primary nurse will discuss with pt and her husband and inform them that if this is something they are wanting to persue they may be responsible for services of rehabilitation out of pocket. Dr. Marin Olp aware. We will await a response to see whether to set up outside rehab or not.

## 2013-10-26 ENCOUNTER — Telehealth: Payer: Self-pay | Admitting: *Deleted

## 2013-10-26 NOTE — Telephone Encounter (Signed)
Received call from Carolyn [name] patients daughter to see if there were any mobility restrictions for patient and when can she drive?  Dr. Marin Olp states that patient cannot drive until after he evaluates her next visit.  Activity as tolerated. Called back to patients daughter.  Left messsage on confidential voicemail

## 2013-11-08 ENCOUNTER — Ambulatory Visit: Admitting: Hematology & Oncology

## 2013-11-08 ENCOUNTER — Other Ambulatory Visit: Admitting: Lab

## 2013-11-08 ENCOUNTER — Other Ambulatory Visit (HOSPITAL_BASED_OUTPATIENT_CLINIC_OR_DEPARTMENT_OTHER)

## 2013-11-10 ENCOUNTER — Encounter: Payer: Self-pay | Admitting: Hematology & Oncology

## 2013-11-10 ENCOUNTER — Ambulatory Visit (HOSPITAL_BASED_OUTPATIENT_CLINIC_OR_DEPARTMENT_OTHER)
Admission: RE | Admit: 2013-11-10 | Discharge: 2013-11-10 | Disposition: A | Discharge status: Home / Self Care | Source: Ambulatory Visit | Attending: Hematology & Oncology | Admitting: Hematology & Oncology

## 2013-11-10 ENCOUNTER — Ambulatory Visit (HOSPITAL_BASED_OUTPATIENT_CLINIC_OR_DEPARTMENT_OTHER): Admitting: Hematology & Oncology

## 2013-11-10 ENCOUNTER — Other Ambulatory Visit (HOSPITAL_BASED_OUTPATIENT_CLINIC_OR_DEPARTMENT_OTHER): Admitting: Lab

## 2013-11-10 VITALS — BP 180/54 | HR 74 | Temp 98.0°F | Resp 14 | Ht 63.0 in | Wt 178.0 lb

## 2013-11-10 DIAGNOSIS — C259 Malignant neoplasm of pancreas, unspecified: Secondary | ICD-10-CM

## 2013-11-10 DIAGNOSIS — C787 Secondary malignant neoplasm of liver and intrahepatic bile duct: Secondary | ICD-10-CM | POA: Insufficient documentation

## 2013-11-10 DIAGNOSIS — I712 Thoracic aortic aneurysm, without rupture, unspecified: Secondary | ICD-10-CM | POA: Insufficient documentation

## 2013-11-10 DIAGNOSIS — K869 Disease of pancreas, unspecified: Secondary | ICD-10-CM | POA: Insufficient documentation

## 2013-11-10 DIAGNOSIS — I2510 Atherosclerotic heart disease of native coronary artery without angina pectoris: Secondary | ICD-10-CM | POA: Insufficient documentation

## 2013-11-10 DIAGNOSIS — K838 Other specified diseases of biliary tract: Secondary | ICD-10-CM | POA: Insufficient documentation

## 2013-11-10 LAB — CBC WITH DIFFERENTIAL (CANCER CENTER ONLY)
BASO#: 0 10*3/uL (ref 0.0–0.2)
BASO%: 0.3 % (ref 0.0–2.0)
EOS%: 1.2 % (ref 0.0–7.0)
Eosinophils Absolute: 0.1 10*3/uL (ref 0.0–0.5)
HCT: 36.3 % (ref 34.8–46.6)
HGB: 11.3 g/dL — ABNORMAL LOW (ref 11.6–15.9)
LYMPH#: 1.5 10*3/uL (ref 0.9–3.3)
LYMPH%: 13.8 % — AB (ref 14.0–48.0)
MCH: 25.4 pg — AB (ref 26.0–34.0)
MCHC: 31.1 g/dL — ABNORMAL LOW (ref 32.0–36.0)
MCV: 82 fL (ref 81–101)
MONO#: 0.6 10*3/uL (ref 0.1–0.9)
MONO%: 5.7 % (ref 0.0–13.0)
NEUT#: 8.6 10*3/uL — ABNORMAL HIGH (ref 1.5–6.5)
NEUT%: 79 % (ref 39.6–80.0)
PLATELETS: 377 10*3/uL (ref 145–400)
RBC: 4.45 10*6/uL (ref 3.70–5.32)
RDW: 15.2 % (ref 11.1–15.7)
WBC: 10.9 10*3/uL — AB (ref 3.9–10.0)

## 2013-11-10 LAB — CMP (CANCER CENTER ONLY)
ALT(SGPT): 14 U/L (ref 10–47)
AST: 26 U/L (ref 11–38)
Albumin: 2.9 g/dL — ABNORMAL LOW (ref 3.3–5.5)
Alkaline Phosphatase: 100 U/L — ABNORMAL HIGH (ref 26–84)
BILIRUBIN TOTAL: 0.5 mg/dL (ref 0.20–1.60)
BUN: 21 mg/dL (ref 7–22)
CO2: 30 mEq/L (ref 18–33)
Calcium: 9.9 mg/dL (ref 8.0–10.3)
Chloride: 96 mEq/L — ABNORMAL LOW (ref 98–108)
Creat: 0.6 mg/dl (ref 0.6–1.2)
GLUCOSE: 195 mg/dL — AB (ref 73–118)
Potassium: 3.4 mEq/L (ref 3.3–4.7)
Sodium: 135 mEq/L (ref 128–145)
Total Protein: 8 g/dL (ref 6.4–8.1)

## 2013-11-10 MED ORDER — FENTANYL 25 MCG/HR TD PT72: 25.0000 ug | MEDICATED_PATCH | TRANSDERMAL | Status: DC

## 2013-11-10 MED ORDER — IOHEXOL 300 MG/ML  SOLN
100.0000 mL | Freq: Once | INTRAMUSCULAR | Status: AC | PRN
  Administered 2013-11-10: 100 mL via INTRAVENOUS

## 2013-11-10 NOTE — Patient Instructions (Signed)

## 2013-11-10 NOTE — Progress Notes (Signed)
This office note has been dictated.

## 2013-11-11 LAB — CANCER ANTIGEN 19-9: CA 19-9: 3.6 U/mL (ref <35.0)

## 2013-11-11 LAB — PREALBUMIN: Prealbumin: 12.5 mg/dL — ABNORMAL LOW (ref 17.0–34.0)

## 2013-11-11 NOTE — Progress Notes (Signed)
DIAGNOSIS:  Locally advanced adenocarcinoma of the pancreas.  CURRENT THERAPY:  Observation.  INTERIM HISTORY:  Carolyn [name] comes in for followup.  Again, she is improving every time I see her.  I think we last saw her back in December.  She just looks so much better.  She is walking in now.  She is doing better with physical therapy.  She is eating better.  She is not really having much in the way of pain.  She is on fentanyl patch, which seems to be helping her.  She has had no cough or shortness of breath.  She has had no leg swelling.  She actually had some leg swelling when we last saw her, but this is better.  We did go ahead and do a CT scan on her.  This was done today actually. The CT scan unfortunately showed that she did have progressive disease. Again, there is no metastatic disease.  The pancreatic mass now measures 4.6 x 7.3 x 3 cm.  The radiologist states that this is similar to what it was before, but certainly larger than when we first saw her.  Again, there is no evidence of metastatic disease.  She had cystic lesions within the liver.  She had no problems with her biliary stent.  She has had no temperatures.  Again, her performance status now is probably ECOG 1.  PHYSICAL EXAMINATION:  This is an elderly but fairly well-nourished black female in no obvious distress.  Vital Signs:  Temperature of 98, pulse 74, respiratory rate 14, blood pressure 180/54, weight is 178 pounds.  Head and Neck:  Normocephalic, atraumatic skull.  There are no ocular or oral lesions.  There is no scleral icterus.  She has no adenopathy in the neck.  Lungs:  Clear bilaterally.  Cardiac:  Regular rate and rhythm with a normal S1, S2.  There are no murmurs, rubs, or bruits.  Abdomen:  Soft.  She has good bowel sounds.  There is no fluid wave.  There is no guarding, rebound, or tenderness.  There is no palpable hepatosplenomegaly.  Extremities:  Show no clubbing, and there is some trace  edema in her lower legs.  Her strength is 4+/5 bilaterally.  Skin:  Shows no rashes, ecchymoses or petechia. Neurological:  Shows no focal neurological deficits.  LABORATORY STUDIES:  White cell count 10.9, hemoglobin 11.3, hematocrit 36.3, platelet count 377.  Her prealbumin is 12.5.  IMPRESSION:  Carolyn [name] is a 78 year old African American female.  She has locally advanced pancreatic cancer.  When she first presented, we felt that we might be able to get her resected, but she could not tolerate chemo and radiation therapy.  She really declined.  She now is improving.  Her malignancy is growing a little bit.  I think Carolyn [name] really wants to try to give treatment a chance.  I certainly can understand this.  I think that because she is trying hard and improving, we could try systemic chemotherapy.  I think we can only go with single agent chemotherapy.  I talked to Carolyn [name] and her family.  They are all very, very nice. They are all very supportive of her.  We will need a Port-A-Cath to be put in.  We will go ahead and get that set up for her.  I would use single agent gemcitabine.  I told Carolyn [name] and the family that response rates probably will be about 20% at best.  They know that this is  not going to cure her, but if we can at least give some disease control, this may help her survival be better and a little bit longer.  We will go ahead and plan to start treatment on her, the week of February 16th.  I talk to Carolyn [name] about the side effects of treatment.  I told her that she may not loose her hair.  I said that the blood counts probably would not be affected too much.  She may not have __________ nausea or vomiting.  I spent 45 minutes or so with her today.  I will plan to have Carolyn [name] come back to see me today, she starts her treatment, so I can make sure that she is doing well.  I am encouraged that her Prealbumin is  elevated.    ______________________________ Carolyn [name], M.D. PRE/MEDQ  D:  11/10/2013  T:  11/11/2013  Job:  [id number]

## 2013-11-14 ENCOUNTER — Encounter: Payer: Self-pay | Admitting: Nurse Practitioner

## 2013-11-14 ENCOUNTER — Other Ambulatory Visit: Payer: Self-pay | Admitting: Radiology

## 2013-11-14 NOTE — Progress Notes (Signed)
Spoke w/Debbie,RN of [facility] and she informed us that the pt was going to be revoked from hospice due to she has decided to continue with chemotherapy and is having a PAC inserted. Per Dr. Marin Olp she will be receiving treatment and if Dr. Inocencio Homes feels she has to be discharged from Hospice, that he understands based on the criteria. Debbie verbalized understanding.

## 2013-11-16 ENCOUNTER — Other Ambulatory Visit: Payer: Medicare Other

## 2013-11-16 ENCOUNTER — Encounter: Payer: Self-pay | Admitting: *Deleted

## 2013-11-17 ENCOUNTER — Encounter: Payer: Self-pay | Admitting: *Deleted

## 2013-11-17 ENCOUNTER — Other Ambulatory Visit: Payer: Self-pay | Admitting: Nurse Practitioner

## 2013-11-17 ENCOUNTER — Encounter (HOSPITAL_COMMUNITY): Payer: Self-pay | Admitting: Pharmacy Technician

## 2013-11-17 DIAGNOSIS — C259 Malignant neoplasm of pancreas, unspecified: Secondary | ICD-10-CM

## 2013-11-17 DIAGNOSIS — C250 Malignant neoplasm of head of pancreas: Secondary | ICD-10-CM

## 2013-11-17 MED ORDER — LIDOCAINE-PRILOCAINE 2.5-2.5 % EX CREA: 1.0000 "application " | TOPICAL_CREAM | CUTANEOUS | Status: DC | PRN

## 2013-11-17 NOTE — Progress Notes (Signed)
[place] Psychosocial Distress Screening Clinical Social Work  Clinical Social Work was referred by distress screening protocol.  The patient scored a 6 on the Psychosocial Distress Thermometer which indicates moderate distress. Clinical Social Worker phoned Pt to assess for distress and other psychosocial needs. She denies any concerns and reports her husband filled out the form on her behalf. She is well aware of resources at the [facility] if needed. Pt not up to talking today. She plans to receive her chemo in [place] and she reports she is no longer receiving hospice services. She agreed to call CSW as needed.   Clinical Social Worker follow up needed: no  Loren Racer, [place] Social Worker [facility] [place] for [place] Wednesday, Thursday and Friday Phone: [phone] Fax: [phone]

## 2013-11-20 ENCOUNTER — Encounter (HOSPITAL_COMMUNITY): Payer: Self-pay

## 2013-11-20 ENCOUNTER — Other Ambulatory Visit

## 2013-11-20 ENCOUNTER — Ambulatory Visit (HOSPITAL_COMMUNITY)
Admission: RE | Admit: 2013-11-20 | Discharge: 2013-11-20 | Disposition: A | Discharge status: Home / Self Care | Payer: Medicare Other | Source: Ambulatory Visit | Attending: Hematology & Oncology | Admitting: Hematology & Oncology

## 2013-11-20 ENCOUNTER — Other Ambulatory Visit: Payer: Self-pay | Admitting: Hematology & Oncology

## 2013-11-20 DIAGNOSIS — C259 Malignant neoplasm of pancreas, unspecified: Secondary | ICD-10-CM

## 2013-11-20 DIAGNOSIS — E785 Hyperlipidemia, unspecified: Secondary | ICD-10-CM | POA: Insufficient documentation

## 2013-11-20 DIAGNOSIS — I10 Essential (primary) hypertension: Secondary | ICD-10-CM | POA: Insufficient documentation

## 2013-11-20 DIAGNOSIS — K219 Gastro-esophageal reflux disease without esophagitis: Secondary | ICD-10-CM | POA: Insufficient documentation

## 2013-11-20 DIAGNOSIS — Z87891 Personal history of nicotine dependence: Secondary | ICD-10-CM | POA: Insufficient documentation

## 2013-11-20 LAB — PROTIME-INR
INR: 0.94 (ref 0.00–1.49)
Prothrombin Time: 12.4 seconds (ref 11.6–15.2)

## 2013-11-20 LAB — CBC
HCT: 36.4 % (ref 36.0–46.0)
Hemoglobin: 11.7 g/dL — ABNORMAL LOW (ref 12.0–15.0)
MCH: 25.9 pg — ABNORMAL LOW (ref 26.0–34.0)
MCHC: 32.1 g/dL (ref 30.0–36.0)
MCV: 80.7 fL (ref 78.0–100.0)
PLATELETS: 408 10*3/uL — AB (ref 150–400)
RBC: 4.51 MIL/uL (ref 3.87–5.11)
RDW: 15.3 % (ref 11.5–15.5)
WBC: 9.6 10*3/uL (ref 4.0–10.5)

## 2013-11-20 LAB — APTT: aPTT: 30 seconds (ref 24–37)

## 2013-11-20 MED ORDER — LIDOCAINE-EPINEPHRINE (PF) 2 %-1:200000 IJ SOLN
INTRAMUSCULAR | Status: AC
  Filled 2013-11-20: qty 20

## 2013-11-20 MED ORDER — MIDAZOLAM HCL 2 MG/2ML IJ SOLN
INTRAMUSCULAR | Status: AC | PRN
Start: 2013-11-20 — End: 2013-11-20
  Administered 2013-11-20 (×4): 1 mg via INTRAVENOUS

## 2013-11-20 MED ORDER — CEFAZOLIN SODIUM-DEXTROSE 2-3 GM-% IV SOLR
2.0000 g | INTRAVENOUS | Status: AC
  Administered 2013-11-20: 2 g via INTRAVENOUS
  Filled 2013-11-20: qty 50

## 2013-11-20 MED ORDER — SODIUM CHLORIDE 0.9 % IV SOLN
INTRAVENOUS | Status: DC
  Administered 2013-11-20: 08:00:00 via INTRAVENOUS

## 2013-11-20 MED ORDER — MIDAZOLAM HCL 2 MG/2ML IJ SOLN
INTRAMUSCULAR | Status: AC
  Filled 2013-11-20: qty 6

## 2013-11-20 MED ORDER — FENTANYL CITRATE 0.05 MG/ML IJ SOLN
INTRAMUSCULAR | Status: AC | PRN
  Administered 2013-11-20 (×3): 50 ug via INTRAVENOUS

## 2013-11-20 MED ORDER — HEPARIN SOD (PORK) LOCK FLUSH 100 UNIT/ML IV SOLN
INTRAVENOUS | Status: AC | PRN
  Administered 2013-11-20: 500 [IU]

## 2013-11-20 MED ORDER — HEPARIN SOD (PORK) LOCK FLUSH 100 UNIT/ML IV SOLN
INTRAVENOUS | Status: AC
  Filled 2013-11-20: qty 5

## 2013-11-20 MED ORDER — FENTANYL CITRATE 0.05 MG/ML IJ SOLN
INTRAMUSCULAR | Status: AC
  Filled 2013-11-20: qty 6

## 2013-11-20 NOTE — Procedures (Signed)
Successful placement of right IJ approach port-a-cath with tip at the superior caval atrial junction. The catheter is ready for immediate use. No immediate post procedural complications. 

## 2013-11-20 NOTE — H&P (Signed)
Chief Complaint: "I'm here for a port" Referring Physician:Ennever HPI: Carolyn [name] is an 78 y.o. female with pancreatic cancer. She has undergone biliary stenting with good result. She is now to begin chemotherapy and is referred for port placement. Recent history, PMHx, meds reviewed. She is feeling ok today, no recent fevers, chills, illness.  Past Medical History:  Past Medical History  Diagnosis Date  . Hypertension   . Hyperlipidemia   . GERD (gastroesophageal reflux disease)   . Asthma   . Obstructive jaundice 06/2013  . Pancreatitis   . Arthritis   . Cancer   . Heart murmur   . Thyroid disease   . Pancreatic cancer 07/05/13    pancreatic head=adenocarcinoma    Past Surgical History:  Past Surgical History  Procedure Laterality Date  . Neck surgery  2005/2007  . Hand surgery  2010  . Cataract extraction    . Abdominal hysterectomy    . Eus N/A 07/05/2013    Procedure: ESOPHAGEAL ENDOSCOPIC ULTRASOUND (EUS) RADIAL;  Surgeon: Arta Silence, MD;  Location: [facility];  Service: Endoscopy;  Laterality: N/A;  . Ercp N/A 07/05/2013    Procedure: ENDOSCOPIC RETROGRADE CHOLANGIOPANCREATOGRAPHY (ERCP);  Surgeon: Arta Silence, MD;  Location: [name];  Service: Endoscopy;  Laterality: N/A;  . Buttock cyst removal    . Cholecystectomy      Family History:  Family History  Problem Relation Age of Onset  . Cancer Daughter     Breast    Social History:  reports that she quit smoking about 4 months ago. Her smoking use included Cigarettes. She started smoking about 25 years ago. She has a 24 pack-year smoking history. She has never used smokeless tobacco. She reports that she drinks about 0.6 ounces of alcohol per week. She reports that she does not use illicit drugs.  Allergies:  Allergies  Allergen Reactions  . Alka-Seltzer [Aspirin Effervescent] Swelling    Medications:   Medication List    ASK your doctor about these medications       acetaminophen 325  MG tablet  Commonly known as:  TYLENOL  Take 325 mg by mouth 3 (three) times daily.     ALPRAZolam 0.25 MG tablet  Commonly known as:  XANAX  Take 0.25 mg by mouth 2 (two) times daily as needed for anxiety or sleep.     benzonatate 100 MG capsule  Commonly known as:  TESSALON  Take 100 mg by mouth 3 (three) times daily as needed for cough.     citalopram 20 MG tablet  Commonly known as:  CELEXA  Take 20 mg by mouth every evening.     docusate sodium 100 MG capsule  Commonly known as:  COLACE  Take 100 mg by mouth daily.     esomeprazole 40 MG capsule  Commonly known as:  NEXIUM  Take 40 mg by mouth daily before breakfast.     lactose free nutrition Liqd  Take 237 mLs by mouth daily.     feeding supplement (RESOURCE BREEZE) Liqd  Take 1 Container by mouth 2 (two) times daily between meals.     fentaNYL 25 MCG/HR patch  Commonly known as:  DURAGESIC - dosed mcg/hr  Place 25 mcg onto the skin every 3 (three) days. HOSPICE PATIENT     furosemide 20 MG tablet  Commonly known as:  LASIX  Take 20 mg by mouth every morning.     lidocaine-prilocaine cream  Commonly known as:  EMLA  Apply 1 application  topically as needed. APPLY TO PORT SITE 1HR PRIOR TO CHEMO APPOINTMENT.     lipase/protease/amylase 12000 UNITS Cpep capsule  Commonly known as:  CREON-12/PANCREASE  Take 1 capsule by mouth daily.     loratadine 10 MG tablet  Commonly known as:  CLARITIN  Take 10 mg by mouth daily as needed for allergies.     metoCLOPramide 5 MG tablet  Commonly known as:  REGLAN  Take 5 mg by mouth 2 (two) times daily before lunch and supper.     metolazone 2.5 MG tablet  Commonly known as:  ZAROXOLYN  Take 2.5 mg by mouth every morning.     morphine CONCENTRATE 10 mg / 0.5 ml concentrated solution  Take 5 mg by mouth every 2 (two) hours as needed for moderate pain or severe pain.     morphine 15 MG tablet  Commonly known as:  MSIR  Take 15 mg by mouth every 6 (six) hours as needed  for severe pain.     naproxen sodium 220 MG tablet  Commonly known as:  ANAPROX  Take 220 mg by mouth 2 (two) times daily as needed (pain).     omeprazole 20 MG capsule  Commonly known as:  PRILOSEC  Take 20 mg by mouth daily.     ondansetron 8 MG tablet  Commonly known as:  ZOFRAN  Take by mouth every 8 (eight) hours as needed for nausea or vomiting (nausea).     polyethylene glycol packet  Commonly known as:  MIRALAX / GLYCOLAX  Take 17 g by mouth daily as needed for mild constipation.     potassium chloride SA 20 MEQ tablet  Commonly known as:  K-DUR,KLOR-CON  Take 20 mEq by mouth 2 (two) times daily.     verapamil 120 MG tablet  Commonly known as:  CALAN  Take 120 mg by mouth every morning.     Vitamin D 400 UNITS capsule  Take 400 Units by mouth daily.        Please HPI for pertinent positives, otherwise complete 10 system ROS negative.  Physical Exam: BP 131/90  Pulse 84  Temp(Src) 97.1 F (36.2 C) (Oral)  Resp 16  SpO2 99% There is no weight on file to calculate BMI.   General Appearance:  Alert, cooperative, no distress, appears stated age  Head:  Normocephalic, without obvious abnormality, atraumatic  ENT: Unremarkable  Neck: Supple, symmetrical, trachea midline  Lungs:   Clear to auscultation bilaterally, no w/r/r, respirations unlabored without use of accessory muscles.  Chest Wall:  No tenderness or deformity  Heart:  Regular rate and rhythm, S1, S2 normal, no murmur, rub or gallop.  Abdomen:   Soft, non-tender, non distended.  Neurologic: Normal affect, no gross deficits.   Results for orders placed during the hospital encounter of 11/20/13 (from the past 48 hour(s))  APTT     Status: None   Collection Time    11/20/13  8:10 AM      Result Value Ref Range   aPTT 30  24 - 37 seconds  CBC     Status: Abnormal   Collection Time    11/20/13  8:10 AM      Result Value Ref Range   WBC 9.6  4.0 - 10.5 K/uL   RBC 4.51  3.87 - 5.11 MIL/uL    Hemoglobin 11.7 (*) 12.0 - 15.0 g/dL   HCT 36.4  36.0 - 46.0 %   MCV 80.7  78.0 - 100.0 fL  MCH 25.9 (*) 26.0 - 34.0 pg   MCHC 32.1  30.0 - 36.0 g/dL   RDW 15.3  11.5 - 15.5 %   Platelets 408 (*) 150 - 400 K/uL  PROTIME-INR     Status: None   Collection Time    11/20/13  8:10 AM      Result Value Ref Range   Prothrombin Time 12.4  11.6 - 15.2 seconds   INR 0.94  0.00 - 1.49   No results found.  Assessment/Plan Pancreatic cancer For Port placement Explained procedure, risks, complications, use of sedation. Labs reviewed. Consent signed in chart.   Ascencion Dike PA-C 11/20/2013, 9:10 AM

## 2013-11-20 NOTE — Discharge Instructions (Signed)
Moderate Sedation, Adult °Moderate sedation is given to help you relax or even sleep through a procedure. You may remain sleepy, be clumsy, or have poor balance for several hours following this procedure. Arrange for a responsible adult, family member, or friend to take you home. A responsible adult should stay with you for at least 24 hours or until the medicines have worn off. °· Do not participate in any activities where you could become injured for the next 24 hours, or until you feel normal again. Do not: °· Drive. °· Swim. °· Ride a bicycle. °· Operate heavy machinery. °· Cook. °· Use power tools. °· Climb ladders. °· Work at heights. °· Do not make important decisions or sign legal documents until you are improved. °· Vomiting may occur if you eat too soon. When you can drink without vomiting, try water, juice, or soup. Try solid foods if you feel little or no nausea. °· Only take over-the-counter or prescription medications for pain, discomfort, or fever as directed by your caregiver.If pain medications have been prescribed for you, ask your caregiver how soon it is safe to take them. °· Make sure you and your family fully understands everything about the medication given to you. Make sure you understand what side effects may occur. °· You should not drink alcohol, take sleeping pills, or medications that cause drowsiness for at least 24 hours. °· If you smoke, do not smoke alone. °· If you are feeling better, you may resume normal activities 24 hours after receiving sedation. °· Keep all appointments as scheduled. Follow all instructions. °· Ask questions if you do not understand. °SEEK MEDICAL CARE IF:  °· Your skin is pale or bluish in color. °· You continue to feel sick to your stomach (nauseous) or throw up (vomit). °· Your pain is getting worse and not helped by medication. °· You have bleeding or swelling. °· You are still sleepy or feeling clumsy after 24 hours. °SEEK IMMEDIATE MEDICAL CARE IF:   °· You develop a rash. °· You have difficulty breathing. °· You develop any type of allergic problem. °· You have a fever. °Document Released: 06/16/2001 Document Revised: 12/14/2011 Document Reviewed: 05/29/2013 °ExitCare® Patient Information ©2014 [employer]. °Implanted Port Home Guide °An implanted port is a type of central line that is placed under the skin. Central lines are used to provide IV access when treatment or nutrition needs to be given through a person's veins. Implanted ports are used for long-term IV access. An implanted port may be placed because:  °· You need IV medicine that would be irritating to the small veins in your hands or arms.   °· You need long-term IV medicines, such as antibiotics.   °· You need IV nutrition for a long period.   °· You need frequent blood draws for lab tests.   °· You need dialysis.   °Implanted ports are usually placed in the chest area, but they can also be placed in the upper arm, the abdomen, or the leg. An implanted port has two main parts:  °· Reservoir. The reservoir is round and will appear as a small, raised area under your skin. The reservoir is the part where a needle is inserted to give medicines or draw blood.   °· Catheter. The catheter is a thin, flexible tube that extends from the reservoir. The catheter is placed into a large vein. Medicine that is inserted into the reservoir goes into the catheter and then into the vein.   °HOW WILL I CARE FOR MY   INCISION SITE? °Do not get the incision site wet. Bathe or shower as directed by your health care provider.  °HOW IS MY PORT ACCESSED? °Special steps must be taken to access the port:  °· Before the port is accessed, a numbing cream can be placed on the skin. This helps numb the skin over the port site.   °· Your health care provider uses a sterile technique to access the port. °· Your health care provider must put on a mask and sterile gloves. °· The skin over your port is cleaned carefully with an  antiseptic and allowed to dry. °· The port is gently pinched between sterile gloves, and a needle is inserted into the port. °· Only "non-coring" port needles should be used to access the port. Once the port is accessed, a blood return should be checked. This helps ensure that the port is in the vein and is not clogged.   °· If your port needs to remain accessed for a constant infusion, a clear (transparent) bandage will be placed over the needle site. The bandage and needle will need to be changed every week, or as directed by your health care provider.   °· Keep the bandage covering the needle clean and dry. Do not get it wet. Follow your health care provider's instructions on how to take a shower or bath while the port is accessed.   °· If your port does not need to stay accessed, no bandage is needed over the port.   °WHAT IS FLUSHING? °Flushing helps keep the port from getting clogged. Follow your health care provider's instructions on how and when to flush the port. Ports are usually flushed with saline solution or a medicine called heparin. The need for flushing will depend on how the port is used.  °· If the port is used for intermittent medicines or blood draws, the port will need to be flushed:   °· After medicines have been given.   °· After blood has been drawn.   °· As part of routine maintenance.   °· If a constant infusion is running, the port may not need to be flushed.   °HOW LONG WILL MY PORT STAY IMPLANTED? °The port can stay in for as long as your health care provider thinks it is needed. When it is time for the port to come out, surgery will be done to remove it. The procedure is similar to the one performed when the port was put in.  °WHEN SHOULD I SEEK IMMEDIATE MEDICAL CARE? °When you have an implanted port, you should seek immediate medical care if:  °· You notice a bad smell coming from the incision site.   °· You have swelling, redness, or drainage at the incision site.   °· You have more  swelling or pain at the port site or the surrounding area.   °· You have a fever that is not controlled with medicine. °Document Released: 09/21/2005 Document Revised: 07/12/2013 Document Reviewed: 05/29/2013 °ExitCare® Patient Information ©2014 [employer]. ° °

## 2013-11-22 ENCOUNTER — Encounter: Payer: Self-pay | Admitting: Hematology & Oncology

## 2013-11-22 ENCOUNTER — Other Ambulatory Visit: Payer: Self-pay | Admitting: *Deleted

## 2013-11-22 ENCOUNTER — Ambulatory Visit (HOSPITAL_BASED_OUTPATIENT_CLINIC_OR_DEPARTMENT_OTHER): Payer: Medicare Other | Admitting: Hematology & Oncology

## 2013-11-22 ENCOUNTER — Ambulatory Visit (HOSPITAL_BASED_OUTPATIENT_CLINIC_OR_DEPARTMENT_OTHER): Payer: Medicare Other

## 2013-11-22 ENCOUNTER — Other Ambulatory Visit: Payer: Medicare Other | Admitting: Lab

## 2013-11-22 VITALS — BP 175/55 | HR 70 | Temp 98.1°F | Resp 14 | Ht 63.0 in | Wt 179.0 lb

## 2013-11-22 DIAGNOSIS — C259 Malignant neoplasm of pancreas, unspecified: Secondary | ICD-10-CM

## 2013-11-22 DIAGNOSIS — K8689 Other specified diseases of pancreas: Secondary | ICD-10-CM

## 2013-11-22 DIAGNOSIS — Z5111 Encounter for antineoplastic chemotherapy: Secondary | ICD-10-CM

## 2013-11-22 MED ORDER — LORAZEPAM 0.5 MG PO TABS: 0.5000 mg | ORAL_TABLET | Freq: Four times a day (QID) | ORAL | Status: DC | PRN

## 2013-11-22 MED ORDER — NAPROXEN SODIUM 220 MG PO TABS: 220.0000 mg | ORAL_TABLET | Freq: Two times a day (BID) | ORAL | Status: DC | PRN

## 2013-11-22 MED ORDER — SODIUM CHLORIDE 0.9 % IV SOLN
800.0000 mg/m2 | Freq: Once | INTRAVENOUS | Status: AC
  Administered 2013-11-22: 1520 mg via INTRAVENOUS
  Filled 2013-11-22: qty 39.98

## 2013-11-22 MED ORDER — CITALOPRAM HYDROBROMIDE 20 MG PO TABS: 20.0000 mg | ORAL_TABLET | Freq: Every evening | ORAL | Status: DC

## 2013-11-22 MED ORDER — PANCRELIPASE (LIP-PROT-AMYL) 12000-38000 UNITS PO CPEP: 1.0000 | ORAL_CAPSULE | Freq: Every day | ORAL | Status: DC

## 2013-11-22 MED ORDER — ALPRAZOLAM 0.25 MG PO TABS: 0.2500 mg | ORAL_TABLET | Freq: Two times a day (BID) | ORAL | Status: DC | PRN

## 2013-11-22 MED ORDER — HEPARIN SOD (PORK) LOCK FLUSH 100 UNIT/ML IV SOLN
500.0000 [IU] | Freq: Once | INTRAVENOUS | Status: AC | PRN
  Administered 2013-11-22: 500 [IU]
  Filled 2013-11-22: qty 5

## 2013-11-22 MED ORDER — PROCHLORPERAZINE MALEATE 10 MG PO TABS: 10.0000 mg | ORAL_TABLET | Freq: Four times a day (QID) | ORAL | Status: DC | PRN

## 2013-11-22 MED ORDER — PROCHLORPERAZINE MALEATE 10 MG PO TABS
ORAL_TABLET | ORAL | Status: AC
  Filled 2013-11-22: qty 1

## 2013-11-22 MED ORDER — PROCHLORPERAZINE MALEATE 10 MG PO TABS
10.0000 mg | ORAL_TABLET | Freq: Once | ORAL | Status: AC
  Administered 2013-11-22: 10 mg via ORAL

## 2013-11-22 MED ORDER — DOCUSATE SODIUM 100 MG PO CAPS: 100.0000 mg | ORAL_CAPSULE | Freq: Every day | ORAL | Status: DC

## 2013-11-22 MED ORDER — ONDANSETRON HCL 8 MG PO TABS
8.0000 mg | ORAL_TABLET | Freq: Two times a day (BID) | ORAL | Status: DC | PRN
Start: 2013-11-22 — End: 2013-12-26

## 2013-11-22 MED ORDER — SODIUM CHLORIDE 0.9 % IJ SOLN
10.0000 mL | INTRAMUSCULAR | Status: DC | PRN
  Administered 2013-11-22: 10 mL
  Filled 2013-11-22: qty 10

## 2013-11-22 MED ORDER — SODIUM CHLORIDE 0.9 % IV SOLN
Freq: Once | INTRAVENOUS | Status: AC
  Administered 2013-11-22: 13:00:00 via INTRAVENOUS

## 2013-11-22 NOTE — Progress Notes (Signed)
Hematology and Oncology Follow Up Visit  SWEETIE GIEBLER [id number] Mar 11, 1936 78 y.o. 11/22/2013   Principle Diagnosis:   Locally advanced pancreatic cancer  Current Therapy:    Patient to start weekly gemcitabine therapy     Interim History:  Ms.  Vanderford is back for followup. She will start chemotherapy today. I just want to have her in to make sure she is doing okay.  She had her Port-A-Cath placed. This was done without difficulty. She continues to look better. She's more active at home. F. her performance status is ECoG 1. Her last prealbumin was 12.5 which actually is up for her.  Her last CA 19-9 was 3.6. Level has never been high.  Medications: Current outpatient prescriptions:acetaminophen (TYLENOL) 325 MG tablet, Take 325 mg by mouth 3 (three) times daily. , Disp: , Rfl: ;  benzonatate (TESSALON) 100 MG capsule, Take 100 mg by mouth 3 (three) times daily as needed for cough., Disp: , Rfl: ;  Cholecalciferol (VITAMIN D) 400 UNITS capsule, Take 400 Units by mouth daily. , Disp: , Rfl: ;  esomeprazole (NEXIUM) 40 MG capsule, Take 40 mg by mouth daily before breakfast., Disp: , Rfl:  feeding supplement, RESOURCE BREEZE, (RESOURCE BREEZE) LIQD, Take 1 Container by mouth 2 (two) times daily between meals., Disp: , Rfl: ;  fentaNYL (DURAGESIC - DOSED MCG/HR) 25 MCG/HR patch, Place 25 mcg onto the skin every 3 (three) days. HOSPICE PATIENT, Disp: , Rfl: ;  furosemide (LASIX) 20 MG tablet, Take 20 mg by mouth every morning., Disp: , Rfl: ;  lactose free nutrition (BOOST) LIQD, Take 237 mLs by mouth daily., Disp: , Rfl:  lidocaine-prilocaine (EMLA) cream, Apply 1 application topically as needed. APPLY TO PORT SITE 1HR PRIOR TO CHEMO APPOINTMENT., Disp: , Rfl: ;  omeprazole (PRILOSEC) 20 MG capsule, Take 20 mg by mouth daily., Disp: , Rfl: ;  ondansetron (ZOFRAN) 8 MG tablet, Take by mouth every 8 (eight) hours as needed for nausea or vomiting (nausea)., Disp: , Rfl:  potassium chloride SA  (K-DUR,KLOR-CON) 20 MEQ tablet, Take 20 mEq by mouth 2 (two) times daily., Disp: , Rfl: ;  verapamil (CALAN) 120 MG tablet, Take 120 mg by mouth every morning., Disp: , Rfl: ;  ALPRAZolam (XANAX) 0.25 MG tablet, Take 1 tablet (0.25 mg total) by mouth 2 (two) times daily as needed for anxiety or sleep., Disp: 90 tablet, Rfl: 3 citalopram (CELEXA) 20 MG tablet, Take 1 tablet (20 mg total) by mouth every evening., Disp: 90 tablet, Rfl: 3;  docusate sodium (COLACE) 100 MG capsule, Take 1 capsule (100 mg total) by mouth daily., Disp: 90 capsule, Rfl: 3;  lipase/protease/amylase (CREON-12/PANCREASE) 12000 UNITS CPEP capsule, Take 1 capsule by mouth daily., Disp: 180 capsule, Rfl: 3 LORazepam (ATIVAN) 0.5 MG tablet, Take 1 tablet (0.5 mg total) by mouth every 6 (six) hours as needed (Nausea or vomiting)., Disp: 30 tablet, Rfl: 0;  naproxen sodium (ANAPROX) 220 MG tablet, Take 1 tablet (220 mg total) by mouth 2 (two) times daily as needed (pain)., Disp: 180 tablet, Rfl: 3;  ondansetron (ZOFRAN) 8 MG tablet, Take 1 tablet (8 mg total) by mouth 2 (two) times daily as needed (Nausea or vomiting)., Disp: 30 tablet, Rfl: 1 prochlorperazine (COMPAZINE) 10 MG tablet, Take 1 tablet (10 mg total) by mouth every 6 (six) hours as needed (Nausea or vomiting)., Disp: 30 tablet, Rfl: 1 No current facility-administered medications for this visit. Facility-Administered Medications Ordered in Other Visits: sodium chloride 0.9 % injection 10 mL,  10 mL, Intracatheter, PRN, Volanda Napoleon, MD, 10 mL at 11/22/13 1438  Allergies:  Allergies  Allergen Reactions  . Alka-Seltzer [Aspirin Effervescent] Swelling    Past Medical History, Surgical history, Social history, and Family History were reviewed and updated.  Review of Systems: As above  Physical Exam:  height is 5\' 3"  (1.6 m) and weight is 179 lb (81.194 kg). Her oral temperature is 98.1 F (36.7 C). Her blood pressure is 175/55 and her pulse is 70. Her respiration is 14.    Totally African female in no obvious distress. Her head and neck exam shows moist oral mucosa. There is no ocular lesions. There is no adenopathy in her neck. Lungs are clear. Cardiac exam regular rate rhythm. Abdomen is soft. It is nontender. There is no fluid wave. There is no palpable hepato- splenomegaly.  extremities shows no clubbing cyanosis or edema. Skin exam no rashes. Neurological exam no focal findings.  Lab Results  Component Value Date   WBC 9.6 11/20/2013   HGB 11.7* 11/20/2013   HCT 36.4 11/20/2013   MCV 80.7 11/20/2013   PLT 408* 11/20/2013     Chemistry      Component Value Date/Time   NA 135 11/10/2013 1115   NA 131* 09/08/2013 0547   K 3.4 11/10/2013 1115   K 3.5 09/08/2013 0547   CL 96* 11/10/2013 1115   CL 98 09/08/2013 0547   CO2 30 11/10/2013 1115   CO2 20 09/08/2013 0547   BUN 21 11/10/2013 1115   BUN 32* 09/08/2013 0547   CREATININE 0.6 11/10/2013 1115   CREATININE 1.23* 09/08/2013 0547      Component Value Date/Time   CALCIUM 9.9 11/10/2013 1115   CALCIUM 10.5 09/08/2013 0547   ALKPHOS 100* 11/10/2013 1115   ALKPHOS 134* 09/08/2013 0547   AST 26 11/10/2013 1115   AST 59* 09/08/2013 0547   ALT 14 11/10/2013 1115   ALT 65* 09/08/2013 0547   BILITOT 0.50 11/10/2013 1115   BILITOT 0.5 09/08/2013 0547         Impression and Plan: Ms. Gasser is a 78 year old African American female female with locally advanced pancreatic cancer. We tried neoadjuvant chemotherapy and radiation therapy to try to shrink it for surgery. This was not successful and she had very poor tolerance to combined therapy. She was hospitalized several times with complications.  She now is at a point where she is stronger. She knows that we cannot cure this without improvement works that we can help her.  We will go ahead with weekly chemotherapy with gemcitabine.  I'll plan to see her back myself in probably 3 or 4 weeks.  I would do 2 four-week cycles and then repeat her scans to see if we have her response.  If we do not, then we have to go with palliative and comfort care.   Volanda Napoleon, MD 2/18/20152:51 PM

## 2013-11-22 NOTE — Progress Notes (Unsigned)
The tip lies within the superior cavoatrial junction. The catheter aspirates and flushes normally and  is ready for immediate use.

## 2013-11-22 NOTE — Patient Instructions (Addendum)
[facility] Discharge Instructions for Patients Receiving Chemotherapy  Today you received the following chemotherapy agents Gemzar To help prevent nausea and vomiting after your treatment, we encourage you to take your nausea medication   1) Zofran  (Ondansetron)   Take 1 tablet (8 mg total) by mouth 2 (two) times daily as needed (Nausea or vomiting).  2)Compazine  (Prochlorperazine)10 mg by mouth every 6 hours as needed for nausea or vomiting,   3) Ativan (Lorazepam)- Take .5 mg tablet by mouth or under tongue every 6 hours for nausea, or for anxiety.     If you develop nausea and vomiting that is not controlled by your nausea medication, call the clinic [phone] If it is after clinic hours your family physician or the after hours number for the clinic or go to the Emergency Department.   BELOW ARE SYMPTOMS THAT SHOULD BE REPORTED IMMEDIATELY:  *FEVER GREATER THAN 100.5 F  *CHILLS WITH OR WITHOUT FEVER  NAUSEA AND VOMITING THAT IS NOT CONTROLLED WITH YOUR NAUSEA MEDICATION  *UNUSUAL SHORTNESS OF BREATH  *UNUSUAL BRUISING OR BLEEDING  TENDERNESS IN MOUTH AND THROAT WITH OR WITHOUT PRESENCE OF ULCERS  *URINARY PROBLEMS  *BOWEL PROBLEMS  UNUSUAL RASH Items with * indicate a potential emergency and should be followed up as soon as possible.  One of the nurses will contact you 24 hours after your treatment. Please let the nurse know about any problems that you may have experienced. Feel free to call the clinic you have any questions or concerns. The clinic phone number is [phone]   I have been informed and understand all the instructions given to me. I know to contact the clinic, my physician, or go to the Emergency Department if any problems should occur. I do not have any questions at this time, but understand that I may call the clinic during office hours   should I have any questions or need assistance in obtaining follow up  care.    __________________________________________  _____________  __________ Signature of Patient or Authorized Representative            Date                   Time    __________________________________________ Nurse's Signature    

## 2013-11-23 ENCOUNTER — Encounter: Payer: Self-pay | Admitting: Oncology

## 2013-11-23 NOTE — Progress Notes (Signed)
24 Hour Chemotherapy Follow up Call  Carolyn [name] called at home following administration of Gemzar- chemotherapy. Patient reports- no symptoms.   Reviewed all post chemotherapy instructions with patient and when should call clinic or MD.  Patient verbalized understanding.

## 2013-11-29 ENCOUNTER — Ambulatory Visit (HOSPITAL_BASED_OUTPATIENT_CLINIC_OR_DEPARTMENT_OTHER): Payer: Medicare Other

## 2013-11-29 ENCOUNTER — Other Ambulatory Visit (HOSPITAL_BASED_OUTPATIENT_CLINIC_OR_DEPARTMENT_OTHER): Payer: Medicare Other | Admitting: Lab

## 2013-11-29 VITALS — BP 145/85 | HR 82 | Temp 98.0°F

## 2013-11-29 DIAGNOSIS — C259 Malignant neoplasm of pancreas, unspecified: Secondary | ICD-10-CM

## 2013-11-29 DIAGNOSIS — Z5111 Encounter for antineoplastic chemotherapy: Secondary | ICD-10-CM

## 2013-11-29 DIAGNOSIS — K8689 Other specified diseases of pancreas: Secondary | ICD-10-CM

## 2013-11-29 LAB — CBC WITH DIFFERENTIAL (CANCER CENTER ONLY)
BASO#: 0 10*3/uL (ref 0.0–0.2)
BASO%: 0.5 % (ref 0.0–2.0)
EOS ABS: 0 10*3/uL (ref 0.0–0.5)
EOS%: 1 % (ref 0.0–7.0)
HCT: 32.8 % — ABNORMAL LOW (ref 34.8–46.6)
HGB: 10.2 g/dL — ABNORMAL LOW (ref 11.6–15.9)
LYMPH#: 1.2 10*3/uL (ref 0.9–3.3)
LYMPH%: 29.4 % (ref 14.0–48.0)
MCH: 25.6 pg — ABNORMAL LOW (ref 26.0–34.0)
MCHC: 31.1 g/dL — AB (ref 32.0–36.0)
MCV: 82 fL (ref 81–101)
MONO#: 0.2 10*3/uL (ref 0.1–0.9)
MONO%: 5.2 % (ref 0.0–13.0)
NEUT%: 63.9 % (ref 39.6–80.0)
NEUTROS ABS: 2.6 10*3/uL (ref 1.5–6.5)
PLATELETS: 232 10*3/uL (ref 145–400)
RBC: 3.99 10*6/uL (ref 3.70–5.32)
RDW: 14.9 % (ref 11.1–15.7)
WBC: 4 10*3/uL (ref 3.9–10.0)

## 2013-11-29 MED ORDER — SODIUM CHLORIDE 0.9 % IJ SOLN
10.0000 mL | INTRAMUSCULAR | Status: DC | PRN
Start: 2013-11-29 — End: 2013-11-29
  Administered 2013-11-29: 10 mL via INTRAVENOUS
  Filled 2013-11-29: qty 10

## 2013-11-29 MED ORDER — HEPARIN SOD (PORK) LOCK FLUSH 100 UNIT/ML IV SOLN
500.0000 [IU] | Freq: Once | INTRAVENOUS | Status: AC
  Administered 2013-11-29: 500 [IU] via INTRAVENOUS
  Filled 2013-11-29: qty 5

## 2013-11-29 MED ORDER — SODIUM CHLORIDE 0.9 % IV SOLN
Freq: Once | INTRAVENOUS | Status: AC
  Administered 2013-11-29: 12:00:00 via INTRAVENOUS

## 2013-11-29 MED ORDER — HEPARIN (PORCINE) LOCK FLUSH 10 UNIT/ML IV SOLN
10.0000 [IU] | Freq: Once | INTRAVENOUS | Status: DC
Start: 2013-11-29 — End: 2013-11-29

## 2013-11-29 MED ORDER — SODIUM CHLORIDE 0.9 % IV SOLN
800.0000 mg/m2 | Freq: Once | INTRAVENOUS | Status: AC
  Administered 2013-11-29: 1520 mg via INTRAVENOUS
  Filled 2013-11-29: qty 39.98

## 2013-11-29 MED ORDER — PROCHLORPERAZINE MALEATE 10 MG PO TABS
10.0000 mg | ORAL_TABLET | Freq: Once | ORAL | Status: AC
  Administered 2013-11-29: 10 mg via ORAL

## 2013-11-29 NOTE — Patient Instructions (Addendum)
[facility] Discharge Instructions for Patients Receiving Chemotherapy  Today you received the following chemotherapy agents Gemcitabine  To help prevent nausea and vomiting after your treatment, we encourage you to take your nausea medication    If you develop nausea and vomiting that is not controlled by your nausea medication, call the clinic.   BELOW ARE SYMPTOMS THAT SHOULD BE REPORTED IMMEDIATELY:  *FEVER GREATER THAN 100.5 F  *CHILLS WITH OR WITHOUT FEVER  NAUSEA AND VOMITING THAT IS NOT CONTROLLED WITH YOUR NAUSEA MEDICATION  *UNUSUAL SHORTNESS OF BREATH  *UNUSUAL BRUISING OR BLEEDING  TENDERNESS IN MOUTH AND THROAT WITH OR WITHOUT PRESENCE OF ULCERS  *URINARY PROBLEMS  *BOWEL PROBLEMS  UNUSUAL RASH Items with * indicate a potential emergency and should be followed up as soon as possible.  Feel free to call the clinic you have any questions or concerns. The clinic phone number is [phone].   Implanted [facility] Guide An implanted port is a type of central line that is placed under the skin. Central lines are used to provide IV access when treatment or nutrition needs to be given through a person's veins. Implanted ports are used for long-term IV access. An implanted port may be placed because:   You need IV medicine that would be irritating to the small veins in your hands or arms.   You need long-term IV medicines, such as antibiotics.   You need IV nutrition for a long period.   You need frequent blood draws for lab tests.   You need dialysis.  Implanted ports are usually placed in the chest area, but they can also be placed in the upper arm, the abdomen, or the leg. An implanted port has two main parts:   Reservoir. The reservoir is round and will appear as a small, raised area under your skin. The reservoir is the part where a needle is inserted to give medicines or draw blood.   Catheter. The catheter is a thin, flexible tube  that extends from the reservoir. The catheter is placed into a large vein. Medicine that is inserted into the reservoir goes into the catheter and then into the vein.  HOW WILL I CARE FOR MY INCISION SITE? Do not get the incision site wet. Bathe or shower as directed by your health care provider.  HOW IS MY PORT ACCESSED? Special steps must be taken to access the port:   Before the port is accessed, a numbing cream can be placed on the skin. This helps numb the skin over the port site.   Your health care provider uses a sterile technique to access the port.  Your health care provider must put on a mask and sterile gloves.  The skin over your port is cleaned carefully with an antiseptic and allowed to dry.  The port is gently pinched between sterile gloves, and a needle is inserted into the port.  Only "non-coring" port needles should be used to access the port. Once the port is accessed, a blood return should be checked. This helps ensure that the port is in the vein and is not clogged.   If your port needs to remain accessed for a constant infusion, a clear (transparent) bandage will be placed over the needle site. The bandage and needle will need to be changed every week, or as directed by your health care provider.   Keep the bandage covering the needle clean and dry. Do not get it wet. Follow your health  care provider's instructions on how to take a shower or bath while the port is accessed.   If your port does not need to stay accessed, no bandage is needed over the port.  WHAT IS FLUSHING? Flushing helps keep the port from getting clogged. Follow your health care provider's instructions on how and when to flush the port. Ports are usually flushed with saline solution or a medicine called heparin. The need for flushing will depend on how the port is used.   If the port is used for intermittent medicines or blood draws, the port will need to be flushed:   After medicines have  been given.   After blood has been drawn.   As part of routine maintenance.   If a constant infusion is running, the port may not need to be flushed.  HOW LONG WILL MY PORT STAY IMPLANTED? The port can stay in for as long as your health care provider thinks it is needed. When it is time for the port to come out, surgery will be done to remove it. The procedure is similar to the one performed when the port was put in.  WHEN SHOULD I SEEK IMMEDIATE MEDICAL CARE? When you have an implanted port, you should seek immediate medical care if:   You notice a bad smell coming from the incision site.   You have swelling, redness, or drainage at the incision site.   You have more swelling or pain at the port site or the surrounding area.   You have a fever that is not controlled with medicine. Document Released: 09/21/2005 Document Revised: 07/12/2013 Document Reviewed: 05/29/2013 [facility] Patient Information 2014 [employer].

## 2013-12-06 ENCOUNTER — Ambulatory Visit (HOSPITAL_BASED_OUTPATIENT_CLINIC_OR_DEPARTMENT_OTHER): Payer: Medicare Other

## 2013-12-06 ENCOUNTER — Other Ambulatory Visit (HOSPITAL_BASED_OUTPATIENT_CLINIC_OR_DEPARTMENT_OTHER): Payer: Medicare Other | Admitting: Lab

## 2013-12-06 ENCOUNTER — Other Ambulatory Visit: Payer: Self-pay | Admitting: *Deleted

## 2013-12-06 VITALS — BP 147/71 | HR 81 | Temp 97.7°F | Resp 16

## 2013-12-06 DIAGNOSIS — C259 Malignant neoplasm of pancreas, unspecified: Secondary | ICD-10-CM

## 2013-12-06 DIAGNOSIS — R7989 Other specified abnormal findings of blood chemistry: Secondary | ICD-10-CM

## 2013-12-06 LAB — CMP (CANCER CENTER ONLY)
ALBUMIN: 2.3 g/dL — AB (ref 3.3–5.5)
ALK PHOS: 133 U/L — AB (ref 26–84)
ALT: 68 U/L — AB (ref 10–47)
AST: 176 U/L — ABNORMAL HIGH (ref 11–38)
BUN, Bld: 26 mg/dL — ABNORMAL HIGH (ref 7–22)
CO2: 29 meq/L (ref 18–33)
Calcium: 9 mg/dL (ref 8.0–10.3)
Chloride: 97 mEq/L — ABNORMAL LOW (ref 98–108)
Creat: 1.2 mg/dl (ref 0.6–1.2)
Glucose, Bld: 125 mg/dL — ABNORMAL HIGH (ref 73–118)
POTASSIUM: 3.2 meq/L — AB (ref 3.3–4.7)
SODIUM: 132 meq/L (ref 128–145)
TOTAL PROTEIN: 7.3 g/dL (ref 6.4–8.1)
Total Bilirubin: 0.5 mg/dl (ref 0.20–1.60)

## 2013-12-06 LAB — CBC WITH DIFFERENTIAL (CANCER CENTER ONLY)
BASO#: 0 10*3/uL (ref 0.0–0.2)
BASO%: 0.1 % (ref 0.0–2.0)
EOS%: 0.1 % (ref 0.0–7.0)
Eosinophils Absolute: 0 10*3/uL (ref 0.0–0.5)
HCT: 27.9 % — ABNORMAL LOW (ref 34.8–46.6)
HGB: 8.7 g/dL — ABNORMAL LOW (ref 11.6–15.9)
LYMPH#: 0.9 10*3/uL (ref 0.9–3.3)
LYMPH%: 11.5 % — ABNORMAL LOW (ref 14.0–48.0)
MCH: 25.2 pg — ABNORMAL LOW (ref 26.0–34.0)
MCHC: 31.2 g/dL — ABNORMAL LOW (ref 32.0–36.0)
MCV: 81 fL (ref 81–101)
MONO#: 0.1 10*3/uL (ref 0.1–0.9)
MONO%: 1.9 % (ref 0.0–13.0)
NEUT%: 86.4 % — ABNORMAL HIGH (ref 39.6–80.0)
NEUTROS ABS: 6.4 10*3/uL (ref 1.5–6.5)
PLATELETS: 84 10*3/uL — AB (ref 145–400)
RBC: 3.45 10*6/uL — AB (ref 3.70–5.32)
RDW: 14.7 % (ref 11.1–15.7)
WBC: 7.4 10*3/uL (ref 3.9–10.0)

## 2013-12-06 MED ORDER — POTASSIUM CHLORIDE CRYS ER 20 MEQ PO TBCR: 20.0000 meq | EXTENDED_RELEASE_TABLET | Freq: Two times a day (BID) | ORAL | Status: DC

## 2013-12-06 MED ORDER — SODIUM CHLORIDE 0.9 % IV SOLN
Freq: Once | INTRAVENOUS | Status: AC
  Administered 2013-12-06: 12:00:00 via INTRAVENOUS

## 2013-12-06 MED ORDER — PROCHLORPERAZINE MALEATE 10 MG PO TABS
ORAL_TABLET | ORAL | Status: AC
  Filled 2013-12-06: qty 1

## 2013-12-06 NOTE — Progress Notes (Signed)
Pt will not receive chemotherapy today due to low blood counts per Dr. Marin Olp

## 2013-12-12 ENCOUNTER — Other Ambulatory Visit: Payer: Self-pay | Admitting: Nurse Practitioner

## 2013-12-12 DIAGNOSIS — C250 Malignant neoplasm of head of pancreas: Secondary | ICD-10-CM

## 2013-12-12 DIAGNOSIS — C259 Malignant neoplasm of pancreas, unspecified: Secondary | ICD-10-CM

## 2013-12-13 ENCOUNTER — Ambulatory Visit (HOSPITAL_BASED_OUTPATIENT_CLINIC_OR_DEPARTMENT_OTHER): Payer: Medicare Other

## 2013-12-13 ENCOUNTER — Ambulatory Visit: Payer: Medicare Other

## 2013-12-13 ENCOUNTER — Other Ambulatory Visit (HOSPITAL_BASED_OUTPATIENT_CLINIC_OR_DEPARTMENT_OTHER): Payer: Medicare Other | Admitting: Lab

## 2013-12-13 ENCOUNTER — Encounter: Payer: Self-pay | Admitting: Hematology & Oncology

## 2013-12-13 ENCOUNTER — Ambulatory Visit: Payer: Medicare Other | Admitting: Hematology & Oncology

## 2013-12-13 ENCOUNTER — Ambulatory Visit (HOSPITAL_BASED_OUTPATIENT_CLINIC_OR_DEPARTMENT_OTHER): Payer: Medicare Other | Admitting: Hematology & Oncology

## 2013-12-13 ENCOUNTER — Other Ambulatory Visit: Payer: Medicare Other | Admitting: Lab

## 2013-12-13 VITALS — BP 183/55 | HR 74 | Temp 98.2°F | Resp 14 | Ht 63.0 in | Wt 183.0 lb

## 2013-12-13 DIAGNOSIS — C259 Malignant neoplasm of pancreas, unspecified: Secondary | ICD-10-CM

## 2013-12-13 DIAGNOSIS — K8689 Other specified diseases of pancreas: Secondary | ICD-10-CM

## 2013-12-13 DIAGNOSIS — C250 Malignant neoplasm of head of pancreas: Secondary | ICD-10-CM

## 2013-12-13 DIAGNOSIS — Z5111 Encounter for antineoplastic chemotherapy: Secondary | ICD-10-CM

## 2013-12-13 LAB — CMP (CANCER CENTER ONLY)
ALK PHOS: 152 U/L — AB (ref 26–84)
ALT(SGPT): 56 U/L — ABNORMAL HIGH (ref 10–47)
AST: 77 U/L — ABNORMAL HIGH (ref 11–38)
Albumin: 2.3 g/dL — ABNORMAL LOW (ref 3.3–5.5)
BUN: 17 mg/dL (ref 7–22)
CO2: 32 meq/L (ref 18–33)
Calcium: 8.7 mg/dL (ref 8.0–10.3)
Chloride: 97 mEq/L — ABNORMAL LOW (ref 98–108)
Creat: 1.1 mg/dl (ref 0.6–1.2)
GLUCOSE: 195 mg/dL — AB (ref 73–118)
POTASSIUM: 3 meq/L — AB (ref 3.3–4.7)
Sodium: 136 mEq/L (ref 128–145)
TOTAL PROTEIN: 7 g/dL (ref 6.4–8.1)
Total Bilirubin: 0.4 mg/dl (ref 0.20–1.60)

## 2013-12-13 LAB — CBC WITH DIFFERENTIAL (CANCER CENTER ONLY)
BASO#: 0 10*3/uL (ref 0.0–0.2)
BASO%: 0.4 % (ref 0.0–2.0)
EOS ABS: 0.1 10*3/uL (ref 0.0–0.5)
EOS%: 1.4 % (ref 0.0–7.0)
HCT: 31.6 % — ABNORMAL LOW (ref 34.8–46.6)
HGB: 9.9 g/dL — ABNORMAL LOW (ref 11.6–15.9)
LYMPH#: 1.5 10*3/uL (ref 0.9–3.3)
LYMPH%: 22.3 % (ref 14.0–48.0)
MCH: 25.8 pg — ABNORMAL LOW (ref 26.0–34.0)
MCHC: 31.3 g/dL — ABNORMAL LOW (ref 32.0–36.0)
MCV: 82 fL (ref 81–101)
MONO#: 0.8 10*3/uL (ref 0.1–0.9)
MONO%: 11.8 % (ref 0.0–13.0)
NEUT%: 64.1 % (ref 39.6–80.0)
NEUTROS ABS: 4.4 10*3/uL (ref 1.5–6.5)
PLATELETS: 763 10*3/uL — AB (ref 145–400)
RBC: 3.84 10*6/uL (ref 3.70–5.32)
RDW: 16.7 % — ABNORMAL HIGH (ref 11.1–15.7)
WBC: 6.9 10*3/uL (ref 3.9–10.0)

## 2013-12-13 LAB — TECHNOLOGIST REVIEW CHCC SATELLITE

## 2013-12-13 MED ORDER — SODIUM CHLORIDE 0.9 % IV SOLN
800.0000 mg/m2 | Freq: Once | INTRAVENOUS | Status: AC
  Administered 2013-12-13: 1520 mg via INTRAVENOUS
  Filled 2013-12-13: qty 39.98

## 2013-12-13 MED ORDER — SODIUM CHLORIDE 0.9 % IJ SOLN
10.0000 mL | INTRAMUSCULAR | Status: DC | PRN
  Administered 2013-12-13: 10 mL
  Filled 2013-12-13: qty 10

## 2013-12-13 MED ORDER — HEPARIN SOD (PORK) LOCK FLUSH 100 UNIT/ML IV SOLN
500.0000 [IU] | Freq: Once | INTRAVENOUS | Status: AC | PRN
  Administered 2013-12-13: 500 [IU]
  Filled 2013-12-13: qty 5

## 2013-12-13 MED ORDER — PROCHLORPERAZINE MALEATE 10 MG PO TABS
ORAL_TABLET | ORAL | Status: AC
  Filled 2013-12-13: qty 1

## 2013-12-13 MED ORDER — PROCHLORPERAZINE MALEATE 10 MG PO TABS
10.0000 mg | ORAL_TABLET | Freq: Once | ORAL | Status: AC
  Administered 2013-12-13: 10 mg via ORAL

## 2013-12-13 MED ORDER — SODIUM CHLORIDE 0.9 % IV SOLN
Freq: Once | INTRAVENOUS | Status: AC
  Administered 2013-12-13: 10:00:00 via INTRAVENOUS

## 2013-12-13 NOTE — Patient Instructions (Signed)
[facility] Discharge Instructions for Patients Receiving Chemotherapy  Today you received the following chemotherapy agents Gemzar To help prevent nausea and vomiting after your treatment, we encourage you to take your nausea medication   1) Zofran  (Ondansetron)   Take 1 tablet (8 mg total) by mouth 2 (two) times daily as needed (Nausea or vomiting).  2)Compazine  (Prochlorperazine)10 mg by mouth every 6 hours as needed for nausea or vomiting,   3) Ativan (Lorazepam)- Take .5 mg tablet by mouth or under tongue every 6 hours for nausea, or for anxiety.     If you develop nausea and vomiting that is not controlled by your nausea medication, call the clinic [phone] If it is after clinic hours your family physician or the after hours number for the clinic or go to the Emergency Department.   BELOW ARE SYMPTOMS THAT SHOULD BE REPORTED IMMEDIATELY:  *FEVER GREATER THAN 100.5 F  *CHILLS WITH OR WITHOUT FEVER  NAUSEA AND VOMITING THAT IS NOT CONTROLLED WITH YOUR NAUSEA MEDICATION  *UNUSUAL SHORTNESS OF BREATH  *UNUSUAL BRUISING OR BLEEDING  TENDERNESS IN MOUTH AND THROAT WITH OR WITHOUT PRESENCE OF ULCERS  *URINARY PROBLEMS  *BOWEL PROBLEMS  UNUSUAL RASH Items with * indicate a potential emergency and should be followed up as soon as possible.  One of the nurses will contact you 24 hours after your treatment. Please let the nurse know about any problems that you may have experienced. Feel free to call the clinic you have any questions or concerns. The clinic phone number is [phone]   I have been informed and understand all the instructions given to me. I know to contact the clinic, my physician, or go to the Emergency Department if any problems should occur. I do not have any questions at this time, but understand that I may call the clinic during office hours   should I have any questions or need assistance in obtaining follow up  care.    __________________________________________  _____________  __________ Signature of Patient or Authorized Representative            Date                   Time    __________________________________________ Nurse's Signature

## 2013-12-13 NOTE — Progress Notes (Signed)
Hematology and Oncology Follow Up Visit  Carolyn [name] [id number] 11/11/1935 78 y.o. 12/13/2013   Principle Diagnosis:   Locally advanced pancreatic adenocarcinoma  Current Therapy:    Status post 2 weeks of gemcitabine     Interim History:  Ms.  [name] is back for followup. We held her chemotherapy last week. She had low blood counts.  She's doing better now. She's eating a little bit better now. She is having no abdominal pain. She is on a fentanyl patch.  She has had no diarrhea. There is no bleeding. She's had no fever. She's had no cough. There's been no leg swelling. She's had no rash.  Medications: Current outpatient prescriptions:acetaminophen (TYLENOL) 325 MG tablet, Take 325 mg by mouth 3 (three) times daily. , Disp: , Rfl: ;  ALPRAZolam (XANAX) 0.25 MG tablet, Take 1 tablet (0.25 mg total) by mouth 2 (two) times daily as needed for anxiety or sleep., Disp: 90 tablet, Rfl: 3;  Cholecalciferol (VITAMIN D) 400 UNITS capsule, Take 400 Units by mouth daily. , Disp: , Rfl:  citalopram (CELEXA) 20 MG tablet, Take 1 tablet (20 mg total) by mouth every evening., Disp: 90 tablet, Rfl: 3;  docusate sodium (COLACE) 100 MG capsule, Take 1 capsule (100 mg total) by mouth daily., Disp: 90 capsule, Rfl: 3;  feeding supplement, RESOURCE BREEZE, (RESOURCE BREEZE) LIQD, Take 1 Container by mouth 2 (two) times daily between meals., Disp: , Rfl:  fentaNYL (DURAGESIC - DOSED MCG/HR) 25 MCG/HR patch, Place 25 mcg onto the skin every 3 (three) days. HOSPICE PATIENT, Disp: , Rfl: ;  furosemide (LASIX) 20 MG tablet, Take 20 mg by mouth every morning., Disp: , Rfl: ;  lactose free nutrition (BOOST) LIQD, Take 237 mLs by mouth daily., Disp: , Rfl: ;  lidocaine-prilocaine (EMLA) cream, Apply 1 application topically as needed. APPLY TO PORT SITE 1HR PRIOR TO CHEMO APPOINTMENT., Disp: , Rfl:  lipase/protease/amylase (CREON-12/PANCREASE) 12000 UNITS CPEP capsule, Take 1 capsule by mouth daily., Disp: 180 capsule,  Rfl: 3;  LORazepam (ATIVAN) 0.5 MG tablet, Take 1 tablet (0.5 mg total) by mouth every 6 (six) hours as needed (Nausea or vomiting)., Disp: 30 tablet, Rfl: 0;  naproxen sodium (ANAPROX) 220 MG tablet, Take 1 tablet (220 mg total) by mouth 2 (two) times daily as needed (pain)., Disp: 180 tablet, Rfl: 3 omeprazole (PRILOSEC) 20 MG capsule, Take 20 mg by mouth daily., Disp: , Rfl: ;  ondansetron (ZOFRAN) 8 MG tablet, Take 1 tablet (8 mg total) by mouth 2 (two) times daily as needed (Nausea or vomiting)., Disp: 30 tablet, Rfl: 1;  potassium chloride SA (K-DUR,KLOR-CON) 20 MEQ tablet, Take 1 tablet (20 mEq total) by mouth 2 (two) times daily., Disp: 180 tablet, Rfl: 3 prochlorperazine (COMPAZINE) 10 MG tablet, Take 1 tablet (10 mg total) by mouth every 6 (six) hours as needed (Nausea or vomiting)., Disp: 30 tablet, Rfl: 1;  verapamil (CALAN) 120 MG tablet, Take 120 mg by mouth every morning., Disp: , Rfl:  No current facility-administered medications for this visit. Facility-Administered Medications Ordered in Other Visits: sodium chloride 0.9 % injection 10 mL, 10 mL, Intracatheter, PRN, Volanda Napoleon, MD, 10 mL at 11/22/13 1438;  sodium chloride 0.9 % injection 10 mL, 10 mL, Intracatheter, PRN, Volanda Napoleon, MD, 10 mL at 12/13/13 1209  Allergies:  Allergies  Allergen Reactions  . Alka-Seltzer [Aspirin Effervescent] Swelling    Past Medical History, Surgical history, Social history, and Family History were reviewed and updated.  Review of Systems:  Above  Physical Exam:  height is 5\' 3"  (1.6 m) and weight is 183 lb (83.008 kg). Her oral temperature is 98.2 F (36.8 C). Her blood pressure is 183/55 and her pulse is 74. Her respiration is 14.   Lungs are clear. Cardiac exam regular rate and rhythm. Abdomen soft. There is no fluid wave. There is no guarding or rebound tenderness. No palpable abdominal mass. No palpable liver or spleen tip. Oral exam is negative for mucositis. Neck is supple. No  adenopathy. Extremities shows no clubbing cyanosis or edema. She has good strength. Skin exam no rashes. Skin is moist. No ecchymoses. Neurological exam is nonfocal.  Lab Results  Component Value Date   WBC 6.9 12/13/2013   HGB 9.9* 12/13/2013   HCT 31.6* 12/13/2013   MCV 82 12/13/2013   PLT 763* 12/13/2013     Chemistry      Component Value Date/Time   NA 136 12/13/2013 0925   NA 131* 09/08/2013 0547   K 3.0* 12/13/2013 0925   K 3.5 09/08/2013 0547   CL 97* 12/13/2013 0925   CL 98 09/08/2013 0547   CO2 32 12/13/2013 0925   CO2 20 09/08/2013 0547   BUN 17 12/13/2013 0925   BUN 32* 09/08/2013 0547   CREATININE 1.1 12/13/2013 0925   CREATININE 1.23* 09/08/2013 0547      Component Value Date/Time   CALCIUM 8.7 12/13/2013 0925   CALCIUM 10.5 09/08/2013 0547   ALKPHOS 152* 12/13/2013 0925   ALKPHOS 134* 09/08/2013 0547   AST 77* 12/13/2013 0925   AST 59* 09/08/2013 0547   ALT 56* 12/13/2013 0925   ALT 65* 09/08/2013 0547   BILITOT 0.40 12/13/2013 0925   BILITOT 0.5 09/08/2013 0547         Impression and Plan: [name] is 59 year old African [place] female. She has a locally advanced pancreatic cancer. We tried for neoadjuvant therapy which he tolerated poorly. She declined and performance status. We cannot treat her for several months. She now improved and we are trying her on systemic chemotherapy with single agent gemcitabine.  We will treat her with week 3 today. If her blood counts are okay, we will go with week 4 next week.  I will then give her a 2 week break. We will then do 3 weeks of gemcitabine and then rescan to see if she responded.  She and her husband understand this.  Volanda Napoleon, MD 3/11/201512:20 PM

## 2013-12-14 LAB — CANCER ANTIGEN 19-9: CA 19 9: 5.8 U/mL (ref <35.0)

## 2013-12-14 LAB — PREALBUMIN: PREALBUMIN: 8.9 mg/dL — AB (ref 17.0–34.0)

## 2013-12-15 ENCOUNTER — Other Ambulatory Visit: Payer: Self-pay | Admitting: *Deleted

## 2013-12-15 ENCOUNTER — Encounter: Payer: Self-pay | Admitting: *Deleted

## 2013-12-15 DIAGNOSIS — C259 Malignant neoplasm of pancreas, unspecified: Secondary | ICD-10-CM

## 2013-12-15 MED ORDER — FENTANYL 25 MCG/HR TD PT72: 25.0000 ug | MEDICATED_PATCH | TRANSDERMAL | Status: DC

## 2013-12-21 ENCOUNTER — Other Ambulatory Visit (HOSPITAL_BASED_OUTPATIENT_CLINIC_OR_DEPARTMENT_OTHER): Payer: Medicare Other | Admitting: Lab

## 2013-12-21 ENCOUNTER — Ambulatory Visit (HOSPITAL_BASED_OUTPATIENT_CLINIC_OR_DEPARTMENT_OTHER): Payer: Medicare Other

## 2013-12-21 VITALS — BP 156/68 | HR 84 | Temp 97.0°F | Resp 16

## 2013-12-21 DIAGNOSIS — C250 Malignant neoplasm of head of pancreas: Secondary | ICD-10-CM

## 2013-12-21 DIAGNOSIS — K8689 Other specified diseases of pancreas: Secondary | ICD-10-CM

## 2013-12-21 DIAGNOSIS — C259 Malignant neoplasm of pancreas, unspecified: Secondary | ICD-10-CM

## 2013-12-21 DIAGNOSIS — Z5111 Encounter for antineoplastic chemotherapy: Secondary | ICD-10-CM

## 2013-12-21 LAB — CBC WITH DIFFERENTIAL (CANCER CENTER ONLY)
BASO#: 0 10*3/uL (ref 0.0–0.2)
BASO%: 0.3 % (ref 0.0–2.0)
EOS ABS: 0 10*3/uL (ref 0.0–0.5)
EOS%: 0.5 % (ref 0.0–7.0)
HCT: 28 % — ABNORMAL LOW (ref 34.8–46.6)
HEMOGLOBIN: 8.7 g/dL — AB (ref 11.6–15.9)
LYMPH#: 2.1 10*3/uL (ref 0.9–3.3)
LYMPH%: 28.3 % (ref 14.0–48.0)
MCH: 26.3 pg (ref 26.0–34.0)
MCHC: 31.1 g/dL — AB (ref 32.0–36.0)
MCV: 85 fL (ref 81–101)
MONO#: 0.9 10*3/uL (ref 0.1–0.9)
MONO%: 11.6 % (ref 0.0–13.0)
NEUT%: 59.3 % (ref 39.6–80.0)
NEUTROS ABS: 4.5 10*3/uL (ref 1.5–6.5)
Platelets: 651 10*3/uL — ABNORMAL HIGH (ref 145–400)
RBC: 3.31 10*6/uL — ABNORMAL LOW (ref 3.70–5.32)
RDW: 17.8 % — ABNORMAL HIGH (ref 11.1–15.7)
WBC: 6.9 10*3/uL (ref 3.9–10.0)

## 2013-12-21 LAB — CMP (CANCER CENTER ONLY)
ALK PHOS: 156 U/L — AB (ref 26–84)
ALT(SGPT): 42 U/L (ref 10–47)
AST: 83 U/L — AB (ref 11–38)
Albumin: 2.1 g/dL — ABNORMAL LOW (ref 3.3–5.5)
BILIRUBIN TOTAL: 0.4 mg/dL (ref 0.20–1.60)
BUN: 17 mg/dL (ref 7–22)
CO2: 31 mEq/L (ref 18–33)
CREATININE: 1.2 mg/dL (ref 0.6–1.2)
Calcium: 8.6 mg/dL (ref 8.0–10.3)
Chloride: 100 mEq/L (ref 98–108)
GLUCOSE: 169 mg/dL — AB (ref 73–118)
POTASSIUM: 3.3 meq/L (ref 3.3–4.7)
Sodium: 138 mEq/L (ref 128–145)
Total Protein: 6.6 g/dL (ref 6.4–8.1)

## 2013-12-21 LAB — TECHNOLOGIST REVIEW CHCC SATELLITE

## 2013-12-21 MED ORDER — SODIUM CHLORIDE 0.9 % IJ SOLN
10.0000 mL | INTRAMUSCULAR | Status: DC | PRN
  Administered 2013-12-21: 10 mL
  Filled 2013-12-21: qty 10

## 2013-12-21 MED ORDER — SODIUM CHLORIDE 0.9 % IV SOLN
800.0000 mg/m2 | Freq: Once | INTRAVENOUS | Status: AC
  Administered 2013-12-21: 1520 mg via INTRAVENOUS
  Filled 2013-12-21: qty 39.98

## 2013-12-21 MED ORDER — PROCHLORPERAZINE MALEATE 10 MG PO TABS
ORAL_TABLET | ORAL | Status: AC
  Filled 2013-12-21: qty 1

## 2013-12-21 MED ORDER — SODIUM CHLORIDE 0.9 % IV SOLN
Freq: Once | INTRAVENOUS | Status: AC
  Administered 2013-12-21: 13:00:00 via INTRAVENOUS

## 2013-12-21 MED ORDER — HEPARIN SOD (PORK) LOCK FLUSH 100 UNIT/ML IV SOLN
500.0000 [IU] | Freq: Once | INTRAVENOUS | Status: AC | PRN
  Administered 2013-12-21: 500 [IU]
  Filled 2013-12-21: qty 5

## 2013-12-21 MED ORDER — PROCHLORPERAZINE MALEATE 10 MG PO TABS
10.0000 mg | ORAL_TABLET | Freq: Once | ORAL | Status: AC
  Administered 2013-12-21: 10 mg via ORAL

## 2013-12-21 NOTE — Progress Notes (Signed)
Ok to treat despite hgb 8.7 per Dr. Marin Olp

## 2013-12-21 NOTE — Patient Instructions (Signed)
[facility] for Patients Receiving Chemotherapy  Today you received the following chemotherapy agents Gemzar.  To help prevent nausea and vomiting after your treatment, we encourage you to take your nausea medication as prescribed.   If you develop nausea and vomiting that is not controlled by your nausea medication, call the clinic.   BELOW ARE SYMPTOMS THAT SHOULD BE REPORTED IMMEDIATELY:  *FEVER GREATER THAN 100.5 F  *CHILLS WITH OR WITHOUT FEVER  NAUSEA AND VOMITING THAT IS NOT CONTROLLED WITH YOUR NAUSEA MEDICATION  *UNUSUAL SHORTNESS OF BREATH  *UNUSUAL BRUISING OR BLEEDING  TENDERNESS IN MOUTH AND THROAT WITH OR WITHOUT PRESENCE OF ULCERS  *URINARY PROBLEMS  *BOWEL PROBLEMS  UNUSUAL RASH Items with * indicate a potential emergency and should be followed up as soon as possible.  Feel free to call the clinic you have any questions or concerns. The clinic phone number is [phone].    

## 2013-12-26 ENCOUNTER — Other Ambulatory Visit: Payer: Self-pay | Admitting: *Deleted

## 2013-12-26 DIAGNOSIS — K8689 Other specified diseases of pancreas: Secondary | ICD-10-CM

## 2013-12-26 DIAGNOSIS — C259 Malignant neoplasm of pancreas, unspecified: Secondary | ICD-10-CM

## 2013-12-26 MED ORDER — PROCHLORPERAZINE MALEATE 10 MG PO TABS: 10.0000 mg | ORAL_TABLET | Freq: Four times a day (QID) | ORAL | Status: DC | PRN

## 2013-12-26 MED ORDER — ONDANSETRON HCL 8 MG PO TABS: 8.0000 mg | ORAL_TABLET | Freq: Two times a day (BID) | ORAL | Status: DC | PRN

## 2014-01-10 ENCOUNTER — Ambulatory Visit (HOSPITAL_BASED_OUTPATIENT_CLINIC_OR_DEPARTMENT_OTHER): Payer: Medicare Other

## 2014-01-10 ENCOUNTER — Other Ambulatory Visit: Payer: Self-pay | Admitting: *Deleted

## 2014-01-10 ENCOUNTER — Encounter: Payer: Self-pay | Admitting: Hematology & Oncology

## 2014-01-10 ENCOUNTER — Other Ambulatory Visit (HOSPITAL_BASED_OUTPATIENT_CLINIC_OR_DEPARTMENT_OTHER): Payer: Medicare Other | Admitting: Lab

## 2014-01-10 ENCOUNTER — Ambulatory Visit (HOSPITAL_BASED_OUTPATIENT_CLINIC_OR_DEPARTMENT_OTHER): Payer: Medicare Other | Admitting: Hematology & Oncology

## 2014-01-10 VITALS — BP 163/74 | HR 86 | Temp 98.8°F | Resp 20 | Ht 64.0 in | Wt 190.0 lb

## 2014-01-10 DIAGNOSIS — C259 Malignant neoplasm of pancreas, unspecified: Secondary | ICD-10-CM

## 2014-01-10 DIAGNOSIS — Z5111 Encounter for antineoplastic chemotherapy: Secondary | ICD-10-CM

## 2014-01-10 DIAGNOSIS — C250 Malignant neoplasm of head of pancreas: Secondary | ICD-10-CM

## 2014-01-10 DIAGNOSIS — K8689 Other specified diseases of pancreas: Secondary | ICD-10-CM

## 2014-01-10 LAB — CBC WITH DIFFERENTIAL (CANCER CENTER ONLY)
BASO#: 0.1 10*3/uL (ref 0.0–0.2)
BASO%: 0.4 % (ref 0.0–2.0)
EOS%: 3.7 % (ref 0.0–7.0)
Eosinophils Absolute: 0.5 10*3/uL (ref 0.0–0.5)
HCT: 29.8 % — ABNORMAL LOW (ref 34.8–46.6)
HGB: 9.4 g/dL — ABNORMAL LOW (ref 11.6–15.9)
LYMPH#: 1.4 10*3/uL (ref 0.9–3.3)
LYMPH%: 11.5 % — ABNORMAL LOW (ref 14.0–48.0)
MCH: 27.5 pg (ref 26.0–34.0)
MCHC: 31.5 g/dL — ABNORMAL LOW (ref 32.0–36.0)
MCV: 87 fL (ref 81–101)
MONO#: 2.6 10*3/uL — ABNORMAL HIGH (ref 0.1–0.9)
MONO%: 21.3 % — AB (ref 0.0–13.0)
NEUT#: 7.6 10*3/uL — ABNORMAL HIGH (ref 1.5–6.5)
NEUT%: 63.1 % (ref 39.6–80.0)
PLATELETS: 965 10*3/uL — AB (ref 145–400)
RBC: 3.42 10*6/uL — ABNORMAL LOW (ref 3.70–5.32)
RDW: 23 % — AB (ref 11.1–15.7)
WBC: 12.1 10*3/uL — ABNORMAL HIGH (ref 3.9–10.0)

## 2014-01-10 LAB — CMP (CANCER CENTER ONLY)
ALK PHOS: 125 U/L — AB (ref 26–84)
ALT(SGPT): 11 U/L (ref 10–47)
AST: 29 U/L (ref 11–38)
Albumin: 2.1 g/dL — ABNORMAL LOW (ref 3.3–5.5)
BILIRUBIN TOTAL: 0.4 mg/dL (ref 0.20–1.60)
BUN: 21 mg/dL (ref 7–22)
CO2: 31 mEq/L (ref 18–33)
Calcium: 8.6 mg/dL (ref 8.0–10.3)
Chloride: 98 mEq/L (ref 98–108)
Creat: 1.1 mg/dl (ref 0.6–1.2)
GLUCOSE: 190 mg/dL — AB (ref 73–118)
Potassium: 3.3 mEq/L (ref 3.3–4.7)
Sodium: 139 mEq/L (ref 128–145)
Total Protein: 6.5 g/dL (ref 6.4–8.1)

## 2014-01-10 LAB — TECHNOLOGIST REVIEW CHCC SATELLITE

## 2014-01-10 MED ORDER — SODIUM CHLORIDE 0.9 % IV SOLN
Freq: Once | INTRAVENOUS | Status: AC
  Administered 2014-01-10: 13:00:00 via INTRAVENOUS

## 2014-01-10 MED ORDER — SODIUM CHLORIDE 0.9 % IV SOLN
800.0000 mg/m2 | Freq: Once | INTRAVENOUS | Status: AC
  Administered 2014-01-10: 1520 mg via INTRAVENOUS
  Filled 2014-01-10: qty 39.98

## 2014-01-10 MED ORDER — FENTANYL 25 MCG/HR TD PT72: 25.0000 ug | MEDICATED_PATCH | TRANSDERMAL | Status: DC

## 2014-01-10 MED ORDER — SODIUM CHLORIDE 0.9 % IJ SOLN
10.0000 mL | INTRAMUSCULAR | Status: DC | PRN
  Administered 2014-01-10: 10 mL
  Filled 2014-01-10: qty 10

## 2014-01-10 MED ORDER — HEPARIN SOD (PORK) LOCK FLUSH 100 UNIT/ML IV SOLN
500.0000 [IU] | Freq: Once | INTRAVENOUS | Status: AC | PRN
Start: 2014-01-10 — End: 2014-01-10
  Administered 2014-01-10: 500 [IU]
  Filled 2014-01-10: qty 5

## 2014-01-10 MED ORDER — PROCHLORPERAZINE MALEATE 10 MG PO TABS
ORAL_TABLET | ORAL | Status: AC
  Filled 2014-01-10: qty 1

## 2014-01-10 MED ORDER — PROCHLORPERAZINE MALEATE 10 MG PO TABS
10.0000 mg | ORAL_TABLET | Freq: Once | ORAL | Status: AC
  Administered 2014-01-10: 10 mg via ORAL

## 2014-01-10 NOTE — Patient Instructions (Signed)

## 2014-01-11 LAB — PREALBUMIN: Prealbumin: 8.9 mg/dL — ABNORMAL LOW (ref 17.0–34.0)

## 2014-01-11 LAB — CANCER ANTIGEN 19-9: CA 19 9: 3.7 U/mL (ref <35.0)

## 2014-01-11 NOTE — Progress Notes (Signed)
Hematology and Oncology Follow Up Visit  Carolyn [name] [id number] January 21, 1936 77 y.o. 01/11/2014   Principle Diagnosis:   Locally advanced pancreatic adenocarcinoma  Current Therapy:   Status post 3 weeks of gemcitabine     Interim History:  Ms.  [name] is back for followup. She actually looks pretty good. She seemed to be eating okay. Her weight is up a little bit.  She's had no problem pain. She's having no nausea vomiting. She's not having any issues with bleeding. There is no constipation or diarrhea. There's been no cough. There's been no mouth sores. She's had no leg swelling. Overall, her performance status is ECoG 1-2  Medications: Current outpatient prescriptions:acetaminophen (TYLENOL) 325 MG tablet, Take 325 mg by mouth 3 (three) times daily. , Disp: , Rfl: ;  Cholecalciferol (VITAMIN D) 400 UNITS capsule, Take 400 Units by mouth daily. , Disp: , Rfl: ;  citalopram (CELEXA) 20 MG tablet, Take 1 tablet (20 mg total) by mouth every evening., Disp: 90 tablet, Rfl: 3 docusate sodium (COLACE) 100 MG capsule, Take 1 capsule (100 mg total) by mouth daily., Disp: 90 capsule, Rfl: 3;  feeding supplement, RESOURCE BREEZE, (RESOURCE BREEZE) LIQD, Take 1 Container by mouth 2 (two) times daily between meals., Disp: , Rfl: ;  furosemide (LASIX) 20 MG tablet, Take 20 mg by mouth every morning., Disp: , Rfl: ;  lactose free nutrition (BOOST) LIQD, Take 237 mLs by mouth daily., Disp: , Rfl:  lidocaine-prilocaine (EMLA) cream, Apply 1 application topically as needed. APPLY TO PORT SITE 1HR PRIOR TO CHEMO APPOINTMENT., Disp: , Rfl: ;  lipase/protease/amylase (CREON-12/PANCREASE) 12000 UNITS CPEP capsule, Take 1 capsule by mouth daily., Disp: 180 capsule, Rfl: 3;  LORazepam (ATIVAN) 0.5 MG tablet, Take 1 tablet (0.5 mg total) by mouth every 6 (six) hours as needed (Nausea or vomiting)., Disp: 30 tablet, Rfl: 0 naproxen sodium (ANAPROX) 220 MG tablet, Take 1 tablet (220 mg total) by mouth 2 (two) times daily  as needed (pain)., Disp: 180 tablet, Rfl: 3;  omeprazole (PRILOSEC) 20 MG capsule, Take 20 mg by mouth daily., Disp: , Rfl: ;  potassium chloride SA (K-DUR,KLOR-CON) 20 MEQ tablet, Take 1 tablet (20 mEq total) by mouth 2 (two) times daily., Disp: 180 tablet, Rfl: 3;  verapamil (CALAN) 120 MG tablet, Take 120 mg by mouth every morning., Disp: , Rfl:  fentaNYL (DURAGESIC - DOSED MCG/HR) 25 MCG/HR patch, Place 1 patch (25 mcg total) onto the skin every 3 (three) days. HOSPICE PATIENT, Disp: 10 patch, Rfl: 0;  ondansetron (ZOFRAN) 8 MG tablet, Take 1 tablet (8 mg total) by mouth 2 (two) times daily as needed (Nausea or vomiting)., Disp: 30 tablet, Rfl: 1 prochlorperazine (COMPAZINE) 10 MG tablet, Take 1 tablet (10 mg total) by mouth every 6 (six) hours as needed (Nausea or vomiting)., Disp: 30 tablet, Rfl: 1 No current facility-administered medications for this visit. Facility-Administered Medications Ordered in Other Visits: sodium chloride 0.9 % injection 10 mL, 10 mL, Intracatheter, PRN, Volanda Napoleon, MD, 10 mL at 11/22/13 1438  Allergies:  Allergies  Allergen Reactions  . Alka-Seltzer [Aspirin Effervescent] Swelling    Past Medical History, Surgical history, Social history, and Family History were reviewed and updated.  Review of Systems: As above  Physical Exam:  height is 5\' 4"  (1.626 m) and weight is 190 lb (86.183 kg). Her oral temperature is 98.8 F (37.1 C). Her blood pressure is 163/74 and her pulse is 86. Her respiration is 20.   Elderly appearing African  American female. Lungs are clear. Oral exam shows no mucositis. There is no scleral icterus. Neck is supple with no lymphadenopathy.Cardiac exam regular rate and rhythm. Abdomen is soft. She good bowel sounds. There is no fluid wave. There is no abdominal mass. There is no palpable liver or spleen tip. Her exam no tenderness or the spine ribs or hips. Extremities shows no clubbing cyanosis or edema. Skin exam no rashes. Lab Results   Component Value Date   WBC 12.1* 01/10/2014   HGB 9.4* 01/10/2014   HCT 29.8* 01/10/2014   MCV 87 01/10/2014   PLT 965* 01/10/2014     Chemistry      Component Value Date/Time   NA 139 01/10/2014 1146   NA 131* 09/08/2013 0547   K 3.3 01/10/2014 1146   K 3.5 09/08/2013 0547   CL 98 01/10/2014 1146   CL 98 09/08/2013 0547   CO2 31 01/10/2014 1146   CO2 20 09/08/2013 0547   BUN 21 01/10/2014 1146   BUN 32* 09/08/2013 0547   CREATININE 1.1 01/10/2014 1146   CREATININE 1.23* 09/08/2013 0547      Component Value Date/Time   CALCIUM 8.6 01/10/2014 1146   CALCIUM 10.5 09/08/2013 0547   ALKPHOS 125* 01/10/2014 1146   ALKPHOS 134* 09/08/2013 0547   AST 29 01/10/2014 1146   AST 59* 09/08/2013 0547   ALT 11 01/10/2014 1146   ALT 65* 09/08/2013 0547   BILITOT 0.40 01/10/2014 1146   BILITOT 0.5 09/08/2013 0547         Impression and Plan: Carolyn [name] is 78 year old female with locally advanced pancreatic cancer. We have her on single agent gemcitabine. This is about all that she is going to be able to tolerate. She do well with it so far.  We will start next three-week cycle. I will then plan for a followup CT scan to see how well things look.  Commonest thankful that she is doing as well as she is doing. She's not hurting. She's not getting sick. She's not come back to the hospital.  We will recheck her lab work weekly.  We will get a followup CT scan in about month. I will see her afterwards.   Volanda Napoleon, MD 4/9/20156:53 AM

## 2014-01-16 ENCOUNTER — Other Ambulatory Visit: Payer: Self-pay | Admitting: Hematology & Oncology

## 2014-01-16 ENCOUNTER — Other Ambulatory Visit: Payer: Self-pay | Admitting: Nurse Practitioner

## 2014-01-16 DIAGNOSIS — C259 Malignant neoplasm of pancreas, unspecified: Secondary | ICD-10-CM

## 2014-01-16 DIAGNOSIS — C250 Malignant neoplasm of head of pancreas: Secondary | ICD-10-CM

## 2014-01-17 ENCOUNTER — Ambulatory Visit (HOSPITAL_BASED_OUTPATIENT_CLINIC_OR_DEPARTMENT_OTHER): Payer: Medicare Other

## 2014-01-17 ENCOUNTER — Other Ambulatory Visit (HOSPITAL_BASED_OUTPATIENT_CLINIC_OR_DEPARTMENT_OTHER): Payer: Medicare Other | Admitting: Lab

## 2014-01-17 VITALS — BP 140/61 | HR 74 | Temp 96.7°F | Resp 16

## 2014-01-17 DIAGNOSIS — Z5111 Encounter for antineoplastic chemotherapy: Secondary | ICD-10-CM

## 2014-01-17 DIAGNOSIS — C259 Malignant neoplasm of pancreas, unspecified: Secondary | ICD-10-CM

## 2014-01-17 DIAGNOSIS — K8689 Other specified diseases of pancreas: Secondary | ICD-10-CM

## 2014-01-17 DIAGNOSIS — C250 Malignant neoplasm of head of pancreas: Secondary | ICD-10-CM

## 2014-01-17 LAB — CBC WITH DIFFERENTIAL (CANCER CENTER ONLY)
BASO#: 0.1 10*3/uL (ref 0.0–0.2)
BASO%: 1.7 % (ref 0.0–2.0)
EOS ABS: 0.1 10*3/uL (ref 0.0–0.5)
EOS%: 1.7 % (ref 0.0–7.0)
HCT: 28.9 % — ABNORMAL LOW (ref 34.8–46.6)
HEMOGLOBIN: 9.5 g/dL — AB (ref 11.6–15.9)
LYMPH#: 1.5 10*3/uL (ref 0.9–3.3)
LYMPH%: 35.7 % (ref 14.0–48.0)
MCH: 27.7 pg (ref 26.0–34.0)
MCHC: 32.9 g/dL (ref 32.0–36.0)
MCV: 84 fL (ref 81–101)
MONO#: 0.3 10*3/uL (ref 0.1–0.9)
MONO%: 7.2 % (ref 0.0–13.0)
NEUT%: 53.7 % (ref 39.6–80.0)
NEUTROS ABS: 2.2 10*3/uL (ref 1.5–6.5)
Platelets: 426 10*3/uL — ABNORMAL HIGH (ref 145–400)
RBC: 3.43 10*6/uL — AB (ref 3.70–5.32)
RDW: 20.9 % — ABNORMAL HIGH (ref 11.1–15.7)
WBC: 3.7 10*3/uL — ABNORMAL LOW (ref 3.9–10.0)

## 2014-01-17 LAB — TECHNOLOGIST REVIEW CHCC SATELLITE: Tech Review: 13

## 2014-01-17 LAB — CMP (CANCER CENTER ONLY)
ALT: 23 U/L (ref 10–47)
AST: 82 U/L — ABNORMAL HIGH (ref 11–38)
Albumin: 2.2 g/dL — ABNORMAL LOW (ref 3.3–5.5)
Alkaline Phosphatase: 130 U/L — ABNORMAL HIGH (ref 26–84)
BILIRUBIN TOTAL: 0.4 mg/dL (ref 0.20–1.60)
BUN, Bld: 12 mg/dL (ref 7–22)
CHLORIDE: 96 meq/L — AB (ref 98–108)
CO2: 31 mEq/L (ref 18–33)
Calcium: 8.8 mg/dL (ref 8.0–10.3)
Creat: 1 mg/dl (ref 0.6–1.2)
Glucose, Bld: 140 mg/dL — ABNORMAL HIGH (ref 73–118)
Potassium: 2.9 mEq/L — CL (ref 3.3–4.7)
SODIUM: 137 meq/L (ref 128–145)
TOTAL PROTEIN: 6.9 g/dL (ref 6.4–8.1)

## 2014-01-17 LAB — PREALBUMIN: Prealbumin: 7.9 mg/dL — ABNORMAL LOW (ref 17.0–34.0)

## 2014-01-17 LAB — CANCER ANTIGEN 19-9: CA 19 9: 2.4 U/mL (ref <35.0)

## 2014-01-17 MED ORDER — PROCHLORPERAZINE MALEATE 10 MG PO TABS
ORAL_TABLET | ORAL | Status: AC
  Filled 2014-01-17: qty 1

## 2014-01-17 MED ORDER — PROCHLORPERAZINE MALEATE 10 MG PO TABS
10.0000 mg | ORAL_TABLET | Freq: Once | ORAL | Status: AC
  Administered 2014-01-17: 10 mg via ORAL

## 2014-01-17 MED ORDER — SODIUM CHLORIDE 0.9 % IV SOLN
800.0000 mg/m2 | Freq: Once | INTRAVENOUS | Status: AC
  Administered 2014-01-17: 1520 mg via INTRAVENOUS
  Filled 2014-01-17: qty 40

## 2014-01-17 MED ORDER — HEPARIN SOD (PORK) LOCK FLUSH 100 UNIT/ML IV SOLN
500.0000 [IU] | Freq: Once | INTRAVENOUS | Status: AC | PRN
  Administered 2014-01-17: 500 [IU]
  Filled 2014-01-17: qty 5

## 2014-01-17 MED ORDER — SODIUM CHLORIDE 0.9 % IJ SOLN
10.0000 mL | INTRAMUSCULAR | Status: DC | PRN
  Administered 2014-01-17: 10 mL
  Filled 2014-01-17: qty 10

## 2014-01-17 MED ORDER — SODIUM CHLORIDE 0.9 % IV SOLN
Freq: Once | INTRAVENOUS | Status: AC
  Administered 2014-01-17: 13:00:00 via INTRAVENOUS

## 2014-01-17 NOTE — Patient Instructions (Signed)

## 2014-01-24 ENCOUNTER — Ambulatory Visit: Payer: Medicare Other

## 2014-01-24 ENCOUNTER — Other Ambulatory Visit: Payer: Medicare Other | Admitting: Lab

## 2014-01-24 DIAGNOSIS — C250 Malignant neoplasm of head of pancreas: Secondary | ICD-10-CM

## 2014-01-24 LAB — CMP (CANCER CENTER ONLY)
ALT(SGPT): 28 U/L (ref 10–47)
AST: 68 U/L — ABNORMAL HIGH (ref 11–38)
Albumin: 2.2 g/dL — ABNORMAL LOW (ref 3.3–5.5)
Alkaline Phosphatase: 175 U/L — ABNORMAL HIGH (ref 26–84)
BUN, Bld: 34 mg/dL — ABNORMAL HIGH (ref 7–22)
CO2: 22 mEq/L (ref 18–33)
Calcium: 9.4 mg/dL (ref 8.0–10.3)
Chloride: 97 mEq/L — ABNORMAL LOW (ref 98–108)
Creat: 1.9 mg/dl — ABNORMAL HIGH (ref 0.6–1.2)
Glucose, Bld: 149 mg/dL — ABNORMAL HIGH (ref 73–118)
Potassium: 4.9 mEq/L — ABNORMAL HIGH (ref 3.3–4.7)
Sodium: 137 mEq/L (ref 128–145)
TOTAL PROTEIN: 6.7 g/dL (ref 6.4–8.1)
Total Bilirubin: 0.6 mg/dl (ref 0.20–1.60)

## 2014-01-24 LAB — CBC WITH DIFFERENTIAL (CANCER CENTER ONLY)
BASO#: 0 10*3/uL (ref 0.0–0.2)
BASO%: 0.2 % (ref 0.0–2.0)
EOS%: 0.4 % (ref 0.0–7.0)
Eosinophils Absolute: 0 10*3/uL (ref 0.0–0.5)
HCT: 25.7 % — ABNORMAL LOW (ref 34.8–46.6)
HGB: 8.6 g/dL — ABNORMAL LOW (ref 11.6–15.9)
LYMPH#: 0.4 10*3/uL — AB (ref 0.9–3.3)
LYMPH%: 8.1 % — ABNORMAL LOW (ref 14.0–48.0)
MCH: 28.8 pg (ref 26.0–34.0)
MCHC: 33.5 g/dL (ref 32.0–36.0)
MCV: 86 fL (ref 81–101)
MONO#: 0.1 10*3/uL (ref 0.1–0.9)
MONO%: 2.4 % (ref 0.0–13.0)
NEUT#: 4.8 10*3/uL (ref 1.5–6.5)
NEUT%: 88.9 % — ABNORMAL HIGH (ref 39.6–80.0)
Platelets: 25 10*3/uL — ABNORMAL LOW (ref 145–400)
RBC: 2.99 10*6/uL — ABNORMAL LOW (ref 3.70–5.32)
RDW: 20.7 % — AB (ref 11.1–15.7)
WBC: 5.4 10*3/uL (ref 3.9–10.0)

## 2014-01-24 LAB — TECHNOLOGIST REVIEW CHCC SATELLITE

## 2014-01-24 NOTE — Progress Notes (Addendum)
Per Dr. Marin Olp, no treatment today based on pt's lab work done today in our office.  Per Dr. Marin Olp , pt will get CT already scheduled for next week and then see Dr. Marin Olp as scheduled .

## 2014-01-25 ENCOUNTER — Telehealth: Payer: Self-pay | Admitting: *Deleted

## 2014-01-25 NOTE — Telephone Encounter (Addendum)
Message copied by Orlando Penner on Thu Jan 25, 2014 10:09 AM ------      Message from: Burney Gauze R      Created: Wed Jan 24, 2014  9:29 PM       Call - how much K+ is she taking?? Pete ------Pt's husband states he was giving his wife 19meq of potasium bid.  Per Dr. Marin Olp, pt needs to take 44mEq bid.  Husband voiced understanding.

## 2014-01-27 ENCOUNTER — Emergency Department (HOSPITAL_COMMUNITY): Payer: Medicare Other

## 2014-01-27 ENCOUNTER — Other Ambulatory Visit: Payer: Self-pay

## 2014-01-27 ENCOUNTER — Inpatient Hospital Stay (HOSPITAL_COMMUNITY)
Admission: EM | Admit: 2014-01-27 | Discharge: 2014-02-05 | DRG: 435 | Disposition: A | Discharge status: Hospice | Payer: Medicare Other | Attending: Family Medicine | Admitting: Family Medicine

## 2014-01-27 ENCOUNTER — Encounter (HOSPITAL_COMMUNITY): Payer: Self-pay | Admitting: Emergency Medicine

## 2014-01-27 DIAGNOSIS — K769 Liver disease, unspecified: Secondary | ICD-10-CM | POA: Diagnosis present

## 2014-01-27 DIAGNOSIS — Z886 Allergy status to analgesic agent status: Secondary | ICD-10-CM

## 2014-01-27 DIAGNOSIS — R410 Disorientation, unspecified: Secondary | ICD-10-CM | POA: Diagnosis present

## 2014-01-27 DIAGNOSIS — K831 Obstruction of bile duct: Secondary | ICD-10-CM

## 2014-01-27 DIAGNOSIS — Z9849 Cataract extraction status, unspecified eye: Secondary | ICD-10-CM

## 2014-01-27 DIAGNOSIS — A419 Sepsis, unspecified organism: Secondary | ICD-10-CM | POA: Diagnosis present

## 2014-01-27 DIAGNOSIS — E876 Hypokalemia: Secondary | ICD-10-CM | POA: Diagnosis present

## 2014-01-27 DIAGNOSIS — D72829 Elevated white blood cell count, unspecified: Secondary | ICD-10-CM

## 2014-01-27 DIAGNOSIS — D696 Thrombocytopenia, unspecified: Secondary | ICD-10-CM | POA: Diagnosis present

## 2014-01-27 DIAGNOSIS — D6481 Anemia due to antineoplastic chemotherapy: Secondary | ICD-10-CM | POA: Diagnosis present

## 2014-01-27 DIAGNOSIS — I129 Hypertensive chronic kidney disease with stage 1 through stage 4 chronic kidney disease, or unspecified chronic kidney disease: Secondary | ICD-10-CM | POA: Diagnosis present

## 2014-01-27 DIAGNOSIS — Z66 Do not resuscitate: Secondary | ICD-10-CM | POA: Diagnosis present

## 2014-01-27 DIAGNOSIS — F329 Major depressive disorder, single episode, unspecified: Secondary | ICD-10-CM | POA: Diagnosis present

## 2014-01-27 DIAGNOSIS — K5900 Constipation, unspecified: Secondary | ICD-10-CM

## 2014-01-27 DIAGNOSIS — Z9089 Acquired absence of other organs: Secondary | ICD-10-CM

## 2014-01-27 DIAGNOSIS — J45909 Unspecified asthma, uncomplicated: Secondary | ICD-10-CM | POA: Diagnosis present

## 2014-01-27 DIAGNOSIS — N179 Acute kidney failure, unspecified: Secondary | ICD-10-CM | POA: Diagnosis present

## 2014-01-27 DIAGNOSIS — R531 Weakness: Secondary | ICD-10-CM

## 2014-01-27 DIAGNOSIS — T40605A Adverse effect of unspecified narcotics, initial encounter: Secondary | ICD-10-CM | POA: Diagnosis not present

## 2014-01-27 DIAGNOSIS — E785 Hyperlipidemia, unspecified: Secondary | ICD-10-CM | POA: Diagnosis present

## 2014-01-27 DIAGNOSIS — K219 Gastro-esophageal reflux disease without esophagitis: Secondary | ICD-10-CM

## 2014-01-27 DIAGNOSIS — K859 Acute pancreatitis without necrosis or infection, unspecified: Secondary | ICD-10-CM

## 2014-01-27 DIAGNOSIS — R55 Syncope and collapse: Secondary | ICD-10-CM

## 2014-01-27 DIAGNOSIS — Z79899 Other long term (current) drug therapy: Secondary | ICD-10-CM

## 2014-01-27 DIAGNOSIS — IMO0002 Reserved for concepts with insufficient information to code with codable children: Secondary | ICD-10-CM | POA: Diagnosis present

## 2014-01-27 DIAGNOSIS — F29 Unspecified psychosis not due to a substance or known physiological condition: Secondary | ICD-10-CM | POA: Diagnosis present

## 2014-01-27 DIAGNOSIS — E7220 Disorder of urea cycle metabolism, unspecified: Secondary | ICD-10-CM | POA: Diagnosis present

## 2014-01-27 DIAGNOSIS — N183 Chronic kidney disease, stage 3 unspecified: Secondary | ICD-10-CM | POA: Diagnosis present

## 2014-01-27 DIAGNOSIS — Z515 Encounter for palliative care: Secondary | ICD-10-CM

## 2014-01-27 DIAGNOSIS — E860 Dehydration: Secondary | ICD-10-CM | POA: Diagnosis present

## 2014-01-27 DIAGNOSIS — C250 Malignant neoplasm of head of pancreas: Principal | ICD-10-CM | POA: Diagnosis present

## 2014-01-27 DIAGNOSIS — D649 Anemia, unspecified: Secondary | ICD-10-CM | POA: Diagnosis present

## 2014-01-27 DIAGNOSIS — D62 Acute posthemorrhagic anemia: Secondary | ICD-10-CM

## 2014-01-27 DIAGNOSIS — G9341 Metabolic encephalopathy: Secondary | ICD-10-CM | POA: Diagnosis present

## 2014-01-27 DIAGNOSIS — F3289 Other specified depressive episodes: Secondary | ICD-10-CM | POA: Diagnosis present

## 2014-01-27 DIAGNOSIS — K8689 Other specified diseases of pancreas: Secondary | ICD-10-CM

## 2014-01-27 DIAGNOSIS — E43 Unspecified severe protein-calorie malnutrition: Secondary | ICD-10-CM | POA: Diagnosis present

## 2014-01-27 DIAGNOSIS — I519 Heart disease, unspecified: Secondary | ICD-10-CM | POA: Diagnosis present

## 2014-01-27 DIAGNOSIS — Z87891 Personal history of nicotine dependence: Secondary | ICD-10-CM

## 2014-01-27 DIAGNOSIS — D6959 Other secondary thrombocytopenia: Secondary | ICD-10-CM | POA: Diagnosis present

## 2014-01-27 DIAGNOSIS — I10 Essential (primary) hypertension: Secondary | ICD-10-CM

## 2014-01-27 DIAGNOSIS — C259 Malignant neoplasm of pancreas, unspecified: Secondary | ICD-10-CM

## 2014-01-27 DIAGNOSIS — F22 Delusional disorders: Secondary | ICD-10-CM | POA: Diagnosis present

## 2014-01-27 DIAGNOSIS — L299 Pruritus, unspecified: Secondary | ICD-10-CM

## 2014-01-27 DIAGNOSIS — F419 Anxiety disorder, unspecified: Secondary | ICD-10-CM

## 2014-01-27 DIAGNOSIS — E042 Nontoxic multinodular goiter: Secondary | ICD-10-CM

## 2014-01-27 DIAGNOSIS — T451X5A Adverse effect of antineoplastic and immunosuppressive drugs, initial encounter: Secondary | ICD-10-CM | POA: Diagnosis present

## 2014-01-27 DIAGNOSIS — R011 Cardiac murmur, unspecified: Secondary | ICD-10-CM | POA: Diagnosis present

## 2014-01-27 DIAGNOSIS — G4710 Hypersomnia, unspecified: Secondary | ICD-10-CM | POA: Diagnosis not present

## 2014-01-27 DIAGNOSIS — F411 Generalized anxiety disorder: Secondary | ICD-10-CM

## 2014-01-27 DIAGNOSIS — R630 Anorexia: Secondary | ICD-10-CM | POA: Diagnosis present

## 2014-01-27 DIAGNOSIS — M129 Arthropathy, unspecified: Secondary | ICD-10-CM | POA: Diagnosis present

## 2014-01-27 DIAGNOSIS — R627 Adult failure to thrive: Secondary | ICD-10-CM | POA: Diagnosis present

## 2014-01-27 DIAGNOSIS — R109 Unspecified abdominal pain: Secondary | ICD-10-CM | POA: Diagnosis present

## 2014-01-27 LAB — CBC
HCT: 19.5 % — ABNORMAL LOW (ref 36.0–46.0)
HEMOGLOBIN: 6.6 g/dL — AB (ref 12.0–15.0)
MCH: 27.6 pg (ref 26.0–34.0)
MCHC: 33.8 g/dL (ref 30.0–36.0)
MCV: 81.6 fL (ref 78.0–100.0)
Platelets: 73 10*3/uL — ABNORMAL LOW (ref 150–400)
RBC: 2.39 MIL/uL — ABNORMAL LOW (ref 3.87–5.11)
RDW: 20.3 % — AB (ref 11.5–15.5)
WBC: 9 10*3/uL (ref 4.0–10.5)

## 2014-01-27 LAB — URINALYSIS, ROUTINE W REFLEX MICROSCOPIC
Bilirubin Urine: NEGATIVE
Glucose, UA: NEGATIVE mg/dL
Hgb urine dipstick: NEGATIVE
KETONES UR: NEGATIVE mg/dL
Leukocytes, UA: NEGATIVE
NITRITE: NEGATIVE
PROTEIN: NEGATIVE mg/dL
Specific Gravity, Urine: 1.017 (ref 1.005–1.030)
Urobilinogen, UA: 1 mg/dL (ref 0.0–1.0)
pH: 5 (ref 5.0–8.0)

## 2014-01-27 LAB — COMPREHENSIVE METABOLIC PANEL
ALBUMIN: 2 g/dL — AB (ref 3.5–5.2)
ALK PHOS: 199 U/L — AB (ref 39–117)
ALT: 50 U/L — ABNORMAL HIGH (ref 0–35)
AST: 109 U/L — ABNORMAL HIGH (ref 0–37)
BILIRUBIN TOTAL: 0.6 mg/dL (ref 0.3–1.2)
BUN: 36 mg/dL — AB (ref 6–23)
CO2: 23 meq/L (ref 19–32)
CREATININE: 1.54 mg/dL — AB (ref 0.50–1.10)
Calcium: 9.7 mg/dL (ref 8.4–10.5)
Chloride: 98 mEq/L (ref 96–112)
GFR, EST AFRICAN AMERICAN: 37 mL/min — AB (ref >90)
GFR, EST NON AFRICAN AMERICAN: 32 mL/min — AB (ref >90)
Glucose, Bld: 88 mg/dL (ref 70–99)
Potassium: 5.2 mEq/L (ref 3.7–5.3)
Sodium: 134 mEq/L — ABNORMAL LOW (ref 137–147)
Total Protein: 6 g/dL (ref 6.0–8.3)

## 2014-01-27 LAB — PRO B NATRIURETIC PEPTIDE: PRO B NATRI PEPTIDE: 1669 pg/mL — AB (ref 0–450)

## 2014-01-27 LAB — TROPONIN I

## 2014-01-27 LAB — CBG MONITORING, ED: Glucose-Capillary: 90 mg/dL (ref 70–99)

## 2014-01-27 LAB — PREPARE RBC (CROSSMATCH)

## 2014-01-27 MED ORDER — BOOST / RESOURCE BREEZE PO LIQD
1.0000 | Freq: Three times a day (TID) | ORAL | Status: DC
  Administered 2014-01-27 – 2014-02-01 (×6): 1 via ORAL

## 2014-01-27 MED ORDER — POTASSIUM CHLORIDE 10 MEQ/100ML IV SOLN
10.0000 meq | INTRAVENOUS | Status: AC
  Administered 2014-01-27 – 2014-01-28 (×2): 10 meq via INTRAVENOUS
  Filled 2014-01-27 (×2): qty 100

## 2014-01-27 MED ORDER — MORPHINE SULFATE 15 MG PO TABS: 15.0000 mg | ORAL_TABLET | ORAL | Status: DC | PRN

## 2014-01-27 MED ORDER — POTASSIUM CHLORIDE CRYS ER 20 MEQ PO TBCR
20.0000 meq | EXTENDED_RELEASE_TABLET | Freq: Two times a day (BID) | ORAL | Status: DC
  Administered 2014-01-28 – 2014-01-29 (×2): 20 meq via ORAL
  Filled 2014-01-27 (×5): qty 1

## 2014-01-27 MED ORDER — FENTANYL 25 MCG/HR TD PT72
25.0000 ug | MEDICATED_PATCH | TRANSDERMAL | Status: DC
  Administered 2014-01-28: 25 ug via TRANSDERMAL
  Filled 2014-01-27: qty 1

## 2014-01-27 MED ORDER — PROCHLORPERAZINE MALEATE 10 MG PO TABS: 10.0000 mg | ORAL_TABLET | Freq: Four times a day (QID) | ORAL | Status: DC | PRN

## 2014-01-27 MED ORDER — ACETAMINOPHEN 500 MG PO TABS: 500.0000 mg | ORAL_TABLET | Freq: Four times a day (QID) | ORAL | Status: DC | PRN

## 2014-01-27 MED ORDER — PANCRELIPASE (LIP-PROT-AMYL) 12000-38000 UNITS PO CPEP
1.0000 | ORAL_CAPSULE | Freq: Every day | ORAL | Status: DC
  Administered 2014-01-28 – 2014-01-29 (×2): 1 via ORAL
  Filled 2014-01-27 (×2): qty 1

## 2014-01-27 MED ORDER — SODIUM CHLORIDE 0.9 % IV SOLN
INTRAVENOUS | Status: DC
  Administered 2014-01-27 – 2014-01-28 (×2): via INTRAVENOUS

## 2014-01-27 MED ORDER — PANTOPRAZOLE SODIUM 40 MG PO TBEC
40.0000 mg | DELAYED_RELEASE_TABLET | Freq: Every day | ORAL | Status: DC
  Filled 2014-01-27: qty 1

## 2014-01-27 MED ORDER — SODIUM CHLORIDE 0.9 % IV BOLUS (SEPSIS)
700.0000 mL | Freq: Once | INTRAVENOUS | Status: AC
  Administered 2014-01-27: 700 mL via INTRAVENOUS

## 2014-01-27 MED ORDER — SODIUM CHLORIDE 0.9 % IV BOLUS (SEPSIS)
500.0000 mL | Freq: Once | INTRAVENOUS | Status: AC
  Administered 2014-01-27: 500 mL via INTRAVENOUS

## 2014-01-27 MED ORDER — MORPHINE SULFATE 2 MG/ML IJ SOLN
2.0000 mg | INTRAMUSCULAR | Status: DC | PRN
Start: 2014-01-27 — End: 2014-02-05
  Administered 2014-01-28 – 2014-02-05 (×25): 2 mg via INTRAVENOUS
  Filled 2014-01-27 (×26): qty 1

## 2014-01-27 MED ORDER — ONDANSETRON HCL 4 MG/2ML IJ SOLN
4.0000 mg | Freq: Four times a day (QID) | INTRAMUSCULAR | Status: DC | PRN
Start: 2014-01-27 — End: 2014-02-05

## 2014-01-27 MED ORDER — DOCUSATE SODIUM 100 MG PO CAPS
100.0000 mg | ORAL_CAPSULE | Freq: Every day | ORAL | Status: DC
  Administered 2014-01-28: 100 mg via ORAL
  Filled 2014-01-27 (×3): qty 1

## 2014-01-27 MED ORDER — SODIUM CHLORIDE 0.9 % IV SOLN
INTRAVENOUS | Status: DC
  Administered 2014-01-30 – 2014-02-01 (×2): via INTRAVENOUS

## 2014-01-27 MED ORDER — CITALOPRAM HYDROBROMIDE 20 MG PO TABS
20.0000 mg | ORAL_TABLET | Freq: Every evening | ORAL | Status: DC
  Administered 2014-01-27 – 2014-01-28 (×2): 20 mg via ORAL
  Filled 2014-01-27 (×3): qty 1

## 2014-01-27 MED ORDER — ONDANSETRON HCL 4 MG PO TABS: 4.0000 mg | ORAL_TABLET | Freq: Four times a day (QID) | ORAL | Status: DC | PRN

## 2014-01-27 MED ORDER — VERAPAMIL HCL 120 MG PO TABS
120.0000 mg | ORAL_TABLET | Freq: Every morning | ORAL | Status: DC
  Administered 2014-01-28 – 2014-01-29 (×2): 120 mg via ORAL
  Filled 2014-01-27 (×2): qty 1

## 2014-01-27 MED ORDER — PANTOPRAZOLE SODIUM 40 MG IV SOLR
40.0000 mg | Freq: Every day | INTRAVENOUS | Status: DC
  Administered 2014-01-27: 40 mg via INTRAVENOUS
  Filled 2014-01-27 (×2): qty 40

## 2014-01-27 NOTE — ED Notes (Signed)
Per husband-patient has been confused, "talking out of her head." Able to remember place, president, but is unfamiliar with today's date. Pt keeps repeating "it's not April 25th." No facial droop noted. VSS. NAD. Husband at bedside.

## 2014-01-27 NOTE — ED Notes (Signed)
Bed: RESA Expected date:  Expected time:  Means of arrival:  Comments: EMS/A.M.[facility]. 

## 2014-01-27 NOTE — H&P (Addendum)
[facility] History and Physical  Carolyn [name] JIR:[id number] DOB: 1935/10/25 DOA: 01/27/2014  Referring physician: EDP PCP: Precious Reel, MD   Chief Complaint: weakness  HPI: Carolyn [name] is a 78 y.o. female with PMH of locally advanced pancreatic CA, biliary stent, was started on Hospice care in 12/14 following this she had some improvement this resulted in Resumption of chemo and code status changed to Full Code, her last chemo was early April per family. Her family noticed that this last week, shes been getting weaker and weaker, eating less and more tired and lethargic at times. No fevers or chills No N/V/Abd pain/Diarrhea, no cough, congestion or shortness of breath. In ER noted to have drop in Hb to 6.6 from 8s, no hematemesis/melena or hematochezia, AKI on CKD. Her PCP was called to admit her per EDP, they declined and hence [facility] consulted for further management.   Review of Systems:  Constitutional:  No weight loss, night sweats, Fevers, chills, fatigue.  HEENT:  No headaches, Difficulty swallowing,Tooth/dental problems,Sore throat,  No sneezing, itching, ear ache, nasal congestion, post nasal drip,  Cardio-vascular:  No chest pain, Orthopnea, PND, swelling in lower extremities, anasarca, dizziness, palpitations  GI:  No heartburn, indigestion, abdominal pain, nausea, vomiting, diarrhea, change in bowel habits, loss of appetite  Resp:  No shortness of breath with exertion or at rest. No excess mucus, no productive cough, No non-productive cough, No coughing up of blood.No change in color of mucus.No wheezing.No chest wall deformity  Skin:  no rash or lesions.  GU:  no dysuria, change in color of urine, no urgency or frequency. No flank pain.  Musculoskeletal:  No joint pain or swelling. No decreased range of motion. No back pain.  Psych:  No change in mood or affect. No depression or anxiety. No memory loss.   Past Medical History  Diagnosis Date  . Hypertension    . Hyperlipidemia   . GERD (gastroesophageal reflux disease)   . Asthma   . Obstructive jaundice 06/2013  . Pancreatitis   . Arthritis   . Cancer   . Heart murmur   . Thyroid disease   . Pancreatic cancer 07/05/13    pancreatic head=adenocarcinoma   Past Surgical History  Procedure Laterality Date  . Neck surgery  2005/2007  . Hand surgery  2010  . Cataract extraction    . Abdominal hysterectomy    . Eus N/A 07/05/2013    Procedure: ESOPHAGEAL ENDOSCOPIC ULTRASOUND (EUS) RADIAL;  Surgeon: Arta Silence, MD;  Location: [facility];  Service: Endoscopy;  Laterality: N/A;  . Ercp N/A 07/05/2013    Procedure: ENDOSCOPIC RETROGRADE CHOLANGIOPANCREATOGRAPHY (ERCP);  Surgeon: Arta Silence, MD;  Location: [name];  Service: Endoscopy;  Laterality: N/A;  . Buttock cyst removal    . Cholecystectomy     Social History:  reports that she quit smoking about 6 months ago. Her smoking use included Cigarettes. She started smoking about 25 years ago. She has a 24 pack-year smoking history. She has never used smokeless tobacco. She reports that she drinks about .6 ounces of alcohol per week. She reports that she does not use illicit drugs.  Allergies  Allergen Reactions  . Alka-Seltzer [Aspirin Effervescent] Swelling    Family History  Problem Relation Age of Onset  . Cancer Daughter     Breast     Prior to Admission medications   Medication Sig Start Date End Date Taking? Authorizing Provider  acetaminophen (TYLENOL) 325 MG tablet Take 325 mg by mouth 3 (  three) times daily.    Yes Historical Provider, MD  Cholecalciferol (VITAMIN D) 400 UNITS capsule Take 400 Units by mouth daily.    Yes Historical Provider, MD  citalopram (CELEXA) 20 MG tablet Take 1 tablet (20 mg total) by mouth every evening. 11/22/13  Yes Volanda Napoleon, MD  docusate sodium (COLACE) 100 MG capsule Take 1 capsule (100 mg total) by mouth daily. 11/22/13  Yes Volanda Napoleon, MD  feeding supplement, RESOURCE BREEZE,  (RESOURCE BREEZE) LIQD Take 1 Container by mouth 2 (two) times daily between meals. 09/07/13  Yes Precious Reel, MD  fentaNYL (DURAGESIC - DOSED MCG/HR) 25 MCG/HR patch Place 1 patch (25 mcg total) onto the skin every 3 (three) days. HOSPICE PATIENT 01/10/14  Yes Volanda Napoleon, MD  furosemide (LASIX) 20 MG tablet Take 20 mg by mouth every morning.   Yes Historical Provider, MD  lactose free nutrition (BOOST) LIQD Take 237 mLs by mouth daily.   Yes Historical Provider, MD  lidocaine-prilocaine (EMLA) cream Apply 1 application topically as needed. APPLY TO PORT SITE 1HR PRIOR TO CHEMO APPOINTMENT. 11/17/13  Yes Volanda Napoleon, MD  lipase/protease/amylase (CREON-12/PANCREASE) 12000 UNITS CPEP capsule Take 1 capsule by mouth daily. 11/22/13  Yes Volanda Napoleon, MD  LORazepam (ATIVAN) 0.5 MG tablet Take 1 tablet (0.5 mg total) by mouth every 6 (six) hours as needed (Nausea or vomiting). 11/22/13  Yes Volanda Napoleon, MD  morphine (MSIR) 15 MG tablet Take 15 mg by mouth every 4 (four) hours as needed for severe pain.   Yes Historical Provider, MD  naproxen sodium (ANAPROX) 220 MG tablet Take 1 tablet (220 mg total) by mouth 2 (two) times daily as needed (pain). 11/22/13  Yes Volanda Napoleon, MD  omeprazole (PRILOSEC) 20 MG capsule Take 20 mg by mouth daily.   Yes Historical Provider, MD  ondansetron (ZOFRAN) 8 MG tablet Take 1 tablet (8 mg total) by mouth 2 (two) times daily as needed (Nausea or vomiting). 12/26/13  Yes Volanda Napoleon, MD  potassium chloride SA (K-DUR,KLOR-CON) 20 MEQ tablet Take 1 tablet (20 mEq total) by mouth 2 (two) times daily. 12/06/13  Yes Volanda Napoleon, MD  prochlorperazine (COMPAZINE) 10 MG tablet Take 1 tablet (10 mg total) by mouth every 6 (six) hours as needed (Nausea or vomiting). 12/26/13  Yes Volanda Napoleon, MD  verapamil (CALAN) 120 MG tablet Take 120 mg by mouth every morning.   Yes Historical Provider, MD   Physical Exam: Filed Vitals:   01/27/14 1800  BP: 161/52  Pulse:  96  Temp:   Resp:     BP 161/52  Pulse 96  Temp(Src) 98.2 F (36.8 C) (Oral)  Resp 13  SpO2 91%  General:  Appears calm and comfortable, ill appearing, alert to self, answers few questions Eyes: PERRL, normal lids, irises & conjunctiva ENT: grossly normal hearing, lips & tongue, oral mucosa dry Neck: no LAD, masses or thyromegaly Cardiovascular: RRR, no m/r/g. No LE edema. Telemetry: SR, no arrhythmias  Respiratory: CTA bilaterally, no w/r/r. Normal respiratory effort. Abdomen: soft, NT, ND, BS present Skin: no rash or induration seen on limited exam Musculoskeletal: grossly normal tone BUE/BLE Psychiatric: flat affect Neurologic: grossly non-focal.          Labs on Admission:  Basic Metabolic Panel:  Recent Labs Lab 01/24/14 1307 01/27/14 1455  NA 137 134*  K 4.9* 5.2  CL 97* 98  CO2 22 23  GLUCOSE 149* 88  BUN  34* 36*  CREATININE 1.9* 1.54*  CALCIUM 9.4 9.7   Liver Function Tests:  Recent Labs Lab 01/24/14 1307 01/27/14 1455  AST 68* 109*  ALT 28 50*  ALKPHOS 175* 199*  BILITOT 0.60 0.6  PROT 6.7 6.0  ALBUMIN  --  2.0*   No results found for this basename: LIPASE, AMYLASE,  in the last 168 hours No results found for this basename: AMMONIA,  in the last 168 hours CBC:  Recent Labs Lab 01/24/14 1307 01/27/14 1455  WBC 5.4 9.0  NEUTROABS 4.8  --   HGB 8.6* 6.6*  HCT 25.7* 19.5*  MCV 86 81.6  PLT 25* 73*   Cardiac Enzymes:  Recent Labs Lab 01/27/14 1455  TROPONINI <0.30    BNP (last 3 results)  Recent Labs  01/27/14 1527  PROBNP 1669.0*   CBG:  Recent Labs Lab 01/27/14 1440  GLUCAP 90    Radiological Exams on Admission: Ct Head Wo Contrast  01/27/2014   CLINICAL DATA:  Altered mental status, on chemotherapy for pancreatic cancer  EXAM: CT HEAD WITHOUT CONTRAST  TECHNIQUE: Contiguous axial images were obtained from the base of the skull through the vertex without intravenous contrast.  COMPARISON:  09/04/2013  FINDINGS: No  evidence of parenchymal hemorrhage or extra-axial fluid collection. No mass lesion, mass effect, or midline shift.  No CT evidence of acute infarction.  Cerebral volume is within normal limits.  No ventriculomegaly.  The visualized paranasal sinuses are essentially clear. The mastoid air cells are unopacified.  No evidence of calvarial fracture.  IMPRESSION: No evidence of acute intracranial abnormality.   Electronically Signed   By: Julian Hy M.D.   On: 01/27/2014 16:17   Dg Chest Portable 1 View  01/27/2014   CLINICAL DATA:  Altered mental status. Shortness of breath and cough.  EXAM: PORTABLE CHEST - 1 VIEW  COMPARISON:  CT chest, abdomen and pelvis 10/03/2013. Single view of the chest 07/27/2013.  FINDINGS: Port-A-Cath is in place. Heart size is upper normal. Lungs are clear. No pneumothorax or pleural effusion.  IMPRESSION: No acute disease.   Electronically Signed   By: Inge Rise M.D.   On: 01/27/2014 15:59    Assessment/Plan    Dehydration -will hydrate, check orthostatics, monitor BUN and clinically  AKI on CKD-stage 3 -CKD likely from hypertensive nephropathy -hold diuretics, hydrate -monitor    Anemia -likely due to chronic disease and chemo -transfuse 1 unit PRBC -check hemoccult and anemia panel    Failure to thrive -due to advanced pancreatic CA    Locally advanced pancreatic cancer -recent chemo on hold due to thrombocytopenia -notify Dr.Ennever via EPIC -last December was under hospice care but had brief improvement which resulted in resumption of Chemo and Full Code now -continue chronic narcotics  Thrombocytopenia -due to chemo -monitor  DVT proph: SCDs   Code Status: Full Code per pt and spouse Family Communication: d/w husband and sister at bedside Disposition Plan: inpatient  Time spent: 85min  Carolyn [name] [facility] Pager [phone]

## 2014-01-27 NOTE — ED Provider Notes (Signed)
CSN: [id number]     Arrival date & time 01/27/14  1425 History   First MD Initiated Contact with Patient 01/27/14 1458     Chief Complaint  Patient presents with  . Altered Mental Status   Level V caveat for altered mental status  (Consider location/radiation/quality/duration/timing/severity/associated sxs/prior Treatment) HPI Husband states his wife is doing well and was walking and acting normally until 2 days ago. He states she stopped eating about 3 days ago. He is not sure she may have had some abdominal pain. He states she has been drinking breeze. He states yesterday she had vomiting in the morning and again in the evening. She had a cough for a couple days earlier in the week but that is gone now. He denies nausea, diarrhea, or fever. He started noticing confusion 2 days ago. He states she is also not walking now for the past 2 days. He reports she was diagnosed with pancreatic cancer in September 2014 and had chemotherapy. In December she was getting radiation therapy and she fell at the hospital when she became dehydrated. She is currently in the seventh week of an 8 week of chemotherapy. He states her chemotherapy could not be done this week because her platelet count was too low. He also reports she has dyspnea on exertion.   PCP Dr Virgina Jock Oncologist Dr Marin Olp  Past Medical History  Diagnosis Date  . Hypertension   . Hyperlipidemia   . GERD (gastroesophageal reflux disease)   . Asthma   . Obstructive jaundice 06/2013  . Pancreatitis   . Arthritis   . Cancer   . Heart murmur   . Thyroid disease   . Pancreatic cancer 07/05/13    pancreatic head=adenocarcinoma   Past Surgical History  Procedure Laterality Date  . Neck surgery  2005/2007  . Hand surgery  2010  . Cataract extraction    . Abdominal hysterectomy    . Eus N/A 07/05/2013    Procedure: ESOPHAGEAL ENDOSCOPIC ULTRASOUND (EUS) RADIAL;  Surgeon: Arta Silence, MD;  Location: [facility];  Service: Endoscopy;   Laterality: N/A;  . Ercp N/A 07/05/2013    Procedure: ENDOSCOPIC RETROGRADE CHOLANGIOPANCREATOGRAPHY (ERCP);  Surgeon: Arta Silence, MD;  Location: [name];  Service: Endoscopy;  Laterality: N/A;  . Buttock cyst removal    . Cholecystectomy     Family History  Problem Relation Age of Onset  . Cancer Daughter     Breast   History  Substance Use Topics  . Smoking status: Former Smoker -- 1.00 packs/day for 24 years    Types: Cigarettes    Start date: 11/10/1988    Quit date: 06/29/2013  . Smokeless tobacco: Never Used     Comment: 06-2014  quit  7 months ago.  . Alcohol Use: 0.6 oz/week    1 Glasses of wine per week     Comment: SOMETIMES   Lives at home Lives with spouse  OB History   Grav Para Term Preterm Abortions TAB SAB Ect Mult Living                 Review of Systems  Unable to perform ROS: Mental status change      Allergies  Alka-seltzer  Home Medications   Prior to Admission medications   Medication Sig Start Date End Date Taking? Authorizing Provider  acetaminophen (TYLENOL) 325 MG tablet Take 325 mg by mouth 3 (three) times daily.     Historical Provider, MD  Cholecalciferol (VITAMIN D) 400 UNITS capsule Take  400 Units by mouth daily.     Historical Provider, MD  citalopram (CELEXA) 20 MG tablet Take 1 tablet (20 mg total) by mouth every evening. 11/22/13   Volanda Napoleon, MD  docusate sodium (COLACE) 100 MG capsule Take 1 capsule (100 mg total) by mouth daily. 11/22/13   Volanda Napoleon, MD  feeding supplement, RESOURCE BREEZE, (RESOURCE BREEZE) LIQD Take 1 Container by mouth 2 (two) times daily between meals. 09/07/13   Precious Reel, MD  fentaNYL (DURAGESIC - DOSED MCG/HR) 25 MCG/HR patch Place 1 patch (25 mcg total) onto the skin every 3 (three) days. HOSPICE PATIENT 01/10/14   Volanda Napoleon, MD  furosemide (LASIX) 20 MG tablet Take 20 mg by mouth every morning.    Historical Provider, MD  lactose free nutrition (BOOST) LIQD Take 237 mLs by mouth  daily.    Historical Provider, MD  lidocaine-prilocaine (EMLA) cream Apply 1 application topically as needed. APPLY TO PORT SITE 1HR PRIOR TO CHEMO APPOINTMENT. 11/17/13   Volanda Napoleon, MD  lipase/protease/amylase (CREON-12/PANCREASE) 12000 UNITS CPEP capsule Take 1 capsule by mouth daily. 11/22/13   Volanda Napoleon, MD  LORazepam (ATIVAN) 0.5 MG tablet Take 1 tablet (0.5 mg total) by mouth every 6 (six) hours as needed (Nausea or vomiting). 11/22/13   Volanda Napoleon, MD  naproxen sodium (ANAPROX) 220 MG tablet Take 1 tablet (220 mg total) by mouth 2 (two) times daily as needed (pain). 11/22/13   Volanda Napoleon, MD  omeprazole (PRILOSEC) 20 MG capsule Take 20 mg by mouth daily.    Historical Provider, MD  ondansetron (ZOFRAN) 8 MG tablet Take 1 tablet (8 mg total) by mouth 2 (two) times daily as needed (Nausea or vomiting). 12/26/13   Volanda Napoleon, MD  potassium chloride SA (K-DUR,KLOR-CON) 20 MEQ tablet Take 1 tablet (20 mEq total) by mouth 2 (two) times daily. 12/06/13   Volanda Napoleon, MD  prochlorperazine (COMPAZINE) 10 MG tablet Take 1 tablet (10 mg total) by mouth every 6 (six) hours as needed (Nausea or vomiting). 12/26/13   Volanda Napoleon, MD  verapamil (CALAN) 120 MG tablet Take 120 mg by mouth every morning.    Historical Provider, MD   BP 137/62  Pulse 99  Temp(Src) 98.1 F (36.7 C) (Rectal)  Resp 15  SpO2 94%  Vital signs normal   Physical Exam  Nursing note and vitals reviewed. Constitutional: She appears well-developed and well-nourished.  Non-toxic appearance. She does not appear ill. No distress.  HENT:  Head: Normocephalic and atraumatic.  Right Ear: External ear normal.  Left Ear: External ear normal.  Nose: Nose normal. No mucosal edema or rhinorrhea.  Mouth/Throat: Mucous membranes are normal. No dental abscesses or uvula swelling.  Dry lips and tongue  Eyes: Conjunctivae and EOM are normal. Pupils are equal, round, and reactive to light.  Neck: Normal range of  motion and full passive range of motion without pain. Neck supple.  Cardiovascular: Normal rate, regular rhythm and normal heart sounds.  Exam reveals no gallop and no friction rub.   No murmur heard. Pulmonary/Chest: Effort normal and breath sounds normal. No respiratory distress. She has no wheezes. She has no rhonchi. She has no rales. She exhibits no tenderness and no crepitus.  Abdominal: Soft. Normal appearance and bowel sounds are normal. She exhibits no distension. There is no tenderness. There is no rebound and no guarding.  Musculoskeletal: Normal range of motion. She exhibits no edema and no tenderness.  Moves all extremities well.   Neurological: She has normal strength. No cranial nerve deficit.  Sleepy but arousable, follows commands slowly  Skin: Skin is warm, dry and intact. No rash noted. No erythema. No pallor.  Psychiatric: Her affect is blunt. Her speech is delayed. She is slowed.  Patient's speech is very hard to understand and she doesn't seem to answer the question that was asked    ED Course  Procedures (including critical care time)  Medications  sodium chloride 0.9 % bolus 700 mL (0 mLs Intravenous Stopped 01/27/14 1654)  sodium chloride 0.9 % bolus 500 mL (0 mLs Intravenous Stopped 01/27/14 1723)   Patient given IV fluids for her apparent dehydration. Patient was prepared for a blood transfusion.  Review of patient's laboratory results on April 22, platelet count was 25,000, hemoglobin was 8.6. CT of head was done to rule out acute intracranial bleed with her altered mental status.  Results of tests discussed with husband and son. Agreeable for admission.   17:24 Dr Dagmar Hait, states to have hospitalist admit  17:37 Dr Domenic Polite, admit to med-surg, team 8  Requests hemoccult  Labs Review Results for orders placed during the hospital encounter of 01/27/14  CBC      Result Value Ref Range   WBC 9.0  4.0 - 10.5 K/uL   RBC 2.39 (*) 3.87 - 5.11 MIL/uL    Hemoglobin 6.6 (*) 12.0 - 15.0 g/dL   HCT 19.5 (*) 36.0 - 46.0 %   MCV 81.6  78.0 - 100.0 fL   MCH 27.6  26.0 - 34.0 pg   MCHC 33.8  30.0 - 36.0 g/dL   RDW 20.3 (*) 11.5 - 15.5 %   Platelets 73 (*) 150 - 400 K/uL  COMPREHENSIVE METABOLIC PANEL      Result Value Ref Range   Sodium 134 (*) 137 - 147 mEq/L   Potassium 5.2  3.7 - 5.3 mEq/L   Chloride 98  96 - 112 mEq/L   CO2 23  19 - 32 mEq/L   Glucose, Bld 88  70 - 99 mg/dL   BUN 36 (*) 6 - 23 mg/dL   Creatinine, Ser 1.54 (*) 0.50 - 1.10 mg/dL   Calcium 9.7  8.4 - 10.5 mg/dL   Total Protein 6.0  6.0 - 8.3 g/dL   Albumin 2.0 (*) 3.5 - 5.2 g/dL   AST 109 (*) 0 - 37 U/L   ALT 50 (*) 0 - 35 U/L   Alkaline Phosphatase 199 (*) 39 - 117 U/L   Total Bilirubin 0.6  0.3 - 1.2 mg/dL   GFR calc non Af Amer 32 (*) >90 mL/min   GFR calc Af Amer 37 (*) >90 mL/min  URINALYSIS, ROUTINE W REFLEX MICROSCOPIC      Result Value Ref Range   Color, Urine YELLOW  YELLOW   APPearance CLEAR  CLEAR   Specific Gravity, Urine 1.017  1.005 - 1.030   pH 5.0  5.0 - 8.0   Glucose, UA NEGATIVE  NEGATIVE mg/dL   Hgb urine dipstick NEGATIVE  NEGATIVE   Bilirubin Urine NEGATIVE  NEGATIVE   Ketones, ur NEGATIVE  NEGATIVE mg/dL   Protein, ur NEGATIVE  NEGATIVE mg/dL   Urobilinogen, UA 1.0  0.0 - 1.0 mg/dL   Nitrite NEGATIVE  NEGATIVE   Leukocytes, UA NEGATIVE  NEGATIVE  TROPONIN I      Result Value Ref Range   Troponin I <0.30  <0.30 ng/mL  PRO B NATRIURETIC PEPTIDE  Result Value Ref Range   Pro B Natriuretic peptide (BNP) 1669.0 (*) 0 - 450 pg/mL  CBG MONITORING, ED      Result Value Ref Range   Glucose-Capillary 90  70 - 99 mg/dL  TYPE AND SCREEN      Result Value Ref Range   ABO/RH(D) O NEG     Antibody Screen NEG     Sample Expiration 01/30/2014     Unit Number [id number]     Blood Component Type RED CELLS,LR     Unit division 00     Status of Unit ISSUED     Transfusion Status OK TO TRANSFUSE     Crossmatch Result Compatible     Unit  Number [id number]     Blood Component Type RCLI PHER 1     Unit division 00     Status of Unit ALLOCATED     Transfusion Status OK TO TRANSFUSE     Crossmatch Result Compatible    PREPARE RBC (CROSSMATCH)      Result Value Ref Range   Order Confirmation ORDER PROCESSED BY BLOOD BANK     Laboratory interpretation all normal except acute anemia, improving thrombocytopenia, elevated LFT's. Renal insuffic    Imaging Review Ct Head Wo Contrast  01/27/2014   CLINICAL DATA:  Altered mental status, on chemotherapy for pancreatic cancer  EXAM: CT HEAD WITHOUT CONTRAST  TECHNIQUE: Contiguous axial images were obtained from the base of the skull through the vertex without intravenous contrast.  COMPARISON:  09/04/2013  FINDINGS: No evidence of parenchymal hemorrhage or extra-axial fluid collection. No mass lesion, mass effect, or midline shift.  No CT evidence of acute infarction.  Cerebral volume is within normal limits.  No ventriculomegaly.  The visualized paranasal sinuses are essentially clear. The mastoid air cells are unopacified.  No evidence of calvarial fracture.  IMPRESSION: No evidence of acute intracranial abnormality.   Electronically Signed   By: Charline BillsSriyesh  Krishnan M.D.   On: 01/27/2014 16:17   Dg Chest Portable 1 View  01/27/2014   CLINICAL DATA:  Altered mental status. Shortness of breath and cough.  EXAM: PORTABLE CHEST - 1 VIEW  COMPARISON:  CT chest, abdomen and pelvis 10/03/2013. Single view of the chest 07/27/2013.  FINDINGS: Port-A-Cath is in place. Heart size is upper normal. Lungs are clear. No pneumothorax or pleural effusion.  IMPRESSION: No acute disease.   Electronically Signed   By: Drusilla Kannerhomas  Dalessio M.D.   On: 01/27/2014 15:59     EKG Interpretation None       Date: 01/27/2014  Rate: 96  Rhythm: normal sinus rhythm  QRS Axis: normal  Intervals: QT prolonged  ST/T Wave abnormalities: nonspecific T wave changes  Conduction Disutrbances:none  Narrative  Interpretation:   Old EKG Reviewed: unchanged    MDM   Final diagnoses:  Dehydration  Confusion  Weakness  Anemia    Plan admission   Devoria AlbeIva Dearra Myhand, MD, Armando GangFACEP     Ward GivensIva L Felesia Stahlecker, MD 01/28/14 0002

## 2014-01-27 NOTE — ED Notes (Signed)
Per EMS- per husband pt has had increased confusion, slow to respond, "talking out of her head." Upon EMS arrival following commands at time, could tell them her name but not year or month. Could give family information. Did relay to EMS she didn't "want to talk to her husband." Husband does have 69. No noticeable neurological deficits. 12 Lead unremarkable. SR/ST. No ST changes noted. No facial droop. Hx Pancreatic CA. VSS. BP 144/72 HR 100 RR 18 SpO2 97% CBG 93.

## 2014-01-28 DIAGNOSIS — T451X5A Adverse effect of antineoplastic and immunosuppressive drugs, initial encounter: Secondary | ICD-10-CM

## 2014-01-28 DIAGNOSIS — G9341 Metabolic encephalopathy: Secondary | ICD-10-CM | POA: Diagnosis present

## 2014-01-28 DIAGNOSIS — R7989 Other specified abnormal findings of blood chemistry: Secondary | ICD-10-CM

## 2014-01-28 DIAGNOSIS — D6481 Anemia due to antineoplastic chemotherapy: Secondary | ICD-10-CM

## 2014-01-28 DIAGNOSIS — E43 Unspecified severe protein-calorie malnutrition: Secondary | ICD-10-CM

## 2014-01-28 DIAGNOSIS — C259 Malignant neoplasm of pancreas, unspecified: Secondary | ICD-10-CM

## 2014-01-28 DIAGNOSIS — R630 Anorexia: Secondary | ICD-10-CM

## 2014-01-28 LAB — RETICULOCYTES
RBC.: 2.79 MIL/uL — AB (ref 3.87–5.11)
RETIC CT PCT: 0.6 % (ref 0.4–3.1)
Retic Count, Absolute: 16.7 10*3/uL — ABNORMAL LOW (ref 19.0–186.0)

## 2014-01-28 LAB — CBC
HEMATOCRIT: 23.8 % — AB (ref 36.0–46.0)
Hemoglobin: 8.1 g/dL — ABNORMAL LOW (ref 12.0–15.0)
MCH: 28.6 pg (ref 26.0–34.0)
MCHC: 34 g/dL (ref 30.0–36.0)
MCV: 84.1 fL (ref 78.0–100.0)
Platelets: 141 10*3/uL — ABNORMAL LOW (ref 150–400)
RBC: 2.83 MIL/uL — AB (ref 3.87–5.11)
RDW: 19.3 % — ABNORMAL HIGH (ref 11.5–15.5)
WBC: 11.4 10*3/uL — ABNORMAL HIGH (ref 4.0–10.5)

## 2014-01-28 LAB — IRON AND TIBC
Iron: 111 ug/dL (ref 42–135)
Saturation Ratios: 56 % — ABNORMAL HIGH (ref 20–55)
TIBC: 197 ug/dL — ABNORMAL LOW (ref 250–470)
UIBC: 86 ug/dL — ABNORMAL LOW (ref 125–400)

## 2014-01-28 LAB — URINALYSIS W MICROSCOPIC (NOT AT ARMC)
GLUCOSE, UA: NEGATIVE mg/dL
Hgb urine dipstick: NEGATIVE
KETONES UR: NEGATIVE mg/dL
Leukocytes, UA: NEGATIVE
Nitrite: NEGATIVE
Protein, ur: NEGATIVE mg/dL
SPECIFIC GRAVITY, URINE: 1.018 (ref 1.005–1.030)
Urobilinogen, UA: 2 mg/dL — ABNORMAL HIGH (ref 0.0–1.0)
pH: 5 (ref 5.0–8.0)

## 2014-01-28 LAB — URINE CULTURE
CULTURE: NO GROWTH
Colony Count: NO GROWTH

## 2014-01-28 LAB — BASIC METABOLIC PANEL
BUN: 31 mg/dL — AB (ref 6–23)
CHLORIDE: 102 meq/L (ref 96–112)
CO2: 23 mEq/L (ref 19–32)
Calcium: 9.6 mg/dL (ref 8.4–10.5)
Creatinine, Ser: 1.31 mg/dL — ABNORMAL HIGH (ref 0.50–1.10)
GFR calc Af Amer: 45 mL/min — ABNORMAL LOW (ref >90)
GFR calc non Af Amer: 39 mL/min — ABNORMAL LOW (ref >90)
GLUCOSE: 101 mg/dL — AB (ref 70–99)
POTASSIUM: 4.8 meq/L (ref 3.7–5.3)
SODIUM: 138 meq/L (ref 137–147)

## 2014-01-28 LAB — PREPARE RBC (CROSSMATCH)

## 2014-01-28 LAB — VITAMIN B12: Vitamin B-12: 1333 pg/mL — ABNORMAL HIGH (ref 211–911)

## 2014-01-28 LAB — LACTIC ACID, PLASMA: Lactic Acid, Venous: 2 mmol/L (ref 0.5–2.2)

## 2014-01-28 LAB — FOLATE

## 2014-01-28 LAB — FERRITIN: Ferritin: 1419 ng/mL — ABNORMAL HIGH (ref 10–291)

## 2014-01-28 MED ORDER — SORBITOL 70 % SOLN
30.0000 mL | Freq: Every day | Status: DC | PRN
  Administered 2014-01-28: 30 mL via ORAL

## 2014-01-28 MED ORDER — PANTOPRAZOLE SODIUM 40 MG PO TBEC
40.0000 mg | DELAYED_RELEASE_TABLET | Freq: Every day | ORAL | Status: DC
  Filled 2014-01-28: qty 1

## 2014-01-28 MED ORDER — FUROSEMIDE 10 MG/ML IJ SOLN
40.0000 mg | Freq: Once | INTRAMUSCULAR | Status: AC
  Administered 2014-01-28: 40 mg via INTRAVENOUS
  Filled 2014-01-28: qty 4

## 2014-01-28 MED ORDER — PIPERACILLIN-TAZOBACTAM 3.375 G IVPB
3.3750 g | Freq: Three times a day (TID) | INTRAVENOUS | Status: DC
Start: 2014-01-28 — End: 2014-02-05
  Administered 2014-01-28 – 2014-02-05 (×23): 3.375 g via INTRAVENOUS
  Filled 2014-01-28 (×24): qty 50

## 2014-01-28 MED ORDER — HYDRALAZINE HCL 20 MG/ML IJ SOLN
10.0000 mg | Freq: Four times a day (QID) | INTRAMUSCULAR | Status: DC | PRN
  Administered 2014-01-28 – 2014-02-04 (×4): 10 mg via INTRAVENOUS
  Filled 2014-01-28 (×5): qty 0.5

## 2014-01-28 NOTE — Progress Notes (Addendum)
Progress Note   Carolyn [name] WCH:[id number] DOB: 05-01-1936 DOA: 01/27/2014 PCP: Precious Reel, MD   Brief Narrative:   Carolyn [name] is an 78 y.o. female with a PMH of locally advanced pancreatic cancer obstructive jaundice status post biliary stent, currently being treated with single agent gemcitabine, last dose 01/17/14 (dose held 01/24/14 secondary to abnormal labs) who was admitted 01/28/14 with a one-week history of progressive weakness, anorexia, and lethargy. Upon initial evaluation in the ED, patient was noted to have a hemoglobin of 6.6 and a creatinine of 1.54.  Assessment/Plan:   Principal Problem:   Pancreatic cancer with chemotherapy associated anemia and thrombocytopenia and failure to thrive  The patient was admitted and a septic workup undertaken. No evidence of pneumonia on chest x-ray, urinalysis negative for nitrites and leukocytes. Culture pending. Check lactic acid.  One unit of packed red blood cells ordered by admitting physician, and an additional 2 units ordered by oncologist. Nursing staff confirmed that the oncologist wanted to additional units to be given. Will give Lasix between units given high-risk of volume overload. EF 65-70% with mild diastolic dysfunction on echo done 05/26/13. BNP elevated.  Most recent chemotherapy held secondary to thrombocytopenia. Platelet count recovered.  Dr. Marin Olp aware of the patient's admission. Active Problems:   Severe protein calorie malnutrition / Anorexia The patient is anorexic and likely cachectic from her underlying malignancy. Doubt she will eat in her present state. When status improves, will get dietitian consult.   Elevated LFTs  Has a stent in the common bile duct. Check ultrasound to make sure stent is functional.  Given encephalopathy, I am concerned that she is developing sepsis. She has a known stent in the common bile duct and rising LFTs. I will start her on empiric Zosyn for now.    Dehydration  Continue to hydrate.   Confusion / metabolic encephalopathy  Likely from metabolic encephalopathy. Urinalysis negative for nitrites and leukocytes. Chest x-ray clear. LFTs elevated. No clear source of infection, but with stent in the common bile duct, may have an intra-abdominal infection. Ultrasound ordered. Start empiric Zosyn.  CT of the head done on admission was negative.   DVT Prophylaxis  Continue SCDs.  Code Status: Full. Family Communication: Husband updated at the bedside. Disposition Plan: Home when stable.   IV Access:    Port-A-Cath   Procedures:    None.   Medical Consultants:    Dr. Burney Gauze, Oncology   Other Consultants:    None.   Anti-Infectives:    Zosyn 01/28/14--->  Subjective:   Carolyn [name] is lethargic and encephalopathic, talking nonsensically. Her husband is at the bedside and says she told him "I am dying ".  Objective:    Filed Vitals:   01/27/14 1955 01/27/14 2053 01/28/14 0506 01/28/14 0815  BP: 149/55 165/67 176/70 188/76  Pulse: 92 100 104   Temp: 98.6 F (37 C) 98.6 F (37 C) 98.4 F (36.9 C)   TempSrc: Oral Oral Oral   Resp: 16 16 12    Height:  5\' 3"  (1.6 m)    Weight:  82.373 kg (181 lb 9.6 oz)    SpO2: 92% 97% 96%     Intake/Output Summary (Last 24 hours) at 01/28/14 0902 Last data filed at 01/28/14 0411  Gross per 24 hour  Intake    960 ml  Output      0 ml  Net    960 ml    Exam: Gen:  NAD Cardiovascular:  Tachycardic,  No M/R/G Respiratory:  Lungs diminished Gastrointestinal:  Abdomen soft, NT/ND, + BS Extremities:  Trace edema   Data Reviewed:    Labs: Basic Metabolic Panel:  Recent Labs Lab 01/24/14 1307 01/27/14 1455 01/28/14 0545  NA 137 134* 138  K 4.9* 5.2 4.8  CL 97* 98 102  CO2 22 23 23   GLUCOSE 149* 88 101*  BUN 34* 36* 31*  CREATININE 1.9* 1.54* 1.31*  CALCIUM 9.4 9.7 9.6   GFR Estimated Creatinine Clearance: 37.7 ml/min (by C-G formula based on Cr  of 1.31). Liver Function Tests:  Recent Labs Lab 01/24/14 1307 01/27/14 1455  AST 68* 109*  ALT 28 50*  ALKPHOS 175* 199*  BILITOT 0.60 0.6  PROT 6.7 6.0  ALBUMIN  --  2.0*    CBC:  Recent Labs Lab 01/24/14 1307 01/27/14 1455 01/28/14 0545  WBC 5.4 9.0 11.4*  NEUTROABS 4.8  --   --   HGB 8.6* 6.6* 8.1*  HCT 25.7* 19.5* 23.8*  MCV 86 81.6 84.1  PLT 25* 73* 141*   Cardiac Enzymes:  Recent Labs Lab 01/27/14 1455  TROPONINI <0.30   BNP (last 3 results)  Recent Labs  01/27/14 1527  PROBNP 1669.0*   CBG:  Recent Labs Lab 01/27/14 1440  GLUCAP 90   Anemia work up:  Recent Labs  01/28/14 0001  RETICCTPCT 0.6   Sepsis Labs:  Recent Labs Lab 01/24/14 1307 01/27/14 1455 01/28/14 0545  WBC 5.4 9.0 11.4*   Microbiology No results found for this or any previous visit (from the past 240 hour(s)).   Radiographs/Studies:   Ct Head Wo Contrast  01/27/2014   CLINICAL DATA:  Altered mental status, on chemotherapy for pancreatic cancer  EXAM: CT HEAD WITHOUT CONTRAST  TECHNIQUE: Contiguous axial images were obtained from the base of the skull through the vertex without intravenous contrast.  COMPARISON:  09/04/2013  FINDINGS: No evidence of parenchymal hemorrhage or extra-axial fluid collection. No mass lesion, mass effect, or midline shift.  No CT evidence of acute infarction.  Cerebral volume is within normal limits.  No ventriculomegaly.  The visualized paranasal sinuses are essentially clear. The mastoid air cells are unopacified.  No evidence of calvarial fracture.  IMPRESSION: No evidence of acute intracranial abnormality.   Electronically Signed   By: Julian Hy M.D.   On: 01/27/2014 16:17   Dg Chest Portable 1 View  01/27/2014   CLINICAL DATA:  Altered mental status. Shortness of breath and cough.  EXAM: PORTABLE CHEST - 1 VIEW  COMPARISON:  CT chest, abdomen and pelvis 10/03/2013. Single view of the chest 07/27/2013.  FINDINGS: Port-A-Cath is in  place. Heart size is upper normal. Lungs are clear. No pneumothorax or pleural effusion.  IMPRESSION: No acute disease.   Electronically Signed   By: Inge Rise M.D.   On: 01/27/2014 15:59    Medications:   . citalopram  20 mg Oral QPM  . docusate sodium  100 mg Oral Daily  . feeding supplement (RESOURCE BREEZE)  1 Container Oral TID BM  . fentaNYL  25 mcg Transdermal Q72H  . lipase/protease/amylase  1 capsule Oral Daily  . pantoprazole (PROTONIX) IV  40 mg Intravenous QHS  . potassium chloride SA  20 mEq Oral BID  . verapamil  120 mg Oral q morning - 10a   Continuous Infusions: . sodium chloride    . sodium chloride 75 mL/hr at 01/28/14 0411    Time spent: 35 minutes with > 50% of time discussing  current diagnostic test results, clinical impression and plan of care.    LOS: 1 day   [place]  [facility] Pager [phone]. If unable to reach me by pager, please call my cell phone at [phone].  *Please refer to [email or web address], password [email or web address] to get updated schedule on who will round on this patient, as hospitalists switch teams weekly. If 7PM-7AM, please contact night-coverage at [email or web address], password [email or web address] for any overnight needs.  01/28/2014, 9:02 AM    **Disclaimer: This note was dictated with voice recognition software. Similar sounding words can inadvertently be transcribed and this note may contain transcription errors which may not have been corrected upon publication of note.**   Information printed out and reviewed with the patient/family:     In an effort to keep you and your family informed about your hospital stay, I am providing you with this information sheet. If you or your family have any questions, please do not hesitate to have the nursing staff page me to set up a meeting time.  Also note that the hospitalist doctors typically change on Tuesdays or Wednesdays to a different hospitalist doctor.  Carolyn [name] 01/28/2014 1 (Number of days in the  hospital)  Treatment team:  Dr. Jacquelynn Cree, Hospitalist (Internist)  Dr. Burney Gauze (Cancer specialist)  Active Treatment Issues with Plan: Principal Problem:   Pancreatic cancer with chemotherapy induced side effects including weakness, poor appetite, and blood count abnormalities.   No evidence of pneumonia on chest x-ray.  2 units of blood ordered for low red blood cell count, which can cause you to feel weak and short of breath. Active Problems:   Dehydration  Continue IV fluids.   Confusion  This is likely from dehydration and side effects from chemotherapy.  CT of the head done on admission was negative.   Blood clot prevention  The nurses will apply compression devices to your legs to help prevent blood clots.  Anticipated discharge date: Depends on progress.

## 2014-01-28 NOTE — Progress Notes (Signed)
ANTIBIOTIC CONSULT NOTE - INITIAL  Pharmacy Consult for Zosyn Indication: Intra-abdominal Infection  Allergies  Allergen Reactions  . Alka-Seltzer [Aspirin Effervescent] Swelling    Patient Measurements: Height: 5\' 3"  (160 cm) Weight: 181 lb 9.6 oz (82.373 kg) IBW/kg (Calculated) : 52.4  Vital Signs: Temp: 97.8 F (36.6 C) (04/26 1323) Temp src: Oral (04/26 1323) BP: 172/65 mmHg (04/26 1323) Pulse Rate: 100 (04/26 1323) Intake/Output from previous day: 04/25 0701 - 04/26 0700 In: 960 [I.V.:612.5; Blood:347.5] Out: -   Labs:  Recent Labs  01/27/14 1455 01/28/14 0545  WBC 9.0 11.4*  HGB 6.6* 8.1*  PLT 73* 141*  CREATININE 1.54* 1.31*   Estimated Creatinine Clearance: 37.7 ml/min (by C-G formula based on Cr of 1.31).  Microbiology: No results found for this or any previous visit (from the past 720 hour(s)).  Medical History: Past Medical History  Diagnosis Date  . Hypertension   . Hyperlipidemia   . GERD (gastroesophageal reflux disease)   . Asthma   . Obstructive jaundice 06/2013  . Pancreatitis   . Arthritis   . Cancer   . Heart murmur   . Thyroid disease   . Pancreatic cancer 07/05/13    pancreatic head=adenocarcinoma    Medications:  Anti-infectives   None     Assessment: 35 yoF admitted 4/25 with altered mental status, N/V in the setting of pancreatic cancer (diagnosed 06/2013) currently receiving chemotherapy (last cycle on 4/15, next due on 4/22).  Concern for intra-abdominal infection or sepsis.  She has encephalopathy, known bile duct stent and rising LFTs.  Tmax: 98.9  WBCs: rising, 11.4  Renal: SCr 1.54 > 1.31, CrCl ~ 38 ml/min  Goal of Therapy:  Appropriate abx dosing, eradication of infection.   Plan:   Zosyn 3.375g IV Q8H infused over 4hrs.  Follow up renal function and culture results.  Gretta Arab PharmD, BCPS Pager [phone] 01/28/2014 1:58 PM

## 2014-01-28 NOTE — Progress Notes (Signed)
Bladder scan performed after family reports no urine output during night.  Scan revealed 370cc of urine in bladder, per md order foley catheter placed for acute urinary retention. Patient tolerated procedure well, clear dark yellow urine returned and sample sent to lab for UA. Will continue to monitor.

## 2014-01-28 NOTE — Consult Note (Signed)
#   [id number] is consult note.  Stormy Card 3:23

## 2014-01-28 NOTE — Consult Note (Signed)
NAMEMarland Kitchen  DONICA, DEROUIN NO.:  [id number]  MEDICAL RECORD NO.:  [id number]  LOCATION:  7893                         FACILITY:  [facility]  PHYSICIAN:  Volanda Napoleon, M.D.  DATE OF BIRTH:  [birth year]  DATE OF CONSULTATION:  01/28/2014 DATE OF DISCHARGE:                                CONSULTATION   REFERRING PHYSICIAN:  Domenic Polite, MD  REASON FOR CONSULTATION: 1. Locally advanced pancreatic cancer. 2. Confusion.  HISTORY OF PRESENT ILLNESS:  Ms. Kirkey is well-known to me.  She is a 78 year old [place] American female.  She has locally advanced pancreatic cancer.  She has been on chemotherapy with gemcitabine.  Her last dose was given 10 days ago.  She came into the emergency room with some confusion.  She had been at home.  She had been doing fairly well at home.  When she was admitted, her hemoglobin was 6.6.  She has had no problems with bleeding.  She has had no nausea or vomiting.  She had a platelet count in the 73K when she came into the hospital.  She had a CT of the brain.  This was unremarkable.  There is no obvious bleeding or thrombotic event.  A chest x-ray was done.  This also was negative.  Her renal function looked okay.  Her BUN was 36, creatinine was 1.54. She did have some elevated liver function tests.  Her pro-beta-natriuretic peptide was 1700.  She has had 2 units of blood so far.  She is somewhat paranoid.  She is not talking to me.  She just stares.  She had no obvious fever.  Urine culture is pending.  There has been no change in her medications.  She has been on fentanyl patch for her pain.  She has been on Celexa for depression.  Her blood pressure is on the high side right now.  There has been no diarrhea.  She has had no constipation.  PAST MEDICAL HISTORY:  Well recorded in the chart.  ALLERGIES:  ASPIRIN.  ADMISSION MEDICATIONS: 1. Celexa 20 mg p.o. daily. 2. Duragesic patch 25 mcg to the skin every 3  days. 3. Lasix 20 mg p.o. daily. 4. Creon capsules 12,000 units 1 p.o. daily. 5. Ativan 0.5 mg q.6 hours p.r.n. 6. MSIR 15 mg p.o. q.4 hours p.r.n. 7. Naprosyn 220 mg p.o. b.i.d. p.r.n. 8. Prilosec 20 mg p.o. b.i.d. 9. Potassium 20 mEq p.o. b.i.d. 10.Verapamil 120 mg p.o. daily. 11.Compazine 10 mg p.o. q.6 hours p.r.n.  SOCIAL HISTORY:  Negative for tobacco or alcohol use.  PHYSICAL EXAMINATION:  GENERAL:  This is an elderly African American female in no obvious distress. VITAL SIGNS:  Temperature of 98.4, pulse 104, blood pressure 188/76, oxygen saturation is 96%. HEAD AND NECK:  No ocular or oral lesions.  She really would not open her mouth for me.  There is no stiffness in her neck. LUNGS:  Clear. CARDIAC:  Regular rate and rhythm with a normal S1 and S2.  There are no murmurs, rubs, or bruits. ABDOMEN:  Soft.  Bowel sounds are decreased.  There is no guarding or rebound tenderness.  There is no palpable abdominal mass.  There is no palpable liver or spleen tip. BACK:  No tenderness over the spine. EXTREMITIES:  Some chronic trace edema in her legs. SKIN:  No rashes.  IMPRESSION:  Ms. Perine is a 78 year old African American female.  She has locally advanced pancreatic cancer.  She has been on gemcitabine.  Again, we held her dose on the 22nd because of her thrombocytopenia.  She is due for some scans to see how her cancer is responding.  I think we will have to do these while she is in the hospital.  She came in quite anemic.  I have to believe this is from the gemcitabine.  I do not think she is having any bleeding anywhere.  She is not ready to do any scans right now.  I am not sure she will be able to cooperate.  Hopefully, once we get her blood count a little bit better, she will be able to get back to her baseline status.  Her niece was with her.  I talked to her niece.  We will see what the cultures show.  I do not think she has any obvious liver  abnormalities, although her liver tests are up.  We will follow her along.  I very much appreciate the outstanding help from the hospitalist and from the staff on [address].     Volanda Napoleon, M.D.     PRE/MEDQ  D:  01/28/2014  T:  01/28/2014  Job:  [id number]

## 2014-01-29 ENCOUNTER — Inpatient Hospital Stay (HOSPITAL_COMMUNITY): Payer: Medicare Other

## 2014-01-29 DIAGNOSIS — E7220 Disorder of urea cycle metabolism, unspecified: Secondary | ICD-10-CM | POA: Diagnosis present

## 2014-01-29 DIAGNOSIS — N289 Disorder of kidney and ureter, unspecified: Secondary | ICD-10-CM

## 2014-01-29 DIAGNOSIS — R29818 Other symptoms and signs involving the nervous system: Secondary | ICD-10-CM

## 2014-01-29 LAB — TYPE AND SCREEN
ABO/RH(D): O NEG
Antibody Screen: NEGATIVE
UNIT DIVISION: 0
UNIT DIVISION: 0
Unit division: 0

## 2014-01-29 LAB — COMPREHENSIVE METABOLIC PANEL
ALT: 53 U/L — AB (ref 0–35)
AST: 114 U/L — ABNORMAL HIGH (ref 0–37)
Albumin: 2.2 g/dL — ABNORMAL LOW (ref 3.5–5.2)
Alkaline Phosphatase: 224 U/L — ABNORMAL HIGH (ref 39–117)
BUN: 31 mg/dL — AB (ref 6–23)
CALCIUM: 9.4 mg/dL (ref 8.4–10.5)
CO2: 22 meq/L (ref 19–32)
CREATININE: 1.39 mg/dL — AB (ref 0.50–1.10)
Chloride: 101 mEq/L (ref 96–112)
GFR calc non Af Amer: 36 mL/min — ABNORMAL LOW (ref >90)
GFR, EST AFRICAN AMERICAN: 42 mL/min — AB (ref >90)
Glucose, Bld: 102 mg/dL — ABNORMAL HIGH (ref 70–99)
Potassium: 4.2 mEq/L (ref 3.7–5.3)
SODIUM: 138 meq/L (ref 137–147)
TOTAL PROTEIN: 6.1 g/dL (ref 6.0–8.3)
Total Bilirubin: 1.6 mg/dL — ABNORMAL HIGH (ref 0.3–1.2)

## 2014-01-29 LAB — CBC
HCT: 30.6 % — ABNORMAL LOW (ref 36.0–46.0)
HEMOGLOBIN: 10.4 g/dL — AB (ref 12.0–15.0)
MCH: 28.2 pg (ref 26.0–34.0)
MCHC: 34 g/dL (ref 30.0–36.0)
MCV: 82.9 fL (ref 78.0–100.0)
Platelets: 298 10*3/uL (ref 150–400)
RBC: 3.69 MIL/uL — ABNORMAL LOW (ref 3.87–5.11)
RDW: 17.9 % — ABNORMAL HIGH (ref 11.5–15.5)
WBC: 14.2 10*3/uL — AB (ref 4.0–10.5)

## 2014-01-29 LAB — AMMONIA: AMMONIA: 88 umol/L — AB (ref 11–60)

## 2014-01-29 LAB — PREALBUMIN: PREALBUMIN: 6.6 mg/dL — AB (ref 17.0–34.0)

## 2014-01-29 MED ORDER — ACETAMINOPHEN 650 MG RE SUPP: 650.0000 mg | RECTAL | Status: DC | PRN

## 2014-01-29 MED ORDER — PANTOPRAZOLE SODIUM 40 MG IV SOLR
40.0000 mg | Freq: Every day | INTRAVENOUS | Status: DC
  Administered 2014-01-29 – 2014-02-04 (×7): 40 mg via INTRAVENOUS
  Filled 2014-01-29 (×9): qty 40

## 2014-01-29 MED ORDER — FENTANYL 25 MCG/HR TD PT72
37.5000 ug | MEDICATED_PATCH | TRANSDERMAL | Status: DC
  Administered 2014-01-29: 37.5 ug via TRANSDERMAL
  Filled 2014-01-29 (×2): qty 1

## 2014-01-29 MED ORDER — LACTULOSE ENEMA
300.0000 mL | Freq: Once | ORAL | Status: AC
  Administered 2014-01-29: 300 mL via RECTAL
  Filled 2014-01-29: qty 300

## 2014-01-29 MED ORDER — LACTULOSE 10 GM/15ML PO SOLN
30.0000 g | Freq: Three times a day (TID) | ORAL | Status: DC
  Administered 2014-01-29: 30 g via ORAL
  Filled 2014-01-29 (×6): qty 45

## 2014-01-29 NOTE — Progress Notes (Deleted)
Nutrition Note  -Patient screened by Malnutrition Screening Tool  -With anorexia and severe protein calorie malnutrition from malignancy, which appears to be in end stages per MD. Weight has decreased 2 lbs in past 4 months.  -RD assessment in 09/2013 had recommended Resource Breeze and comfort feeds as pt was consuming <500 kcal/day. -Pt w/severe abd pain and is not eating in current condition. 0% PO intake.    Please consult as status improves Atlee Abide MS RD LDN Clinical Dietitian [phone]

## 2014-01-29 NOTE — Progress Notes (Signed)
Nutrition Brief Note  Patient identified on the Malnutrition Screening Tool (MST) Report  Wt Readings from Last 15 Encounters:  01/27/14 181 lb 9.6 oz (82.373 kg)  01/10/14 190 lb (86.183 kg)  12/13/13 183 lb (83.008 kg)  11/22/13 179 lb (81.194 kg)  11/10/13 178 lb (80.74 kg)  10/13/13 196 lb (88.905 kg)  09/11/13 182 lb (82.555 kg)  09/05/13 183 lb 15.9 oz (83.46 kg)  08/29/13 184 lb (83.462 kg)  08/25/13 185 lb 9.6 oz (84.188 kg)  08/25/13 183 lb (83.008 kg)  08/23/13 183 lb (83.008 kg)  08/14/13 199 lb 8.3 oz (90.5 kg)  08/09/13 213 lb 6.4 oz (96.798 kg)  08/08/13 212 lb (96.163 kg)    Body mass index is 32.18 kg/(m^2). Patient meets criteria for Obesity I based on current BMI.   Current diet order is Regular, patient is consuming approximately 0% of meals at this time. Labs and medications reviewed.   -With anorexia and severe protein calorie malnutrition from malignancy, which appears to be in end stages per MD. Weight has decreased 2 lbs in past 4 months.  -RD assessment in 09/2013 had recommended Resource Breeze and comfort feeds as pt was consuming <500 kcal/day.  -Pt w/severe abd pain and is not eating in current condition.  No nutrition interventions warranted at this time. If nutrition issues arise/status improves, please consult RD.   Atlee Abide MS RD LDN Clinical Dietitian [phone]

## 2014-01-29 NOTE — Progress Notes (Signed)
Progress Note   Carolyn [name] KGY:[id number] DOB: October 20, 1935 DOA: 01/27/2014 PCP: Precious Reel, MD   Brief Narrative:   Carolyn [name] is an 78 y.o. female with a PMH of locally advanced pancreatic cancer obstructive jaundice status post biliary stent, currently being treated with single agent gemcitabine, last dose 01/17/14 (dose held 01/24/14 secondary to abnormal labs) who was admitted 01/28/14 with a one-week history of progressive weakness, anorexia, and lethargy. Upon initial evaluation in the ED, patient was noted to have a hemoglobin of 6.6 and a creatinine of 1.54. She has been persistently encephalopathic and ammonia levels are elevated. Her condition has deteriorated. Initial conversation with husband about goals of care done 01/28/14, he would like for the patient to have a CT scan of her abdomen/pelvis to find out if the cancer has gotten worse, in which case, he would favor comfort care. CT scan unable to be performed in the patient's present restless state.  Assessment/Plan:   Principal Problem:   Pancreatic cancer with chemotherapy associated anemia and thrombocytopenia and failure to thrive  The patient was admitted and a septic workup undertaken. No evidence of pneumonia on chest x-ray, urinalysis negative for nitrites and leukocytes. Culture pending. Check lactic acid.  One unit of packed red blood cells ordered by admitting physician, and an additional 2 units ordered by oncologist. Nursing staff confirmed that the oncologist wanted to additional units to be given. Given Lasix between units given high-risk of volume overload. EF 65-70% with mild diastolic dysfunction on echo done 05/26/13. BNP elevated.  Most recent chemotherapy held secondary to thrombocytopenia. Platelet count recovered.  Dr. Marin Olp following. Active Problems:   Abdominal pain  Patient has ongoing severe pain, and her husband reports that she told him "please let me die ".  Increase fentanyl patch to  37.5 mg every 3 days. Consider transition to morphine drip if pain remains uncontrolled, but would need to have CODE STATUS discussion further with husband first.   Severe protein calorie malnutrition / Anorexia  The patient is anorexic and likely cachectic from her underlying malignancy, which appears to be entering the end stages.  Doubt she will eat in her present state. When status improves, will get dietitian consult.   Elevated LFTs  Has a stent in the common bile duct. Abdominal ultrasound done 01/28/14 showing biliary stent in place, with possible liver metastasis.  Given encephalopathy, and concern for sepsis with known stent in the common bile duct and rising LFTs, she was started on empiric Zosyn 01/28/14.  No improvement in encephalopathy as of yet. Ammonia elevated. Try lactulose.   Dehydration / acute renal failure  Continue to hydrate.  The patient's baseline creatinine is 1.0, current creatinine 1.39 slightly improved with hydration (creatinine 1.54 on admission).   Confusion / metabolic encephalopathy / hyperammonemia  Likely from metabolic encephalopathy. Urinalysis negative for nitrites and leukocytes. Chest x-ray clear. LFTs elevated. No clear source of infection, but with stent in the common bile duct, may have an intra-abdominal infection. Ultrasound done 01/28/14, unrevealing. Continue empiric Zosyn. Ammonia level elevated, we'll try lactulose, but not sure she is cognizant enough to take it.  CT of the head done on admission was negative.   DVT Prophylaxis  Continue SCDs.  Code Status: Full. Family Communication: Husband updated at the bedside. Disposition Plan: Home when stable.   IV Access:    Port-A-Cath   Procedures:    None.   Medical Consultants:    Dr. Burney Gauze, Oncology   Other  Consultants:    None.   Anti-Infectives:    Zosyn 01/28/14--->  Subjective:   Carolyn [name] remains lethargic and encephalopathic, talking  nonsensically. Her husband is at the bedside and is sad that her condition and watching herself her. He tells me that she told him "please let me die ".  Objective:    Filed Vitals:   01/29/14 0040 01/29/14 0135 01/29/14 0235 01/29/14 0528  BP: 157/58 156/58 171/80 182/69  Pulse: 96 97 97 67  Temp: 98.5 F (36.9 C) 98.4 F (36.9 C) 98.7 F (37.1 C) 98.4 F (36.9 C)  TempSrc: Axillary Axillary Axillary Axillary  Resp: 16 16 16 16   Height:      Weight:      SpO2: 100% 96% 94% 94%    Intake/Output Summary (Last 24 hours) at 01/29/14 1104 Last data filed at 01/29/14 0528  Gross per 24 hour  Intake 1465.75 ml  Output   1050 ml  Net 415.75 ml    Exam: Gen:  NAD Cardiovascular:  Tachycardic, No M/R/G Respiratory:  Lungs diminished Gastrointestinal:  Abdomen soft, NT/ND, + BS Extremities:  Trace edema   Data Reviewed:    Labs: Basic Metabolic Panel:  Recent Labs Lab 01/24/14 1307  01/27/14 1455 01/28/14 0545 01/29/14 0530  NA 137  --  134* 138 138  K 4.9*  < > 5.2 4.8 4.2  CL 97*  --  98 102 101  CO2 22  --  23 23 22   GLUCOSE 149*  --  88 101* 102*  BUN 34*  --  36* 31* 31*  CREATININE 1.9*  --  1.54* 1.31* 1.39*  CALCIUM 9.4  --  9.7 9.6 9.4  < > = values in this interval not displayed. GFR Estimated Creatinine Clearance: 35.6 ml/min (by C-G formula based on Cr of 1.39). Liver Function Tests:  Recent Labs Lab 01/24/14 1307 01/27/14 1455 01/29/14 0530  AST 68* 109* 114*  ALT 28 50* 53*  ALKPHOS 175* 199* 224*  BILITOT 0.60 0.6 1.6*  PROT 6.7 6.0 6.1  ALBUMIN  --  2.0* 2.2*    CBC:  Recent Labs Lab 01/24/14 1307 01/27/14 1455 01/28/14 0545 01/29/14 0530  WBC 5.4 9.0 11.4* 14.2*  NEUTROABS 4.8  --   --   --   HGB 8.6* 6.6* 8.1* 10.4*  HCT 25.7* 19.5* 23.8* 30.6*  MCV 86 81.6 84.1 82.9  PLT 25* 73* 141* 298   Cardiac Enzymes:  Recent Labs Lab 01/27/14 1455  TROPONINI <0.30   BNP (last 3 results)  Recent Labs  01/27/14 1527    PROBNP 1669.0*   CBG:  Recent Labs Lab 01/27/14 1440  GLUCAP 90   Anemia work up:  Recent Labs  01/28/14 0001  VITAMINB12 1333*  FOLATE >20.0  FERRITIN 1419*  TIBC 197*  IRON 111  RETICCTPCT 0.6   Sepsis Labs:  Recent Labs Lab 01/24/14 1307 01/27/14 1455 01/28/14 0545 01/28/14 1600 01/29/14 0530  WBC 5.4 9.0 11.4*  --  14.2*  LATICACIDVEN  --   --   --  2.0  --    Microbiology Recent Results (from the past 240 hour(s))  URINE CULTURE     Status: None   Collection Time    01/27/14  5:16 PM      Result Value Ref Range Status   Specimen Description URINE, CATHETERIZED   Final   Special Requests NONE   Final   Culture  Setup Time     Final  Value: 01/27/2014 21:40     Performed at [place]     Final   Value: NO GROWTH     Performed at [facility]   Culture     Final   Value: NO GROWTH     Performed at [facility]   Report Status 01/28/2014 FINAL   Final     Radiographs/Studies:   Ct Head Wo Contrast  01/27/2014   CLINICAL DATA:  Altered mental status, on chemotherapy for pancreatic cancer  EXAM: CT HEAD WITHOUT CONTRAST  TECHNIQUE: Contiguous axial images were obtained from the base of the skull through the vertex without intravenous contrast.  COMPARISON:  09/04/2013  FINDINGS: No evidence of parenchymal hemorrhage or extra-axial fluid collection. No mass lesion, mass effect, or midline shift.  No CT evidence of acute infarction.  Cerebral volume is within normal limits.  No ventriculomegaly.  The visualized paranasal sinuses are essentially clear. The mastoid air cells are unopacified.  No evidence of calvarial fracture.  IMPRESSION: No evidence of acute intracranial abnormality.   Electronically Signed   By: Julian Hy M.D.   On: 01/27/2014 16:17   Dg Chest Portable 1 View  01/27/2014   CLINICAL DATA:  Altered mental status. Shortness of breath and cough.  EXAM: PORTABLE CHEST - 1 VIEW  COMPARISON:  CT  chest, abdomen and pelvis 10/03/2013. Single view of the chest 07/27/2013.  FINDINGS: Port-A-Cath is in place. Heart size is upper normal. Lungs are clear. No pneumothorax or pleural effusion.  IMPRESSION: No acute disease.   Electronically Signed   By: Inge Rise M.D.   On: 01/27/2014 15:59    Medications:   . citalopram  20 mg Oral QPM  . docusate sodium  100 mg Oral Daily  . feeding supplement (RESOURCE BREEZE)  1 Container Oral TID BM  . fentaNYL  25 mcg Transdermal Q72H  . lipase/protease/amylase  1 capsule Oral Daily  . pantoprazole (PROTONIX) IV  40 mg Intravenous QHS  . piperacillin-tazobactam (ZOSYN)  IV  3.375 g Intravenous 3 times per day  . potassium chloride SA  20 mEq Oral BID  . verapamil  120 mg Oral q morning - 10a   Continuous Infusions: . sodium chloride    . sodium chloride 75 mL/hr at 01/28/14 0411    Time spent: 35 minutes with > 50% of time discussing current diagnostic test results, clinical impression and plan of care.    LOS: 2 days   [place]  [facility] Pager [phone]. If unable to reach me by pager, please call my cell phone at [phone].  *Please refer to [email or web address], password [email or web address] to get updated schedule on who will round on this patient, as hospitalists switch teams weekly. If 7PM-7AM, please contact night-coverage at [email or web address], password [email or web address] for any overnight needs.  01/29/2014, 11:04 AM    **Disclaimer: This note was dictated with voice recognition software. Similar sounding words can inadvertently be transcribed and this note may contain transcription errors which may not have been corrected upon publication of note.**

## 2014-01-29 NOTE — Progress Notes (Signed)
Pt was unable to take lactulose po, per md order lactulose enema administered via flexi-seal. Clamp placed to aid in 30-60 min retention. Pt tolerated procedure well. Will continue to monitor.

## 2014-01-29 NOTE — Progress Notes (Signed)
[name] is about the same. She's not talking to me. She opens her eyes she just stares.  She got blood yesterday. Her hemoglobin is better. Today, he her hemoglobin is 10.4.  So far, cultures have been negative.  She's not hypercalcemic. Her corrected calcium is 11.2 which should not be a problem. She does have some mild renal insufficiency. Her liver function tests are somewhat elevated.  We may want to get a ultrasound of her abdomen to see if there is any type of liver involvement by malignancy. She's due for a CT scan this week but I still think she's able to go through that right now.  She's not iron deficient. She is not vitamin B12 deficient.  I does have a feeling that really have progressive disease. This change in neurological status might be some type of paraneoplastic syndrome. It would be hard to prove this.  Her vital signs look okay. Blood pressure is high at 182/69. She is afebrile. Lungs are clear. Cardiac exam regular in rhythm. There are no murmurs rubs or bruits. Abdomen is soft. Bowel sounds are slightly decreased. There is no obvious guarding or rebound tenderness. Extremities shows some trace edema bilaterally. She has spontaneous movement of her extremities. Skin exam no rashes.  To me at, still looks like she may have had a cerebrovascular event. An MRI which helped but she definitely is not going to be able to tolerate that right now.  An ultrasound of her abdomen might be helpful for right now. Ultimately, she will need to have a CT scan.  I does have a feeling that we are looking at progressive disease. I know that her CA 19-9 is not elevated but she is still acting as if she has progressive disease.  We will see what the ammonia level is.  I did speak with her husband. He understands what is going on. He realizes that she likely will not get anymore treatment and that we will likely get hospice involved once we have a better idea as to her malignancy status

## 2014-01-30 DIAGNOSIS — E8809 Other disorders of plasma-protein metabolism, not elsewhere classified: Secondary | ICD-10-CM

## 2014-01-30 DIAGNOSIS — K769 Liver disease, unspecified: Secondary | ICD-10-CM

## 2014-01-30 DIAGNOSIS — F29 Unspecified psychosis not due to a substance or known physiological condition: Secondary | ICD-10-CM

## 2014-01-30 DIAGNOSIS — E876 Hypokalemia: Secondary | ICD-10-CM

## 2014-01-30 LAB — CBC
HCT: 30.7 % — ABNORMAL LOW (ref 36.0–46.0)
Hemoglobin: 10.3 g/dL — ABNORMAL LOW (ref 12.0–15.0)
MCH: 28.7 pg (ref 26.0–34.0)
MCHC: 33.6 g/dL (ref 30.0–36.0)
MCV: 85.5 fL (ref 78.0–100.0)
Platelets: 458 10*3/uL — ABNORMAL HIGH (ref 150–400)
RBC: 3.59 MIL/uL — ABNORMAL LOW (ref 3.87–5.11)
RDW: 19.2 % — ABNORMAL HIGH (ref 11.5–15.5)
WBC: 14.3 10*3/uL — ABNORMAL HIGH (ref 4.0–10.5)

## 2014-01-30 LAB — COMPREHENSIVE METABOLIC PANEL
ALT: 46 U/L — AB (ref 0–35)
AST: 98 U/L — ABNORMAL HIGH (ref 0–37)
Albumin: 2.2 g/dL — ABNORMAL LOW (ref 3.5–5.2)
Alkaline Phosphatase: 226 U/L — ABNORMAL HIGH (ref 39–117)
BUN: 28 mg/dL — ABNORMAL HIGH (ref 6–23)
CALCIUM: 9.5 mg/dL (ref 8.4–10.5)
CO2: 22 mEq/L (ref 19–32)
Chloride: 106 mEq/L (ref 96–112)
Creatinine, Ser: 1.28 mg/dL — ABNORMAL HIGH (ref 0.50–1.10)
GFR calc non Af Amer: 40 mL/min — ABNORMAL LOW (ref >90)
GFR, EST AFRICAN AMERICAN: 46 mL/min — AB (ref >90)
Glucose, Bld: 94 mg/dL (ref 70–99)
Potassium: 2.9 mEq/L — CL (ref 3.7–5.3)
SODIUM: 144 meq/L (ref 137–147)
TOTAL PROTEIN: 6.2 g/dL (ref 6.0–8.3)
Total Bilirubin: 1.4 mg/dL — ABNORMAL HIGH (ref 0.3–1.2)

## 2014-01-30 LAB — AMMONIA: Ammonia: 105 umol/L — ABNORMAL HIGH (ref 11–60)

## 2014-01-30 MED ORDER — POTASSIUM CHLORIDE 10 MEQ/50ML IV SOLN
10.0000 meq | INTRAVENOUS | Status: AC
Start: 2014-01-30 — End: 2014-01-31
  Administered 2014-01-30 – 2014-01-31 (×5): 10 meq via INTRAVENOUS
  Filled 2014-01-30 (×5): qty 50

## 2014-01-30 MED ORDER — LORAZEPAM 2 MG/ML IJ SOLN
0.5000 mg | Freq: Four times a day (QID) | INTRAMUSCULAR | Status: DC | PRN
  Administered 2014-01-30 – 2014-02-04 (×9): 0.5 mg via INTRAVENOUS
  Filled 2014-01-30 (×9): qty 1

## 2014-01-30 MED ORDER — ALBUTEROL SULFATE (2.5 MG/3ML) 0.083% IN NEBU
2.5000 mg | INHALATION_SOLUTION | Freq: Four times a day (QID) | RESPIRATORY_TRACT | Status: DC | PRN
  Administered 2014-01-30 – 2014-02-05 (×9): 2.5 mg via RESPIRATORY_TRACT
  Filled 2014-01-30 (×9): qty 3

## 2014-01-30 MED ORDER — LACTULOSE ENEMA
300.0000 mL | Freq: Once | ORAL | Status: AC
  Administered 2014-01-30: 300 mL via RECTAL
  Filled 2014-01-30 (×2): qty 300

## 2014-01-30 NOTE — Progress Notes (Signed)
Carolyn [name], unfortunately, is developing liver failure. Her ammonia is incredibly high. It was 3 yesterday. Is now 105. Am not sure as to why she isn't developing the ammonia accumulation but one would have to think that this is somehow related to her malignancy. She was given some lactulose enemas blood am unsure of if they are effective.  She still has some confusion. She still not eating.  Her, pre-albumin is down to 6.6. Her albumin is low at 2.2. Her actual liver function tests are not that bad.  Her hemoglobin up to 10.3 with transfusion. Her potassium is down.  Again, the elevated ammonia is incredibly troublesome to me. I think we have to try to do a CT scan to see what's going on with her liver but I am not sure she would go to tolerate this as she has some confusion and probably would not be able to lie flat.  Her vital signs still show some elevated blood pressure. She is afebrile. Lungs have some decreased at the bases. Cardiac exam tachycardia regular. Abdomen is soft. Bowel sounds are somewhat decreased. She has no obvious ascites. I really cannot palpate her liver. Extremities shows some trace edema in her legs. Neurological exam shows no obvious neurological deficits.  I spoke to her husband at length. I spoke to her her daughter who lives in [place] by phone.  I have a feeling that this is a problem that she just will not be able to get through. I think that if her ammonia continues to elevate, that she will continue to decline.  Even though her CA 19-9 is not elevated, and never has been elevated. We really cannot use that as a measure for response.  Her husband said that she would not want to be kept alive on machines. He told me that she just wants to "go to God". I understand this and I agree with this. I think that if she were to be put on a life support machine, she would not come off. Even if he did come off life support, she would be a vegetable and still would be dealing  with malignancy which would not be treated. As such, she is a NO CODE BLUE  Again, I just surprised by the elevated ammonia level. This, to be, indicates significant hepatic dysfunction.  I don't see anything medicine wise that would be causing this. Again, I have to believe we are looking at her malignancy as the biggest problem.  I think that the next 2 days or so will be critical. If she improves and I think she will improve over the next couple days.  We will continue to follow her along. I very much appreciate the great care she is getting from the hospitalist and the staff on 3 E.

## 2014-01-30 NOTE — Progress Notes (Signed)
Flexiseal removed . Patient having large amount of dark green stool . Appears more comfortable after removal of flexiseal.

## 2014-01-30 NOTE — Progress Notes (Signed)
[facility] PROGRESS NOTE  Carolyn [name] MPN:[id number] DOB: May 26, 1936 DOA: 01/27/2014 PCP: Precious Reel, MD Brief Narrative:   Carolyn [name] is an 78 y.o. female with a PMH of locally advanced pancreatic cancer obstructive jaundice status post biliary stent, currently being treated with single agent gemcitabine, last dose 01/17/14 (dose held 01/24/14 secondary to abnormal labs) who was admitted 01/28/14 with a one-week history of progressive weakness, anorexia, and lethargy. Upon initial evaluation in the ED, patient was noted to have a hemoglobin of 6.6 and a creatinine of 1.54. She has been persistently encephalopathic and ammonia levels are elevated. Her condition has deteriorated. Initial conversation with husband about goals of care done 01/28/14, he would like for the patient to have a CT scan of her abdomen/pelvis to find out if the cancer has gotten worse, in which case, he would favor comfort care. CT scan unable to be performed in the patient's present restless state.  Assessment/Plan: Principal Problem:   Pancreatic cancer with chemotherapy associated anemia and thrombocytopenia and failure to thrive  The patient was admitted and a septic workup undertaken. No evidence of pneumonia on chest x-ray, urinalysis negative for nitrites and leukocytes. Culture pending. Check lactic acid.  Pt is s/p PRBC transfusion Most recent chemotherapy held secondary to thrombocytopenia. Platelet count recovered.  Dr. Marin Olp following.  Active Problems:   Abdominal pain  Patient has ongoing severe pain, and her husband reports that she told him "please let me die " per prior rounding physician notes.  Increase fentanyl patch to 37.5 mg every 3 days.   Severe protein calorie malnutrition / Anorexia  The patient is anorexic and likely cachectic from her underlying malignancy, which appears to be entering the end stages.  Doubt she will eat in her present state. When status improves, will get dietitian  consult.  Elevated LFTs  Has a stent in the common bile duct. Abdominal ultrasound done 01/28/14 showing biliary stent in place, with possible liver metastasis.  Given encephalopathy, and concern for sepsis with known stent in the common bile duct and rising LFTs, she was started on empiric Zosyn 01/28/14.  No improvement in encephalopathy as of yet. Ammonia elevated. Try lactulose.  Dehydration / acute renal failure  Continue to hydrate.  The patient's baseline creatinine is 1.0, current creatinine 1.39 slightly improved with hydration (creatinine 1.54 on admission).  Confusion / metabolic encephalopathy / hyperammonemia  Likely from metabolic encephalopathy. Urinalysis negative for nitrites and leukocytes. Chest x-ray clear. LFTs elevated. No clear source of infection, but with stent in the common bile duct, may have an intra-abdominal infection. Ultrasound done 01/28/14, unrevealing. Continue empiric Zosyn. Ammonia level elevated. Place order for lactulose CT of the head done on admission was negative.  DVT Prophylaxis  Continue SCDs.  Hypokalemia - Replaced IV  Code Status: DNR Family Communication:  Discussed with family member at bedside and spouse Disposition Plan: Pending improvement in condition. But may have to revisit palliation    Consultants:  Oncology  Procedures:  none  Antibiotics: Zosyn  HPI/Subjective: No new complaints. No acute issues reported overnight.  Still having poor oral intake.  Objective: Filed Vitals:   01/30/14 1415  BP: 175/50  Pulse: 84  Temp: 97.9 F (36.6 C)  Resp: 18    Intake/Output Summary (Last 24 hours) at 01/30/14 1849 Last data filed at 01/30/14 1439  Gross per 24 hour  Intake      0 ml  Output    700 ml  Net   -700 ml  Filed Weights   01/27/14 2053  Weight: 82.373 kg (181 lb 9.6 oz)    Exam:   General:  Pt in NAD, alert and awake  Cardiovascular: RRR, no MRG  Respiratory: CTA BL, no wheezes  Abdomen: soft,  NT, ND  Musculoskeletal: no cyanosis or clubbing   Data Reviewed: Basic Metabolic Panel:  Recent Labs Lab 01/24/14 1307 01/27/14 1455 01/28/14 0545 02-13-14 0530 01/30/14 0610  NA 137 134* 138 138 144  K 4.9* 5.2 4.8 4.2 2.9*  CL 97* 98 102 101 106  CO2 22 23 23 22 22   GLUCOSE 149* 88 101* 102* 94  BUN 34* 36* 31* 31* 28*  CREATININE 1.9* 1.54* 1.31* 1.39* 1.28*  CALCIUM 9.4 9.7 9.6 9.4 9.5   Liver Function Tests:  Recent Labs Lab 01/24/14 1307 01/27/14 1455 02-13-14 0530 01/30/14 0610  AST 68* 109* 114* 98*  ALT 28 50* 53* 46*  ALKPHOS 175* 199* 224* 226*  BILITOT 0.60 0.6 1.6* 1.4*  PROT 6.7 6.0 6.1 6.2  ALBUMIN  --  2.0* 2.2* 2.2*   No results found for this basename: LIPASE, AMYLASE,  in the last 168 hours  Recent Labs Lab 02/13/2014 1020 01/30/14 0610  AMMONIA 88* 105*   CBC:  Recent Labs Lab 01/24/14 1307 01/27/14 1455 01/28/14 0545 02-13-14 0530 01/30/14 0610  WBC 5.4 9.0 11.4* 14.2* 14.3*  NEUTROABS 4.8  --   --   --   --   HGB 8.6* 6.6* 8.1* 10.4* 10.3*  HCT 25.7* 19.5* 23.8* 30.6* 30.7*  MCV 86 81.6 84.1 82.9 85.5  PLT 25* 73* 141* 298 458*   Cardiac Enzymes:  Recent Labs Lab 01/27/14 1455  TROPONINI <0.30   BNP (last 3 results)  Recent Labs  01/27/14 1527  PROBNP 1669.0*   CBG:  Recent Labs Lab 01/27/14 1440  GLUCAP 90    Recent Results (from the past 240 hour(s))  URINE CULTURE     Status: None   Collection Time    01/27/14  5:16 PM      Result Value Ref Range Status   Specimen Description URINE, CATHETERIZED   Final   Special Requests NONE   Final   Culture  Setup Time     Final   Value: 01/27/2014 21:40     Performed at [place]     Final   Value: NO GROWTH     Performed at [facility]   Culture     Final   Value: NO GROWTH     Performed at [facility]   Report Status 01/28/2014 FINAL   Final     Studies: US Abdomen Limited Ruq  02-13-2014   CLINICAL DATA:   Elevated LFT  EXAM: US ABDOMEN LIMITED - RIGHT UPPER QUADRANT  COMPARISON:  None.  FINDINGS: Gallbladder:  Previous cholecystectomy  Common bile duct:  Diameter: Stent is identified within the common bile duct which measures up to 10 mm.  Liver:  There are multi focal anechoic liver lesions compatible with liver cysts. Within the right hepatic lobe there is a hypoechoic structure measuring 1.3 x 0.7 cm which is indeterminate.  IMPRESSION: 1. Previous cholecystectomy. 2. Increase caliber of the common bile duct with common bile duct stent in place. 3. Hypoechoic structure within the right hepatic lobe is indeterminate, can't does not have the appearance of a simple cyst. This may represent a focus of liver metastasis.   Electronically Signed   By: Lovena Le  Clovis Riley M.D.   On: 01/29/2014 09:35    Scheduled Meds: . feeding supplement (RESOURCE BREEZE)  1 Container Oral TID BM  . fentaNYL  37.5 mcg Transdermal Q72H  . lactulose  30 g Oral TID  . pantoprazole (PROTONIX) IV  40 mg Intravenous QHS  . piperacillin-tazobactam (ZOSYN)  IV  3.375 g Intravenous 3 times per day  . potassium chloride  10 mEq Intravenous Q1 Hr x 5   Continuous Infusions: . sodium chloride      Principal Problem:   Pancreatic cancer Active Problems:   Protein-calorie malnutrition, severe   Intractable abdominal pain   Anorexia   Dehydration   Confusion   Anemia   Failure to thrive   Encephalopathy, metabolic   Hyperammonemia    Time spent: > 35 minutes    [facility] Pager [phone] If 7PM-7AM, please contact night-coverage at [email or web address], password [facility] 01/30/2014, 6:49 PM  LOS: 3 days

## 2014-01-30 NOTE — Clinical Documentation Improvement (Signed)
Possible Clinical Conditions?   _______CKD Stage I - GFR > OR = 90 _______CKD Stage II - GFR 60-80 _______CKD Stage III - GFR 30-59 _______CKD Stage IV - GFR 15-29 _______CKD Stage V - GFR < 15 _______ESRD (End Stage Renal Disease) _______Other condition_____________ _______Cannot Clinically determine   Supporting Information:  Per 01/27/14 H&P = monitor BUN and clinically AKI on CKD -hold diuretics, hydrate -monitor   Lab Ranges (01/27/14 - 01/29/14) GFR  (37-45) BUN  (31-36) Creatine (1.31 - 1.54)   Thank You, Serena Colonel ,RN Clinical Documentation Specialist:  [facility] [employer]

## 2014-01-31 ENCOUNTER — Inpatient Hospital Stay (HOSPITAL_COMMUNITY): Payer: Medicare Other

## 2014-01-31 ENCOUNTER — Encounter (HOSPITAL_COMMUNITY): Payer: Self-pay | Admitting: Radiology

## 2014-01-31 ENCOUNTER — Ambulatory Visit (HOSPITAL_COMMUNITY): Admission: RE | Admit: 2014-01-31 | Payer: Medicare Other | Source: Ambulatory Visit

## 2014-01-31 LAB — CBC
HCT: 30.2 % — ABNORMAL LOW (ref 36.0–46.0)
HEMOGLOBIN: 10 g/dL — AB (ref 12.0–15.0)
MCH: 28.7 pg (ref 26.0–34.0)
MCHC: 33.1 g/dL (ref 30.0–36.0)
MCV: 86.5 fL (ref 78.0–100.0)
Platelets: 549 10*3/uL — ABNORMAL HIGH (ref 150–400)
RBC: 3.49 MIL/uL — AB (ref 3.87–5.11)
RDW: 20.3 % — ABNORMAL HIGH (ref 11.5–15.5)
WBC: 13.1 10*3/uL — AB (ref 4.0–10.5)

## 2014-01-31 LAB — COMPREHENSIVE METABOLIC PANEL
ALK PHOS: 207 U/L — AB (ref 39–117)
ALT: 41 U/L — AB (ref 0–35)
AST: 89 U/L — ABNORMAL HIGH (ref 0–37)
Albumin: 2.1 g/dL — ABNORMAL LOW (ref 3.5–5.2)
BILIRUBIN TOTAL: 1.2 mg/dL (ref 0.3–1.2)
BUN: 25 mg/dL — ABNORMAL HIGH (ref 6–23)
CHLORIDE: 108 meq/L (ref 96–112)
CO2: 22 meq/L (ref 19–32)
Calcium: 9.5 mg/dL (ref 8.4–10.5)
Creatinine, Ser: 1.23 mg/dL — ABNORMAL HIGH (ref 0.50–1.10)
GFR, EST AFRICAN AMERICAN: 48 mL/min — AB (ref >90)
GFR, EST NON AFRICAN AMERICAN: 42 mL/min — AB (ref >90)
GLUCOSE: 66 mg/dL — AB (ref 70–99)
POTASSIUM: 2.9 meq/L — AB (ref 3.7–5.3)
SODIUM: 145 meq/L (ref 137–147)
Total Protein: 6 g/dL (ref 6.0–8.3)

## 2014-01-31 LAB — GLUCOSE, CAPILLARY
Glucose-Capillary: 64 mg/dL — ABNORMAL LOW (ref 70–99)
Glucose-Capillary: 91 mg/dL (ref 70–99)

## 2014-01-31 LAB — AMMONIA: Ammonia: 58 umol/L (ref 11–60)

## 2014-01-31 LAB — MAGNESIUM: Magnesium: 1.8 mg/dL (ref 1.5–2.5)

## 2014-01-31 MED ORDER — DEXTROSE 50 % IV SOLN
INTRAVENOUS | Status: AC
  Administered 2014-01-31: 25 mL
  Filled 2014-01-31: qty 50

## 2014-01-31 MED ORDER — IOHEXOL 300 MG/ML  SOLN
80.0000 mL | Freq: Once | INTRAMUSCULAR | Status: AC | PRN
  Administered 2014-01-31: 100 mL via INTRAVENOUS

## 2014-01-31 MED ORDER — POTASSIUM CHLORIDE 10 MEQ/100ML IV SOLN
10.0000 meq | INTRAVENOUS | Status: AC
  Administered 2014-01-31 (×6): 10 meq via INTRAVENOUS
  Filled 2014-01-31 (×6): qty 100

## 2014-01-31 MED ORDER — POTASSIUM CHLORIDE 10 MEQ/100ML IV SOLN
10.0000 meq | INTRAVENOUS | Status: AC
  Administered 2014-01-31 (×4): 10 meq via INTRAVENOUS
  Filled 2014-01-31 (×4): qty 100

## 2014-01-31 NOTE — Progress Notes (Signed)
Hypoglycemic Event  CBG: 64  Treatment: D50 IV 25 mL  Symptoms: None and restless  Follow-up CBG: [id number] CBG Result:91  Possible Reasons for Event: Inadequate meal intake  Comments/MD notified:    Brantley Persons  Remember to initiate Hypoglycemia Order Set & complete

## 2014-01-31 NOTE — Progress Notes (Signed)
Surprisingly enough, the CT scan did not show any obvious spread of her pancreatic cancer. She still has a pancreatic head mass. There is no obvious liver metastases. There is no disease in the abdomen.  I still cannot explain why she had the elevated ammonia. Again I don't see anything for her medicines that would be doing this.  Her potassium was still low today. She's not eating. I they we may have to consider a feeding tube to try to help her. Since her cancer does not appear to be spreading or progressing, or chemotherapy is helping.  Her labs are about the same otherwise. Her liver tests seem to be improving slowly. I don't know if she took any extra Tylenol or other hepatotoxic agents. Her blood sugar is on the low side. This would be evidence of hepatic dysfunction.  Her renal function is improving.  Her vital signs were stable. Her blood pressure 149/57. She is afebrile. I find nothing on her physical exam that is new. She still really does not talk much. She is not out of bed. Again she's not eating. Her ammonia has been coming down. It is now 42.  I does wonder if she did not has some kind of exposure to a hepatic toxin that could cause this.  Her daughter came down from [place]. I spoke with her this morning.  Again, given that the CT scan did not show any obvious disease progression and that her ammonia is improving and that her liver tests seem to be improving, I think we need to try to support Carolyn [name] and somehow feel her until she can feed herself. I will talk to the daughter about this tomorrow.  PETE E

## 2014-01-31 NOTE — Progress Notes (Signed)
[facility] PROGRESS NOTE  Carolyn [name] LSL:[id number] DOB: 1935-12-05 DOA: 01/27/2014 PCP: Precious Reel, MD Brief Narrative:   Carolyn [name] is an 78 y.o. female with a PMH of locally advanced pancreatic cancer obstructive jaundice status post biliary stent, currently being treated with single agent gemcitabine, last dose 01/17/14 (dose held 01/24/14 secondary to abnormal labs) who was admitted 01/28/14 with a one-week history of progressive weakness, anorexia, and lethargy. Upon initial evaluation in the ED, patient was noted to have a hemoglobin of 6.6 and a creatinine of 1.54. She has been persistently encephalopathic and ammonia levels are elevated. Her condition has deteriorated. Initial conversation with husband about goals of care done 01/28/14, he would like for the patient to have a CT scan of her abdomen/pelvis to find out if the cancer has gotten worse, in which case, he would favor comfort care. CT scan unable to be performed in the patient's present restless state.  Assessment/Plan: Principal Problem:   Pancreatic cancer with chemotherapy associated anemia and thrombocytopenia and failure to thrive  The patient was admitted and a septic workup undertaken. No evidence of pneumonia on chest x-ray, urinalysis negative for nitrites and leukocytes. Culture pending. Check lactic acid.  Pt is s/p PRBC transfusion Most recent chemotherapy held secondary to thrombocytopenia. Platelet count recovered.  Dr. Marin Olp following. Ct of abdomen and pelvis ordered for today 01/31/14. Disposition to be determined once results available.  Active Problems:   Abdominal pain  Increase fentanyl patch to 37.5 mg every 3 days.  Relatively well controlled.  Severe protein calorie malnutrition / Anorexia  The patient is anorexic and likely cachectic from her underlying malignancy, which appears to be entering the end stages.  Doubt she will eat in her present state. When status improves, will get dietitian  consult.  Elevated LFTs  Has a stent in the common bile duct. Abdominal ultrasound done 01/28/14 showing biliary stent in place, with possible liver metastasis.  Given encephalopathy, and concern for sepsis with known stent in the common bile duct and rising LFTs, she was started on empiric Zosyn 01/28/14.  No improvement in encephalopathy as of yet despite decrease in ammonia levels.  Dehydration / acute renal failure  Continue to hydrate.  The patient's baseline creatinine is 1.0, current creatinine 1.23 slightly improved with hydration (creatinine 1.54 on admission).  Confusion / metabolic encephalopathy / hyperammonemia  Likely from metabolic encephalopathy. Urinalysis negative for nitrites and leukocytes. Chest x-ray clear. LFTs elevated. No clear source of infection, but with stent in the common bile duct, may have an intra-abdominal infection. Ultrasound done 01/28/14, unrevealing. Continue empiric Zosyn. Ammonia level elevated. Place order for lactulose CT of the head done on admission was negative.  DVT Prophylaxis  Continue SCDs.  Hypokalemia - Replace IV  Code Status: DNR Family Communication:  Discussed with family member at bedside and spouse Disposition Plan: Pending results of CT scan and oncologist recommendations.   Consultants:  Oncology  Procedures:  none  Antibiotics: Zosyn  HPI/Subjective: No new complaints. No acute issues reported overnight.  Still having poor oral intake.  Objective: Filed Vitals:   01/31/14 1326  BP: 181/72  Pulse: 81  Temp: 97.2 F (36.2 C)  Resp: 18    Intake/Output Summary (Last 24 hours) at 01/31/14 1548 Last data filed at 01/31/14 0650  Gross per 24 hour  Intake 1593.33 ml  Output    400 ml  Net 1193.33 ml   Filed Weights   01/27/14 2053  Weight: 82.373 kg (181 lb 9.6  oz)    Exam:   General:  Pt in NAD, alert and awake  Cardiovascular: RRR, no MRG  Respiratory: CTA BL, no wheezes  Abdomen: soft, ND,  hypoactive bowel sounds  Musculoskeletal: no cyanosis or clubbing   Data Reviewed: Basic Metabolic Panel:  Recent Labs Lab 01/27/14 1455 01/28/14 0545 01/29/14 0530 01/30/14 0610 01/31/14 0415 01/31/14 0515  NA 134* 138 138 144 145  --   K 5.2 4.8 4.2 2.9* 2.9*  --   CL 98 102 101 106 108  --   CO2 23 23 22 22 22   --   GLUCOSE 88 101* 102* 94 66*  --   BUN 36* 31* 31* 28* 25*  --   CREATININE 1.54* 1.31* 1.39* 1.28* 1.23*  --   CALCIUM 9.7 9.6 9.4 9.5 9.5  --   MG  --   --   --   --   --  1.8   Liver Function Tests:  Recent Labs Lab 01/27/14 1455 01/29/14 0530 01/30/14 0610 01/31/14 0415  AST 109* 114* 98* 89*  ALT 50* 53* 46* 41*  ALKPHOS 199* 224* 226* 207*  BILITOT 0.6 1.6* 1.4* 1.2  PROT 6.0 6.1 6.2 6.0  ALBUMIN 2.0* 2.2* 2.2* 2.1*   No results found for this basename: LIPASE, AMYLASE,  in the last 168 hours  Recent Labs Lab 01/29/14 1020 01/30/14 0610 01/31/14 0410  AMMONIA 88* 105* 58   CBC:  Recent Labs Lab 01/27/14 1455 01/28/14 0545 01/29/14 0530 01/30/14 0610 01/31/14 0415  WBC 9.0 11.4* 14.2* 14.3* 13.1*  HGB 6.6* 8.1* 10.4* 10.3* 10.0*  HCT 19.5* 23.8* 30.6* 30.7* 30.2*  MCV 81.6 84.1 82.9 85.5 86.5  PLT 73* 141* 298 458* 549*   Cardiac Enzymes:  Recent Labs Lab 01/27/14 1455  TROPONINI <0.30   BNP (last 3 results)  Recent Labs  01/27/14 1527  PROBNP 1669.0*   CBG:  Recent Labs Lab 01/27/14 1440 01/31/14 0609 01/31/14 0649  GLUCAP 90 64* 91    Recent Results (from the past 240 hour(s))  URINE CULTURE     Status: None   Collection Time    01/27/14  5:16 PM      Result Value Ref Range Status   Specimen Description URINE, CATHETERIZED   Final   Special Requests NONE   Final   Culture  Setup Time     Final   Value: 01/27/2014 21:40     Performed at [place]     Final   Value: NO GROWTH     Performed at [facility]   Culture     Final   Value: NO GROWTH     Performed at  [facility]   Report Status 01/28/2014 FINAL   Final     Studies: Ct Abdomen Pelvis W Contrast  01/31/2014   CLINICAL DATA:  Pancreatic cancer. Jaundice. Lethargy. Severe abdominal pain.  EXAM: CT ABDOMEN AND PELVIS WITH CONTRAST  TECHNIQUE: Multidetector CT imaging of the abdomen and pelvis was performed using the standard protocol following bolus administration of intravenous contrast.  CONTRAST:  155mL OMNIPAQUE IOHEXOL 300 MG/ML  SOLN  COMPARISON:  11/10/2013  FINDINGS: Lung bases show dependent atelectasis, right greater than left with tiny right pleural effusion.  Multiple well-defined water density lesions scattered throughout the liver parenchyma are stable. These are probably hepatic cysts. Poorly defined area of low attenuation measuring approximately 2.7 x 1.7 cm in the posterior right liver is  not qualitatively changed in the interval. It is difficult to reproducibly measure given the ill-defined margins.  Spleen is unchanged with a tiny low-density anterior lesion. Stomach is unremarkable. Common bile duct stent remains in place. No substantial biliary dilatation. Pneumobilia persists.  Dilatation of the main pancreatic duct has decreased in the interval, measuring 9 mm today compared to 16 mm previously. Pancreatic head remains poorly defined and the mass lesion is difficult to identify given ill-defined margins and adjacent structures. Measuring at the same level as on the previous study, it measures 3.2 x 4.2 cm today compared to 2.9 x 4.3 cm previously.  The adrenal glands are unremarkable. Small cysts are evident in both kidneys  Imaging through the pelvis shows no free intraperitoneal fluid. Foley catheter decompresses the urinary bladder. No pelvic sidewall lymphadenopathy. The uterus is surgically absent. No adnexal mass. Prominent colonic stool noted. The terminal ileum and the appendix are normal.  Diffuse body wall edema is evident.  Bone windows reveal no worrisome lytic or  sclerotic osseous lesions.  IMPRESSION: No substantial interval change in the poorly defined pancreatic head mass. The dilatation of the main pancreatic duct is decreased in the interval. There is no associated biliary duct dilatation with common bile duct stent in stable position.  Multiple hepatic cysts within ill-defined lesion in the posterior right liver without substantial change. This ill-defined lesion may represent metastatic disease and continued attention on follow-up imaging recommended.  Bilateral renal cysts.  Atherosclerosis.   Electronically Signed   By: Misty Stanley M.D.   On: 01/31/2014 14:18    Scheduled Meds: . feeding supplement (RESOURCE BREEZE)  1 Container Oral TID BM  . fentaNYL  37.5 mcg Transdermal Q72H  . pantoprazole (PROTONIX) IV  40 mg Intravenous QHS  . piperacillin-tazobactam (ZOSYN)  IV  3.375 g Intravenous 3 times per day  . potassium chloride  10 mEq Intravenous Q1 Hr x 4   Continuous Infusions: . sodium chloride 100 mL/hr at 01/30/14 2300    Principal Problem:   Pancreatic cancer Active Problems:   Protein-calorie malnutrition, severe   Intractable abdominal pain   Anorexia   Dehydration   Confusion   Anemia   Failure to thrive   Encephalopathy, metabolic   Hyperammonemia    Time spent: > 35 minutes    [facility] Pager [phone] If 7PM-7AM, please contact night-coverage at [email or web address], password [email or web address] 01/31/2014, 3:48 PM  LOS: 4 days

## 2014-01-31 NOTE — Progress Notes (Addendum)
K+ 2.9 today. Paged NP on call. Orders received.

## 2014-02-01 ENCOUNTER — Inpatient Hospital Stay (HOSPITAL_COMMUNITY): Payer: Medicare Other

## 2014-02-01 LAB — BASIC METABOLIC PANEL
BUN: 23 mg/dL (ref 6–23)
CO2: 23 meq/L (ref 19–32)
Calcium: 9.5 mg/dL (ref 8.4–10.5)
Chloride: 109 mEq/L (ref 96–112)
Creatinine, Ser: 1.29 mg/dL — ABNORMAL HIGH (ref 0.50–1.10)
GFR calc non Af Amer: 39 mL/min — ABNORMAL LOW (ref >90)
GFR, EST AFRICAN AMERICAN: 46 mL/min — AB (ref >90)
Glucose, Bld: 85 mg/dL (ref 70–99)
POTASSIUM: 3.5 meq/L — AB (ref 3.7–5.3)
Sodium: 144 mEq/L (ref 137–147)

## 2014-02-01 LAB — AMMONIA: AMMONIA: 104 umol/L — AB (ref 11–60)

## 2014-02-01 LAB — CBC
HEMATOCRIT: 30.9 % — AB (ref 36.0–46.0)
Hemoglobin: 10.1 g/dL — ABNORMAL LOW (ref 12.0–15.0)
MCH: 28.6 pg (ref 26.0–34.0)
MCHC: 32.7 g/dL (ref 30.0–36.0)
MCV: 87.5 fL (ref 78.0–100.0)
Platelets: 598 10*3/uL — ABNORMAL HIGH (ref 150–400)
RBC: 3.53 MIL/uL — ABNORMAL LOW (ref 3.87–5.11)
RDW: 22 % — ABNORMAL HIGH (ref 11.5–15.5)
WBC: 12.6 10*3/uL — ABNORMAL HIGH (ref 4.0–10.5)

## 2014-02-01 LAB — COMPREHENSIVE METABOLIC PANEL
ALT: 35 U/L (ref 0–35)
AST: 76 U/L — ABNORMAL HIGH (ref 0–37)
Albumin: 2.1 g/dL — ABNORMAL LOW (ref 3.5–5.2)
Alkaline Phosphatase: 211 U/L — ABNORMAL HIGH (ref 39–117)
BUN: 24 mg/dL — ABNORMAL HIGH (ref 6–23)
CO2: 22 meq/L (ref 19–32)
Calcium: 9.4 mg/dL (ref 8.4–10.5)
Chloride: 109 mEq/L (ref 96–112)
Creatinine, Ser: 1.28 mg/dL — ABNORMAL HIGH (ref 0.50–1.10)
GFR calc non Af Amer: 40 mL/min — ABNORMAL LOW (ref >90)
GFR, EST AFRICAN AMERICAN: 46 mL/min — AB (ref >90)
GLUCOSE: 98 mg/dL (ref 70–99)
Potassium: 3.6 mEq/L — ABNORMAL LOW (ref 3.7–5.3)
SODIUM: 145 meq/L (ref 137–147)
Total Bilirubin: 1.1 mg/dL (ref 0.3–1.2)
Total Protein: 6.1 g/dL (ref 6.0–8.3)

## 2014-02-01 LAB — MAGNESIUM: MAGNESIUM: 1.8 mg/dL (ref 1.5–2.5)

## 2014-02-01 LAB — PHOSPHORUS: PHOSPHORUS: 2.7 mg/dL (ref 2.3–4.6)

## 2014-02-01 LAB — LACTATE DEHYDROGENASE: LDH: 483 U/L — ABNORMAL HIGH (ref 94–250)

## 2014-02-01 MED ORDER — OSMOLITE 1.2 CAL PO LIQD
1000.0000 mL | ORAL | Status: DC
  Administered 2014-02-01 – 2014-02-04 (×4): 1000 mL

## 2014-02-01 MED ORDER — ATROPINE SULFATE 1 % OP SOLN
2.0000 [drp] | Freq: Three times a day (TID) | OPHTHALMIC | Status: DC
  Administered 2014-02-01 – 2014-02-04 (×12): 2 [drp] via SUBLINGUAL
  Filled 2014-02-01: qty 2

## 2014-02-01 MED ORDER — FENTANYL 12 MCG/HR TD PT72
37.5000 ug | MEDICATED_PATCH | TRANSDERMAL | Status: DC
  Administered 2014-02-01: 37.5 ug via TRANSDERMAL
  Filled 2014-02-01 (×2): qty 1

## 2014-02-01 MED ORDER — LACTULOSE ENEMA
300.0000 mL | Freq: Once | ORAL | Status: DC
  Filled 2014-02-01: qty 300

## 2014-02-01 MED ORDER — GUAIFENESIN-DM 100-10 MG/5ML PO SYRP: 5.0000 mL | ORAL_SOLUTION | ORAL | Status: DC | PRN

## 2014-02-01 NOTE — Progress Notes (Signed)
ANTIBIOTIC CONSULT NOTE - Follow up  Pharmacy Consult for Zosyn Indication: Intra-abdominal Infection  Allergies  Allergen Reactions  . Alka-Seltzer [Aspirin Effervescent] Swelling   Patient Measurements: Height: 5\' 3"  (160 cm) Weight: 181 lb 9.6 oz (82.373 kg) IBW/kg (Calculated) : 52.4  Assessment: 37 yoF admitted 4/25 with altered mental status, N/V in the setting of pancreatic cancer (diagnosed 06/2013) currently receiving chemotherapy (last dose 4/15, held on 4/22 d/t severe thrombocytopenia).  Concern for intra-abdominal infection or sepsis.  Pharmacy has been consulted to dose Zosyn.  Today is D4 antibiotics  Tmax: remains afebrile  WBCs: slowly decreasing, 12.6  Renal: SCr 1.28, stable, CrCl ~ 38 ml/min  4/25 urine culture: no growth, Final  Goal of Therapy:  Appropriate abx dosing, eradication of infection.   Plan:   Continue Zosyn 3.375g IV Q8H infused over 4hrs.  Follow up renal function and length of therapy.  Pharmacy will sign-off from note writing unless dosage change is necessary, but will continue to follow peripherally  Thank you for the consult.  Johny Drilling, PharmD, BCPS Pager: [phone] Pharmacy: [phone] 02/01/2014 6:32 AM

## 2014-02-01 NOTE — Progress Notes (Signed)
[facility] PROGRESS NOTE  Dareen Gutzwiller XHB:[id number] DOB: 03-29-1936 DOA: 01/27/2014 PCP: Precious Reel, MD Brief Narrative:   Carolyn [name] is an 78 y.o. female with a PMH of locally advanced pancreatic cancer obstructive jaundice status post biliary stent, currently being treated with single agent gemcitabine, last dose 01/17/14 (dose held 01/24/14 secondary to abnormal labs) who was admitted 01/28/14 with a one-week history of progressive weakness, anorexia, and lethargy. Upon initial evaluation in the ED, patient was noted to have a hemoglobin of 6.6 and a creatinine of 1.54. She has been persistently encephalopathic and ammonia levels are elevated. Her condition has deteriorated. Initial conversation with husband about goals of care done 01/28/14, he would like for the patient to have a CT scan of her abdomen/pelvis to find out if the cancer has gotten worse, in which case, he would favor comfort care. CT scan unable to be performed in the patient's present restless state.  Assessment/Plan: Principal Problem:   Pancreatic cancer with chemotherapy associated anemia and thrombocytopenia and failure to thrive  The patient was admitted and a septic workup undertaken. No evidence of pneumonia on chest x-ray, urinalysis negative for nitrites and leukocytes. Culture pending. Check lactic acid.  Pt is s/p PRBC transfusion Most recent chemotherapy held secondary to thrombocytopenia. Platelet count recovered.  Dr. Marin Olp following. Plan will be for feeding tube until patient condition improved. Place order for NG tube placement as well as dietitian to manage and start enteral tube feedings  Active Problems:   Abdominal pain  Increase fentanyl patch to 37.5 mg every 3 days.  Relatively well controlled.  Severe protein calorie malnutrition / Anorexia  The patient is anorexic and likely cachectic from her underlying malignancy, which appears to be entering the end stages.  Doubt she will eat in her  present state. When status improves, will get dietitian consult. Patient to obtain NG tube and dietitian to manage feeds   Elevated LFTs  Has a stent in the common bile duct. Abdominal ultrasound done 01/28/14 showing biliary stent in place, with possible liver metastasis.  CT of abdomen reports well defined water density lesions scattered throughout the liver parenchyma which are probably hepatic cyst per radiologist report. There is low attenuation area measuring 2.7 x 1.7 cm in the posterior right liver which is not quantitatively change in the interval. Unclear exact cause of the etiology of raise LFTs, ammonia level has risen again to 104. We'll give lactulose PR as patient has not taken oral intake  Dehydration / acute renal failure  Continue to hydrate.  Resolved. Currently serum creatinine steady at 1.2  Confusion / metabolic encephalopathy / hyperammonemia  Likely from metabolic encephalopathy. Urinalysis negative for nitrites and leukocytes. Chest x-ray clear. LFTs elevated. No clear source of infection, but with stent in the common bile duct, may have an intra-abdominal infection. Ultrasound done 01/28/14, unrevealing. Continue empiric Zosyn. Ammonia level elevated. Place order for lactulose CT of the head done on admission was negative.  DVT Prophylaxis  Continue SCDs.  Hypokalemia - Replaced IV and improved. Only mildly hypokalemic at this point with value of 3.6 on last check - Will reassess next a.m.  Code Status: DNR Family Communication:  Discussed with family member at bedside and spouse Disposition Plan: Patient may require NG tube placement for feeds. Discussed briefly with daughter   Consultants:  Oncology  Procedures:  none  Antibiotics: Zosyn  HPI/Subjective: No new complaints. No acute issues reported overnight.  Still having poor oral intake. Patient seems to be  less alert today  Objective: Filed Vitals:   02/01/14 0445  BP: 151/62  Pulse: 93   Temp: 98.7 F (37.1 C)  Resp: 16    Intake/Output Summary (Last 24 hours) at 02/01/14 1223 Last data filed at 02/01/14 0520  Gross per 24 hour  Intake 2531.65 ml  Output    700 ml  Net 1831.65 ml   Filed Weights   01/27/14 2053  Weight: 82.373 kg (181 lb 9.6 oz)    Exam:   General:  Pt in NAD, alert and awake  Cardiovascular: RRR, no MRG  Respiratory: CTA BL, no wheezes  Abdomen: soft, ND, hypoactive bowel sounds  Musculoskeletal: no cyanosis or clubbing   Data Reviewed: Basic Metabolic Panel:  Recent Labs Lab 01/28/14 0545 01/29/14 0530 01/30/14 0610 01/31/14 0415 01/31/14 0515 02/01/14 0430  NA 138 138 144 145  --  145  K 4.8 4.2 2.9* 2.9*  --  3.6*  CL 102 101 106 108  --  109  CO2 23 22 22 22   --  22  GLUCOSE 101* 102* 94 66*  --  98  BUN 31* 31* 28* 25*  --  24*  CREATININE 1.31* 1.39* 1.28* 1.23*  --  1.28*  CALCIUM 9.6 9.4 9.5 9.5  --  9.4  MG  --   --   --   --  1.8  --    Liver Function Tests:  Recent Labs Lab 01/27/14 1455 01/29/14 0530 01/30/14 0610 01/31/14 0415 02/01/14 0430  AST 109* 114* 98* 89* 76*  ALT 50* 53* 46* 41* 35  ALKPHOS 199* 224* 226* 207* 211*  BILITOT 0.6 1.6* 1.4* 1.2 1.1  PROT 6.0 6.1 6.2 6.0 6.1  ALBUMIN 2.0* 2.2* 2.2* 2.1* 2.1*   No results found for this basename: LIPASE, AMYLASE,  in the last 168 hours  Recent Labs Lab 01/29/14 1020 01/30/14 0610 01/31/14 0410 02/01/14 0430  AMMONIA 88* 105* 58 104*   CBC:  Recent Labs Lab 01/28/14 0545 01/29/14 0530 01/30/14 0610 01/31/14 0415 02/01/14 0430  WBC 11.4* 14.2* 14.3* 13.1* 12.6*  HGB 8.1* 10.4* 10.3* 10.0* 10.1*  HCT 23.8* 30.6* 30.7* 30.2* 30.9*  MCV 84.1 82.9 85.5 86.5 87.5  PLT 141* 298 458* 549* 598*   Cardiac Enzymes:  Recent Labs Lab 01/27/14 1455  TROPONINI <0.30   BNP (last 3 results)  Recent Labs  01/27/14 1527  PROBNP 1669.0*   CBG:  Recent Labs Lab 01/27/14 1440 01/31/14 0609 01/31/14 0649  GLUCAP 90 64* 91     Recent Results (from the past 240 hour(s))  URINE CULTURE     Status: None   Collection Time    01/27/14  5:16 PM      Result Value Ref Range Status   Specimen Description URINE, CATHETERIZED   Final   Special Requests NONE   Final   Culture  Setup Time     Final   Value: 01/27/2014 21:40     Performed at [facility]     Final   Value: NO GROWTH     Performed at [facility]   Culture     Final   Value: NO GROWTH     Performed at [facility]   Report Status 01/28/2014 FINAL   Final     Studies: Ct Abdomen Pelvis W Contrast  01/31/2014   CLINICAL DATA:  Pancreatic cancer. Jaundice. Lethargy. Severe abdominal pain.  EXAM: CT ABDOMEN AND PELVIS WITH  CONTRAST  TECHNIQUE: Multidetector CT imaging of the abdomen and pelvis was performed using the standard protocol following bolus administration of intravenous contrast.  CONTRAST:  165mL OMNIPAQUE IOHEXOL 300 MG/ML  SOLN  COMPARISON:  11/10/2013  FINDINGS: Lung bases show dependent atelectasis, right greater than left with tiny right pleural effusion.  Multiple well-defined water density lesions scattered throughout the liver parenchyma are stable. These are probably hepatic cysts. Poorly defined area of low attenuation measuring approximately 2.7 x 1.7 cm in the posterior right liver is not qualitatively changed in the interval. It is difficult to reproducibly measure given the ill-defined margins.  Spleen is unchanged with a tiny low-density anterior lesion. Stomach is unremarkable. Common bile duct stent remains in place. No substantial biliary dilatation. Pneumobilia persists.  Dilatation of the main pancreatic duct has decreased in the interval, measuring 9 mm today compared to 16 mm previously. Pancreatic head remains poorly defined and the mass lesion is difficult to identify given ill-defined margins and adjacent structures. Measuring at the same level as on the previous study, it measures 3.2 x  4.2 cm today compared to 2.9 x 4.3 cm previously.  The adrenal glands are unremarkable. Small cysts are evident in both kidneys  Imaging through the pelvis shows no free intraperitoneal fluid. Foley catheter decompresses the urinary bladder. No pelvic sidewall lymphadenopathy. The uterus is surgically absent. No adnexal mass. Prominent colonic stool noted. The terminal ileum and the appendix are normal.  Diffuse body wall edema is evident.  Bone windows reveal no worrisome lytic or sclerotic osseous lesions.  IMPRESSION: No substantial interval change in the poorly defined pancreatic head mass. The dilatation of the main pancreatic duct is decreased in the interval. There is no associated biliary duct dilatation with common bile duct stent in stable position.  Multiple hepatic cysts within ill-defined lesion in the posterior right liver without substantial change. This ill-defined lesion may represent metastatic disease and continued attention on follow-up imaging recommended.  Bilateral renal cysts.  Atherosclerosis.   Electronically Signed   By: Misty Stanley M.D.   On: 01/31/2014 14:18    Scheduled Meds: . atropine  2 drop Sublingual TID  . feeding supplement (RESOURCE BREEZE)  1 Container Oral TID BM  . pantoprazole (PROTONIX) IV  40 mg Intravenous QHS  . piperacillin-tazobactam (ZOSYN)  IV  3.375 g Intravenous 3 times per day   Continuous Infusions: . sodium chloride 100 mL/hr at 02/01/14 0430    Principal Problem:   Pancreatic cancer Active Problems:   Protein-calorie malnutrition, severe   Intractable abdominal pain   Anorexia   Dehydration   Confusion   Anemia   Failure to thrive   Encephalopathy, metabolic   Hyperammonemia    Time spent: > 35 minutes    [facility] Pager [phone] If 7PM-7AM, please contact night-coverage at [email or web address], password [facility] 02/01/2014, 12:23 PM  LOS: 5 days

## 2014-02-01 NOTE — Progress Notes (Signed)
INITIAL NUTRITION ASSESSMENT  DOCUMENTATION CODES Per approved criteria  -Severe malnutrition in the context of chronic illness -Obesity Unspecified  Pt meets criteria for severe MALNUTRITION in the context of chronic illness as evidenced by 8% weight loss in one month, PO intake <75% for > 7 days .   INTERVENTION: -Initiate Osmolite 1.2 @ 10 ml/hr via NGT and increase by 10 ml every 8 hours to goal rate of 65 ml/hr. At goal rate, tube feeding regimen will provide 1872 kcal, 86 grams of protein, and 1251 ml of H2O.  -Recommend refeeding labs 48 hours d/t prolonged period of sub-optimal nutrition -Will continue to monitor  NUTRITION DIAGNOSIS: Inadequate oral intake related to abd pain/decreased altertness as evidenced by PO intake <75% > 7 days.   Goal: TF to meet >/= 90% of their estimated nutrition needs    Monitor:  TF tolerance, total protein/energy intake, labs, weights  Reason for Assessment: TF Consult  78 y.o. female  Admitting Dx: Pancreatic cancer  ASSESSMENT: Carolyn [name] is a 78 y.o. female with PMH of locally advanced pancreatic CA, biliary stent, was started on Hospice care in 12/14 following this she had some improvement this resulted in Resumption of chemo and code status changed to Full Code, her last chemo was early April per family. Her family noticed that this last week, shes been getting weaker and weaker, eating less and more tired and lethargic at times. In ER noted to have drop in Hb to 6.6 from 8s, no hematemesis/melena or hematochezia, AKI on CKD. Her PCP was called to admit her per EDP, they declined and hence [facility] consulted for further management.  -Family reported pt with minimal PO intake since 4/21, will consume one Resource Breeze/day, 1-2 bites of applesauce, and some water. Noted feelings of abd pain and lethargy that contributed to poor appetite -Prior to that, pt was eating very well. Consumed 3 meals, one-two [employer], and a snack daily.  Was able to be social and attend family outings at restaurants w/out difficulty. However, pt also experienced appetite difficulties per previous RD assessment in 09/2014 -Was able to regain weight to 190 lbs resume chemo in 11/2013. Weigh records indicate pt has lost 9 lbs in <one month -Consulted for TF initiation via NGT. RN expressed concern that pt may pull out tube as she becomes agitated with increased alertness, and has been attempting to pull out catheter. RN was still in agreement that pt would benefit from trialing tube feeding at this time -Low K. Mg WNL. Recommend to monitor refeeding labs with the initiation of TF as pt with minimal nutrition for > 7 days  -Elevated LFT, MD attributes this to liver mets -Elevated ammonia- received lactulose enema  Height: Ht Readings from Last 1 Encounters:  01/27/14 5\' 3"  (1.6 m)    Weight: Wt Readings from Last 1 Encounters:  01/27/14 181 lb 9.6 oz (82.373 kg)    Ideal Body Weight: 115 lbs  % Ideal Body Weight: 157%  Wt Readings from Last 10 Encounters:  01/27/14 181 lb 9.6 oz (82.373 kg)  01/10/14 190 lb (86.183 kg)  12/13/13 183 lb (83.008 kg)  11/22/13 179 lb (81.194 kg)  11/10/13 178 lb (80.74 kg)  10/13/13 196 lb (88.905 kg)  09/11/13 182 lb (82.555 kg)  09/05/13 183 lb 15.9 oz (83.46 kg)  08/29/13 184 lb (83.462 kg)  08/25/13 185 lb 9.6 oz (84.188 kg)    Usual Body Weight: 190 lbs  % Usual Body Weight: 95%  BMI:  Body mass index is 32.18 kg/(m^2).  Estimated Nutritional Needs: Kcal: 1600-1800 Protein: 80-90 gram Fluid: >/=1800 ml/daily  Skin: non pitting edema RLE and LLE  Diet Order: General  EDUCATION NEEDS: -No education needs identified at this time   Intake/Output Summary (Last 24 hours) at 02/01/14 1333 Last data filed at 02/01/14 0520  Gross per 24 hour  Intake 2531.65 ml  Output    700 ml  Net 1831.65 ml    Last BM: 4/29   Labs:   Recent Labs Lab 01/30/14 0610 01/31/14 0415  01/31/14 0515 02/01/14 0430  NA 144 145  --  145  K 2.9* 2.9*  --  3.6*  CL 106 108  --  109  CO2 22 22  --  22  BUN 28* 25*  --  24*  CREATININE 1.28* 1.23*  --  1.28*  CALCIUM 9.5 9.5  --  9.4  MG  --   --  1.8  --   GLUCOSE 94 66*  --  98    CBG (last 3)   Recent Labs  01/31/14 0609 01/31/14 0649  GLUCAP 64* 91    Scheduled Meds: . atropine  2 drop Sublingual TID  . feeding supplement (RESOURCE BREEZE)  1 Container Oral TID BM  . lactulose  300 mL Rectal Once  . pantoprazole (PROTONIX) IV  40 mg Intravenous QHS  . piperacillin-tazobactam (ZOSYN)  IV  3.375 g Intravenous 3 times per day    Continuous Infusions: . sodium chloride 100 mL/hr at 02/01/14 0430    Past Medical History  Diagnosis Date  . Hypertension   . Hyperlipidemia   . GERD (gastroesophageal reflux disease)   . Asthma   . Obstructive jaundice 06/2013  . Pancreatitis   . Arthritis   . Heart murmur   . Thyroid disease   . Cancer   . Pancreatic cancer 07/05/13    pancreatic head=adenocarcinoma    Past Surgical History  Procedure Laterality Date  . Neck surgery  2005/2007  . Hand surgery  2010  . Cataract extraction    . Abdominal hysterectomy    . Eus N/A 07/05/2013    Procedure: ESOPHAGEAL ENDOSCOPIC ULTRASOUND (EUS) RADIAL;  Surgeon: Arta Silence, MD;  Location: [facility];  Service: Endoscopy;  Laterality: N/A;  . Ercp N/A 07/05/2013    Procedure: ENDOSCOPIC RETROGRADE CHOLANGIOPANCREATOGRAPHY (ERCP);  Surgeon: Arta Silence, MD;  Location: [name];  Service: Endoscopy;  Laterality: N/A;  . Buttock cyst removal    . Cholecystectomy      Atlee Abide MS RD LDN Clinical Dietitian [phone]

## 2014-02-02 LAB — COMPREHENSIVE METABOLIC PANEL
ALK PHOS: 198 U/L — AB (ref 39–117)
ALT: 30 U/L (ref 0–35)
AST: 61 U/L — AB (ref 0–37)
Albumin: 1.9 g/dL — ABNORMAL LOW (ref 3.5–5.2)
BILIRUBIN TOTAL: 1.1 mg/dL (ref 0.3–1.2)
BUN: 24 mg/dL — ABNORMAL HIGH (ref 6–23)
CHLORIDE: 109 meq/L (ref 96–112)
CO2: 23 meq/L (ref 19–32)
Calcium: 9.2 mg/dL (ref 8.4–10.5)
Creatinine, Ser: 1.33 mg/dL — ABNORMAL HIGH (ref 0.50–1.10)
GFR, EST AFRICAN AMERICAN: 44 mL/min — AB (ref >90)
GFR, EST NON AFRICAN AMERICAN: 38 mL/min — AB (ref >90)
GLUCOSE: 102 mg/dL — AB (ref 70–99)
Potassium: 3.2 mEq/L — ABNORMAL LOW (ref 3.7–5.3)
SODIUM: 144 meq/L (ref 137–147)
Total Protein: 5.8 g/dL — ABNORMAL LOW (ref 6.0–8.3)

## 2014-02-02 LAB — GLUCOSE, CAPILLARY
GLUCOSE-CAPILLARY: 109 mg/dL — AB (ref 70–99)
GLUCOSE-CAPILLARY: 120 mg/dL — AB (ref 70–99)
GLUCOSE-CAPILLARY: 125 mg/dL — AB (ref 70–99)
Glucose-Capillary: 127 mg/dL — ABNORMAL HIGH (ref 70–99)
Glucose-Capillary: 134 mg/dL — ABNORMAL HIGH (ref 70–99)
Glucose-Capillary: 73 mg/dL (ref 70–99)
Glucose-Capillary: 75 mg/dL (ref 70–99)
Glucose-Capillary: 96 mg/dL (ref 70–99)

## 2014-02-02 LAB — CBC
HCT: 30.2 % — ABNORMAL LOW (ref 36.0–46.0)
Hemoglobin: 10 g/dL — ABNORMAL LOW (ref 12.0–15.0)
MCH: 28.7 pg (ref 26.0–34.0)
MCHC: 33.1 g/dL (ref 30.0–36.0)
MCV: 86.5 fL (ref 78.0–100.0)
Platelets: 626 10*3/uL — ABNORMAL HIGH (ref 150–400)
RBC: 3.49 MIL/uL — ABNORMAL LOW (ref 3.87–5.11)
RDW: 23.4 % — AB (ref 11.5–15.5)
WBC: 11.5 10*3/uL — ABNORMAL HIGH (ref 4.0–10.5)

## 2014-02-02 LAB — AMMONIA: AMMONIA: 121 umol/L — AB (ref 11–60)

## 2014-02-02 LAB — LACTATE DEHYDROGENASE: LDH: 414 U/L — AB (ref 94–250)

## 2014-02-02 LAB — TSH: TSH: 0.174 u[IU]/mL — AB (ref 0.350–4.500)

## 2014-02-02 MED ORDER — RIFAXIMIN 550 MG PO TABS
550.0000 mg | ORAL_TABLET | Freq: Three times a day (TID) | ORAL | Status: DC
  Administered 2014-02-02 – 2014-02-04 (×6): 550 mg via ORAL
  Filled 2014-02-02 (×12): qty 1

## 2014-02-02 MED ORDER — SODIUM CHLORIDE 0.9 % IV SOLN
INTRAVENOUS | Status: DC
  Administered 2014-02-02 – 2014-02-05 (×3): via INTRAVENOUS

## 2014-02-02 MED ORDER — POTASSIUM CHLORIDE 10 MEQ/100ML IV SOLN
10.0000 meq | INTRAVENOUS | Status: AC
  Administered 2014-02-02 (×2): 10 meq via INTRAVENOUS
  Filled 2014-02-02 (×2): qty 100

## 2014-02-02 MED ORDER — DEXTROSE-NACL 5-0.45 % IV SOLN
INTRAVENOUS | Status: DC
  Administered 2014-02-02: 02:00:00 via INTRAVENOUS

## 2014-02-02 MED ORDER — LACTULOSE 10 GM/15ML PO SOLN
20.0000 g | Freq: Two times a day (BID) | ORAL | Status: DC
  Administered 2014-02-02 – 2014-02-04 (×5): 20 g
  Filled 2014-02-02 (×7): qty 30

## 2014-02-02 NOTE — Progress Notes (Signed)
[facility] PROGRESS NOTE  Carolyn [name] SWH:[id number] DOB: March 19, 1936 DOA: 01/27/2014 PCP: Precious Reel, MD Brief Narrative:   Carolyn [name] is an 78 y.o. female with a PMH of locally advanced pancreatic cancer obstructive jaundice status post biliary stent, currently being treated with single agent gemcitabine, last dose 01/17/14 (dose held 01/24/14 secondary to abnormal labs) who was admitted 01/28/14 with a one-week history of progressive weakness, anorexia, and lethargy. Upon initial evaluation in the ED, patient was noted to have a hemoglobin of 6.6 and a creatinine of 1.54. She has been persistently encephalopathic and ammonia levels are elevated. Her condition has deteriorated. Initial conversation with husband about goals of care done 01/28/14, he would like for the patient to have a CT scan of her abdomen/pelvis to find out if the cancer has gotten worse.   Assessment/Plan: Principal Problem:   Pancreatic cancer with chemotherapy associated anemia and thrombocytopenia and failure to thrive  The patient was admitted and a septic workup undertaken. No evidence of pneumonia on chest x-ray, urinalysis negative for nitrites and leukocytes. Culture pending. Check lactic acid.  Pt is s/p PRBC transfusion Most recent chemotherapy held secondary to thrombocytopenia. Platelet count recovered.  Dr. Marin Olp following. Plan will be for feeding tube until patient condition improved. NG tube in place and tube feeds per dietitian.  Active Problems:   Abdominal pain  Control with morphine prn and discontinue Fentanyl patch given patient's hypersomnolence.  Severe protein calorie malnutrition / Anorexia  The patient is anorexic and likely cachectic from her underlying malignancy, which appears to be entering the end stages.  Doubt she will eat in her present state. When status improves, will get dietitian consult. NG tube and dietitian to manage feeds   Elevated LFTs  Has a stent in the common  bile duct. Abdominal ultrasound done 01/28/14 showing biliary stent in place, with possible liver metastasis.  CT of abdomen reports well defined water density lesions scattered throughout the liver parenchyma which are probably hepatic cyst per radiologist report. There is low attenuation area measuring 2.7 x 1.7 cm in the posterior right liver which is not quantitatively change in the interval. Unclear exact cause of the etiology of raise LFTs, ammonia level elevated. Now that patient has NG tube in place agree with changing lactulose to be administered via NG tube.   Dehydration / acute renal failure  Continue to hydrate.  Resolved. Currently serum creatinine steady at 1.3 and steady  Confusion / metabolic encephalopathy / hyperammonemia  Likely from metabolic encephalopathy or pain medication or combination of both. Urinalysis negative for nitrites and leukocytes. Chest x-ray clear. LFTs elevated. No clear source of infection, but with stent in the common bile duct, may have an intra-abdominal infection. Ultrasound done 01/28/14, unrevealing. Continue empiric Zosyn. Ammonia level elevated. Lactulose on board as well as rifaximine  CT of the head done on admission was negative.  DVT Prophylaxis  Continue SCDs.  Hypokalemia - Replaced IV and reassess  Code Status: DNR Family Communication:  Discussed with family member at bedside and spouse Disposition Plan: Patient may require NG tube placement for feeds. Discussed briefly with daughter   Consultants:  Oncology  Procedures:  none  Antibiotics: Zosyn  HPI/Subjective: No new complaints. No acute issues reported overnight. Patient seems to be less alert today. NG tube in place  Objective: Filed Vitals:   02/02/14 1409  BP: 174/76  Pulse: 87  Temp: 98.3 F (36.8 C)  Resp: 16    Intake/Output Summary (Last 24 hours) at  02/02/14 1758 Last data filed at 02/02/14 1737  Gross per 24 hour  Intake      0 ml  Output    900 ml   Net   -900 ml   Filed Weights   01/27/14 2053  Weight: 82.373 kg (181 lb 9.6 oz)    Exam:   General:  Pt in NAD, alert and awake  Cardiovascular: RRR, no MRG  Respiratory: CTA BL, no wheezes  Abdomen: soft, ND, hypoactive bowel sounds  Musculoskeletal: no cyanosis or clubbing   Data Reviewed: Basic Metabolic Panel:  Recent Labs Lab 01/30/14 0610 01/31/14 0415 01/31/14 0515 02/01/14 0430 02/01/14 1835 02/02/14 0541  NA 144 145  --  145 144 144  K 2.9* 2.9*  --  3.6* 3.5* 3.2*  CL 106 108  --  109 109 109  CO2 22 22  --  22 23 23   GLUCOSE 94 66*  --  98 85 102*  BUN 28* 25*  --  24* 23 24*  CREATININE 1.28* 1.23*  --  1.28* 1.29* 1.33*  CALCIUM 9.5 9.5  --  9.4 9.5 9.2  MG  --   --  1.8  --  1.8  --   PHOS  --   --   --   --  2.7  --    Liver Function Tests:  Recent Labs Lab 01/29/14 0530 01/30/14 0610 01/31/14 0415 02/01/14 0430 02/02/14 0541  AST 114* 98* 89* 76* 61*  ALT 53* 46* 41* 35 30  ALKPHOS 224* 226* 207* 211* 198*  BILITOT 1.6* 1.4* 1.2 1.1 1.1  PROT 6.1 6.2 6.0 6.1 5.8*  ALBUMIN 2.2* 2.2* 2.1* 2.1* 1.9*   No results found for this basename: LIPASE, AMYLASE,  in the last 168 hours  Recent Labs Lab 01/29/14 1020 01/30/14 0610 01/31/14 0410 02/01/14 0430 02/02/14 0539  AMMONIA 88* 105* 58 104* 121*   CBC:  Recent Labs Lab 01/29/14 0530 01/30/14 0610 01/31/14 0415 02/01/14 0430 02/02/14 0541  WBC 14.2* 14.3* 13.1* 12.6* 11.5*  HGB 10.4* 10.3* 10.0* 10.1* 10.0*  HCT 30.6* 30.7* 30.2* 30.9* 30.2*  MCV 82.9 85.5 86.5 87.5 86.5  PLT 298 458* 549* 598* 626*   Cardiac Enzymes:  Recent Labs Lab 01/27/14 1455  TROPONINI <0.30   BNP (last 3 results)  Recent Labs  01/27/14 1527  PROBNP 1669.0*   CBG:  Recent Labs Lab 02/01/14 2358 02/02/14 0100 02/02/14 0402 02/02/14 0817 02/02/14 1212  GLUCAP 75 73 96 109* 120*    Recent Results (from the past 240 hour(s))  URINE CULTURE     Status: None   Collection Time     01/27/14  5:16 PM      Result Value Ref Range Status   Specimen Description URINE, CATHETERIZED   Final   Special Requests NONE   Final   Culture  Setup Time     Final   Value: 01/27/2014 21:40     Performed at [facility] Count     Final   Value: NO GROWTH     Performed at [facility]   Culture     Final   Value: NO GROWTH     Performed at [facility]   Report Status 01/28/2014 FINAL   Final     Studies: Dg Abd 1 View  02/01/2014   CLINICAL DATA:  NG tube replacement.  EXAM: ABDOMEN - 1 VIEW 9:31 p.m.  COMPARISON:  02/01/2014 at 7:26 p.m.  FINDINGS: NG tube has been advanced and the tip is now in the body of the stomach.  Bowel gas pattern is normal. Biliary stent is noted. Power port in place in the superior vena cava.  IMPRESSION: NG tube tip is now in the body of the stomach.  No other change.   Electronically Signed   By: Rozetta Nunnery M.D.   On: 02/01/2014 21:49   Dg Abd 1 View  02/01/2014   CLINICAL DATA:  Evaluate nasogastric tube placement.  EXAM: ABDOMEN - 1 VIEW  COMPARISON:  02/01/2014.  FINDINGS: Two views of the abdomen demonstrate a nasogastric tube in position with tip at the level of the gastroesophageal junction. Diffuse dilatation of bowel is noted, however, there is extensive patient motion which limits assessment, making discrimination between small bowel and colon challenging. There is clearly distal colonic and rectal gas, suggesting that this bowel dilatation is likely related to ileus, rather than obstruction. A biliary stent is noted.  IMPRESSION: 1. Tip of nasogastric tube is at the gastroesophageal junction, and could be advanced 10-15 cm for more optimal placement. 2. Additional findings, as above, similar to recent prior examinations.   Electronically Signed   By: Vinnie Langton M.D.   On: 02/01/2014 19:50   Dg Abd 1 View  02/01/2014   CLINICAL DATA:  NG tube assessment  EXAM: ABDOMEN - 1 VIEW  COMPARISON:  CT 01/31/2014.   FINDINGS: NG tube noted with tip at the gastroesophageal junction, more distal placement should be considered. Biliary stent noted. Mild prominence of the small bowel and colonic gas pattern, mild ileus cannot be excluded. No free air. Aortoiliac atherosclerotic vascular disease. 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  IMPRESSION: NG tube noted with tip at the gastroesophageal junction. More distal placement should be considered .   Electronically Signed   By: Marcello Moores  Register   On: 02/01/2014 15:21    Scheduled Meds: . atropine  2 drop Sublingual TID  . feeding supplement (RESOURCE BREEZE)  1 Container Oral TID BM  . lactulose  20 g Per Tube BID  . lactulose  300 mL Rectal Once  . pantoprazole (PROTONIX) IV  40 mg Intravenous QHS  . piperacillin-tazobactam (ZOSYN)  IV  3.375 g Intravenous 3 times per day  . rifaximin  550 mg Oral TID   Continuous Infusions: . sodium chloride 50 mL/hr at 02/02/14 1025  . feeding supplement (OSMOLITE 1.2 CAL) 1,000 mL (02/02/14 1300)    Principal Problem:   Pancreatic cancer Active Problems:   Protein-calorie malnutrition, severe   Intractable abdominal pain   Anorexia   Dehydration   Confusion   Anemia   Failure to thrive   Encephalopathy, metabolic   Hyperammonemia    Time spent: > 35 minutes    [place] [facility] Pager [phone] If 7PM-7AM, please contact night-coverage at [email or web address], password [facility] 02/02/2014, 5:58 PM  LOS: 6 days

## 2014-02-02 NOTE — Progress Notes (Signed)
NUTRITION FOLLOW UP  Intervention:   -Continue w/Osmolite 1.2 @ 10 ml/hr via NGT and increase by 10 ml every 4 hours to goal rate of 65 ml/hr. At goal rate, tube feeding regimen will provide 1872 kcal, 86 grams of protein, and 1251 ml of H2O.  -Resource Breeze TID as tolerated -Will continue to monitor  Nutrition Dx:   Inadequate oral intake related to abd pain/decreased altertness as evidenced by PO intake <75% > 7 days   Goal:   TF to meet >/= 90% of their estimated nutrition needs    Monitor:   TF tolerance, total protein/energy intake, labs, weights   Assessment:   4/30: Carolyn [name] is a 78 y.o. female with PMH of locally advanced pancreatic CA, biliary stent, was started on Hospice care in 12/14 following this she had some improvement this resulted in Resumption of chemo and code status changed to Full Code, her last chemo was early April per family. Her family noticed that this last week, shes been getting weaker and weaker, eating less and more tired and lethargic at times. In ER noted to have drop in Hb to 6.6 from 8s, no hematemesis/melena or hematochezia, AKI on CKD. Her PCP was called to admit her per EDP, they declined and hence [facility] consulted for further management.  -Family reported pt with minimal PO intake since 4/21, will consume one Resource Breeze/day, 1-2 bites of applesauce, and some water. Noted feelings of abd pain and lethargy that contributed to poor appetite  -Prior to that, pt was eating very well. Consumed 3 meals, one-two [employer], and a snack daily. Was able to be social and attend family outings at restaurants w/out difficulty. However, pt also experienced appetite difficulties per previous RD assessment in 09/2014  -Was able to regain weight to 190 lbs resume chemo in 11/2013. Weigh records indicate pt has lost 9 lbs in <one month  -Consulted for TF initiation via NGT. RN expressed concern that pt may pull out tube as she becomes agitated with increased  alertness, and has been attempting to pull out catheter. RN was still in agreement that pt would benefit from trialing tube feeding at this time  -Low K. Mg WNL. Recommend to monitor refeeding labs with the initiation of TF as pt with minimal nutrition for > 7 days  -Elevated LFT, MD attributes this to liver mets  -Elevated ammonia- received lactulose enema    5/01: -Phos/Mg WNL.  -Tolerating Osmolite 1.2 at 20 ml/hr, w/out residuals nausea/vomitng. Witnessed RN advancing to 30 ml/hr -Recommend to increase advancement rate to 10 ml Q4H as refeeding labs WNL and is tolerating TF w/out difficulty -Had one episode of slightly low CBGs overnight, Increased fluids to 20 ml/hr per RN documentation -Has been lethargic all day, no PO intake -RN noted pt was able to receive lactulose via NGT   Height: Ht Readings from Last 1 Encounters:  01/27/14 5\' 3"  (1.6 m)    Weight Status:   Wt Readings from Last 1 Encounters:  01/27/14 181 lb 9.6 oz (82.373 kg)    Re-estimated needs:  Kcal: 1600-1800  Protein: 80-90 gram  Fluid: >/=1800 ml/daily   Skin: +2 edema in RLE and LLE, +1 edema in RUE and LUE  Diet Order: General   Intake/Output Summary (Last 24 hours) at 02/02/14 1403 Last data filed at 02/02/14 0422  Gross per 24 hour  Intake      0 ml  Output    600 ml  Net   -600  ml    Last BM: 5/01   Labs:   Recent Labs Lab 01/31/14 0415 01/31/14 0515 02/01/14 0430 02/01/14 1835 02/02/14 0541  NA 145  --  145 144 144  K 2.9*  --  3.6* 3.5* 3.2*  CL 108  --  109 109 109  CO2 22  --  22 23 23   BUN 25*  --  24* 23 24*  CREATININE 1.23*  --  1.28* 1.29* 1.33*  CALCIUM 9.5  --  9.4 9.5 9.2  MG  --  1.8  --  1.8  --   PHOS  --   --   --  2.7  --   GLUCOSE 66*  --  98 85 102*    CBG (last 3)   Recent Labs  02/02/14 0402 02/02/14 0817 02/02/14 1212  GLUCAP 96 109* 120*    Scheduled Meds: . atropine  2 drop Sublingual TID  . feeding supplement (RESOURCE BREEZE)  1  Container Oral TID BM  . lactulose  20 g Per Tube BID  . lactulose  300 mL Rectal Once  . pantoprazole (PROTONIX) IV  40 mg Intravenous QHS  . piperacillin-tazobactam (ZOSYN)  IV  3.375 g Intravenous 3 times per day  . rifaximin  550 mg Oral TID    Continuous Infusions: . sodium chloride 50 mL/hr at 02/02/14 1025  . feeding supplement (OSMOLITE 1.2 CAL) 1,000 mL (02/02/14 1300)    [place] LDN Clinical Dietitian [phone]

## 2014-02-02 NOTE — Progress Notes (Signed)
Mrs. Else now has a feeding tube in. She is on Osmolite 1.2. Hopefully, she will improve with this. If not,  I cannot think of anything else to try to help. Her ammonia is elevated. He was 121. We can put lactulose down the tube. I also will try rifaximin. We'll see if this can help.  Although the CT scan did not show any obvious hepatic metastasis, I have to believe that she has this.  She is to be most of the time. I would don't think we need to put on the fentanyl patch. We did give her morphine as needed if she does become agitated.  She's had no fever. Blood pressure is okay.  Her total bilirubin was only 1.1. LDH is 414. Albumin is 1.9. Her creatinine is 1.33. Potassium 3.2. Her hemoglobin is 10. Platelet count 626.  I still cannot find anything on her physical exam that is a focal that would indicate why she has this elevated ammonia.  I told her family that week will give her through the weekend with the tube feeds. If she does not improve, then I think we have to discontinue the tube feeds and to a strict comfort care. They agree.  We are given her every possible chance now. Again, I cannot think of anything else that we can do for her.  I very much appreciate the an outstanding care that she is getting from the hospitalist and from the staff on [address].   Pete E.  2 Corinthians 12:9-10

## 2014-02-02 NOTE — Progress Notes (Signed)
Pt started osmolite 1.2 at 10cc/hr lastnight at 10 p.m  Via NG tube and tolerated first 8 hrs well with no signs of nausea , vomiting or discomfort. At 12 midnighht cbg was 75 notified MD and pt fluids was changed. Monitored cbg closely. This a.m was at 102.   Increased from 10 cc to 20 cc/hr. Made MD aware.

## 2014-02-03 LAB — COMPREHENSIVE METABOLIC PANEL
ALBUMIN: 1.7 g/dL — AB (ref 3.5–5.2)
ALK PHOS: 195 U/L — AB (ref 39–117)
ALT: 27 U/L (ref 0–35)
AST: 47 U/L — AB (ref 0–37)
BUN: 23 mg/dL (ref 6–23)
CO2: 23 mEq/L (ref 19–32)
Calcium: 8.5 mg/dL (ref 8.4–10.5)
Chloride: 113 mEq/L — ABNORMAL HIGH (ref 96–112)
Creatinine, Ser: 1.38 mg/dL — ABNORMAL HIGH (ref 0.50–1.10)
GFR calc Af Amer: 42 mL/min — ABNORMAL LOW (ref >90)
GFR calc non Af Amer: 36 mL/min — ABNORMAL LOW (ref >90)
Glucose, Bld: 156 mg/dL — ABNORMAL HIGH (ref 70–99)
POTASSIUM: 3.3 meq/L — AB (ref 3.7–5.3)
SODIUM: 147 meq/L (ref 137–147)
TOTAL PROTEIN: 5.7 g/dL — AB (ref 6.0–8.3)
Total Bilirubin: 0.9 mg/dL (ref 0.3–1.2)

## 2014-02-03 LAB — GLUCOSE, CAPILLARY
GLUCOSE-CAPILLARY: 113 mg/dL — AB (ref 70–99)
Glucose-Capillary: 151 mg/dL — ABNORMAL HIGH (ref 70–99)
Glucose-Capillary: 148 mg/dL — ABNORMAL HIGH (ref 70–99)
Glucose-Capillary: 153 mg/dL — ABNORMAL HIGH (ref 70–99)
Glucose-Capillary: 113 mg/dL — ABNORMAL HIGH (ref 70–99)

## 2014-02-03 LAB — CBC
HEMATOCRIT: 29.7 % — AB (ref 36.0–46.0)
Hemoglobin: 9.7 g/dL — ABNORMAL LOW (ref 12.0–15.0)
MCH: 29.2 pg (ref 26.0–34.0)
MCHC: 32.7 g/dL (ref 30.0–36.0)
MCV: 89.5 fL (ref 78.0–100.0)
Platelets: 568 10*3/uL — ABNORMAL HIGH (ref 150–400)
RBC: 3.32 MIL/uL — ABNORMAL LOW (ref 3.87–5.11)
RDW: 24.5 % — ABNORMAL HIGH (ref 11.5–15.5)
WBC: 12.3 10*3/uL — AB (ref 4.0–10.5)

## 2014-02-03 LAB — LACTATE DEHYDROGENASE: LDH: 370 U/L — ABNORMAL HIGH (ref 94–250)

## 2014-02-03 LAB — AMMONIA: AMMONIA: 114 umol/L — AB (ref 11–60)

## 2014-02-03 MED ORDER — METOCLOPRAMIDE HCL 5 MG/ML IJ SOLN
5.0000 mg | Freq: Two times a day (BID) | INTRAMUSCULAR | Status: DC
  Administered 2014-02-03 – 2014-02-04 (×3): 5 mg via INTRAVENOUS
  Administered 2014-02-04: 23:00:00 via INTRAVENOUS
  Filled 2014-02-03: qty 2
  Filled 2014-02-03 (×2): qty 1
  Filled 2014-02-03: qty 2
  Filled 2014-02-03 (×2): qty 1

## 2014-02-03 MED ORDER — FUROSEMIDE 10 MG/ML IJ SOLN
40.0000 mg | Freq: Once | INTRAMUSCULAR | Status: AC
  Administered 2014-02-03: 40 mg via INTRAMUSCULAR
  Filled 2014-02-03: qty 4

## 2014-02-03 MED ORDER — BIOTENE DRY MOUTH MT LIQD
15.0000 mL | Freq: Two times a day (BID) | OROMUCOSAL | Status: DC
  Administered 2014-02-03 – 2014-02-05 (×4): 15 mL via OROMUCOSAL

## 2014-02-03 MED ORDER — CHLORHEXIDINE GLUCONATE 0.12 % MT SOLN
15.0000 mL | Freq: Two times a day (BID) | OROMUCOSAL | Status: DC
  Administered 2014-02-03 – 2014-02-04 (×3): 15 mL via OROMUCOSAL
  Filled 2014-02-03 (×6): qty 15

## 2014-02-03 NOTE — Progress Notes (Signed)
Carolyn [name] is really about the same. She's not too responsive. She continues on her tube feeds. She's at 60 cc an hour.  There is no ammonia level today.  She really has not done much in way of pain medication.  She is had no fever. So far, all cultures are negative. She continues on antibiotics. I probably would keep he's going just for now.  Her labs looked okay. Her creatinine is slowly going up. It is 1.38. Albumin is dropping at 1.7. Her hemoglobin is 9.7. Her calcium is 8.5. When corrected for the albumin, it is 10.8.  She's had no diarrhea. She's getting the lactulose and rifaximin for the elevated ammonia.  I spoke to her daughter today. She understands that we are doing all that we can do to try to improve her status. If she does not improve over the weekend, then we will begin to "pull back" her therapy next week and pursue comfort care measures.  Her vital signs still looked okay. Blood pressure 153/49. Temperature 98.4. Her lungs are clear. Cardiac exam regular rate and rhythm. Abdomen soft. Bowel sounds are present but slightly decreased. Extremities shows 1+ edema.  I would continue to just maintain her level of therapy for now. On Monday, we will then make decisions as to how aggressive to be.  Again, her family all understand very well that she likely will not improve but that we are trying to give her the "benefit of the doubt".  Gilbert 55:22

## 2014-02-03 NOTE — Progress Notes (Signed)
[facility] PROGRESS NOTE  Carolyn [name] KPT:[id number] DOB: 12-Jan-1936 DOA: 01/27/2014 PCP: Precious Reel, MD Brief Narrative:   Carolyn [name] is an 78 y.o. female with a PMH of locally advanced pancreatic cancer obstructive jaundice status post biliary stent, currently being treated with single agent gemcitabine, last dose 01/17/14 (dose held 01/24/14 secondary to abnormal labs) who was admitted 01/28/14 with a one-week history of progressive weakness, anorexia, and lethargy. Upon initial evaluation in the ED, patient was noted to have a hemoglobin of 6.6 and a creatinine of 1.54. She has been persistently encephalopathic and ammonia levels are elevated. Her condition has deteriorated. Initial conversation with husband about goals of care done 01/28/14, he would like for the patient to have a CT scan of her abdomen/pelvis to find out if the cancer has gotten worse.   Assessment/Plan: Principal Problem:   Pancreatic cancer with chemotherapy associated anemia and thrombocytopenia and failure to thrive  The patient was admitted and a septic workup undertaken. No evidence of pneumonia on chest x-ray, urinalysis negative for nitrites and leukocytes. Culture pending. Check lactic acid.  Pt is s/p PRBC transfusion Most recent chemotherapy held secondary to thrombocytopenia. Platelet count recovered.  Dr. Marin Olp following. Plan will be for feeding tube until patient condition improved. NG tube in place and tube feeds per dietitian.  Active Problems:   Abdominal pain  Attempt to control with morphine prn and discontinue Fentanyl patch given patient's hypersomnolence.  Severe protein calorie malnutrition / Anorexia  The patient is anorexic and likely cachectic from her underlying malignancy, which appears to be entering the end stages.  NG tube and dietitian to manage feeds  Pt has increasing residuals. Will add reglan and decrease rate. With decrease in residuals will plan on increasing back to  goal. Discussed with nursing.   Elevated LFTs  Has a stent in the common bile duct. Abdominal ultrasound done 01/28/14 showing biliary stent in place, with possible liver metastasis.  CT of abdomen reports well defined water density lesions scattered throughout the liver parenchyma which are probably hepatic cyst per radiologist report. There is low attenuation area measuring 2.7 x 1.7 cm in the posterior right liver which is not quantitatively change in the interval. Unclear exact cause of the etiology of raise LFTs, ammonia level elevated. Now that patient has NG tube in place agree with changing lactulose to be administered via NG tube.   Dehydration / acute renal failure  Continue to hydrate.  Resolved. Currently serum creatinine steady at 1.3 and steady  Confusion / metabolic encephalopathy / hyperammonemia  Likely from metabolic encephalopathy or pain medication or combination of both. Urinalysis negative for nitrites and leukocytes. Chest x-ray clear. LFTs elevated. No clear source of infection, but with stent in the common bile duct, may have an intra-abdominal infection. Ultrasound done 01/28/14, unrevealing. Continue empiric Zosyn. Ammonia level elevated. Lactulose on board as well as rifaximine  CT of the head done on admission was negative. Not much improvement in condition.  DVT Prophylaxis  Continue SCDs.  Hypokalemia - Replaced IV and reassess  Code Status: DNR Family Communication:  Discussed with family member at bedside and spouse Disposition Plan: Patient may require NG tube placement for feeds. Discussed briefly with daughter   Consultants:  Oncology  Procedures:  none  Antibiotics: Zosyn  HPI/Subjective: NG tube in place. Patient is not interacting much with family  Objective: Filed Vitals:   02/03/14 1311  BP: 162/45  Pulse: 87  Temp: 98 F (36.7 C)  Resp: 16  Intake/Output Summary (Last 24 hours) at 02/03/14 1349 Last data filed at 02/03/14  1300  Gross per 24 hour  Intake      0 ml  Output   1300 ml  Net  -1300 ml   Filed Weights   01/27/14 2053  Weight: 82.373 kg (181 lb 9.6 oz)    Exam:   General:  Pt in NAD, alert and awake  Cardiovascular: RRR, no MRG  Respiratory: CTA BL, no wheezes  Abdomen: soft, ND, hypoactive bowel sounds  Musculoskeletal: no cyanosis or clubbing   Data Reviewed: Basic Metabolic Panel:  Recent Labs Lab 01/31/14 0415 01/31/14 0515 02/01/14 0430 02/01/14 1835 02/02/14 0541 02/03/14 0546  NA 145  --  145 144 144 147  K 2.9*  --  3.6* 3.5* 3.2* 3.3*  CL 108  --  109 109 109 113*  CO2 22  --  22 23 23 23   GLUCOSE 66*  --  98 85 102* 156*  BUN 25*  --  24* 23 24* 23  CREATININE 1.23*  --  1.28* 1.29* 1.33* 1.38*  CALCIUM 9.5  --  9.4 9.5 9.2 8.5  MG  --  1.8  --  1.8  --   --   PHOS  --   --   --  2.7  --   --    Liver Function Tests:  Recent Labs Lab 01/30/14 0610 01/31/14 0415 02/01/14 0430 02/02/14 0541 02/03/14 0546  AST 98* 89* 76* 61* 47*  ALT 46* 41* 35 30 27  ALKPHOS 226* 207* 211* 198* 195*  BILITOT 1.4* 1.2 1.1 1.1 0.9  PROT 6.2 6.0 6.1 5.8* 5.7*  ALBUMIN 2.2* 2.1* 2.1* 1.9* 1.7*   No results found for this basename: LIPASE, AMYLASE,  in the last 168 hours  Recent Labs Lab 01/30/14 0610 01/31/14 0410 02/01/14 0430 02/02/14 0539 02/03/14 1255  AMMONIA 105* 58 104* 121* 114*   CBC:  Recent Labs Lab 01/30/14 0610 01/31/14 0415 02/01/14 0430 02/02/14 0541 02/03/14 0546  WBC 14.3* 13.1* 12.6* 11.5* 12.3*  HGB 10.3* 10.0* 10.1* 10.0* 9.7*  HCT 30.7* 30.2* 30.9* 30.2* 29.7*  MCV 85.5 86.5 87.5 86.5 89.5  PLT 458* 549* 598* 626* 568*   Cardiac Enzymes:  Recent Labs Lab 01/27/14 1455  TROPONINI <0.30   BNP (last 3 results)  Recent Labs  01/27/14 1527  PROBNP 1669.0*   CBG:  Recent Labs Lab 02/02/14 1944 02/02/14 2354 02/03/14 0438 02/03/14 0819 02/03/14 1151  GLUCAP 125* 134* 151* 148* 153*    Recent Results (from the  past 240 hour(s))  URINE CULTURE     Status: None   Collection Time    01/27/14  5:16 PM      Result Value Ref Range Status   Specimen Description URINE, CATHETERIZED   Final   Special Requests NONE   Final   Culture  Setup Time     Final   Value: 01/27/2014 21:40     Performed at [facility] Count     Final   Value: NO GROWTH     Performed at [facility]   Culture     Final   Value: NO GROWTH     Performed at [facility]   Report Status 01/28/2014 FINAL   Final     Studies: Dg Abd 1 View  02/01/2014   CLINICAL DATA:  NG tube replacement.  EXAM: ABDOMEN - 1 VIEW 9:31 p.m.  COMPARISON:  02/01/2014 at 7:26 p.m.  FINDINGS: NG tube has been advanced and the tip is now in the body of the stomach.  Bowel gas pattern is normal. Biliary stent is noted. Power port in place in the superior vena cava.  IMPRESSION: NG tube tip is now in the body of the stomach.  No other change.   Electronically Signed   By: Rozetta Nunnery M.D.   On: 02/01/2014 21:49   Dg Abd 1 View  02/01/2014   CLINICAL DATA:  Evaluate nasogastric tube placement.  EXAM: ABDOMEN - 1 VIEW  COMPARISON:  02/01/2014.  FINDINGS: Two views of the abdomen demonstrate a nasogastric tube in position with tip at the level of the gastroesophageal junction. Diffuse dilatation of bowel is noted, however, there is extensive patient motion which limits assessment, making discrimination between small bowel and colon challenging. There is clearly distal colonic and rectal gas, suggesting that this bowel dilatation is likely related to ileus, rather than obstruction. A biliary stent is noted.  IMPRESSION: 1. Tip of nasogastric tube is at the gastroesophageal junction, and could be advanced 10-15 cm for more optimal placement. 2. Additional findings, as above, similar to recent prior examinations.   Electronically Signed   By: Vinnie Langton M.D.   On: 02/01/2014 19:50   Dg Abd 1 View  02/01/2014   CLINICAL DATA:  NG  tube assessment  EXAM: ABDOMEN - 1 VIEW  COMPARISON:  CT 01/31/2014.  FINDINGS: NG tube noted with tip at the gastroesophageal junction, more distal placement should be considered. Biliary stent noted. Mild prominence of the small bowel and colonic gas pattern, mild ileus cannot be excluded. No free air. Aortoiliac atherosclerotic vascular disease. 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  IMPRESSION: NG tube noted with tip at the gastroesophageal junction. More distal placement should be considered .   Electronically Signed   By: Marcello Moores  Register   On: 02/01/2014 15:21    Scheduled Meds: . antiseptic oral rinse  15 mL Mouth Rinse q12n4p  . atropine  2 drop Sublingual TID  . chlorhexidine  15 mL Mouth Rinse BID  . feeding supplement (RESOURCE BREEZE)  1 Container Oral TID BM  . lactulose  20 g Per Tube BID  . lactulose  300 mL Rectal Once  . metoCLOPramide (REGLAN) injection  5 mg Intravenous Q12H  . pantoprazole (PROTONIX) IV  40 mg Intravenous QHS  . piperacillin-tazobactam (ZOSYN)  IV  3.375 g Intravenous 3 times per day  . rifaximin  550 mg Oral TID   Continuous Infusions: . sodium chloride 50 mL/hr at 02/03/14 0433  . feeding supplement (OSMOLITE 1.2 CAL) 1,000 mL (02/03/14 1249)    Principal Problem:   Pancreatic cancer Active Problems:   Protein-calorie malnutrition, severe   Intractable abdominal pain   Anorexia   Dehydration   Confusion   Anemia   Failure to thrive   Encephalopathy, metabolic   Hyperammonemia    Time spent: > 35 minutes    [facility] Pager [phone] If 7PM-7AM, please contact night-coverage at [email or web address], password [facility] 02/03/2014, 1:49 PM  LOS: 7 days

## 2014-02-03 NOTE — Progress Notes (Addendum)
Gastric residual 425cc, MD notified, will decrease rate to 71ml/hr and continue to monitor.

## 2014-02-04 LAB — CBC
HEMATOCRIT: 32.3 % — AB (ref 36.0–46.0)
HEMOGLOBIN: 10.6 g/dL — AB (ref 12.0–15.0)
MCH: 28.5 pg (ref 26.0–34.0)
MCHC: 32.8 g/dL (ref 30.0–36.0)
MCV: 86.8 fL (ref 78.0–100.0)
Platelets: 634 10*3/uL — ABNORMAL HIGH (ref 150–400)
RBC: 3.72 MIL/uL — ABNORMAL LOW (ref 3.87–5.11)
RDW: 25.1 % — ABNORMAL HIGH (ref 11.5–15.5)
WBC: 13.9 10*3/uL — AB (ref 4.0–10.5)

## 2014-02-04 LAB — COMPREHENSIVE METABOLIC PANEL
ALT: 24 U/L (ref 0–35)
AST: 45 U/L — ABNORMAL HIGH (ref 0–37)
Albumin: 1.8 g/dL — ABNORMAL LOW (ref 3.5–5.2)
Alkaline Phosphatase: 207 U/L — ABNORMAL HIGH (ref 39–117)
BUN: 22 mg/dL (ref 6–23)
CO2: 25 mEq/L (ref 19–32)
CREATININE: 1.29 mg/dL — AB (ref 0.50–1.10)
Calcium: 8.7 mg/dL (ref 8.4–10.5)
Chloride: 110 mEq/L (ref 96–112)
GFR calc Af Amer: 46 mL/min — ABNORMAL LOW (ref >90)
GFR calc non Af Amer: 39 mL/min — ABNORMAL LOW (ref >90)
Glucose, Bld: 135 mg/dL — ABNORMAL HIGH (ref 70–99)
Potassium: 2.6 mEq/L — CL (ref 3.7–5.3)
Sodium: 148 mEq/L — ABNORMAL HIGH (ref 137–147)
Total Bilirubin: 0.9 mg/dL (ref 0.3–1.2)
Total Protein: 5.8 g/dL — ABNORMAL LOW (ref 6.0–8.3)

## 2014-02-04 LAB — GLUCOSE, CAPILLARY
GLUCOSE-CAPILLARY: 144 mg/dL — AB (ref 70–99)
GLUCOSE-CAPILLARY: 158 mg/dL — AB (ref 70–99)
Glucose-Capillary: 126 mg/dL — ABNORMAL HIGH (ref 70–99)
Glucose-Capillary: 149 mg/dL — ABNORMAL HIGH (ref 70–99)
Glucose-Capillary: 163 mg/dL — ABNORMAL HIGH (ref 70–99)

## 2014-02-04 LAB — AMMONIA: Ammonia: 113 umol/L — ABNORMAL HIGH (ref 11–60)

## 2014-02-04 LAB — LACTATE DEHYDROGENASE: LDH: 405 U/L — ABNORMAL HIGH (ref 94–250)

## 2014-02-04 MED ORDER — PHENOL 1.4 % MT LIQD
1.0000 | OROMUCOSAL | Status: DC | PRN
  Filled 2014-02-04: qty 177

## 2014-02-04 MED ORDER — POTASSIUM CHLORIDE 10 MEQ/100ML IV SOLN
10.0000 meq | INTRAVENOUS | Status: AC
  Administered 2014-02-04 (×4): 10 meq via INTRAVENOUS
  Filled 2014-02-04 (×4): qty 100

## 2014-02-04 MED ORDER — SCOPOLAMINE 1 MG/3DAYS TD PT72
1.0000 | MEDICATED_PATCH | TRANSDERMAL | Status: DC
  Administered 2014-02-04: 1.5 mg via TRANSDERMAL
  Filled 2014-02-04: qty 1

## 2014-02-04 MED ORDER — POTASSIUM CHLORIDE CRYS ER 20 MEQ PO TBCR
30.0000 meq | EXTENDED_RELEASE_TABLET | ORAL | Status: AC
  Administered 2014-02-04 (×2): 30 meq via ORAL
  Filled 2014-02-04 (×2): qty 1

## 2014-02-04 NOTE — Progress Notes (Signed)
Spouse requested the ng tube be removed and cbg's be stopped and "let God's will be done". Ng tube was removed.

## 2014-02-04 NOTE — Progress Notes (Signed)
Current residual 175cc, at this time daughter and husband do not want tube feeds advanced to goal rate of 65, due to concern for patient's comfort and worry that patient's residual will increase and cause discomfort.  Will continue to monitor.

## 2014-02-04 NOTE — Progress Notes (Addendum)
Residuals increasing, currently 250cc.  md made aware, per order will decrease tube feed to 20cc per hour and not advance at this time, pending family conversation with Dr Marin Olp in am.

## 2014-02-04 NOTE — Progress Notes (Signed)
Spouse and daughter present have decided to continue with comfort measures only due to patient's status.

## 2014-02-04 NOTE — Progress Notes (Signed)
Patient appears more comfortable at this time--facial expression is relaxed;respirations-not using accessory muscles. Spouse and family remain at bedside.

## 2014-02-04 NOTE — Progress Notes (Signed)
CRITICAL VALUE ALERT  Critical value received: potassium level 2.6  Date of notification:  02/04/14  Time of notification:  0632  Critical value read back:yes  Nurse who received alert:  Hart Carwin RN  MD notified (1st page):  Jonette Eva( on call provider)  Time of first page:  [phone]  MD notified (2nd page):  Time of second page:  Responding MD:  Jonette Eva Time MD responded:    New orders placed for Potassium and potassium runs.

## 2014-02-04 NOTE — Progress Notes (Signed)
[facility] PROGRESS NOTE  Carolyn [name] JAS:[id number] DOB: 18-Aug-1936 DOA: 01/27/2014 PCP: Precious Reel, MD Brief Narrative:   Carolyn [name] is an 78 y.o. female with a PMH of locally advanced pancreatic cancer obstructive jaundice status post biliary stent, currently being treated with single agent gemcitabine, last dose 01/17/14 (dose held 01/24/14 secondary to abnormal labs) who was admitted 01/28/14 with a one-week history of progressive weakness, anorexia, and lethargy. Upon initial evaluation in the ED, patient was noted to have a hemoglobin of 6.6 and a creatinine of 1.54. She has been persistently encephalopathic and ammonia levels are elevated. Her condition has deteriorated. If no improvement with current regimen by 02/07/2014 plan is for comfort care measures to be initiated.   Assessment/Plan: Principal Problem:   Pancreatic cancer with chemotherapy associated anemia and thrombocytopenia and failure to thrive  Currently with NG tube in place. But no significant improvement in condition and patient with increased somnolence despite medical management. If no improvement by 02/10/2014 plan will be for comfort care measures.  Active Problems:   Abdominal pain  Attempt to control with morphine prn and discontinuesd Fentanyl patch given patient's hypersomnolence.  Severe protein calorie malnutrition / Anorexia  The patient is anorexic and likely cachectic from her underlying malignancy, which appears to be entering the end stages.  NG tube and dietitian to manage feeds  Pt has increasing residuals. Will add reglan and decrease rate. With decrease in residuals will plan on increasing back to goal. Discussed with nursing.   Elevated LFTs  Has a stent in the common bile duct. Abdominal ultrasound done 01/28/14 showing biliary stent in place, with possible liver metastasis.  CT of abdomen reports well defined water density lesions scattered throughout the liver parenchyma which are probably  hepatic cyst per radiologist report. There is low attenuation area measuring 2.7 x 1.7 cm in the posterior right liver which is not quantitatively change in the interval. Unclear exact cause of the etiology of raise LFTs, ammonia level elevated. Now that patient has NG tube in place agree with changing lactulose to be administered via NG tube.   Dehydration / acute renal failure  Continue to hydrate.  Resolved. Currently serum creatinine steady at 1.3 and steady  Confusion / metabolic encephalopathy / hyperammonemia  Likely from metabolic encephalopathy or pain medication or combination of both. Urinalysis negative for nitrites and leukocytes. Chest x-ray clear. LFTs elevated. No clear source of infection, but with stent in the common bile duct, may have an intra-abdominal infection. Ultrasound done 01/28/14, unrevealing. Continue empiric Zosyn. Ammonia level elevated. Lactulose on board as well as rifaximine  CT of the head done on admission was negative. Not much improvement in condition.  DVT Prophylaxis  Continue SCDs.  Hypokalemia - Replaced IV and reassess  Code Status: DNR Family Communication:  Discussed with family member at bedside and spouse Disposition Plan: Patient may require NG tube placement for feeds. Discussed briefly with daughter   Consultants:  Oncology  Procedures:  none  Antibiotics: Zosyn  HPI/Subjective: NG tube in place. Patient is not interacting much with family  Objective: Filed Vitals:   02/04/14 0649  BP: 173/51  Pulse:   Temp:   Resp:     Intake/Output Summary (Last 24 hours) at 02/04/14 1331 Last data filed at 02/03/14 2128  Gross per 24 hour  Intake    825 ml  Output   1200 ml  Net   -375 ml   Filed Weights   01/27/14 2053 02/04/14 0649  Weight:  82.373 kg (181 lb 9.6 oz) 97.7 kg (215 lb 6.2 oz)    Exam:   General:  Pt in NAD, alert and awake  Cardiovascular: RRR, no MRG  Respiratory: CTA BL, no wheezes  Abdomen: soft,  ND, hypoactive bowel sounds  Musculoskeletal: no cyanosis or clubbing   Data Reviewed: Basic Metabolic Panel:  Recent Labs Lab 01/31/14 0415 01/31/14 0515 02/01/14 0430 02/01/14 1835 02/02/14 0541 02/03/14 0546 02/04/14 0540  NA 145  --  145 144 144 147 148*  K 2.9*  --  3.6* 3.5* 3.2* 3.3* 2.6*  CL 108  --  109 109 109 113* 110  CO2 22  --  22 23 23 23 25   GLUCOSE 66*  --  98 85 102* 156* 135*  BUN 25*  --  24* 23 24* 23 22  CREATININE 1.23*  --  1.28* 1.29* 1.33* 1.38* 1.29*  CALCIUM 9.5  --  9.4 9.5 9.2 8.5 8.7  MG  --  1.8  --  1.8  --   --   --   PHOS  --   --   --  2.7  --   --   --    Liver Function Tests:  Recent Labs Lab 01/31/14 0415 02/01/14 0430 02/02/14 0541 02/03/14 0546 02/04/14 0540  AST 89* 76* 61* 47* 45*  ALT 41* 35 30 27 24   ALKPHOS 207* 211* 198* 195* 207*  BILITOT 1.2 1.1 1.1 0.9 0.9  PROT 6.0 6.1 5.8* 5.7* 5.8*  ALBUMIN 2.1* 2.1* 1.9* 1.7* 1.8*   No results found for this basename: LIPASE, AMYLASE,  in the last 168 hours  Recent Labs Lab 01/31/14 0410 02/01/14 0430 02/02/14 0539 02/03/14 1255 02/04/14 0549  AMMONIA 58 104* 121* 114* 113*   CBC:  Recent Labs Lab 01/31/14 0415 02/01/14 0430 02/02/14 0541 02/03/14 0546 02/04/14 0540  WBC 13.1* 12.6* 11.5* 12.3* 13.9*  HGB 10.0* 10.1* 10.0* 9.7* 10.6*  HCT 30.2* 30.9* 30.2* 29.7* 32.3*  MCV 86.5 87.5 86.5 89.5 86.8  PLT 549* 598* 626* 568* 634*   Cardiac Enzymes: No results found for this basename: CKTOTAL, CKMB, CKMBINDEX, TROPONINI,  in the last 168 hours BNP (last 3 results)  Recent Labs  01/27/14 1527  PROBNP 1669.0*   CBG:  Recent Labs Lab 02/03/14 1950 02/04/14 0005 02/04/14 0354 02/04/14 0724 02/04/14 1155  GLUCAP 113* 144* 126* 149* 158*    Recent Results (from the past 240 hour(s))  URINE CULTURE     Status: None   Collection Time    01/27/14  5:16 PM      Result Value Ref Range Status   Specimen Description URINE, CATHETERIZED   Final    Special Requests NONE   Final   Culture  Setup Time     Final   Value: 01/27/2014 21:40     Performed at [facility] Count     Final   Value: NO GROWTH     Performed at [facility]   Culture     Final   Value: NO GROWTH     Performed at [facility]   Report Status 01/28/2014 FINAL   Final     Studies: No results found.  Scheduled Meds: . antiseptic oral rinse  15 mL Mouth Rinse q12n4p  . atropine  2 drop Sublingual TID  . chlorhexidine  15 mL Mouth Rinse BID  . feeding supplement (RESOURCE BREEZE)  1 Container Oral TID BM  .  lactulose  20 g Per Tube BID  . lactulose  300 mL Rectal Once  . metoCLOPramide (REGLAN) injection  5 mg Intravenous Q12H  . pantoprazole (PROTONIX) IV  40 mg Intravenous QHS  . piperacillin-tazobactam (ZOSYN)  IV  3.375 g Intravenous 3 times per day  . potassium chloride  10 mEq Intravenous Q1 Hr x 4  . rifaximin  550 mg Oral TID   Continuous Infusions: . sodium chloride 50 mL/hr at 02/03/14 0433  . feeding supplement (OSMOLITE 1.2 CAL) 1,000 mL (02/03/14 2027)    Principal Problem:   Pancreatic cancer Active Problems:   Protein-calorie malnutrition, severe   Intractable abdominal pain   Anorexia   Dehydration   Confusion   Anemia   Failure to thrive   Encephalopathy, metabolic   Hyperammonemia    Time spent: > 35 minutes    [place] [facility] Pager [phone] If 7PM-7AM, please contact night-coverage at [email or web address], password [facility] 02/04/2014, 1:31 PM  LOS: 8 days

## 2014-02-05 ENCOUNTER — Inpatient Hospital Stay (HOSPITAL_COMMUNITY)
Admission: AD | Admit: 2014-02-05 | Discharge: 2014-03-05 | DRG: 436 | Disposition: E | Source: Ambulatory Visit | Attending: Hematology & Oncology | Admitting: Hematology & Oncology

## 2014-02-05 ENCOUNTER — Other Ambulatory Visit: Payer: Self-pay | Admitting: Hematology & Oncology

## 2014-02-05 DIAGNOSIS — Z6838 Body mass index (BMI) 38.0-38.9, adult: Secondary | ICD-10-CM

## 2014-02-05 DIAGNOSIS — E7220 Disorder of urea cycle metabolism, unspecified: Secondary | ICD-10-CM | POA: Diagnosis present

## 2014-02-05 DIAGNOSIS — K769 Liver disease, unspecified: Secondary | ICD-10-CM | POA: Diagnosis present

## 2014-02-05 DIAGNOSIS — Z515 Encounter for palliative care: Secondary | ICD-10-CM | POA: Diagnosis not present

## 2014-02-05 DIAGNOSIS — C259 Malignant neoplasm of pancreas, unspecified: Secondary | ICD-10-CM | POA: Diagnosis present

## 2014-02-05 DIAGNOSIS — C801 Malignant (primary) neoplasm, unspecified: Secondary | ICD-10-CM | POA: Diagnosis present

## 2014-02-05 DIAGNOSIS — Z66 Do not resuscitate: Secondary | ICD-10-CM | POA: Diagnosis present

## 2014-02-05 DIAGNOSIS — E46 Unspecified protein-calorie malnutrition: Secondary | ICD-10-CM | POA: Diagnosis present

## 2014-02-05 DIAGNOSIS — K7682 Hepatic encephalopathy: Secondary | ICD-10-CM

## 2014-02-05 DIAGNOSIS — C772 Secondary and unspecified malignant neoplasm of intra-abdominal lymph nodes: Secondary | ICD-10-CM

## 2014-02-05 DIAGNOSIS — K7290 Hepatic failure, unspecified without coma: Secondary | ICD-10-CM

## 2014-02-05 LAB — COMPREHENSIVE METABOLIC PANEL
ALT: 22 U/L (ref 0–35)
AST: 43 U/L — ABNORMAL HIGH (ref 0–37)
Albumin: 1.7 g/dL — ABNORMAL LOW (ref 3.5–5.2)
Alkaline Phosphatase: 202 U/L — ABNORMAL HIGH (ref 39–117)
BUN: 23 mg/dL (ref 6–23)
CALCIUM: 8.6 mg/dL (ref 8.4–10.5)
CO2: 26 mEq/L (ref 19–32)
CREATININE: 1.21 mg/dL — AB (ref 0.50–1.10)
Chloride: 111 mEq/L (ref 96–112)
GFR calc non Af Amer: 43 mL/min — ABNORMAL LOW (ref >90)
GFR, EST AFRICAN AMERICAN: 49 mL/min — AB (ref >90)
Glucose, Bld: 107 mg/dL — ABNORMAL HIGH (ref 70–99)
Potassium: 3.2 mEq/L — ABNORMAL LOW (ref 3.7–5.3)
Sodium: 149 mEq/L — ABNORMAL HIGH (ref 137–147)
Total Bilirubin: 1 mg/dL (ref 0.3–1.2)
Total Protein: 6 g/dL (ref 6.0–8.3)

## 2014-02-05 LAB — LACTATE DEHYDROGENASE: LDH: 446 U/L — AB (ref 94–250)

## 2014-02-05 LAB — CBC
HCT: 35.2 % — ABNORMAL LOW (ref 36.0–46.0)
HEMOGLOBIN: 11.4 g/dL — AB (ref 12.0–15.0)
MCH: 29.5 pg (ref 26.0–34.0)
MCHC: 32.4 g/dL (ref 30.0–36.0)
MCV: 91.2 fL (ref 78.0–100.0)
PLATELETS: 585 10*3/uL — AB (ref 150–400)
RBC: 3.86 MIL/uL — ABNORMAL LOW (ref 3.87–5.11)
RDW: 26.1 % — ABNORMAL HIGH (ref 11.5–15.5)
WBC: 16.3 10*3/uL — AB (ref 4.0–10.5)

## 2014-02-05 LAB — AMMONIA: AMMONIA: 118 umol/L — AB (ref 11–60)

## 2014-02-05 MED ORDER — SCOPOLAMINE 1 MG/3DAYS TD PT72: 1.0000 | MEDICATED_PATCH | TRANSDERMAL | Status: DC

## 2014-02-05 MED ORDER — SODIUM CHLORIDE 0.9 % IV SOLN
2.0000 mg/h | INTRAVENOUS | Status: DC
  Administered 2014-02-05: 2 mg/h via INTRAVENOUS
  Filled 2014-02-05: qty 10

## 2014-02-05 MED ORDER — LORAZEPAM 2 MG/ML IJ SOLN: 1.0000 mg | INTRAMUSCULAR | Status: AC | PRN

## 2014-02-05 MED ORDER — MORPHINE BOLUS VIA INFUSION
1.0000 mg | Freq: Once | INTRAVENOUS | Status: AC
  Administered 2014-02-05: 1 mg via INTRAVENOUS

## 2014-02-05 MED ORDER — LORAZEPAM 2 MG/ML IJ SOLN
1.0000 mg | INTRAMUSCULAR | Status: DC | PRN
  Administered 2014-02-05: 1 mg via INTRAVENOUS
  Filled 2014-02-05: qty 1

## 2014-02-05 MED ORDER — MORPHINE (PF) INJECTION FOR INHALATION 10 MG/ML
10.0000 mg | RESPIRATORY_TRACT | Status: DC | PRN
  Administered 2014-02-05: 10 mg via RESPIRATORY_TRACT
  Filled 2014-02-05: qty 1

## 2014-02-05 MED ORDER — SODIUM CHLORIDE 0.9 % IV SOLN
1.0000 mg/h | INTRAVENOUS | Status: DC
  Filled 2014-02-05 (×2): qty 10

## 2014-02-05 MED ORDER — ATROPINE SULFATE 1 % OP SOLN
2.0000 [drp] | Freq: Four times a day (QID) | OPHTHALMIC | Status: DC
  Administered 2014-02-05 (×2): 2 [drp] via SUBLINGUAL
  Filled 2014-02-05: qty 2

## 2014-02-05 MED ORDER — LORAZEPAM 2 MG/ML IJ SOLN
1.0000 mg | INTRAMUSCULAR | Status: DC | PRN
  Administered 2014-02-05 – 2014-02-08 (×8): 1 mg via INTRAVENOUS
  Filled 2014-02-05 (×8): qty 1

## 2014-02-05 MED ORDER — ATROPINE SULFATE 1 % OP SOLN: 2.0000 [drp] | Freq: Four times a day (QID) | OPHTHALMIC | Status: AC

## 2014-02-05 MED ORDER — SODIUM CHLORIDE 0.9 % IV SOLN: INTRAVENOUS | Status: DC

## 2014-02-05 MED ORDER — MORPHINE SULFATE 2 MG/ML IJ SOLN: 2.0000 mg | INTRAMUSCULAR | Status: AC | PRN

## 2014-02-05 MED ORDER — SCOPOLAMINE 1 MG/3DAYS TD PT72: 1.0000 | MEDICATED_PATCH | TRANSDERMAL | Status: AC

## 2014-02-05 NOTE — Care Management Note (Signed)
Cm spoke with Oncologist Dr. Marin Olp concerning plan of care. Per MD patient anticipated in hospital death, if necessary agreeable to change pt's admission status to GIP. Per Dr. Marin Olp would transition to Attending. Current Attending Dr.Vega paged concerning plan of care. Awaiting return call. Upon approval Cm will offer pt's family choice of Inpatient Hospice Agencies.    Venita Lick Sierria Bruney,MSN,RN [phone]

## 2014-02-05 NOTE — Progress Notes (Signed)
[facility] Social Work Note: Hospice diagnosis: Pancreatic CA 157.9  Received request to meet with family to explain hospital hospice services from [place]. Chart reviewed and met with spouse and daughter at bedside to explain services and confirm interest. Per family patient is previous [facility] patient so they are familiar with process. Once eligibility confirmed through [place], completed admission paperwork with family outside room per their request. They do not wish for the word "hospice" to be used inside room. Signed forms delivered to admitting at 11:58. CSW aware. [facility] RN and CSW to follow with Dr. Marin Olp as attending. Appreciate help from [facility]. Thank you. Erling Conte LCSW [phone]

## 2014-02-05 NOTE — H&P (Signed)
Carolyn [name]  [phone]    ADMISSION NOTE  Admitting MD: Burney Gauze ,MD  Attending MD: Rochele Pages  Reason for Admission: Metastatic Pancreatic Cancer                                          End of Life issues   HPI: Carolyn [name] is an 78 y.o. female with a history of locally advanced pancreatic cancer obstructive jaundice status post biliary stent, s/p single agent gemcitabine, last dose 01/17/14 (dose held 01/24/14 secondary to abnormal labs) who was admitted 01/28/14 with a one-week history of progressive weakness, anorexia, and lethargy. Upon initial evaluation in the ED, patient was noted to have severe anemia and renal failure. She had encephalopathy with elevated ammonia levels.Her anemia was treated with supportive transfusion of blood.   Patient's clinical status deteriorated further. She developed liver failure, with ammonia levels of 105 as of 01/30/14. CT scan of the abdomen and pelvis CT scan did not show any obvious spread of her pancreatic cancer, although still has a pancreatic head mass.There was no disease in the abdomen. All her supportive issues were addressed. A feeding tube was placed for nutrition on 02/02/14. However despite all supportive care, her overall status worsened. She is not responding to stimuli, has increased laryngeal secretions requiring atropine and scopolamine NGT was  removed. Therefore, no further aggressive measures are to be performed. Her ammonia levels are now 118.   Dr. Marin Olp spoke to her family. All measures have been exhausted to reverse her status. There is no need to transfer her to a SNF or Hospice facility. She is to be readmitted under Dr. Antonieta Pert service for continuation of care and management of end of life issues. She is DNR.   PMH:  Past Medical History  Diagnosis Date  . Hypertension   . Hyperlipidemia   . GERD (gastroesophageal reflux disease)   . Asthma   . Obstructive jaundice 06/2013  .  Pancreatitis   . Arthritis   . Heart murmur   . Thyroid disease   . Cancer   . Pancreatic cancer 07/05/13    pancreatic head=adenocarcinoma    Surgeries:  Past Surgical History  Procedure Laterality Date  . Neck surgery  2005/2007  . Hand surgery  2010  . Cataract extraction    . Abdominal hysterectomy    . Eus N/A 07/05/2013    Procedure: ESOPHAGEAL ENDOSCOPIC ULTRASOUND (EUS) RADIAL;  Surgeon: Arta Silence, MD;  Location: [facility];  Service: Endoscopy;  Laterality: N/A;  . Ercp N/A 07/05/2013    Procedure: ENDOSCOPIC RETROGRADE CHOLANGIOPANCREATOGRAPHY (ERCP);  Surgeon: Arta Silence, MD;  Location: [name];  Service: Endoscopy;  Laterality: N/A;  . Buttock cyst removal    . Cholecystectomy      Allergies:  Allergies  Allergen Reactions  . Alka-Seltzer [Aspirin Effervescent] Swelling    Medications:   Scheduled Meds: . antiseptic oral rinse  15 mL Mouth Rinse q12n4p  . atropine  2 drop Sublingual QID  . feeding supplement (RESOURCE BREEZE)  1 Container Oral TID BM  . pantoprazole (PROTONIX) IV  40 mg Intravenous QHS  . scopolamine  1 patch Transdermal Q72H   Continuous Infusions: . sodium chloride Stopped (02/19/2014 0745)  . morphine 4 mg/hr (02/14/2014 1253)   PRN Meds:.hydrALAZINE, LORazepam, morphine, morphine injection, phenol  ROS: Unable to obtain due to patient's mental status.  Family  History:  Family History  Problem Relation Age of Onset  . Cancer Daughter     Breast    Social History:  reports that she quit smoking about 7 months ago. Her smoking use included Cigarettes. She started smoking about 25 years ago. She has a 24 pack-year smoking history. She has never used smokeless tobacco. She reports that she drinks about .6 ounces of alcohol per week. She reports that she does not use illicit drugs.  Physical Exam:   Filed Vitals:   02/12/2014 0500  BP: 163/57  Pulse: 109  Temp: 97.6 F (36.4 C)  Resp: 48    78 y.o. female  Moaning  intermittently, minimal response to tactile and verbal stimuli. ill appearing  HEENT: Normocephalic, atraumatic, secretions visible . Dentures present Neck supple. no thyromegaly, no cervical or supraclavicular adenopathy  Lungs some crackles audible at the bases, no rhonchi or wheezing Cardiac Tachycardic, occasional ectopic beats. Abdomen soft nontender, bowel sounds decreased throughout. No organomegaly GU/rectal: deferred. Foley with no blood.  Extremities no clubbing cyanosis, 1 + edema bilaterally. SCDs in place  No bruising or petechial rash Neuro:not following commands, moaning at times   LABS: CBC   Recent Labs Lab 02/01/14 0430 02/02/14 0541 02/03/14 0546 02/04/14 0540 02/18/2014 0600  WBC 12.6* 11.5* 12.3* 13.9* 16.3*  HGB 10.1* 10.0* 9.7* 10.6* 11.4*  HCT 30.9* 30.2* 29.7* 32.3* 35.2*  PLT 598* 626* 568* 634* 585*  MCV 87.5 86.5 89.5 86.8 91.2  MCH 28.6 28.7 29.2 28.5 29.5  MCHC 32.7 33.1 32.7 32.8 32.4  RDW 22.0* 23.4* 24.5* 25.1* 26.1*       CMP    Recent Labs Lab 01/31/14 0415 01/31/14 0515 02/01/14 0430 02/01/14 1835 02/02/14 0541 02/03/14 0546 02/04/14 0540 02/13/2014 0600  NA 145  --  145 144 144 147 148* 149*  K 2.9*  --  3.6* 3.5* 3.2* 3.3* 2.6* 3.2*  CL 108  --  109 109 109 113* 110 111  CO2 22  --  22 23 23 23 25 26   GLUCOSE 66*  --  98 85 102* 156* 135* 107*  BUN 25*  --  24* 23 24* 23 22 23   CREATININE 1.23*  --  1.28* 1.29* 1.33* 1.38* 1.29* 1.21*  CALCIUM 9.5  --  9.4 9.5 9.2 8.5 8.7 8.6  MG  --  1.8  --  1.8  --   --   --   --   AST 89*  --  76*  --  61* 47* 45* 43*  ALT 41*  --  35  --  30 27 24 22   ALKPHOS 207*  --  211*  --  198* 195* 207* 202*  BILITOT 1.2  --  1.1  --  1.1 0.9 0.9 1.0        Component Value Date/Time   BILITOT 1.0 02/18/2014 0600   BILITOT 0.60 01/24/2014 1307   BILIDIR PATIENT IDENTIFICATION ERROR. PLEASE DISREGARD RESULTS. ACCOUNT WILL BE CREDITED. 09/04/2013 1717   IBILI PATIENT IDENTIFICATION ERROR.  PLEASE DISREGARD RESULTS. ACCOUNT WILL BE CREDITED. 09/04/2013 1717      Imaging Studies: No results found.      A/P: 78 y.o. female with a history of   1 Locally advanced pancreatic cancer s/p Gemcitabine. Now on comfort care only. She is to be under the care of Dr. Marin Olp for comfort care only. No aggressive measures to be performed. Family is in agreement with plans.   2. Liver Failure with very  elevated ammonia levels. Unknown etiology. No further studies  3. Encephalopathy secondary to #2  4. Anemia,  no further labs or transfusions due to futility of her condition.  5. Severe Protein malnutrition. No NG tube to be placed.  6. Pain, to continue with the same regimen No Fentanyl patch to be given due to the immediate need for comfort. On Morphine now at 3mg /hr with good response Continue Ativan.   7. Increased secretions, to continue on Scopolamine   8. Respiratory: On Morphine in mask for shortness of breath and Ativan for agitation.Continue O2 as needed.   9 . DNR. Order signed.    Coralee Pesa [facility] 02/03/2014 12:31 PM  ADDENDUM: I AGREE WITH THE ABOVE.  SHE WILL STAY IN HOSPITAL UNTIL SHE PASSES TO [facility].  SHE MAY NEED MSO4 DRIP.  I SUSPECT THAT HER SURVIVAL WILL BE < 4-5 DAYS.  SHE IS DNR.  FAMILY IS GRATEFUL FOR THE WONDERFUL CARE THAT SHE HAS GOTTEN ON [facility].  PETE E.  ISAIAH 41:10

## 2014-02-05 NOTE — Progress Notes (Signed)
Nutrition Brief Note   Chart reviewed. NGT removed and TF discontinued Pt now transitioning to comfort care.  No further nutrition interventions warranted at this time.  Please re-consult as needed.   Atlee Abide MS RD LDN Clinical Dietitian [phone]

## 2014-02-05 NOTE — Care Management Note (Signed)
Both Attending Dr. Wendee Beavers and Oncologist Dr. Marin Olp agreeable to GIP status for pt.Cm spoke with patient's spouse and adult daughter at the bedside concerning choice of Inpatient Hospice Agencies. Per spouse choice [facility] to provide GIP for pt. [facility] rep [name] notified of referral. Pt's family informed [facility] rep will arrive to room to discuss GIP status and assess pt's appropriateness. Per family Dr.Ennever to act as attending. Dr. Marin Olp agreeable. Will continue to follow.    Venita Lick Addy Mcmannis,MSN,RN [phone]

## 2014-02-05 NOTE — Care Management Note (Addendum)
Cm spoke with Dr. Marin Olp who states [facility] Ripper to assist with re-admission orders. Cm paged Sherrilyn Rist, PA at [phone] who states she will arrive at [facility] around 1330 to complete admission note. Per PA Dr. Marin Olp to enter admission orders.    Venita Lick Lore Polka,MSN,RN [phone]

## 2014-02-05 NOTE — Discharge Summary (Signed)
Physician Discharge Summary  Carolyn [name] [id number] DOB: [birth year] DOA: 01/27/2014  PCP: Precious Reel, MD  Admit date: 01/27/2014 Discharge date: 02/23/2014  Time spent: > 35 minutes  Recommendations for Outpatient Follow-up:  1. Comfort care to be directed by oncologist (Dr. Marin Olp) in house.   Discharge Diagnoses:  Principal Problem:   Pancreatic cancer Active Problems:   Protein-calorie malnutrition, severe   Intractable abdominal pain   Anorexia   Dehydration   Confusion   Anemia   Failure to thrive   Encephalopathy, metabolic   Hyperammonemia   Discharge Condition: stable, for inpatient hospice  Diet recommendation: Per comfort care measures directed by Dr. Desiree Lucy Weights   01/27/14 2053 02/04/14 0649  Weight: 82.373 kg (181 lb 9.6 oz) 97.7 kg (215 lb 6.2 oz)    History of present illness:  Carolyn [name] is an 78 y.o. female with a PMH of locally advanced pancreatic cancer obstructive jaundice status post biliary stent, currently being treated with single agent gemcitabine, last dose 01/17/14 (dose held 01/24/14 secondary to abnormal labs) who was admitted 01/28/14 with a one-week history of progressive weakness, anorexia, and lethargy. Upon initial evaluation in the ED, patient was noted to have a hemoglobin of 6.6 and a creatinine of 1.54. She has been persistently encephalopathic and ammonia levels are elevated. Her condition has deteriorated. Given no improvement in condition plan is for comfort care measures to be initiated.    Hospital Course:  Patient's condition continued to deteriorate despite multiple aggressive medical management.  Per families request patient will be made comfort care measures.  This has been discussed with Care manager Mechele Claude).  Consultations:  Oncology: Dr. Marin Olp  Discharge Exam: Filed Vitals:   02/08/2014 0500  BP: 163/57  Pulse: 109  Temp: 97.6 F (36.4 C)  Resp: 24    General: Pt resting comfortably, in  NAD Cardiovascular: RRR, no MRG Respiratory: no increased wob, no wheezes, breath sounds BL  Discharge Instructions You were cared for by a hospitalist during your hospital stay. If you have any questions about your discharge medications or the care you received while you were in the hospital after you are discharged, you can call the unit and asked to speak with the hospitalist on call if the hospitalist that took care of you is not available. Once you are discharged, your primary care physician will handle any further medical issues. Please note that NO REFILLS for any discharge medications will be authorized once you are discharged, as it is imperative that you return to your primary care physician (or establish a relationship with a primary care physician if you do not have one) for your aftercare needs so that they can reassess your need for medications and monitor your lab values.  Discharge Orders   Future Appointments Provider Department Dept Phone   02/07/2014 8:45 AM Gwendolyn [place] [phone]   02/07/2014 9:15 AM Volanda [place], [place] [phone]   02/07/2014 9:30 AM [facility] [place] [phone]   02/15/2014 11:00 AM [place] [phone]   02/15/2014 11:30 AM [facility] [place] [phone]   02/22/2014 10:45 AM Gwendolyn [place] [phone]   02/22/2014 11:00 AM [facility] [place] [phone]   Future Orders  Complete By Expires   Diet - low sodium heart healthy  As directed    Discharge instructions  As directed    Increase activity slowly  As directed        Medication List    STOP taking these medications       acetaminophen 325 MG tablet  Commonly known as:  TYLENOL     citalopram 20 MG tablet   Commonly known as:  CELEXA     docusate sodium 100 MG capsule  Commonly known as:  COLACE     feeding supplement (RESOURCE BREEZE) Liqd     fentaNYL 25 MCG/HR patch  Commonly known as:  DURAGESIC - dosed mcg/hr     furosemide 20 MG tablet  Commonly known as:  LASIX     lactose free nutrition Liqd     lidocaine-prilocaine cream  Commonly known as:  EMLA     lipase/protease/amylase 12000 UNITS Cpep capsule  Commonly known as:  CREON-12/PANCREASE     LORazepam 0.5 MG tablet  Commonly known as:  ATIVAN  Replaced by:  LORazepam 2 MG/ML injection     morphine 15 MG tablet  Commonly known as:  MSIR  Replaced by:  morphine 2 MG/ML injection     naproxen sodium 220 MG tablet  Commonly known as:  ANAPROX     omeprazole 20 MG capsule  Commonly known as:  PRILOSEC     ondansetron 8 MG tablet  Commonly known as:  ZOFRAN     potassium chloride SA 20 MEQ tablet  Commonly known as:  K-DUR,KLOR-CON     prochlorperazine 10 MG tablet  Commonly known as:  COMPAZINE     verapamil 120 MG tablet  Commonly known as:  CALAN     Vitamin D 400 UNITS capsule      TAKE these medications       atropine 1 % ophthalmic solution  Place 2 drops under the tongue 4 (four) times daily.     LORazepam 2 MG/ML injection  Commonly known as:  ATIVAN  Inject 0.5 mLs (1 mg total) into the vein every 4 (four) hours as needed for anxiety (restlessness).     morphine 2 MG/ML injection  Inject 1 mL (2 mg total) into the vein every 4 (four) hours as needed.     scopolamine 1 MG/3DAYS  Commonly known as:  TRANSDERM-SCOP  Place 1 patch (1.5 mg total) onto the skin every 3 (three) days.       Allergies  Allergen Reactions  . Alka-Seltzer [Aspirin Effervescent] Swelling      The results of significant diagnostics from this hospitalization (including imaging, microbiology, ancillary and laboratory) are listed below for reference.    Significant Diagnostic Studies: Dg Abd 1 View  02/01/2014    CLINICAL DATA:  NG tube replacement.  EXAM: ABDOMEN - 1 VIEW 9:31 p.m.  COMPARISON:  02/01/2014 at 7:26 p.m.  FINDINGS: NG tube has been advanced and the tip is now in the body of the stomach.  Bowel gas pattern is normal. Biliary stent is noted. Power port in place in the superior vena cava.  IMPRESSION: NG tube tip is now in the body of the stomach.  No other change.   Electronically Signed   By: Rozetta Nunnery M.D.   On: 02/01/2014 21:49   Dg Abd 1 View  02/01/2014   CLINICAL DATA:  Evaluate nasogastric tube placement.  EXAM: ABDOMEN - 1 VIEW  COMPARISON:  02/01/2014.  FINDINGS: Two views of  the abdomen demonstrate a nasogastric tube in position with tip at the level of the gastroesophageal junction. Diffuse dilatation of bowel is noted, however, there is extensive patient motion which limits assessment, making discrimination between small bowel and colon challenging. There is clearly distal colonic and rectal gas, suggesting that this bowel dilatation is likely related to ileus, rather than obstruction. A biliary stent is noted.  IMPRESSION: 1. Tip of nasogastric tube is at the gastroesophageal junction, and could be advanced 10-15 cm for more optimal placement. 2. Additional findings, as above, similar to recent prior examinations.   Electronically Signed   By: Vinnie Langton M.D.   On: 02/01/2014 19:50   Dg Abd 1 View  02/01/2014   CLINICAL DATA:  NG tube assessment  EXAM: ABDOMEN - 1 VIEW  COMPARISON:  CT 01/31/2014.  FINDINGS: NG tube noted with tip at the gastroesophageal junction, more distal placement should be considered. Biliary stent noted. Mild prominence of the small bowel and colonic gas pattern, mild ileus cannot be excluded. No free air. Aortoiliac atherosclerotic vascular disease. 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  IMPRESSION: NG tube noted with tip at the gastroesophageal junction. More distal placement should be considered .   Electronically Signed   By: Marcello Moores  Register    On: 02/01/2014 15:21   Ct Head Wo Contrast  01/27/2014   CLINICAL DATA:  Altered mental status, on chemotherapy for pancreatic cancer  EXAM: CT HEAD WITHOUT CONTRAST  TECHNIQUE: Contiguous axial images were obtained from the base of the skull through the vertex without intravenous contrast.  COMPARISON:  09/04/2013  FINDINGS: No evidence of parenchymal hemorrhage or extra-axial fluid collection. No mass lesion, mass effect, or midline shift.  No CT evidence of acute infarction.  Cerebral volume is within normal limits.  No ventriculomegaly.  The visualized paranasal sinuses are essentially clear. The mastoid air cells are unopacified.  No evidence of calvarial fracture.  IMPRESSION: No evidence of acute intracranial abnormality.   Electronically Signed   By: Julian Hy M.D.   On: 01/27/2014 16:17   Ct Abdomen Pelvis W Contrast  01/31/2014   CLINICAL DATA:  Pancreatic cancer. Jaundice. Lethargy. Severe abdominal pain.  EXAM: CT ABDOMEN AND PELVIS WITH CONTRAST  TECHNIQUE: Multidetector CT imaging of the abdomen and pelvis was performed using the standard protocol following bolus administration of intravenous contrast.  CONTRAST:  162mL OMNIPAQUE IOHEXOL 300 MG/ML  SOLN  COMPARISON:  11/10/2013  FINDINGS: Lung bases show dependent atelectasis, right greater than left with tiny right pleural effusion.  Multiple well-defined water density lesions scattered throughout the liver parenchyma are stable. These are probably hepatic cysts. Poorly defined area of low attenuation measuring approximately 2.7 x 1.7 cm in the posterior right liver is not qualitatively changed in the interval. It is difficult to reproducibly measure given the ill-defined margins.  Spleen is unchanged with a tiny low-density anterior lesion. Stomach is unremarkable. Common bile duct stent remains in place. No substantial biliary dilatation. Pneumobilia persists.  Dilatation of the main pancreatic duct has decreased in the interval,  measuring 9 mm today compared to 16 mm previously. Pancreatic head remains poorly defined and the mass lesion is difficult to identify given ill-defined margins and adjacent structures. Measuring at the same level as on the previous study, it measures 3.2 x 4.2 cm today compared to 2.9 x 4.3 cm previously.  The adrenal glands are unremarkable. Small cysts are evident in both kidneys  Imaging through the pelvis shows no free intraperitoneal  fluid. Foley catheter decompresses the urinary bladder. No pelvic sidewall lymphadenopathy. The uterus is surgically absent. No adnexal mass. Prominent colonic stool noted. The terminal ileum and the appendix are normal.  Diffuse body wall edema is evident.  Bone windows reveal no worrisome lytic or sclerotic osseous lesions.  IMPRESSION: No substantial interval change in the poorly defined pancreatic head mass. The dilatation of the main pancreatic duct is decreased in the interval. There is no associated biliary duct dilatation with common bile duct stent in stable position.  Multiple hepatic cysts within ill-defined lesion in the posterior right liver without substantial change. This ill-defined lesion may represent metastatic disease and continued attention on follow-up imaging recommended.  Bilateral renal cysts.  Atherosclerosis.   Electronically Signed   By: Misty Stanley M.D.   On: 01/31/2014 14:18   Dg Chest Portable 1 View  01/27/2014   CLINICAL DATA:  Altered mental status. Shortness of breath and cough.  EXAM: PORTABLE CHEST - 1 VIEW  COMPARISON:  CT chest, abdomen and pelvis 10/03/2013. Single view of the chest 07/27/2013.  FINDINGS: Port-A-Cath is in place. Heart size is upper normal. Lungs are clear. No pneumothorax or pleural effusion.  IMPRESSION: No acute disease.   Electronically Signed   By: Inge Rise M.D.   On: 01/27/2014 15:59   US Abdomen Limited Ruq  01/29/2014   CLINICAL DATA:  Elevated LFT  EXAM: US ABDOMEN LIMITED - RIGHT UPPER QUADRANT   COMPARISON:  None.  FINDINGS: Gallbladder:  Previous cholecystectomy  Common bile duct:  Diameter: Stent is identified within the common bile duct which measures up to 10 mm.  Liver:  There are multi focal anechoic liver lesions compatible with liver cysts. Within the right hepatic lobe there is a hypoechoic structure measuring 1.3 x 0.7 cm which is indeterminate.  IMPRESSION: 1. Previous cholecystectomy. 2. Increase caliber of the common bile duct with common bile duct stent in place. 3. Hypoechoic structure within the right hepatic lobe is indeterminate, can't does not have the appearance of a simple cyst. This may represent a focus of liver metastasis.   Electronically Signed   By: Kerby Moors M.D.   On: 01/29/2014 09:35    Microbiology: Recent Results (from the past 240 hour(s))  URINE CULTURE     Status: None   Collection Time    01/27/14  5:16 PM      Result Value Ref Range Status   Specimen Description URINE, CATHETERIZED   Final   Special Requests NONE   Final   Culture  Setup Time     Final   Value: 01/27/2014 21:40     Performed at [facility]     Final   Value: NO GROWTH     Performed at [facility]   Culture     Final   Value: NO GROWTH     Performed at [facility]   Report Status 01/28/2014 FINAL   Final     Labs: Basic Metabolic Panel:  Recent Labs Lab 01/31/14 0415 01/31/14 0515 02/01/14 0430 02/01/14 1835 02/02/14 0541 02/03/14 0546 02/04/14 0540 02/04/2014 0600  NA 145  --  145 144 144 147 148* 149*  K 2.9*  --  3.6* 3.5* 3.2* 3.3* 2.6* 3.2*  CL 108  --  109 109 109 113* 110 111  CO2 22  --  22 23 23 23 25 26   GLUCOSE 66*  --  98 85 102* 156* 135* 107*  BUN 25*  --  24* 23 24* 23 22 23   CREATININE 1.23*  --  1.28* 1.29* 1.33* 1.38* 1.29* 1.21*  CALCIUM 9.5  --  9.4 9.5 9.2 8.5 8.7 8.6  MG  --  1.8  --  1.8  --   --   --   --   PHOS  --   --   --  2.7  --   --   --   --    Liver Function Tests:  Recent  Labs Lab 02/01/14 0430 02/02/14 0541 02/03/14 0546 02/04/14 0540 02/10/2014 0600  AST 76* 61* 47* 45* 43*  ALT 35 30 27 24 22   ALKPHOS 211* 198* 195* 207* 202*  BILITOT 1.1 1.1 0.9 0.9 1.0  PROT 6.1 5.8* 5.7* 5.8* 6.0  ALBUMIN 2.1* 1.9* 1.7* 1.8* 1.7*   No results found for this basename: LIPASE, AMYLASE,  in the last 168 hours  Recent Labs Lab 02/01/14 0430 02/02/14 0539 02/03/14 1255 02/04/14 0549 02/17/2014 0600  AMMONIA 104* 121* 114* 113* 118*   CBC:  Recent Labs Lab 02/01/14 0430 02/02/14 0541 02/03/14 0546 02/04/14 0540 03/03/2014 0600  WBC 12.6* 11.5* 12.3* 13.9* 16.3*  HGB 10.1* 10.0* 9.7* 10.6* 11.4*  HCT 30.9* 30.2* 29.7* 32.3* 35.2*  MCV 87.5 86.5 89.5 86.8 91.2  PLT 598* 626* 568* 634* 585*   Cardiac Enzymes: No results found for this basename: CKTOTAL, CKMB, CKMBINDEX, TROPONINI,  in the last 168 hours BNP: BNP (last 3 results)  Recent Labs  01/27/14 1527  PROBNP 1669.0*   CBG:  Recent Labs Lab 02/04/14 0005 02/04/14 0354 02/04/14 0724 02/04/14 1155 02/04/14 1701  GLUCAP 144* 126* 149* 158* 163*       Signed:  Jermesha Sottile  [facility] 02/23/2014, 1:30 PM

## 2014-02-05 NOTE — Progress Notes (Signed)
Inpatient [place] Rm 1321, C. [place]-[facility] RN Visit- M. Wynetta Emery, RN  Received request from [place], [facility] for GIP evaluation; Pt seen at bedside and information reviewed with [place] Director eligibility confirmed.  Related admission to [facility] GIP level of care- [facility] dx: Pancreatic Cancer (157.9); pt is a DNR code status  On visit pt moaning, grimacing RR=24; minimal verbal response; per family and staff pt will follow commands and squeeze hand appropriately to answer some questions; pt aware of family in the room    Pt requiring PRN Morphine and Ativan for symptom management-On visit, Staff RN Beverlee Nims initiating Morphine drip at 2 mg/hr; Scopolamine and Atropine orders in place for secretions. Per discussion with staff RN pt continued to require titration of medication and Morphine now at 3mg /hr.  Effectiveness of symptom management will be continuously re-evaluated by staff to achieve optimum comfort.  Pt's husband, daughter and brother at bedside discussed current medications and s/sx they are seeing; dtr and spouse feel pt is much more comfortable than earlier this morning; aware to notify staff of any concerns or changes. They are aware [facility] team will continue to follow daily with attending Dr Marin Olp  Please call [facility] @ [phone]-  with any hospice needs.   Thank you.  [name], RN  [facility]  Hospice Liaison  [phone])     [name]  ED Rm  [facility] RNVisit M. Wynetta Emery, RN Pt seen at ED * lying in stretcher alert * Pt's home medication list is on chart *

## 2014-02-05 NOTE — Progress Notes (Signed)
Despite being very aggressive with tube feeds, she really has never improved. The feeding tube was removed yesterday. I certainly do agree with this.  She still not talking. She has the hepatic encephalopathy. Her ammonia was 118.  She does have secretions in the pharynx. She is on atropine drops. She is on scopolamine patch. I will try a morphine nebulizer on her. She has some wheezing on exam.  She likely only has one or 2 days left. Her heart is quite erratic.  Her blood pressure is still doing okay. I don't see where to be too aggressive with managing this.  There is no need for any type of antibiotics at this point. I think she has a temperature, it probably more related to her end-of-life condition and the "alteration" of her hypothalamic region.  I spoke with her family. They understand that she will pass quickly. There is no need to transfer her. Again I think she only has one or 2 days left.  if necessary her admission status can changed.  She has tried as much as possible. I just don't believe that she has anything left to give [place]. She is very much done better than I would've thought.  I very much appreciate a great care in that she is receiving from the hospitalist and the staff on 3 E.!!  Pete E.  Acts 16:31

## 2014-02-06 ENCOUNTER — Encounter (HOSPITAL_COMMUNITY): Payer: Self-pay

## 2014-02-06 DIAGNOSIS — R7989 Other specified abnormal findings of blood chemistry: Secondary | ICD-10-CM

## 2014-02-06 DIAGNOSIS — M7989 Other specified soft tissue disorders: Secondary | ICD-10-CM

## 2014-02-06 DIAGNOSIS — K7682 Hepatic encephalopathy: Secondary | ICD-10-CM

## 2014-02-06 DIAGNOSIS — D649 Anemia, unspecified: Secondary | ICD-10-CM

## 2014-02-06 DIAGNOSIS — K7290 Hepatic failure, unspecified without coma: Secondary | ICD-10-CM

## 2014-02-06 DIAGNOSIS — K769 Liver disease, unspecified: Secondary | ICD-10-CM

## 2014-02-06 LAB — COMPREHENSIVE METABOLIC PANEL
ALK PHOS: 182 U/L — AB (ref 39–117)
ALT: 19 U/L (ref 0–35)
AST: 39 U/L — ABNORMAL HIGH (ref 0–37)
Albumin: 1.6 g/dL — ABNORMAL LOW (ref 3.5–5.2)
BUN: 28 mg/dL — ABNORMAL HIGH (ref 6–23)
CALCIUM: 8.8 mg/dL (ref 8.4–10.5)
CO2: 27 mEq/L (ref 19–32)
CREATININE: 1.17 mg/dL — AB (ref 0.50–1.10)
Chloride: 112 mEq/L (ref 96–112)
GFR calc non Af Amer: 44 mL/min — ABNORMAL LOW (ref >90)
GFR, EST AFRICAN AMERICAN: 51 mL/min — AB (ref >90)
GLUCOSE: 85 mg/dL (ref 70–99)
Potassium: 3.3 mEq/L — ABNORMAL LOW (ref 3.7–5.3)
Sodium: 149 mEq/L — ABNORMAL HIGH (ref 137–147)
TOTAL PROTEIN: 5.5 g/dL — AB (ref 6.0–8.3)
Total Bilirubin: 0.7 mg/dL (ref 0.3–1.2)

## 2014-02-06 LAB — CBC
HEMATOCRIT: 35 % — AB (ref 36.0–46.0)
Hemoglobin: 11.3 g/dL — ABNORMAL LOW (ref 12.0–15.0)
MCH: 29.4 pg (ref 26.0–34.0)
MCHC: 32.3 g/dL (ref 30.0–36.0)
MCV: 90.9 fL (ref 78.0–100.0)
Platelets: 487 10*3/uL — ABNORMAL HIGH (ref 150–400)
RBC: 3.85 MIL/uL — ABNORMAL LOW (ref 3.87–5.11)
RDW: 26.9 % — ABNORMAL HIGH (ref 11.5–15.5)
WBC: 16 10*3/uL — ABNORMAL HIGH (ref 4.0–10.5)

## 2014-02-06 MED ORDER — MORPHINE (PF) INJECTION FOR INHALATION 10 MG/ML
10.0000 mg | RESPIRATORY_TRACT | Status: DC | PRN
  Administered 2014-02-06: 10 mg via RESPIRATORY_TRACT
  Filled 2014-02-06: qty 1

## 2014-02-06 MED ORDER — ATROPINE SULFATE 1 % OP SOLN
2.0000 [drp] | Freq: Four times a day (QID) | OPHTHALMIC | Status: DC
  Administered 2014-02-06 (×2): 2 [drp] via SUBLINGUAL
  Filled 2014-02-06: qty 2

## 2014-02-06 MED ORDER — MORPHINE SULFATE 10 MG/ML IJ SOLN: 10.0000 mg | INTRAMUSCULAR | Status: DC | PRN

## 2014-02-06 MED ORDER — MORPHINE BOLUS VIA INFUSION
4.0000 mg | INTRAVENOUS | Status: DC | PRN
  Administered 2014-02-06 – 2014-02-07 (×4): 4 mg via INTRAVENOUS
  Filled 2014-02-06: qty 4

## 2014-02-06 NOTE — Progress Notes (Signed)
[facility] Social Work Note (late entry from noon today)  Related admission to [facility] GIP level of care: Pancreatic Cancer  Met briefly with spouse at bedside to offer support and assess needs. Eight other family members and friends present. Spouse pleased with care, symptom management, and quiet and respectful atmosphere in room. Family familiar with hospice services including bereavement support. Will continue to follow, assess daily. Spoke with RN Joelene Millin re plan of care. Please do not hesitate to contact me with support to this family. Thank you. Erling Conte LCSW [phone]

## 2014-02-06 NOTE — Progress Notes (Signed)
Pt's husband, daughter, brother, sister, nephews, niece were all bedside during our visit. Pt's family expressed their strong faith and acceptance of God's will. Pt's daughter asked that I pray with the family. Pt's husband said they have been married 39 years. Pt's daughter also expressed how her uncle had survived a heart attach, she said she is a five year breast cancer survivor and her aunt a 94 year breast cancer survivor.  Even though they are survivors they have accepted what is happening to their loved one now. The pt's family was very pleasant and respectful to pt's condition. Pt's family was very thankful for prayer and visit.  Ernest Haber Chaplain  02/06/14 1100  Clinical Encounter Type  Visited With Family

## 2014-02-06 NOTE — Progress Notes (Signed)
Carolyn [name] is comfortable. She's on a morphine infusion now. She's at 2 mg an hour.  She's not having as much secretions. There is no wheezing. She has had a morphine nebulizer.  She's not talking. She does moan on occasion.  She's had some swelling in the hands and feet. This is chronic. I told her family that this was not going to bother her.  She's had no bleeding. She's had no obvious seizure activity.  I told her family that I thought she might have 2 days. I told him to watch out for a change in her breathing pattern.  When I examined her, her blood pressure was 153/57. Her temperature is 99.1. Her cardiac exam was somewhat irregular. Her lungs were clear. No wheezes were noted. Abdomen soft. No ascites. Extremities shows a 1 having 2+ edema.   On her labs, her BUN is 28 creatinine 1.17. Potassium 3.3.  At this point, we will hold on any further lab work.  She is strict comfort care. She's on a morphine infusion.  I still think she only has maybe 2 days ago.  Family is very appreciative of the excellent care that she is getting by the staff on 3 E.!!!  Pete E.  2 Timothy 4:16-18

## 2014-02-06 NOTE — Progress Notes (Signed)
Pt's family requesting Atropine gtts and Morphine nebulize for patient.  Meds not on MAR when pt switched to GIP. MD on call notified. Orders received.

## 2014-02-06 NOTE — Progress Notes (Signed)
Inpatient [place] Rm 1321, C. Eng[place]-[facility] RN Visit- M. Wynetta Emery, RN    Related admission to [facility] GIP level of care, dx: Pancreatic Cancer (157.9); pt is a DNR code status  Pt seen at bedside, non-verbal, dtr reports some moaning to touch, respirations even, RR=20-22 with some audible upper airway secretions (Scopolamine patch in place); generalized upper and lower extremity edema; foley with dark urine; on visit pt appears comfortable, no grimacing or moaning Per chart review pt has required IV Ativan boluses x4 in the last 14 hours and Morphine drip was titrated to comfort through the day/evenign and currently IV Morphine rate is 5 mg/hr continuous. - Effectiveness of symptom management continues to be evaluated by staff to achieve optimum comfort.  Pt's daughter Mariann Laster and husband Joe at bedside, stated they spent the night and spoke with Dr Marin Olp this morning; -Mariann Laster shared pt was intermittently restless through the night and she observed the Ativan seemed to help. She also shared she asked the staff yesterday to not turn her mom as she felt she was finally comfortable after adjusting medications, however, Mariann Laster noted this morning while she applied lotion to her mom's legs, Ms Matsumura began to [place] and mumble; Mariann Laster did not feel her mother was in a lot of discomfort as the mumbling stopped when she stopped rubbing her legs - Mariann Laster did ask that if the staff turn her today to do any care they assess if she would need something for comfort prior to this repositioning/personal care  - Writer assured Mariann Laster that the staff RN would monitor this and that current PRN medication could be used or they would contact Dr Marin Olp for additional orders if needed; also encouraged Mariann Laster to notify staff of any concerns or changes- she voiced understanding. Mariann Laster shared she and her died were "tired but would be nowhere else"; she voiced 'the timing is not in our hands but we are so glad she  is comfortable' emotional support offered. Writer spoke with Pincus Badder RN to inform of above discussion.    [facility] team continues to follow daily with attending Dr Marin Olp   Please call [facility] @ [phone]- with any hospice needs.  [name], [facility] Liaison [phone])

## 2014-02-07 ENCOUNTER — Ambulatory Visit: Payer: Medicare Other | Admitting: Hematology & Oncology

## 2014-02-07 ENCOUNTER — Ambulatory Visit: Payer: Medicare Other

## 2014-02-07 ENCOUNTER — Other Ambulatory Visit: Payer: Medicare Other | Admitting: Lab

## 2014-02-07 MED ORDER — SODIUM CHLORIDE 0.9 % IV SOLN: 1.0000 mg/h | INTRAVENOUS | Status: DC

## 2014-02-07 MED ORDER — SCOPOLAMINE 1 MG/3DAYS TD PT72: 2.0000 | MEDICATED_PATCH | TRANSDERMAL | Status: DC

## 2014-02-07 MED ORDER — ATROPINE SULFATE 1 % OP SOLN
2.0000 [drp] | Freq: Every day | OPHTHALMIC | Status: DC
Start: 2014-02-07 — End: 2014-02-10
  Administered 2014-02-07 – 2014-02-09 (×15): 2 [drp] via SUBLINGUAL
  Filled 2014-02-07: qty 2

## 2014-02-07 MED ORDER — SCOPOLAMINE 1 MG/3DAYS TD PT72
2.0000 | MEDICATED_PATCH | TRANSDERMAL | Status: DC
  Administered 2014-02-07: 3 mg via TRANSDERMAL
  Filled 2014-02-07: qty 2

## 2014-02-07 MED ORDER — SODIUM CHLORIDE 0.9 % IV SOLN
1.0000 mg/h | INTRAVENOUS | Status: DC
  Administered 2014-02-07: 1 mg/h via INTRAVENOUS
  Filled 2014-02-07: qty 10

## 2014-02-07 NOTE — Progress Notes (Signed)
Carolyn [name] is comfortable. Breathing is more shallow. Her heart is more irregular. She seems to have a little more secretions. We will increase the frequency of atropine drops. I also will put onto scopolamine patches.  She is at 5 mg hour of the morphine infusion. She's getting Ativan as needed.  Her oxygen saturation is 91%.  She's had a low-grade temperature. I think this is all part of the temperature control center of the brain beginning to shut down.  On her exam, her lungs show decreased breath sounds. Cardiac exam irregular rate and rhythm. Abdomen is soft. It is non-distended.  It would not surprise that she passed on today. Her family is ready for her to go. Her brother came in.  As always, I had a very nice talk with family. They're very appreciative of the great care to she's gotten by the staff on 3 E.  Lum Keas  2 Timothy 4:16-18

## 2014-02-07 NOTE — Progress Notes (Signed)
Inpatient [place] Rm 1321, C. [place]-[facility] RN Visit- M. Wynetta Emery, RN  Related admission to [facility] GIP level of care, dx: Pancreatic Cancer (157.9); pt is a DNR code status  Pt seen at bedside, non-verbal, not responsive to voice; increased audible upper airway secretions RR=18-20;  continued restlessness and signs of discomfort with repositioning per daughter - chart reviewed and spoke with staff RN Maudie Mercury who reports pt has required Ativan and Morphine boluses over night; noted medication adjustments Atropine drops now scheduled 6x/day and scopolamine patch increased to two patches; staff RN Maudie Mercury will re-assess for need to titrate Morphine drip for comfort given pt requiring bolus doses-currently Morphine drip at 5 mg/hr continuous Multiple family members at bedside, husband Joe voiced he fells pt is peaceful at this time, emotional support offered  [facility] team continues to follow daily with attending Dr Marin Olp  Please call [facility] @ [phone]- with any hospice needs.  [name], [facility] Liaison [phone])

## 2014-02-07 NOTE — Progress Notes (Signed)
20 ml of Morphine 5mg /ml concentration wasted in sink. Witnessed by Reyne Dumas, RN

## 2014-02-08 DIAGNOSIS — C259 Malignant neoplasm of pancreas, unspecified: Principal | ICD-10-CM

## 2014-02-08 NOTE — Progress Notes (Signed)
Carolyn [name] is still hanging in there. Her breathing is still shallow. She's starting to have some pauses. Her heart rate is slowing down below but. Her heart rhythm is still attic.  She has less secretions. There is not as much "rattling." Her morphine drip a total of 5 mg hour. I do not think that she has required any boluses. She has been getting Ativan but not much.  There really is no change in her physical exam.  Again, I would think that she will not make it through today and our be shocked if she made it to the weekend. Her family is doing well. They are staying with her. Again they're very appreciative of the great care and capacity care that the staff on 3E is showing their mom.  Pete E.  2 Timothy 2:10

## 2014-02-08 NOTE — Consult Note (Signed)
[facility] Social Work Note  Brief visit with spouse and daughter at bedside with many others present. They confirm they have what they need and patient is comfortable at this time. They are appreciative of support and maintaining quiet in the room. Thank you. Erling Conte LCSW [phone]

## 2014-02-08 NOTE — Progress Notes (Signed)
Inpatient [place] Rm 1321, C. [place]-[facility] RN Visit- M. Wynetta Emery, RN  Related admission to [facility] GIP level of care, dx: Pancreatic Cancer (157.9); pt is a DNR code status   Pt seen at bedside, non-verbal, not responsive to voice; face more drawn, jaw slack, intermittent cough and continued increased audible upper airway secretions, shallow respirations, RR=20 intermittent 5 second pauses; O2 sat 88% on RA; attempted oral suctioning at family request secretions audible but not able to be reached with oral suctioning; pt did not show any signs of discomfort with suctioning, no gag reflex noted Chart reviewed -PRN Ativan and Morphine bolus infusions continue to be needed for s/sx discomfort with repositioning Spoke with dtr Mariann Laster and multiple family members at bedside; pt had recently received PRN IV Ativan for increased agitation and dtr requesting pt be repositioned a little later - spoke with Staff RN Legrand Rams to request assessment for bolus dose and/or titration of Morphine drip at this time [place] visiting with family. [facility] team continues to follow daily with attending Dr Marin Olp  Please call [facility] @ [phone]- with any hospice needs.   [name], [facility] Liaison [phone])

## 2014-02-08 NOTE — Progress Notes (Signed)
Pt was resting peacefully when I arrived; husband, daughter, brother, niece and nephew were bedside. Pt's daughter was appropriately tearful today and said she has already said Happy Mother's Day and told her mom it is ok to go. Pt's daughter said she and brother and her dad (pt's husband) had good quality time last night. Pt's family has begun thinking/talking about celebration of life service when pt passes. They appreciated prayer and were appreciative of visit. If time allows I will follow-up later today or refer family to on-coming Chaplain for continued support. Ernest Haber Chaplain  02/08/14 1100  Clinical Encounter Type  Visited With Patient and family together

## 2014-02-10 NOTE — Progress Notes (Signed)
This RN witnessed 38 ml of morphine gtt wasted in sink.

## 2014-02-10 NOTE — Progress Notes (Signed)
Wasted 38 ml of morphine gtt (5 mg/ml in 100 ml NS) in sink. Witnessed by Demetrius Revel, RN

## 2014-02-12 ENCOUNTER — Encounter: Payer: Self-pay | Admitting: Nurse Practitioner

## 2014-02-12 NOTE — Progress Notes (Signed)
Received call from [place] that pt expired on 2014-03-03 @ 830pm.

## 2014-02-15 ENCOUNTER — Other Ambulatory Visit: Payer: Medicare Other | Admitting: Lab

## 2014-02-15 ENCOUNTER — Ambulatory Visit: Payer: Medicare Other

## 2014-02-22 ENCOUNTER — Other Ambulatory Visit: Payer: Medicare Other | Admitting: Lab

## 2014-02-22 ENCOUNTER — Ambulatory Visit (HOSPITAL_BASED_OUTPATIENT_CLINIC_OR_DEPARTMENT_OTHER): Payer: Medicare Other

## 2014-03-05 NOTE — Progress Notes (Signed)
Pt's daughter and husband were bedside when I arrived. Pt was asleep. They were having lunch, but daughter spoke with me again about pt's celebration of life service. She said they were just "waiting on God."  Since they were having lunch, our visit was short. Will check on family again later this afternoon. Ernest Haber Chaplain  02/23/2014 1300  Clinical Encounter Type  Visited With Family

## 2014-03-05 NOTE — Progress Notes (Signed)
Carolyn [name] continues to came in. She still is unresponsive. Now having some temperature spikes. Her temperature was up to 103.1. She does have some periods of pauses with her breathing. Her blood pressure started to go down. Oxygen saturation also is trending downward.  She is comfortable. Morphine drip still at 5 mg an hour. She may have gotten 1 dose of Ativan yesterday.  There is no obvious dyspnea.  On her exam, her lungs show some scattered rhonchi. Cardiac exam is tachycardic but regular. I did have a difficult time trying to listen to the heartbeat. Abdomen is soft. Bowel sounds are quiet. Extremities shows the edema in her arms legs.  I am surprised that she still is with this. She does have some "caloric reserve" that she can rely on to some degree.  I think the temperature spikes are indicative of her internal thermostat being affected.  It is very possible that she may make it to Mother's Day.  Again, the family is very grateful for the wonderful care that she has received on [place]!!  [name] E.  1 Adarsh Mundorf 5:7

## 2014-03-05 NOTE — Progress Notes (Signed)
Paged and notified Dr Marin Olp of patient expiring.

## 2014-03-05 NOTE — Progress Notes (Signed)
Patient found without respirations and pulse. Patient confirmed DNR. Verified by Otho Bellows, RN.

## 2014-03-05 NOTE — Progress Notes (Signed)
Inpatient [place] Rm 1321, C. [place]-[facility] RN Visit- M. Wynetta Emery, RN  Related admission to [facility] GIP level of care, dx: Pancreatic Cancer (157.9); pt is a DNR code status   Pt seen at bedside, non-verbal, not responsive to voice; very moist audible upper airway secretions, dtr Mariann Laster and husband Joe at bedside they voiced concern r/t increasing congestion; discussed options of medications and suctioning they are agreeable to trying to suction pt with different catheter if possible;  discussed with staff RN Verline Lema and obtained small suction catheter and was able to suction copious amounts thick secretions, oral mouth care given; RR =22 with 5-10 second periods of apnea; O2 sat=70%; febrile this morning; discussed with staff RN Verline Lema need for bolus dosing of Morphine for respiratory issues and for comfort with repositioning; Daughter voiced awareness of changes as pt with continued decline at EOL; dtr also voiced wanting to know if pt's fever has come down and would welcome light sponge bath for pt if temperature remains up; family stated they do not want to use tylenol for fever at this time. Above discussed with Staff RN Verline Lema and Staff Aide    [facility] team continues to follow daily with attending Dr Marin Olp  Please call [place] at [phone] with any hospice needs [name], [facility] Liaison [phone])

## 2014-03-05 DEATH — deceased

## 2014-03-06 MED ORDER — CYANOCOBALAMIN 1000 MCG/ML IJ SOLN
INTRAMUSCULAR | Status: AC
  Filled 2014-03-06: qty 1

## 2014-03-27 NOTE — Discharge Summary (Deleted)
NAME:  LACRESHA, FUSILIER NO.:  [id number]  MEDICAL RECORD NO.:  [id number]  LOCATION:                               FACILITY:  [facility]  PHYSICIAN:  Volanda Napoleon, M.D.  DATE OF BIRTH:  [birth year]  DATE OF ADMISSION:  02/18/2014 DATE OF DISCHARGE:  02/10/2014                              DISCHARGE SUMMARY   DEATH SUMMARY  DIAGNOSES UPON DEATH: 1. Metastatic pancreatic cancer. 2. Hepatic failure with hyperammonemia. 3. Protein-calorie malnutrition.  HOSPITAL COURSE:  Ms. Kohli was admitted on January 27, 2014.  She is a 78 year old, African American female.  She had metastatic pancreatic cancer.  She had been on chemotherapy with single agent gemcitabine. She was having some difficulties with this.  She was subsequently admitted on January 27, 2014.  She was getting weaker.  She was eating less.  She had been feeling more lethargic.  She has biliary stent, and because of obstructive jaundice when she initially presented.  She had been on some pain medication.  She was on some MSIR.  She had a hemoglobin of 6.6, when she was admitted.  Platelet count was 73.  She had alkaline phosphatase of 199.  AST was 109, ALT 50.  She was admitted.  She was given 2 units of blood.  She has been put on some IV hydration.  Her performance status had declined significantly.  Her last chemotherapy before admission was about 10 days prior.  She did have CT scan of the brain on admission which was unremarkable.  She had blood cultures done.  I think she was started on some IV antibiotics.  She still was somewhat lethargic.  She really was not getting out of bed.  She was not eating.  We were talking to the family. Her ammonia level was quite high, it was 88 on the 27th.  Now ammonia was 105.  She again was not eating.  This I think was her biggest problem.  Without nutrition, I thought that she would be declined very quickly.  She did have a follow up CT scan of the abdomen.   There is no obvious metastatic disease noted on a scan, but I really thought that she had metastatic disease and that she had liver involvement because of the elevated ammonia.  I could not do any other reason for her to have the elevated ammonia.  Her electrolytes were corrected accordingly.  Her blood sugars were on the lower side, which suggested hepatic dysfunction.  We talked to the family at length.  I thought if we are ever going to try to help that tube feeds would be necessary.  She did have the tube feed placed.  This was a very difficult decision for her husband.  Ms. Peaster was in no condition to make that decision. Her daughter, who came down from [place], did agree.  The tube was placed.  She was started on Osmolite 1.2.  Her ammonia still was quite elevated.  We put a lactulose down the tube. We also tried rifaximin.  This was all to try to help with her ammonia coming down.  She still did not improve all that much.  Her tube feeds were up to 60 mL an hour.  Again, we were trying to be aggressive to try to help her if possible. She had a tube eventually removed on Feb 04, 2014.  It was just making her very uncomfortable.  She had some hepatic encephalopathy because of the ammonia.  She had very low responsiveness.  She had increased secretions.  She had some shortness of breath.  She had some atropine drops placed.  She was given a scopolamine patch.  She continued to decline.  She was a do not resuscitate.  The family made this request.  They knew that she would not want to be kept alive on machines.  She continued to decline.  She was then subsequently started on a morphine infusion.  This helped with some of her discomfort. She continued to decline.  We began to withdraw medicines that were not necessary for her.  She had the morphine infusion titrated upward.  She was seen on the morning of the March 07, 2014.  I told the family that I thought that she  might make it through the weekend.  It was Mother's Day, coming up.  She subsequently passed peacefully on the evening of 03/07/14.  She was, I think, pronounced at 8:40 p.m.  Her family was with her.  Her family is very appreciable of all the great care that she received.     Volanda Napoleon, M.D.     PRE/MEDQ  D:  03/27/2014  T:  03/27/2014  Job:  [id number]

## 2014-03-27 NOTE — Discharge Summary (Signed)
NAME:  Carolyn [name], Carolyn [name] NO.:  [id number]  MEDICAL RECORD NO.:  [id number]  LOCATION:                               FACILITY:  [facility]  PHYSICIAN:  Volanda Napoleon, M.D.  DATE OF BIRTH:  [birth year]  DATE OF ADMISSION:  02/04/2014 DATE OF DISCHARGE:  02/10/2014                              DISCHARGE SUMMARY   DEATH SUMMARY  DIAGNOSES UPON DEATH: 1. Metastatic pancreatic cancer. 2. Hepatic failure with hyperammonemia. 3. Protein-calorie malnutrition.  HOSPITAL COURSE:  Carolyn [name] was admitted on January 27, 2014.  She is a 78 year old, African American female.  She had metastatic pancreatic cancer.  She had been on chemotherapy with single agent gemcitabine. She was having some difficulties with this.  She was subsequently admitted on January 27, 2014.  She was getting weaker.  She was eating less.  She had been feeling more lethargic.  She has biliary stent, and because of obstructive jaundice when she initially presented.  She had been on some pain medication.  She was on some MSIR.  She had a hemoglobin of 6.6, when she was admitted.  Platelet count was 73.  She had alkaline phosphatase of 199.  AST was 109, ALT 50.  She was admitted.  She was given 2 units of blood.  She has been put on some IV hydration.  Her performance status had declined significantly.  Her last chemotherapy before admission was about 10 days prior.  She did have CT scan of the brain on admission which was unremarkable.  She had blood cultures done.  I think she was started on some IV antibiotics.  She still was somewhat lethargic.  She really was not getting out of bed.  She was not eating.  We were talking to the family. Her ammonia level was quite high, it was 88 on the 27th.  Now ammonia was 105.  She again was not eating.  This I think was her biggest problem.  Without nutrition, I thought that she would be declined very quickly.  She did have a follow up CT scan of the abdomen.   There is no obvious metastatic disease noted on a scan, but I really thought that she had metastatic disease and that she had liver involvement because of the elevated ammonia.  I could not do any other reason for her to have the elevated ammonia.  Her electrolytes were corrected accordingly.  Her blood sugars were on the lower side, which suggested hepatic dysfunction.  We talked to the family at length.  I thought if we are ever going to try to help that tube feeds would be necessary.  She did have the tube feed placed.  This was a very difficult decision for her husband.  Carolyn [name] was in no condition to make that decision. Her daughter, who came down from [place], did agree.  The tube was placed.  She was started on Osmolite 1.2.  Her ammonia still was quite elevated.  We put a lactulose down the tube. We also tried rifaximin.  This was all to try to help with her ammonia coming down.  She still did not improve all that much.  Her tube feeds were up to 60 mL an hour.  Again, we were trying to be aggressive to try to help her if possible. She had a tube eventually removed on Feb 04, 2014.  It was just making her very uncomfortable.  She had some hepatic encephalopathy because of the ammonia.  She had very low responsiveness.  She had increased secretions.  She had some shortness of breath.  She had some atropine drops placed.  She was given a scopolamine patch.  She continued to decline.  She was a do not resuscitate.  The family made this request.  They knew that she would not want to be kept alive on machines.  She continued to decline.  She was then subsequently started on a morphine infusion.  This helped with some of her discomfort. She continued to decline.  We began to withdraw medicines that were not necessary for her.  She had the morphine infusion titrated upward.  She was seen on the morning of the 02/10/14.  I told the family that I thought that she  might make it through the weekend.  It was Mother's Day, coming up.  She subsequently passed peacefully on the evening of 10-Feb-2014.  She was, I think, pronounced at 8:40 p.m.  Her family was with her.  Her family is very appreciable of all the great care that she received.     Volanda Napoleon, M.D.     PRE/MEDQ  D:  03/27/2014  T:  03/27/2014  Job:  [id number]

## 2014-04-07 IMAGING — CR DG CHEST 2V
3 series · 3 of 3 positions shown · non-contrast
Comparison: 07/02/2013

CLINICAL DATA: Shortness of breath. Abdominal pain. Acute
pancreatitis. History of pancreatic cancer.

EXAM:
CHEST  2 VIEW

[w chest lat (1 of 2)]
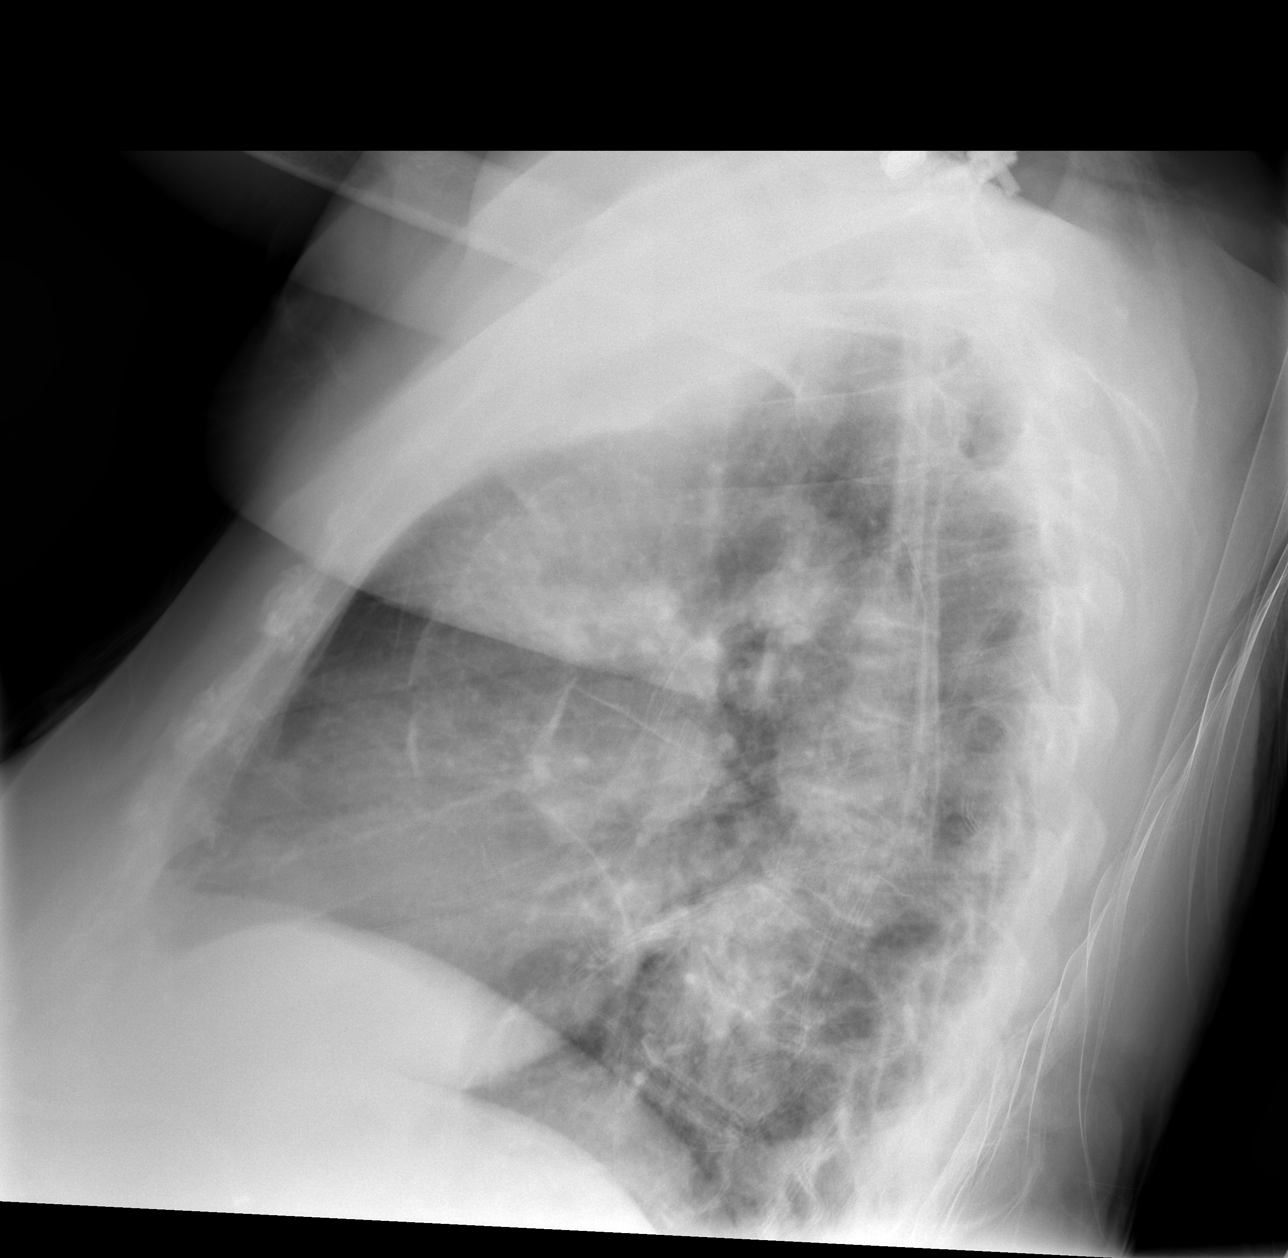

[w chest lat (2 of 2)]
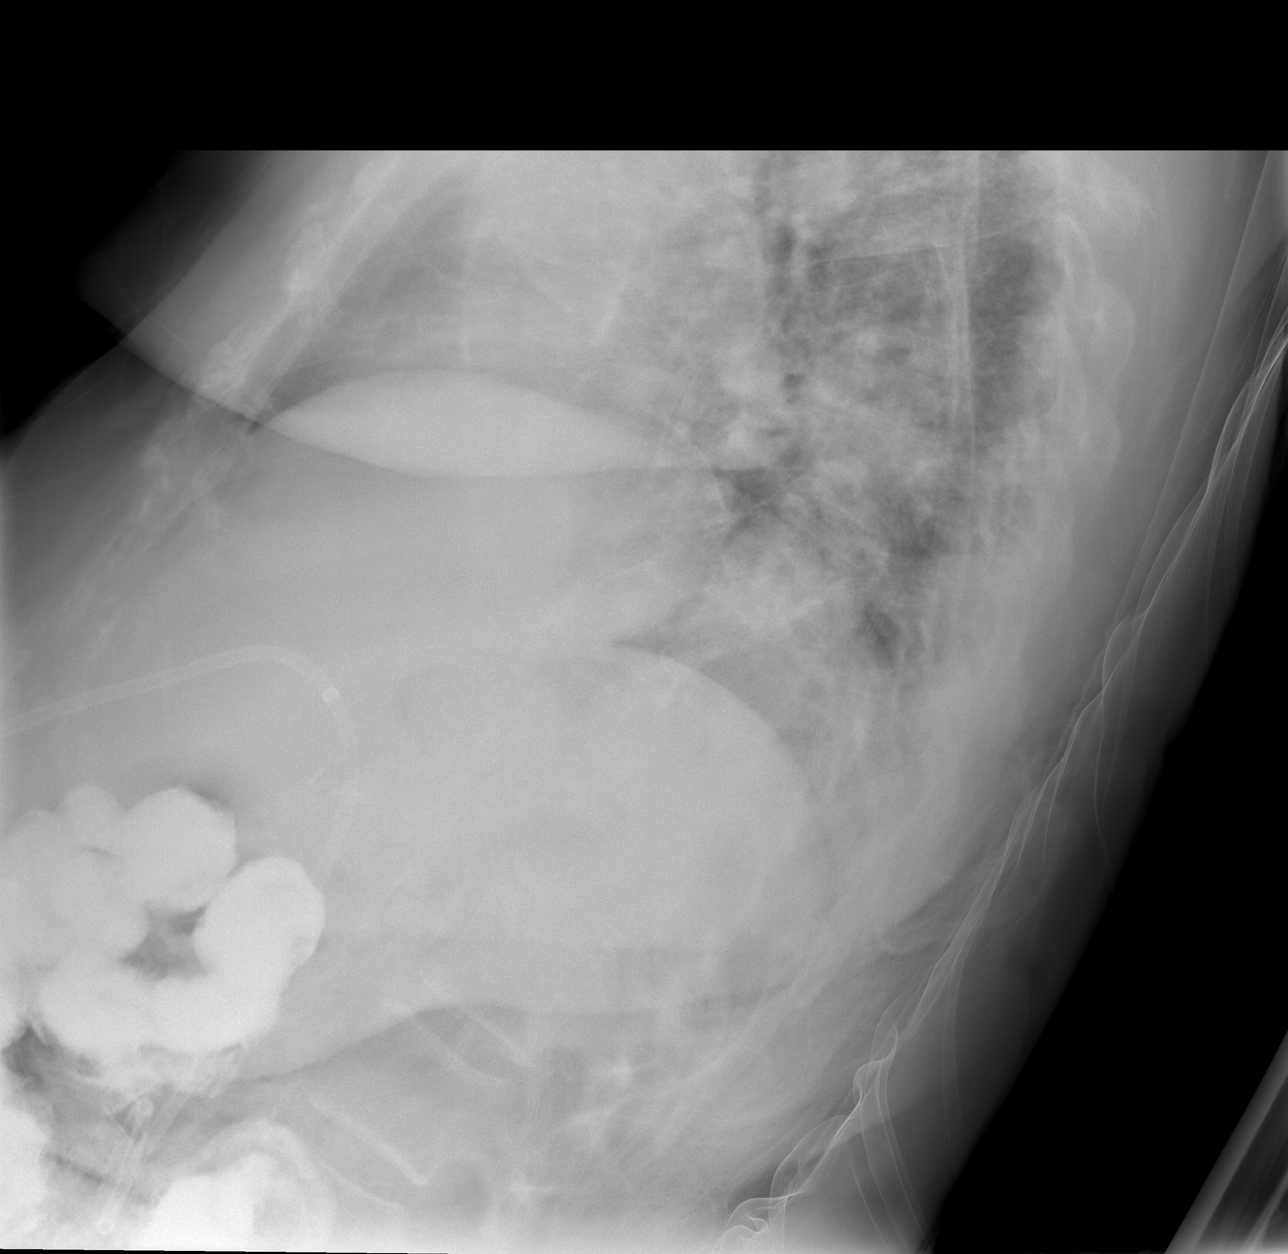

[view not recorded]
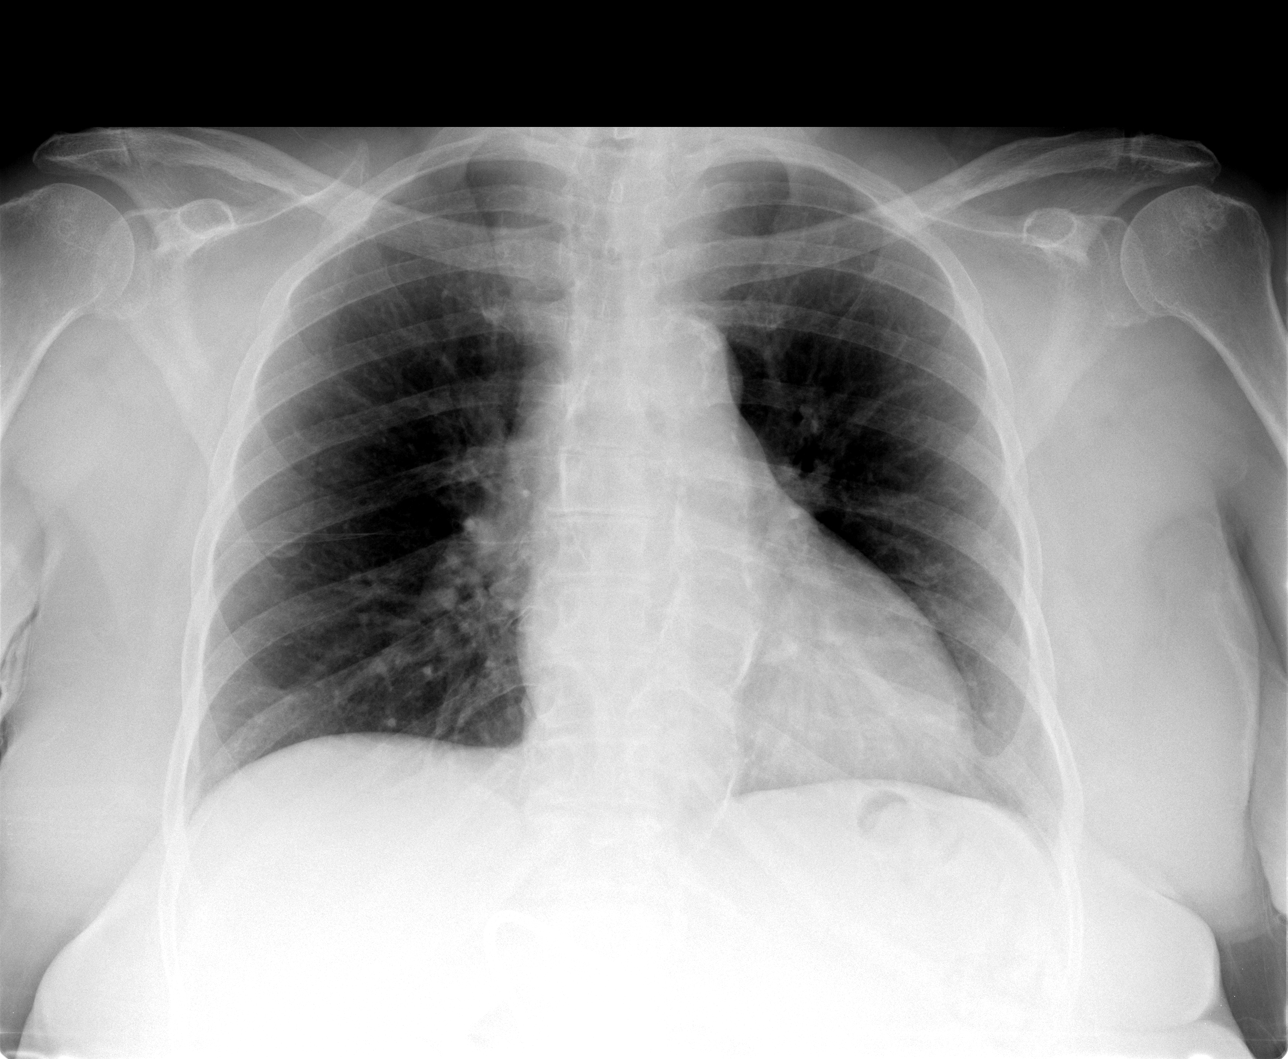

[3 of 3 positions shown; findings below may reference images not displayed]

FINDINGS: The heart size and mediastinal contours are within normal limits.
Both lungs are clear. The visualized skeletal structures are
unremarkable. Displacement of the trachea towards the right
consistent with thyroid goiter. Postoperative changes in the
cervical spine. Calcified and tortuous aorta. Degenerative changes
in the spine and shoulders. No significant change since previous
study.
IMPRESSION: No active cardiopulmonary disease.

## 2014-04-19 MED ORDER — CYANOCOBALAMIN 1000 MCG/ML IJ SOLN
INTRAMUSCULAR | Status: AC
  Filled 2014-04-19: qty 1

## 2014-05-29 ENCOUNTER — Other Ambulatory Visit: Payer: Self-pay | Admitting: Pharmacist

## 2014-10-15 IMAGING — CT CT ABD-PELV W/ CM
2 of 5 series · 16 of 46 positions shown, 18 images · IV contrast (OMNIPAQUE)
Comparison: 11/10/2013

CLINICAL DATA: Pancreatic cancer. Jaundice. Lethargy. Severe
abdominal pain.

EXAM:
CT ABDOMEN AND PELVIS WITH CONTRAST
TECHNIQUE: Multidetector CT imaging of the abdomen and pelvis was performed
using the standard protocol following bolus administration of
intravenous contrast.
CONTRAST:  100mL OMNIPAQUE IOHEXOL 300 MG/ML  SOLN

[Series 2: rtn a/p with · axial · 0.80mm/px · z∈[+701,+1086]mm · 13 of 87 slices shown, 15 images]
[im 5/87  soft-tissue]
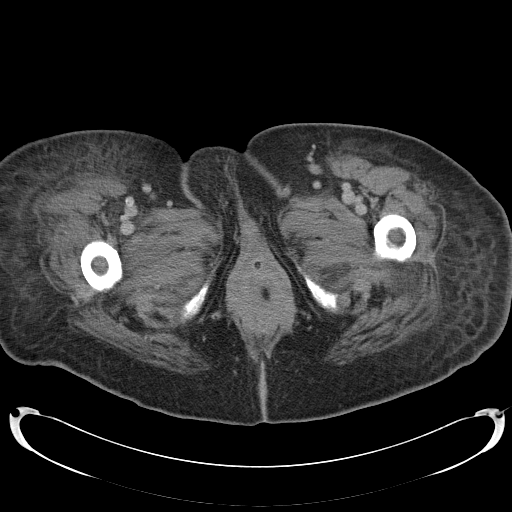
[im 5/87  bone]
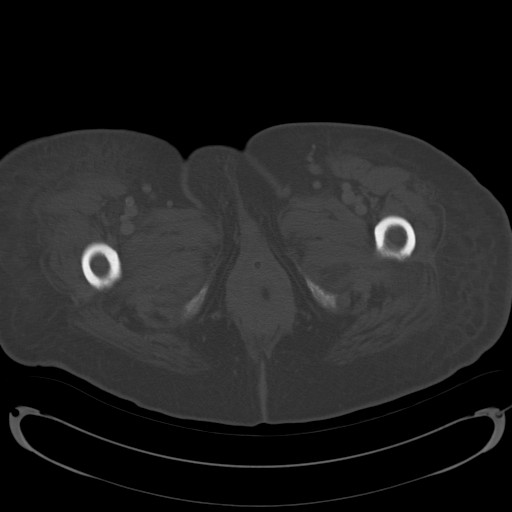
[im 13/87  soft-tissue]
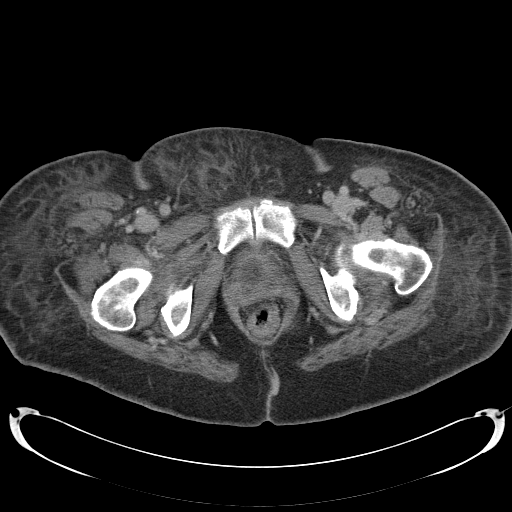
[im 17/87  soft-tissue]
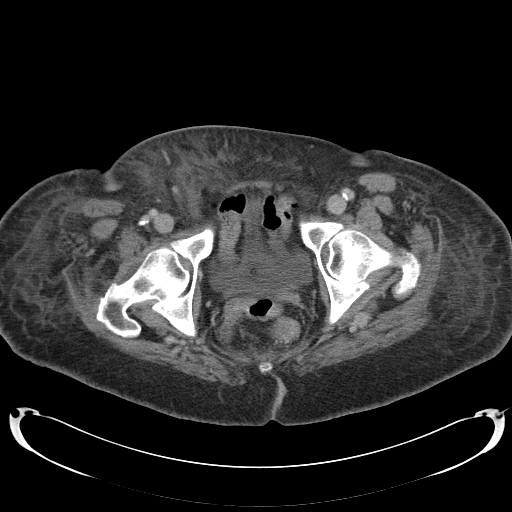
[im 25/87  soft-tissue]
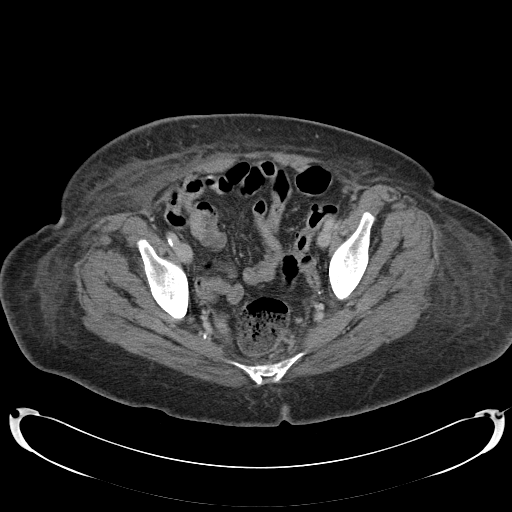
[im 29/87  soft-tissue]
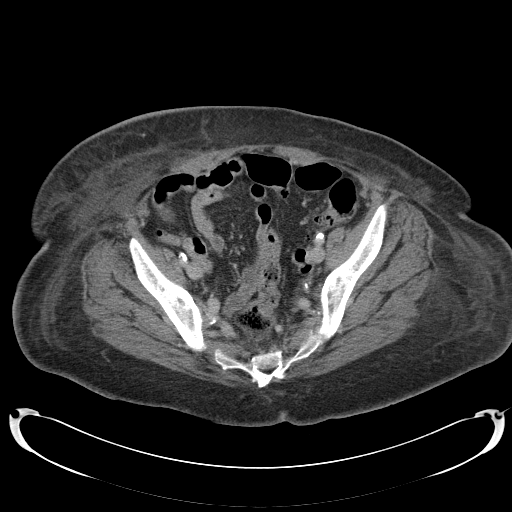
[im 37/87  soft-tissue]
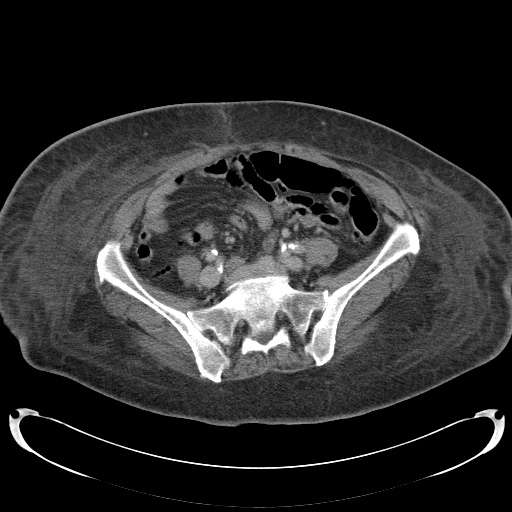
[im 46/87  soft-tissue]
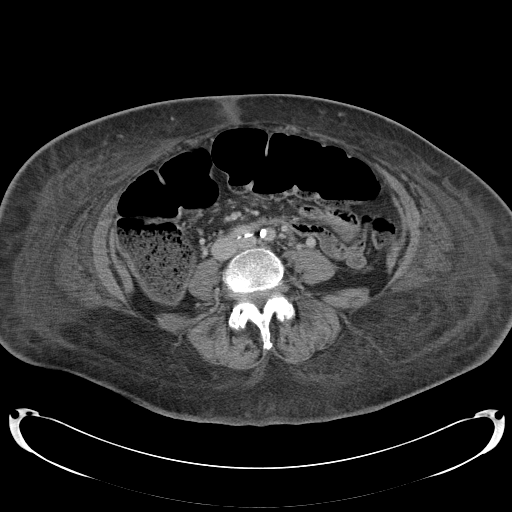
[im 50/87  soft-tissue]
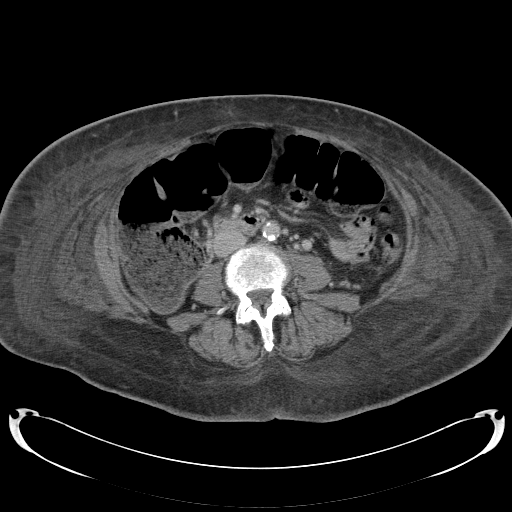
[im 58/87  soft-tissue]
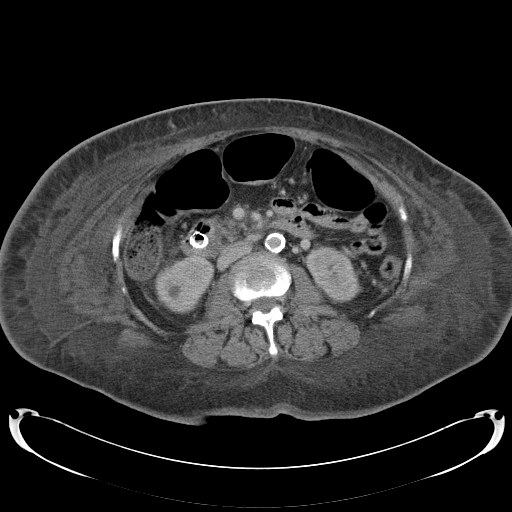
[im 58/87  bone]
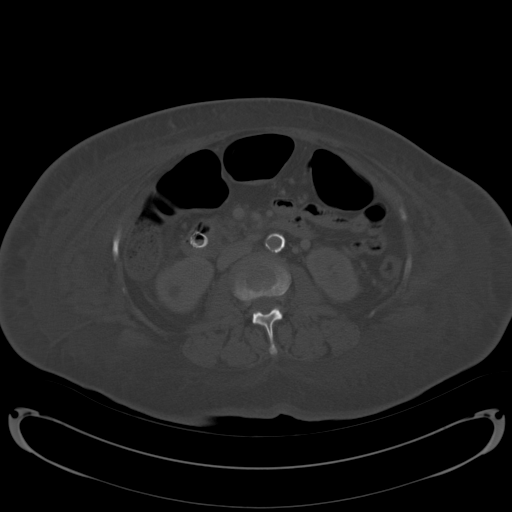
[im 62/87  soft-tissue]
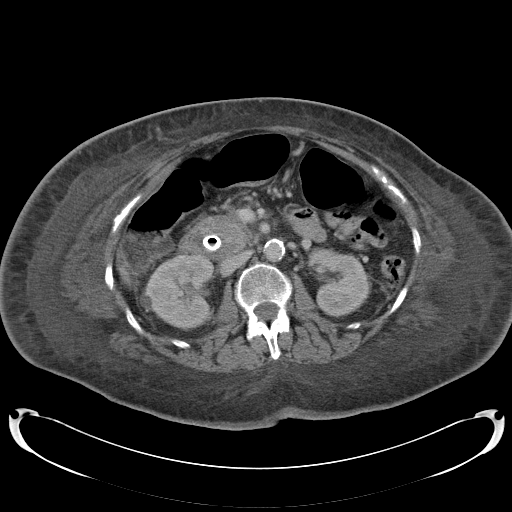
[im 70/87  soft-tissue]
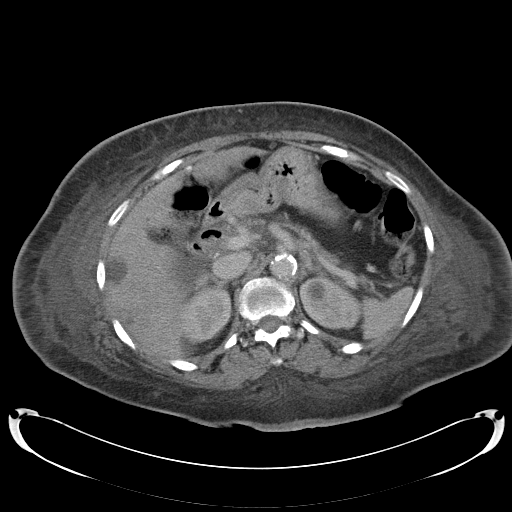
[im 74/87  soft-tissue]
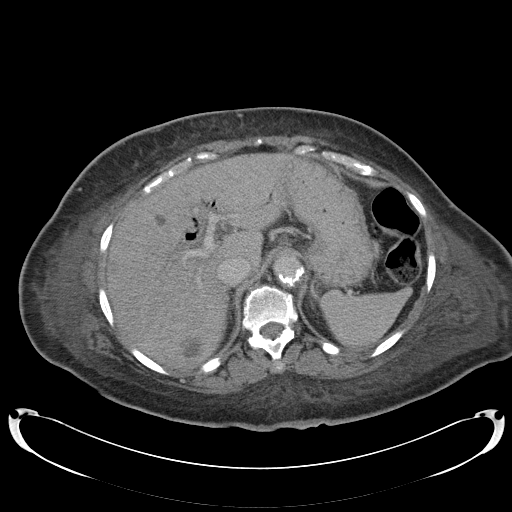
[im 82/87  soft-tissue]
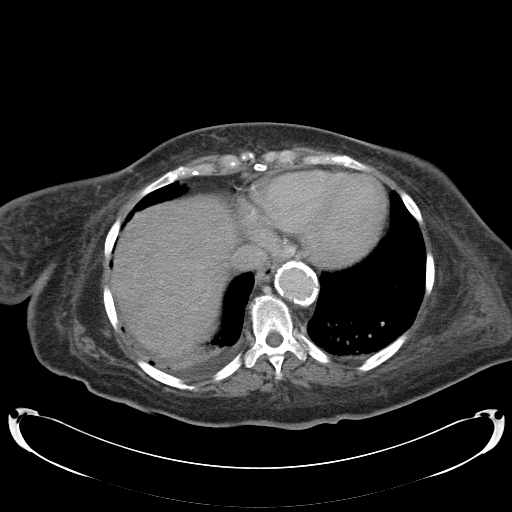

[Series 602: <mpr thick range> · coronal · 0.85mm/px · 3 of 84 slices shown]
[im 28/84  soft-tissue]
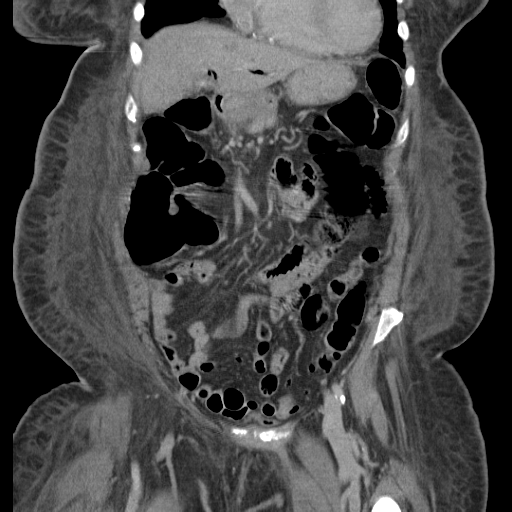
[im 37/84  soft-tissue]
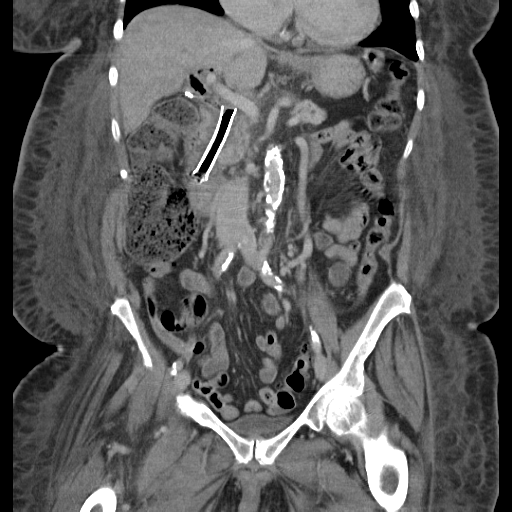
[im 47/84  soft-tissue]
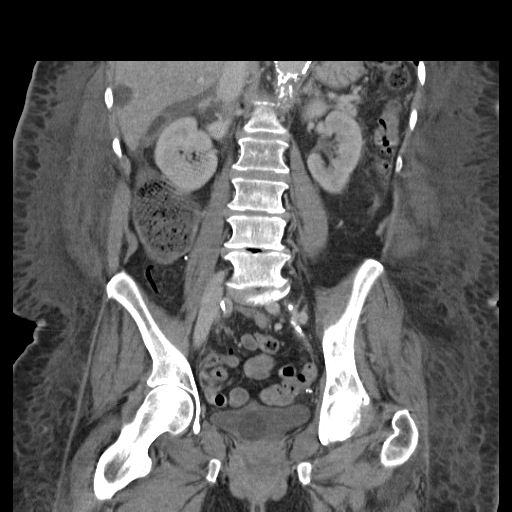

[16 of 46 positions shown; findings below may reference images not displayed]

FINDINGS: Lung bases show dependent atelectasis, right greater than left with
tiny right pleural effusion.

Multiple well-defined water density lesions scattered throughout the
liver parenchyma are stable. These are probably hepatic cysts.
Poorly defined area of low attenuation measuring approximately 2.7 x
1.7 cm in the posterior right liver is not qualitatively changed in
the interval. It is difficult to reproducibly measure given the
ill-defined margins.

Spleen is unchanged with a tiny low-density anterior lesion. Stomach
is unremarkable. Common bile duct stent remains in place. No
substantial biliary dilatation. Pneumobilia persists.

Dilatation of the main pancreatic duct has decreased in the
interval, measuring 9 mm today compared to 16 mm previously.
Pancreatic head remains poorly defined and the mass lesion is
difficult to identify given ill-defined margins and adjacent
structures. Measuring at the same level as on the previous study, it
measures 3.2 x 4.2 cm today compared to 2.9 x 4.3 cm previously.

The adrenal glands are unremarkable. Small cysts are evident in both
kidneys

Imaging through the pelvis shows no free intraperitoneal fluid.
Foley catheter decompresses the urinary bladder. No pelvic sidewall
lymphadenopathy. The uterus is surgically absent. No adnexal mass.
Prominent colonic stool noted. The terminal ileum and the appendix
are normal.

Diffuse body wall edema is evident.

Bone windows reveal no worrisome lytic or sclerotic osseous lesions.
IMPRESSION: No substantial interval change in the poorly defined pancreatic head
mass. The dilatation of the main pancreatic duct is decreased in the
interval. There is no associated biliary duct dilatation with common
bile duct stent in stable position.

Multiple hepatic cysts within ill-defined lesion in the posterior
right liver without substantial change. This ill-defined lesion may
represent metastatic disease and continued attention on follow-up
imaging recommended.

Bilateral renal cysts.

Atherosclerosis.

## 2014-10-16 IMAGING — CR DG ABDOMEN 1V
1 series · 2 of 2 positions shown · non-contrast
Comparison: 02/01/2014.

CLINICAL DATA: Evaluate nasogastric tube placement.

EXAM:
ABDOMEN - 1 VIEW

[Series 1: ap (kub) · U · 2 of 2 slices shown]
[im 1/2]
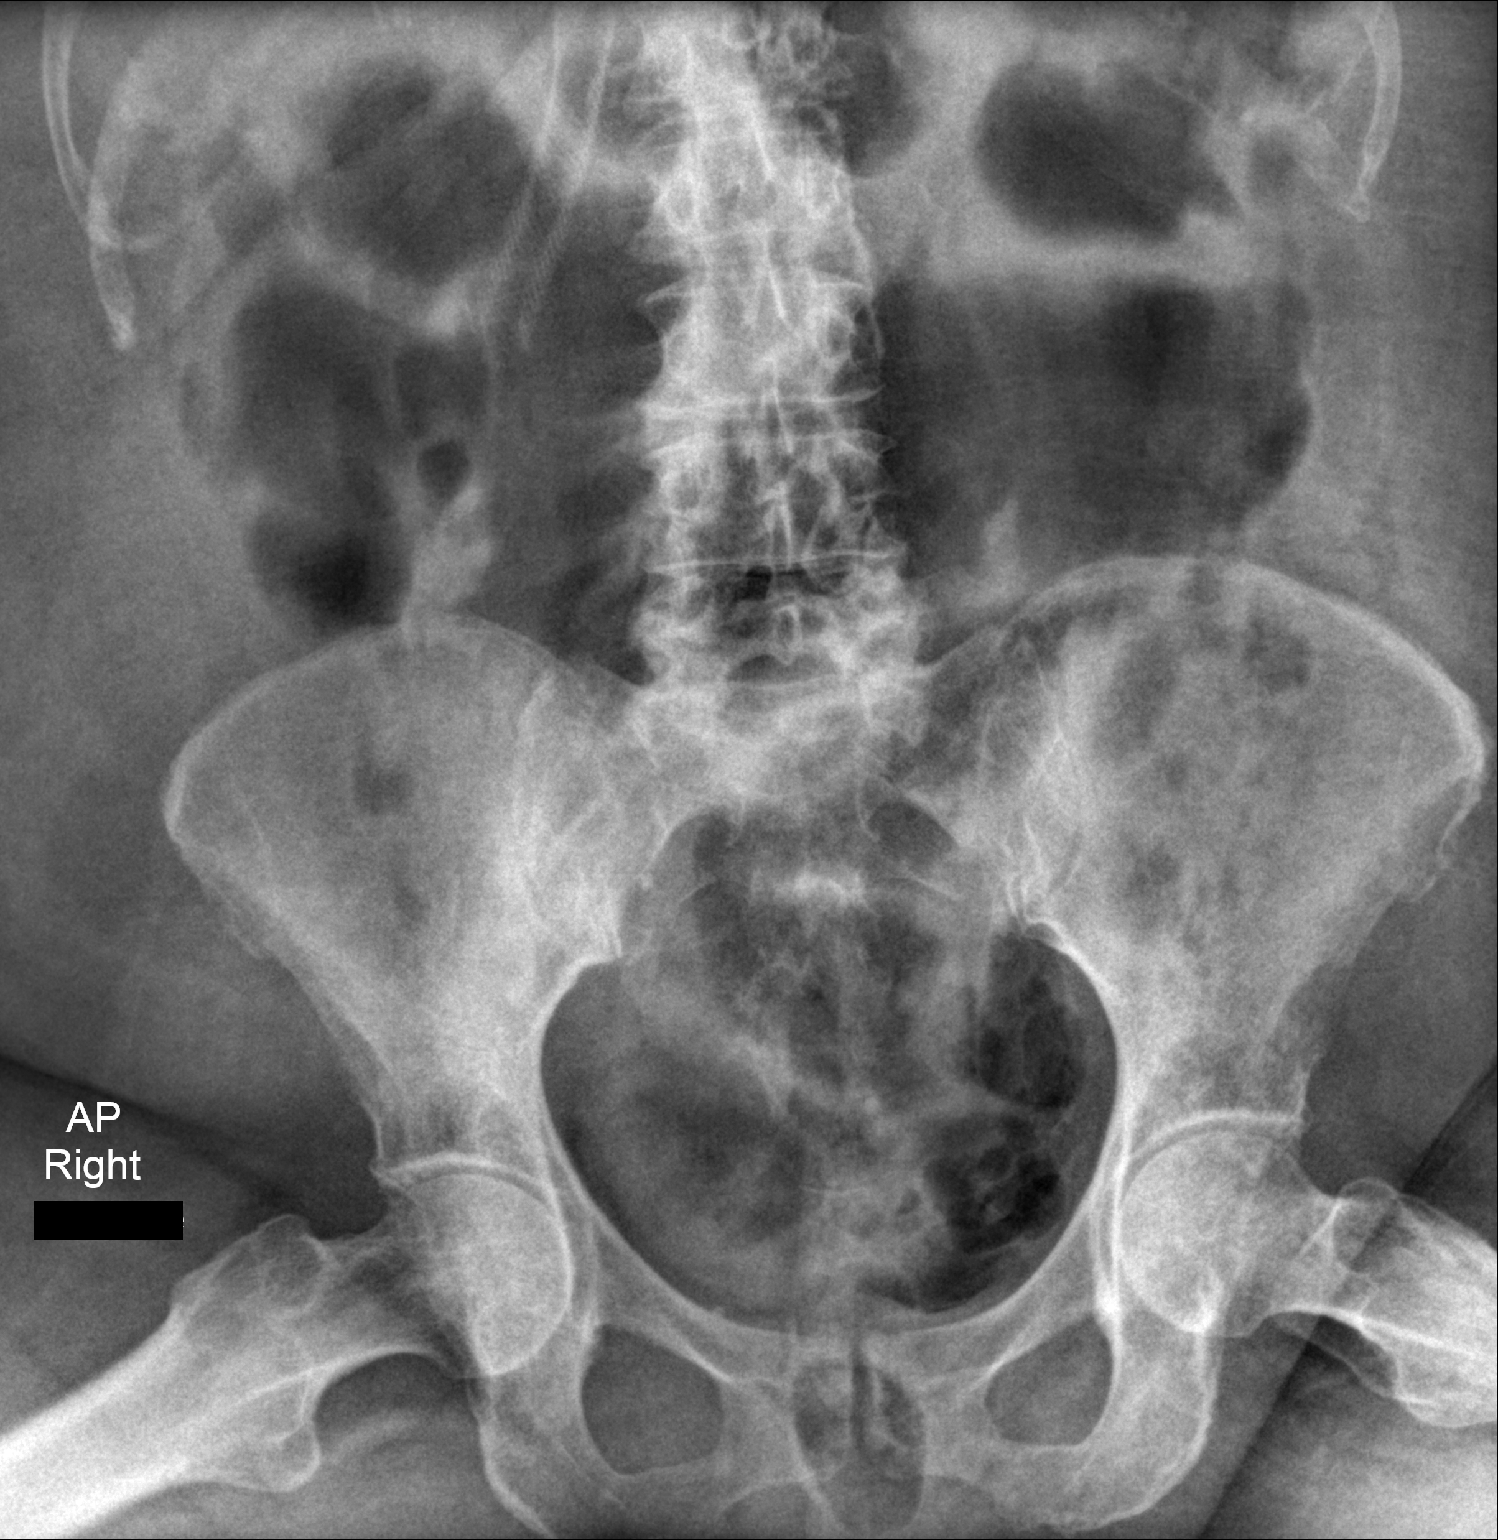
[im 2/2]
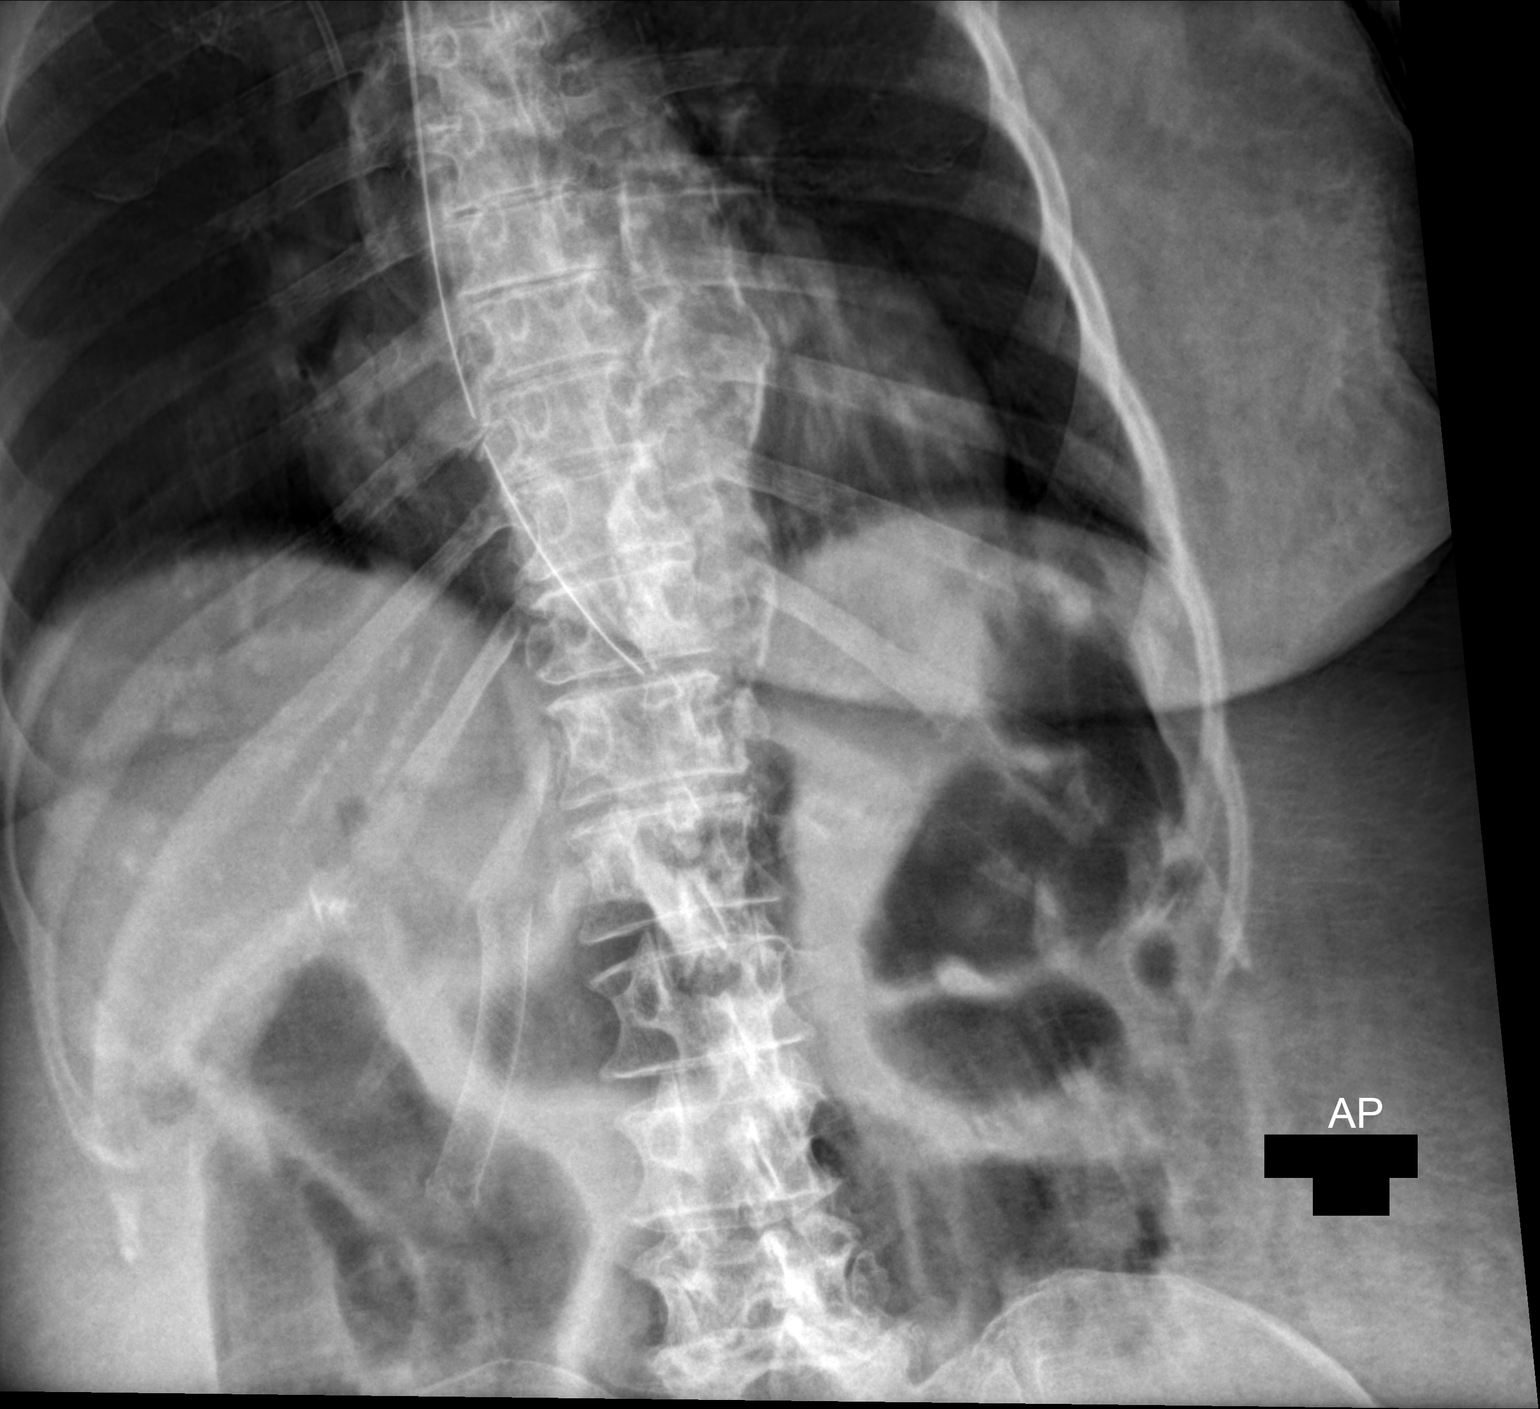

[2 of 2 positions shown; findings below may reference images not displayed]

FINDINGS: Two views of the abdomen demonstrate a nasogastric tube in position
with tip at the level of the gastroesophageal junction. Diffuse
dilatation of bowel is noted, however, there is extensive patient
motion which limits assessment, making discrimination between small
bowel and colon challenging. There is clearly distal colonic and
rectal gas, suggesting that this bowel dilatation is likely related
to ileus, rather than obstruction. A biliary stent is noted.
IMPRESSION: 1. Tip of nasogastric tube is at the gastroesophageal junction, and
could be advanced 10-15 cm for more optimal placement.
2. Additional findings, as above, similar to recent prior
examinations.

## 2014-11-05 ENCOUNTER — Encounter: Payer: Self-pay | Admitting: Gastroenterology

## 2021-06-19 ENCOUNTER — Encounter: Payer: Self-pay | Admitting: *Deleted

## 2021-06-19 NOTE — Telephone Encounter (Signed)
This encounter was created in error - please disregard.
